# Patient Record
Sex: Female | Born: 1950
Health system: Southern US, Community
[De-identification: ages and names within clinical notes are randomized; demographics above are authoritative.]

## PROBLEM LIST (undated history)

## (undated) DIAGNOSIS — K08409 Partial loss of teeth, unspecified cause, unspecified class: Secondary | ICD-10-CM

## (undated) DIAGNOSIS — E119 Type 2 diabetes mellitus without complications: Secondary | ICD-10-CM

## (undated) DIAGNOSIS — K759 Inflammatory liver disease, unspecified: Secondary | ICD-10-CM

## (undated) DIAGNOSIS — C439 Malignant melanoma of skin, unspecified: Secondary | ICD-10-CM

## (undated) DIAGNOSIS — I4891 Unspecified atrial fibrillation: Secondary | ICD-10-CM

## (undated) DIAGNOSIS — G4730 Sleep apnea, unspecified: Secondary | ICD-10-CM

## (undated) DIAGNOSIS — E782 Mixed hyperlipidemia: Secondary | ICD-10-CM

## (undated) DIAGNOSIS — I6529 Occlusion and stenosis of unspecified carotid artery: Secondary | ICD-10-CM

## (undated) DIAGNOSIS — B019 Varicella without complication: Secondary | ICD-10-CM

## (undated) DIAGNOSIS — Q309 Congenital malformation of nose, unspecified: Secondary | ICD-10-CM

## (undated) DIAGNOSIS — I10 Essential (primary) hypertension: Secondary | ICD-10-CM

## (undated) DIAGNOSIS — Q308 Other congenital malformations of nose: Secondary | ICD-10-CM

## (undated) DIAGNOSIS — M1990 Unspecified osteoarthritis, unspecified site: Secondary | ICD-10-CM

## (undated) DIAGNOSIS — E039 Hypothyroidism, unspecified: Secondary | ICD-10-CM

## (undated) DIAGNOSIS — I499 Cardiac arrhythmia, unspecified: Secondary | ICD-10-CM

## (undated) DIAGNOSIS — T4145XA Adverse effect of unspecified anesthetic, initial encounter: Secondary | ICD-10-CM

## (undated) DIAGNOSIS — T8859XA Other complications of anesthesia, initial encounter: Secondary | ICD-10-CM

## (undated) DIAGNOSIS — C801 Malignant (primary) neoplasm, unspecified: Secondary | ICD-10-CM

## (undated) HISTORY — DX: Mixed hyperlipidemia: E78.2

## (undated) HISTORY — DX: Varicella without complication: B01.9

## (undated) HISTORY — PX: FOOT SURGERY: SHX648

## (undated) HISTORY — DX: Occlusion and stenosis of unspecified carotid artery: I65.29

## (undated) HISTORY — DX: Unspecified atrial fibrillation: I48.91

## (undated) HISTORY — DX: Partial loss of teeth, unspecified cause, unspecified class: K08.409

## (undated) HISTORY — PX: ABDOMINAL WOUND DEHISCENCE: SHX540

## (undated) HISTORY — DX: Malignant melanoma of skin, unspecified: C43.9

## (undated) HISTORY — PX: COLON SURGERY: SHX602

## (undated) HISTORY — PX: MELANOMA EXCISION: SHX5266

---

## 1998-04-10 HISTORY — PX: CHOLECYSTECTOMY: SHX55

## 1998-05-26 ENCOUNTER — Other Ambulatory Visit: Admission: RE | Admit: 1998-05-26 | Discharge: 1998-05-26 | Payer: Self-pay | Admitting: Obstetrics & Gynecology

## 1999-05-02 ENCOUNTER — Other Ambulatory Visit: Admission: RE | Admit: 1999-05-02 | Discharge: 1999-05-02 | Payer: Self-pay | Admitting: Obstetrics & Gynecology

## 1999-12-12 DIAGNOSIS — C189 Malignant neoplasm of colon, unspecified: Secondary | ICD-10-CM

## 1999-12-12 HISTORY — PX: COLON RESECTION: SHX5231

## 1999-12-12 HISTORY — DX: Malignant neoplasm of colon, unspecified: C18.9

## 2000-08-22 ENCOUNTER — Other Ambulatory Visit: Admission: RE | Admit: 2000-08-22 | Discharge: 2000-08-22 | Payer: Self-pay | Admitting: Obstetrics & Gynecology

## 2000-09-10 DIAGNOSIS — D270 Benign neoplasm of right ovary: Secondary | ICD-10-CM

## 2000-09-10 HISTORY — PX: ABDOMINAL HYSTERECTOMY: SHX81

## 2000-09-10 HISTORY — DX: Benign neoplasm of right ovary: D27.0

## 2000-09-24 ENCOUNTER — Other Ambulatory Visit: Admission: RE | Admit: 2000-09-24 | Discharge: 2000-09-24 | Payer: Self-pay | Admitting: Obstetrics and Gynecology

## 2000-09-26 ENCOUNTER — Encounter: Payer: Self-pay | Admitting: Gynecology

## 2000-09-26 ENCOUNTER — Ambulatory Visit: Admission: RE | Admit: 2000-09-26 | Discharge: 2000-09-26 | Payer: Self-pay | Admitting: Gynecology

## 2000-10-02 ENCOUNTER — Inpatient Hospital Stay (HOSPITAL_COMMUNITY): Admission: RE | Admit: 2000-10-02 | Discharge: 2000-10-05 | Payer: Self-pay | Admitting: Gynecology

## 2003-07-20 ENCOUNTER — Encounter: Payer: Self-pay | Admitting: Orthopedic Surgery

## 2003-07-20 ENCOUNTER — Ambulatory Visit (HOSPITAL_COMMUNITY): Admission: RE | Admit: 2003-07-20 | Discharge: 2003-07-20 | Payer: Self-pay | Admitting: Orthopedic Surgery

## 2005-07-27 ENCOUNTER — Ambulatory Visit: Payer: Self-pay | Admitting: Internal Medicine

## 2006-08-06 ENCOUNTER — Ambulatory Visit: Payer: Self-pay | Admitting: Specialist

## 2006-08-09 ENCOUNTER — Other Ambulatory Visit: Payer: Self-pay

## 2006-08-11 HISTORY — PX: KNEE ARTHROSCOPY: SHX127

## 2006-08-23 ENCOUNTER — Ambulatory Visit: Payer: Self-pay | Admitting: Specialist

## 2008-12-08 ENCOUNTER — Ambulatory Visit: Payer: Self-pay | Admitting: General Practice

## 2009-11-12 ENCOUNTER — Ambulatory Visit: Payer: Self-pay | Admitting: Internal Medicine

## 2009-12-31 ENCOUNTER — Ambulatory Visit: Payer: Self-pay | Admitting: Internal Medicine

## 2010-01-07 ENCOUNTER — Ambulatory Visit: Payer: Self-pay | Admitting: Internal Medicine

## 2012-08-16 DIAGNOSIS — I059 Rheumatic mitral valve disease, unspecified: Secondary | ICD-10-CM | POA: Insufficient documentation

## 2012-12-11 HISTORY — PX: CARDIOVERSION: SHX1299

## 2014-07-15 DIAGNOSIS — C4359 Malignant melanoma of other part of trunk: Secondary | ICD-10-CM | POA: Diagnosis not present

## 2014-07-15 DIAGNOSIS — D485 Neoplasm of uncertain behavior of skin: Secondary | ICD-10-CM | POA: Diagnosis not present

## 2014-08-27 DIAGNOSIS — Z1231 Encounter for screening mammogram for malignant neoplasm of breast: Secondary | ICD-10-CM | POA: Diagnosis not present

## 2014-09-24 DIAGNOSIS — E782 Mixed hyperlipidemia: Secondary | ICD-10-CM | POA: Diagnosis not present

## 2014-09-24 DIAGNOSIS — E039 Hypothyroidism, unspecified: Secondary | ICD-10-CM | POA: Diagnosis not present

## 2014-09-24 DIAGNOSIS — E781 Pure hyperglyceridemia: Secondary | ICD-10-CM | POA: Diagnosis not present

## 2014-09-24 DIAGNOSIS — E1165 Type 2 diabetes mellitus with hyperglycemia: Secondary | ICD-10-CM | POA: Diagnosis not present

## 2014-09-24 DIAGNOSIS — I10 Essential (primary) hypertension: Secondary | ICD-10-CM | POA: Diagnosis not present

## 2014-10-08 DIAGNOSIS — Z23 Encounter for immunization: Secondary | ICD-10-CM | POA: Diagnosis not present

## 2014-10-12 DIAGNOSIS — Z23 Encounter for immunization: Secondary | ICD-10-CM | POA: Diagnosis not present

## 2014-11-25 DIAGNOSIS — H6092 Unspecified otitis externa, left ear: Secondary | ICD-10-CM | POA: Diagnosis not present

## 2014-11-25 DIAGNOSIS — Z1389 Encounter for screening for other disorder: Secondary | ICD-10-CM | POA: Diagnosis not present

## 2014-11-30 DIAGNOSIS — L570 Actinic keratosis: Secondary | ICD-10-CM | POA: Diagnosis not present

## 2014-11-30 DIAGNOSIS — Z08 Encounter for follow-up examination after completed treatment for malignant neoplasm: Secondary | ICD-10-CM | POA: Diagnosis not present

## 2014-11-30 DIAGNOSIS — Z85820 Personal history of malignant melanoma of skin: Secondary | ICD-10-CM | POA: Diagnosis not present

## 2014-11-30 DIAGNOSIS — D225 Melanocytic nevi of trunk: Secondary | ICD-10-CM | POA: Diagnosis not present

## 2014-12-14 DIAGNOSIS — Z794 Long term (current) use of insulin: Secondary | ICD-10-CM | POA: Diagnosis not present

## 2014-12-14 DIAGNOSIS — E782 Mixed hyperlipidemia: Secondary | ICD-10-CM | POA: Diagnosis not present

## 2014-12-14 DIAGNOSIS — E1165 Type 2 diabetes mellitus with hyperglycemia: Secondary | ICD-10-CM | POA: Diagnosis not present

## 2014-12-21 DIAGNOSIS — B349 Viral infection, unspecified: Secondary | ICD-10-CM | POA: Diagnosis not present

## 2014-12-21 DIAGNOSIS — H9202 Otalgia, left ear: Secondary | ICD-10-CM | POA: Diagnosis not present

## 2014-12-29 DIAGNOSIS — H9202 Otalgia, left ear: Secondary | ICD-10-CM | POA: Diagnosis not present

## 2014-12-29 DIAGNOSIS — M2660 Temporomandibular joint disorder, unspecified: Secondary | ICD-10-CM | POA: Diagnosis not present

## 2015-03-08 DIAGNOSIS — M21961 Unspecified acquired deformity of right lower leg: Secondary | ICD-10-CM | POA: Diagnosis not present

## 2015-03-08 DIAGNOSIS — M2042 Other hammer toe(s) (acquired), left foot: Secondary | ICD-10-CM | POA: Diagnosis not present

## 2015-03-08 DIAGNOSIS — M21962 Unspecified acquired deformity of left lower leg: Secondary | ICD-10-CM | POA: Diagnosis not present

## 2015-03-08 DIAGNOSIS — Z9889 Other specified postprocedural states: Secondary | ICD-10-CM | POA: Diagnosis not present

## 2015-03-31 DIAGNOSIS — M2042 Other hammer toe(s) (acquired), left foot: Secondary | ICD-10-CM | POA: Diagnosis not present

## 2015-03-31 DIAGNOSIS — M24575 Contracture, left foot: Secondary | ICD-10-CM | POA: Diagnosis not present

## 2015-03-31 DIAGNOSIS — E119 Type 2 diabetes mellitus without complications: Secondary | ICD-10-CM | POA: Diagnosis not present

## 2015-03-31 DIAGNOSIS — I10 Essential (primary) hypertension: Secondary | ICD-10-CM | POA: Diagnosis not present

## 2015-03-31 DIAGNOSIS — Z8679 Personal history of other diseases of the circulatory system: Secondary | ICD-10-CM | POA: Diagnosis not present

## 2015-04-14 DIAGNOSIS — E1165 Type 2 diabetes mellitus with hyperglycemia: Secondary | ICD-10-CM | POA: Diagnosis not present

## 2015-04-21 DIAGNOSIS — E782 Mixed hyperlipidemia: Secondary | ICD-10-CM | POA: Diagnosis not present

## 2015-04-21 DIAGNOSIS — I482 Chronic atrial fibrillation: Secondary | ICD-10-CM | POA: Diagnosis not present

## 2015-04-21 DIAGNOSIS — I10 Essential (primary) hypertension: Secondary | ICD-10-CM | POA: Diagnosis not present

## 2015-04-21 DIAGNOSIS — Z794 Long term (current) use of insulin: Secondary | ICD-10-CM | POA: Diagnosis not present

## 2015-04-21 DIAGNOSIS — I480 Paroxysmal atrial fibrillation: Secondary | ICD-10-CM | POA: Diagnosis not present

## 2015-04-21 DIAGNOSIS — E119 Type 2 diabetes mellitus without complications: Secondary | ICD-10-CM | POA: Diagnosis not present

## 2015-05-05 DIAGNOSIS — J45909 Unspecified asthma, uncomplicated: Secondary | ICD-10-CM | POA: Diagnosis not present

## 2015-05-07 DIAGNOSIS — R05 Cough: Secondary | ICD-10-CM | POA: Diagnosis not present

## 2015-05-07 DIAGNOSIS — J209 Acute bronchitis, unspecified: Secondary | ICD-10-CM | POA: Diagnosis not present

## 2015-05-07 DIAGNOSIS — R5383 Other fatigue: Secondary | ICD-10-CM | POA: Diagnosis not present

## 2015-05-07 DIAGNOSIS — R062 Wheezing: Secondary | ICD-10-CM | POA: Diagnosis not present

## 2015-06-02 DIAGNOSIS — H2513 Age-related nuclear cataract, bilateral: Secondary | ICD-10-CM | POA: Diagnosis not present

## 2015-06-02 DIAGNOSIS — H43811 Vitreous degeneration, right eye: Secondary | ICD-10-CM | POA: Diagnosis not present

## 2015-06-02 DIAGNOSIS — E119 Type 2 diabetes mellitus without complications: Secondary | ICD-10-CM | POA: Diagnosis not present

## 2015-06-08 DIAGNOSIS — L814 Other melanin hyperpigmentation: Secondary | ICD-10-CM | POA: Diagnosis not present

## 2015-06-08 DIAGNOSIS — Z08 Encounter for follow-up examination after completed treatment for malignant neoplasm: Secondary | ICD-10-CM | POA: Diagnosis not present

## 2015-06-08 DIAGNOSIS — Z85820 Personal history of malignant melanoma of skin: Secondary | ICD-10-CM | POA: Diagnosis not present

## 2015-06-08 DIAGNOSIS — L821 Other seborrheic keratosis: Secondary | ICD-10-CM | POA: Diagnosis not present

## 2015-07-05 DIAGNOSIS — Z09 Encounter for follow-up examination after completed treatment for conditions other than malignant neoplasm: Secondary | ICD-10-CM | POA: Diagnosis not present

## 2015-07-05 DIAGNOSIS — Z9889 Other specified postprocedural states: Secondary | ICD-10-CM | POA: Diagnosis not present

## 2015-07-05 DIAGNOSIS — M7732 Calcaneal spur, left foot: Secondary | ICD-10-CM | POA: Diagnosis not present

## 2015-07-14 DIAGNOSIS — Z79899 Other long term (current) drug therapy: Secondary | ICD-10-CM | POA: Diagnosis not present

## 2015-07-14 DIAGNOSIS — Z8679 Personal history of other diseases of the circulatory system: Secondary | ICD-10-CM | POA: Diagnosis not present

## 2015-07-14 DIAGNOSIS — Z85030 Personal history of malignant carcinoid tumor of large intestine: Secondary | ICD-10-CM | POA: Diagnosis not present

## 2015-07-14 DIAGNOSIS — I10 Essential (primary) hypertension: Secondary | ICD-10-CM | POA: Diagnosis not present

## 2015-07-14 DIAGNOSIS — Z8739 Personal history of other diseases of the musculoskeletal system and connective tissue: Secondary | ICD-10-CM | POA: Diagnosis not present

## 2015-07-14 DIAGNOSIS — M2042 Other hammer toe(s) (acquired), left foot: Secondary | ICD-10-CM | POA: Diagnosis not present

## 2015-08-06 DIAGNOSIS — M25561 Pain in right knee: Secondary | ICD-10-CM | POA: Diagnosis not present

## 2015-08-23 DIAGNOSIS — Z9889 Other specified postprocedural states: Secondary | ICD-10-CM | POA: Diagnosis not present

## 2015-08-23 DIAGNOSIS — M79672 Pain in left foot: Secondary | ICD-10-CM | POA: Diagnosis not present

## 2015-08-23 DIAGNOSIS — Z09 Encounter for follow-up examination after completed treatment for conditions other than malignant neoplasm: Secondary | ICD-10-CM | POA: Diagnosis not present

## 2015-09-02 DIAGNOSIS — M1711 Unilateral primary osteoarthritis, right knee: Secondary | ICD-10-CM | POA: Diagnosis not present

## 2015-09-06 DIAGNOSIS — I10 Essential (primary) hypertension: Secondary | ICD-10-CM | POA: Diagnosis not present

## 2015-09-06 DIAGNOSIS — Z794 Long term (current) use of insulin: Secondary | ICD-10-CM | POA: Diagnosis not present

## 2015-09-06 DIAGNOSIS — E1165 Type 2 diabetes mellitus with hyperglycemia: Secondary | ICD-10-CM | POA: Diagnosis not present

## 2015-09-06 DIAGNOSIS — Z23 Encounter for immunization: Secondary | ICD-10-CM | POA: Diagnosis not present

## 2015-09-06 DIAGNOSIS — E039 Hypothyroidism, unspecified: Secondary | ICD-10-CM | POA: Diagnosis not present

## 2015-09-06 DIAGNOSIS — Z85038 Personal history of other malignant neoplasm of large intestine: Secondary | ICD-10-CM | POA: Diagnosis not present

## 2015-09-06 DIAGNOSIS — E782 Mixed hyperlipidemia: Secondary | ICD-10-CM | POA: Diagnosis not present

## 2015-12-27 DIAGNOSIS — I482 Chronic atrial fibrillation: Secondary | ICD-10-CM | POA: Diagnosis not present

## 2015-12-27 DIAGNOSIS — I059 Rheumatic mitral valve disease, unspecified: Secondary | ICD-10-CM | POA: Diagnosis not present

## 2015-12-27 DIAGNOSIS — I10 Essential (primary) hypertension: Secondary | ICD-10-CM | POA: Diagnosis not present

## 2015-12-27 DIAGNOSIS — R0602 Shortness of breath: Secondary | ICD-10-CM | POA: Diagnosis not present

## 2015-12-28 DIAGNOSIS — M1711 Unilateral primary osteoarthritis, right knee: Secondary | ICD-10-CM | POA: Diagnosis not present

## 2016-01-04 DIAGNOSIS — I059 Rheumatic mitral valve disease, unspecified: Secondary | ICD-10-CM | POA: Diagnosis not present

## 2016-01-04 DIAGNOSIS — I482 Chronic atrial fibrillation: Secondary | ICD-10-CM | POA: Diagnosis not present

## 2016-01-06 ENCOUNTER — Ambulatory Visit: Payer: Self-pay | Admitting: Orthopedic Surgery

## 2016-01-06 DIAGNOSIS — Z01818 Encounter for other preprocedural examination: Secondary | ICD-10-CM | POA: Diagnosis not present

## 2016-01-06 DIAGNOSIS — E782 Mixed hyperlipidemia: Secondary | ICD-10-CM | POA: Diagnosis not present

## 2016-01-06 DIAGNOSIS — Z7984 Long term (current) use of oral hypoglycemic drugs: Secondary | ICD-10-CM | POA: Diagnosis not present

## 2016-01-06 DIAGNOSIS — I10 Essential (primary) hypertension: Secondary | ICD-10-CM | POA: Diagnosis not present

## 2016-01-06 DIAGNOSIS — G4733 Obstructive sleep apnea (adult) (pediatric): Secondary | ICD-10-CM | POA: Diagnosis not present

## 2016-01-06 DIAGNOSIS — E039 Hypothyroidism, unspecified: Secondary | ICD-10-CM | POA: Diagnosis not present

## 2016-01-06 DIAGNOSIS — I482 Chronic atrial fibrillation: Secondary | ICD-10-CM | POA: Diagnosis not present

## 2016-01-06 DIAGNOSIS — E1165 Type 2 diabetes mellitus with hyperglycemia: Secondary | ICD-10-CM | POA: Diagnosis not present

## 2016-01-06 DIAGNOSIS — R0989 Other specified symptoms and signs involving the circulatory and respiratory systems: Secondary | ICD-10-CM | POA: Diagnosis not present

## 2016-01-06 DIAGNOSIS — Z794 Long term (current) use of insulin: Secondary | ICD-10-CM | POA: Diagnosis not present

## 2016-01-06 NOTE — Progress Notes (Signed)
Preoperative surgical orders have been place into the Epic hospital system for Ann [name] on 01/06/2016, 10:26 AM  by Mickel Crow for surgery on 01-24-2016.  Preop Total Knee orders including Experal, IV Tylenol, and IV Decadron as long as there are no contraindications to the above medications. Arlee Muslim, PA-C

## 2016-01-10 ENCOUNTER — Other Ambulatory Visit (HOSPITAL_COMMUNITY): Payer: Self-pay | Admitting: Family Medicine

## 2016-01-10 DIAGNOSIS — R0989 Other specified symptoms and signs involving the circulatory and respiratory systems: Secondary | ICD-10-CM

## 2016-01-12 DIAGNOSIS — E782 Mixed hyperlipidemia: Secondary | ICD-10-CM | POA: Diagnosis not present

## 2016-01-12 DIAGNOSIS — M79604 Pain in right leg: Secondary | ICD-10-CM | POA: Diagnosis not present

## 2016-01-12 DIAGNOSIS — I482 Chronic atrial fibrillation: Secondary | ICD-10-CM | POA: Diagnosis not present

## 2016-01-12 DIAGNOSIS — I10 Essential (primary) hypertension: Secondary | ICD-10-CM | POA: Diagnosis not present

## 2016-01-12 DIAGNOSIS — Z6841 Body Mass Index (BMI) 40.0 and over, adult: Secondary | ICD-10-CM | POA: Diagnosis not present

## 2016-01-17 ENCOUNTER — Ambulatory Visit (HOSPITAL_COMMUNITY)
Admission: RE | Admit: 2016-01-17 | Discharge: 2016-01-17 | Disposition: A | Discharge status: Home / Self Care | Payer: Medicare Other | Source: Ambulatory Visit | Attending: Cardiovascular Disease | Admitting: Cardiovascular Disease

## 2016-01-17 DIAGNOSIS — R0989 Other specified symptoms and signs involving the circulatory and respiratory systems: Secondary | ICD-10-CM | POA: Diagnosis not present

## 2016-01-17 NOTE — Patient Instructions (Addendum)
Ann [name]  01/17/2016   Your procedure is scheduled on: 01/24/2016    Report to [facility] Main  Entrance take [place]  elevators to 3rd floor to  [place] at   [place] AM.  Call this number if you have problems the morning of surgery [phone]   Remember: ONLY 1 PERSON MAY GO WITH YOU TO SHORT STAY TO GET  READY MORNING OF [place].  Do not eat food or drink liquids :After Midnight.             Eat a good healthy snack prior to bedtime.      Take these medicines the morning of surgery with A SIP OF WATER: Levothyroxine. Metoprolol .No Lantus or diabetic meds. DO NOT TAKE ANY DIABETIC MEDICATIONS DAY OF YOUR SURGERY                               You may not have any metal on your body including hair pins and              piercings  Do not wear jewelry, make-up, lotions, powders or perfumes, deodorant             Do not wear nail polish.  Do not shave  48 hours prior to surgery.               Do not bring valuables to the hospital. [place].  Contacts, dentures or bridgework may not be worn into surgery.  Leave suitcase in the car. After surgery it may be brought to your room.        Special Instructions: coughing and deep breathing exercises, leg exercises               Please read over the following fact sheets you were given: _____________________________________________________________________             [facility] - Preparing for Surgery Before surgery, you can play an important role.  Because skin is not sterile, your skin needs to be as free of germs as possible.  You can reduce the number of germs on your skin by washing with CHG (chlorahexidine gluconate) soap before surgery.  CHG is an antiseptic cleaner which kills germs and bonds with the skin to continue killing germs even after washing. Please DO NOT use if you have an allergy to CHG or antibacterial soaps.  If your skin  becomes reddened/irritated stop using the CHG and inform your nurse when you arrive at Short Stay. Do not shave (including legs and underarms) for at least 48 hours prior to the first CHG shower.  You may shave your face/neck. Please follow these instructions carefully:  1.  Shower with CHG Soap the night before surgery and the  morning of Surgery.  2.  If you choose to wash your hair, wash your hair first as usual with your  normal  shampoo.  3.  After you shampoo, rinse your hair and body thoroughly to remove the  shampoo.                           4.  Use CHG as you would any other liquid soap.  You  can apply chg directly  to the skin and wash                       Gently with a scrungie or clean washcloth.  5.  Apply the CHG Soap to your body ONLY FROM THE NECK DOWN.   Do not use on face/ open                           Wound or open sores. Avoid contact with eyes, ears mouth and genitals (private parts).                       Wash face,  Genitals (private parts) with your normal soap.             6.  Wash thoroughly, paying special attention to the area where your surgery  will be performed.  7.  Thoroughly rinse your body with warm water from the neck down.  8.  DO NOT shower/wash with your normal soap after using and rinsing off  the CHG Soap.                9.  Pat yourself dry with a clean towel.            10.  Wear clean pajamas.            11.  Place clean sheets on your bed the night of your first shower and do not  sleep with pets. Day of Surgery : Do not apply any lotions/deodorants the morning of surgery.  Please wear clean clothes to the hospital/surgery center.  FAILURE TO FOLLOW THESE INSTRUCTIONS MAY RESULT IN THE CANCELLATION OF YOUR SURGERY PATIENT SIGNATURE_________________________________  NURSE SIGNATURE__________________________________  ________________________________________________________________________  WHAT IS A BLOOD TRANSFUSION? Blood Transfusion  Information  A transfusion is the replacement of blood or some of its parts. Blood is made up of multiple cells which provide different functions.  Red blood cells carry oxygen and are used for blood loss replacement.  White blood cells fight against infection.  Platelets control bleeding.  Plasma helps clot blood.  Other blood products are available for specialized needs, such as hemophilia or other clotting disorders. BEFORE THE TRANSFUSION  Who gives blood for transfusions?   Healthy volunteers who are fully evaluated to make sure their blood is safe. This is blood bank blood. Transfusion therapy is the safest it has ever been in the practice of medicine. Before blood is taken from a donor, a complete history is taken to make sure that person has no history of diseases nor engages in risky social behavior (examples are intravenous drug use or sexual activity with multiple partners). The donor's travel history is screened to minimize risk of transmitting infections, such as malaria. The donated blood is tested for signs of infectious diseases, such as HIV and hepatitis. The blood is then tested to be sure it is compatible with you in order to minimize the chance of a transfusion reaction. If you or a relative donates blood, this is often done in anticipation of surgery and is not appropriate for emergency situations. It takes many days to process the donated blood. RISKS AND COMPLICATIONS Although transfusion therapy is very safe and saves many lives, the main dangers of transfusion include:  1. Getting an infectious disease. 2. Developing a transfusion reaction. This is an allergic reaction to something in the blood you were given. Every  precaution is taken to prevent this. The decision to have a blood transfusion has been considered carefully by your caregiver before blood is given. Blood is not given unless the benefits outweigh the risks. AFTER THE TRANSFUSION  Right after receiving a  blood transfusion, you will usually feel much better and more energetic. This is especially true if your red blood cells have gotten low (anemic). The transfusion raises the level of the red blood cells which carry oxygen, and this usually causes an energy increase.  The nurse administering the transfusion will monitor you carefully for complications. HOME CARE INSTRUCTIONS  No special instructions are needed after a transfusion. You may find your energy is better. Speak with your caregiver about any limitations on activity for underlying diseases you may have. SEEK MEDICAL CARE IF:   Your condition is not improving after your transfusion.  You develop redness or irritation at the intravenous (IV) site. SEEK IMMEDIATE MEDICAL CARE IF:  Any of the following symptoms occur over the next 12 hours:  Shaking chills.  You have a temperature by mouth above 102 F (38.9 C), not controlled by medicine.  Chest, back, or muscle pain.  People around you feel you are not acting correctly or are confused.  Shortness of breath or difficulty breathing.  Dizziness and fainting.  You get a rash or develop hives.  You have a decrease in urine output.  Your urine turns a dark color or changes to pink, red, or brown. Any of the following symptoms occur over the next 10 days:  You have a temperature by mouth above 102 F (38.9 C), not controlled by medicine.  Shortness of breath.  Weakness after normal activity.  The white part of the eye turns yellow (jaundice).  You have a decrease in the amount of urine or are urinating less often.  Your urine turns a dark color or changes to pink, red, or brown. Document Released: 11/24/2000 Document Revised: 02/19/2012 Document Reviewed: 07/13/2008 ExitCare Patient Information 2014 [place].  _______________________________________________________________________  Incentive Spirometer  An incentive spirometer is a tool that can help keep  your lungs clear and active. This tool measures how well you are filling your lungs with each breath. Taking long deep breaths may help reverse or decrease the chance of developing breathing (pulmonary) problems (especially infection) following:  A long period of time when you are unable to move or be active. BEFORE THE PROCEDURE   If the spirometer includes an indicator to show your best effort, your nurse or respiratory therapist will set it to a desired goal.  If possible, sit up straight or lean slightly forward. Try not to slouch.  Hold the incentive spirometer in an upright position. INSTRUCTIONS FOR USE  3. Sit on the edge of your bed if possible, or sit up as far as you can in bed or on a chair. 4. Hold the incentive spirometer in an upright position. 5. Breathe out normally. 6. Place the mouthpiece in your mouth and seal your lips tightly around it. 7. Breathe in slowly and as deeply as possible, raising the piston or the ball toward the top of the column. 8. Hold your breath for 3-5 seconds or for as long as possible. Allow the piston or ball to fall to the bottom of the column. 9. Remove the mouthpiece from your mouth and breathe out normally. 10. Rest for a few seconds and repeat Steps 1 through 7 at least 10 times every 1-2 hours when you are awake.  Take your time and take a few normal breaths between deep breaths. 11. The spirometer may include an indicator to show your best effort. Use the indicator as a goal to work toward during each repetition. 12. After each set of 10 deep breaths, practice coughing to be sure your lungs are clear. If you have an incision (the cut made at the time of surgery), support your incision when coughing by placing a pillow or rolled up towels firmly against it. Once you are able to get out of bed, walk around indoors and cough well. You may stop using the incentive spirometer when instructed by your caregiver.  RISKS AND COMPLICATIONS  Take your  time so you do not get dizzy or light-headed.  If you are in pain, you may need to take or ask for pain medication before doing incentive spirometry. It is harder to take a deep breath if you are having pain. AFTER USE  Rest and breathe slowly and easily.  It can be helpful to keep track of a log of your progress. Your caregiver can provide you with a simple table to help with this. If you are using the spirometer at home, follow these instructions: [place] IF:   You are having difficultly using the spirometer.  You have trouble using the spirometer as often as instructed.  Your pain medication is not giving enough relief while using the spirometer.  You develop fever of 100.5 F (38.1 C) or higher. SEEK IMMEDIATE MEDICAL CARE IF:   You cough up bloody sputum that had not been present before.  You develop fever of 102 F (38.9 C) or greater.  You develop worsening pain at or near the incision site. MAKE SURE YOU:   Understand these instructions.  Will watch your condition.  Will get help right away if you are not doing well or get worse. Document Released: 04/09/2007 Document Revised: 02/19/2012 Document Reviewed: 06/10/2007 [facility] Patient Information 2014 [place], [place].   ________________________________________________________________________

## 2016-01-18 ENCOUNTER — Encounter (HOSPITAL_COMMUNITY): Payer: Self-pay

## 2016-01-18 ENCOUNTER — Encounter (HOSPITAL_COMMUNITY)
Admission: RE | Admit: 2016-01-18 | Discharge: 2016-01-18 | Disposition: A | Discharge status: Home / Self Care | Payer: Medicare Other | Source: Ambulatory Visit | Attending: Orthopedic Surgery | Admitting: Orthopedic Surgery

## 2016-01-18 DIAGNOSIS — Z0183 Encounter for blood typing: Secondary | ICD-10-CM | POA: Insufficient documentation

## 2016-01-18 DIAGNOSIS — E039 Hypothyroidism, unspecified: Secondary | ICD-10-CM | POA: Diagnosis not present

## 2016-01-18 DIAGNOSIS — Z01818 Encounter for other preprocedural examination: Secondary | ICD-10-CM | POA: Diagnosis not present

## 2016-01-18 DIAGNOSIS — G4730 Sleep apnea, unspecified: Secondary | ICD-10-CM | POA: Insufficient documentation

## 2016-01-18 DIAGNOSIS — Z01812 Encounter for preprocedural laboratory examination: Secondary | ICD-10-CM | POA: Diagnosis not present

## 2016-01-18 DIAGNOSIS — Z87891 Personal history of nicotine dependence: Secondary | ICD-10-CM | POA: Diagnosis not present

## 2016-01-18 DIAGNOSIS — E119 Type 2 diabetes mellitus without complications: Secondary | ICD-10-CM | POA: Insufficient documentation

## 2016-01-18 DIAGNOSIS — Z794 Long term (current) use of insulin: Secondary | ICD-10-CM | POA: Insufficient documentation

## 2016-01-18 DIAGNOSIS — I10 Essential (primary) hypertension: Secondary | ICD-10-CM | POA: Insufficient documentation

## 2016-01-18 DIAGNOSIS — I4891 Unspecified atrial fibrillation: Secondary | ICD-10-CM | POA: Insufficient documentation

## 2016-01-18 DIAGNOSIS — Z79899 Other long term (current) drug therapy: Secondary | ICD-10-CM | POA: Diagnosis not present

## 2016-01-18 DIAGNOSIS — M1711 Unilateral primary osteoarthritis, right knee: Secondary | ICD-10-CM | POA: Insufficient documentation

## 2016-01-18 DIAGNOSIS — Z7902 Long term (current) use of antithrombotics/antiplatelets: Secondary | ICD-10-CM | POA: Insufficient documentation

## 2016-01-18 HISTORY — DX: Type 2 diabetes mellitus without complications: E11.9

## 2016-01-18 HISTORY — DX: Other complications of anesthesia, initial encounter: T88.59XA

## 2016-01-18 HISTORY — DX: Sleep apnea, unspecified: G47.30

## 2016-01-18 HISTORY — DX: Adverse effect of unspecified anesthetic, initial encounter: T41.45XA

## 2016-01-18 HISTORY — DX: Inflammatory liver disease, unspecified: K75.9

## 2016-01-18 HISTORY — DX: Unspecified osteoarthritis, unspecified site: M19.90

## 2016-01-18 HISTORY — DX: Hypothyroidism, unspecified: E03.9

## 2016-01-18 HISTORY — DX: Malignant (primary) neoplasm, unspecified: C80.1

## 2016-01-18 HISTORY — DX: Other congenital malformations of nose: Q30.8

## 2016-01-18 HISTORY — DX: Cardiac arrhythmia, unspecified: I49.9

## 2016-01-18 HISTORY — DX: Essential (primary) hypertension: I10

## 2016-01-18 HISTORY — DX: Congenital malformation of nose, unspecified: Q30.9

## 2016-01-18 LAB — PROTIME-INR
INR: 1.11 (ref 0.00–1.49)
PROTHROMBIN TIME: 14.5 s (ref 11.6–15.2)

## 2016-01-18 LAB — URINALYSIS, ROUTINE W REFLEX MICROSCOPIC
BILIRUBIN URINE: NEGATIVE
GLUCOSE, UA: NEGATIVE mg/dL
HGB URINE DIPSTICK: NEGATIVE
KETONES UR: NEGATIVE mg/dL
Leukocytes, UA: NEGATIVE
Nitrite: NEGATIVE
PH: 5.5 (ref 5.0–8.0)
Protein, ur: NEGATIVE mg/dL
Specific Gravity, Urine: 1.017 (ref 1.005–1.030)

## 2016-01-18 LAB — COMPREHENSIVE METABOLIC PANEL
ALT: 28 U/L (ref 14–54)
AST: 29 U/L (ref 15–41)
Albumin: 4.1 g/dL (ref 3.5–5.0)
Alkaline Phosphatase: 74 U/L (ref 38–126)
Anion gap: 11 (ref 5–15)
BUN: 16 mg/dL (ref 6–20)
CHLORIDE: 102 mmol/L (ref 101–111)
CO2: 26 mmol/L (ref 22–32)
Calcium: 9.3 mg/dL (ref 8.9–10.3)
Creatinine, Ser: 0.87 mg/dL (ref 0.44–1.00)
Glucose, Bld: 200 mg/dL — ABNORMAL HIGH (ref 65–99)
POTASSIUM: 3.9 mmol/L (ref 3.5–5.1)
SODIUM: 139 mmol/L (ref 135–145)
Total Bilirubin: 0.7 mg/dL (ref 0.3–1.2)
Total Protein: 7.5 g/dL (ref 6.5–8.1)

## 2016-01-18 LAB — SURGICAL PCR SCREEN
MRSA, PCR: NEGATIVE
STAPHYLOCOCCUS AUREUS: POSITIVE — AB

## 2016-01-18 LAB — APTT: APTT: 39 s — AB (ref 24–37)

## 2016-01-18 LAB — ABO/RH: ABO/RH(D): O POS

## 2016-01-18 NOTE — Consult Note (Signed)
Saw pt pre op.  Records reviewed. Recent Cardiology visit with permission for stopping her Pradaxa before surgery so SAB can be donel.

## 2016-01-18 NOTE — Pre-Procedure Instructions (Addendum)
01-18-16 EKG 1'17,Stress 1'17, Echo 1'17, Hgb A1C 01-06-16 ,CBC/d,BMP, with chart. LOV-Dr. [place] on chart.. Clearance note with chart-Dr. [place]ow[place],cardiology. Dr. Waldo Laine in to visit for preop consult, pt questions answered.

## 2016-01-19 NOTE — Pre-Procedure Instructions (Signed)
01-19-16 0815 Pt. Notified of Positive Staph aureus by PCR- will use Mupirocin as directed. Note to Dr. Anne Fu office to inform of this [phone].

## 2016-01-19 NOTE — H&P (Signed)
TOTAL KNEE ADMISSION H&P  Patient is being admitted for right total knee arthroplasty.  Subjective:  Chief Complaint:right knee pain.  HPI: Ann [name], 65 y.o. female, has a history of pain and functional disability in the right knee due to arthritis and has failed non-surgical conservative treatments for greater than 12 weeks to includeNSAID's and/or analgesics, corticosteriod injections, use of assistive devices and activity modification.  Onset of symptoms was gradual, starting 5 years ago with gradually worsening course since that time. The patient noted no past surgery on the right knee(s).  Patient currently rates pain in the right knee(s) at 8 out of 10 with activity. Patient has night pain, worsening of pain with activity and weight bearing, pain that interferes with activities of daily living, pain with passive range of motion, crepitus and joint swelling.  Patient has evidence of periarticular osteophytes and joint space narrowing by imaging studies.  There is no active infection.   Past Medical History  Diagnosis Date  . Complication of anesthesia     sensitive to narcotics"extra sleepy". Versed- "no memory recall for 4 days postop"  . Cancer [facility])     Colon cancer '01, tx. radiation, chemo-  [facility]  . Dysrhythmia     hx. Atrial Fibrillation"chronic"- Dr. [facility])  . Hypertension   . Sleep apnea     unable to tolreate Bipap.  [place] Hypothyroidism   . Arthritis     osteoarthritis- knees.  . Hepatitis     "sub clinical case of Hepatitis in late 70's""has immunities"  . Diabetes mellitus without complication (HCC)     oral and Insulin  . 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    Past Surgical History  Procedure Laterality Date  . Abdominal hysterectomy       [facility]  . Cholecystectomy      '98 -Lapraroscopic  . Colon surgery      "colon cancer"  . Foot surgery Bilateral     multiple "hammer toes  and bunionectomies"  . Knee arthroscopy Left     meniscectomy  . Abdominal wound dehiscence Bilateral     multiple times / mesh use with multiple abdominal hernia repair      Current outpatient prescriptions:  .  cetirizine (ZYRTEC) 10 MG tablet, Take 10 mg by mouth daily as needed for allergies (cough)., Disp: , Rfl:  .  dabigatran (PRADAXA) 150 MG CAPS capsule, Take 150 mg by mouth 2 (two) times daily., Disp: , Rfl:  .  diphenhydrAMINE (BENADRYL) 25 mg capsule, Take 25 mg by mouth every 6 (six) hours as needed for itching., Disp: , Rfl:  .  EPIPEN 2-PAK 0.3 MG/0.3ML SOAJ injection, Inject 0.3 mg into the skin as needed. Bee stings, Disp: , Rfl: 1 .  furosemide (LASIX) 20 MG tablet, Take 20 mg by mouth daily as needed for edema., Disp: , Rfl:  .  insulin aspart (NOVOLOG) 100 UNIT/ML injection, Inject 20-24 Units into the skin 3 (three) times daily before meals. Sliding scale based on blood sugar, Disp: , Rfl:  .  insulin glargine (LANTUS) 100 UNIT/ML injection, Inject 75 Units into the skin every morning. , Disp: , Rfl:  .  levothyroxine (SYNTHROID, LEVOTHROID) 50 MCG tablet, Take 50 mcg by mouth daily before breakfast., Disp: , Rfl:  .  metFORMIN (GLUCOPHAGE-XR) 500 MG 24 hr tablet, Take 500 mg by mouth 2 (two) times daily., Disp: , Rfl:  .  olmesartan-hydrochlorothiazide (BENICAR HCT) 40-25  MG tablet, Take 1 tablet by mouth daily., Disp: , Rfl:  .  ranitidine (ZANTAC) 150 MG tablet, Take 150 mg by mouth at bedtime as needed for heartburn., Disp: , Rfl:  .  zolpidem (AMBIEN) 10 MG tablet, Take 10 mg by mouth at bedtime., Disp: , Rfl:  .  metoprolol (LOPRESSOR) 50 MG tablet, Take 50 mg by mouth 2 (two) times daily., Disp: , Rfl:   Allergies  Allergen Reactions  . Amoxicillin Shortness Of Breath and Swelling    Has patient had a PCN reaction causing immediate rash, facial/tongue/throat swelling, SOB or lightheadedness with hypotension: Yes Has patient had a PCN reaction causing severe rash  involving mucus membranes or skin necrosis: No Has patient had a PCN reaction that required hospitalization No Has patient had a PCN reaction occurring within the last 10 years: Yes If all of the above answers are "NO", then may proceed with Cephalosporin use.   . Codeine Itching    Tolerates hydrocodone  . Statins Other (See Comments)    Muscle aches    Social History  Substance Use Topics  . Smoking status: Former Smoker -- 25 years    Types: Cigarettes    Quit date: 01/17/1997  . Smokeless tobacco: No  . Alcohol Use: No      Review of Systems  Constitutional: Positive for malaise/fatigue. Negative for fever, chills, weight loss and diaphoresis.  HENT: Negative.   Eyes: Negative.   Respiratory: Positive for shortness of breath. Negative for cough, hemoptysis, sputum production and wheezing.   Cardiovascular: Negative.   Gastrointestinal: Negative.   Genitourinary: Positive for frequency. Negative for dysuria, urgency, hematuria and flank pain.  Musculoskeletal: Positive for myalgias, back pain and joint pain. Negative for falls and neck pain.       Right knee pain  Skin: Negative.   Neurological: Positive for weakness. Negative for dizziness, tingling, tremors, sensory change, speech change, focal weakness, seizures and loss of consciousness.  Endo/Heme/Allergies: Negative.   Psychiatric/Behavioral: Negative for depression, suicidal ideas, hallucinations, memory loss and substance abuse. The patient is nervous/anxious. The patient does not have insomnia.     Objective:  Physical Exam  Constitutional: She is oriented to person, place, and time. She appears well-developed. No distress.  Morbidly obese  HENT:  Head: Normocephalic and atraumatic.  Right Ear: External ear normal.  Left Ear: External ear normal.  Nose: Nose normal.  Mouth/Throat: Oropharynx is clear and moist.  Eyes: Conjunctivae and EOM are normal.  Neck: Normal range of motion. Neck supple.   Cardiovascular: Normal rate, normal heart sounds and intact distal pulses.  An irregularly irregular rhythm present.  Respiratory: Effort normal and breath sounds normal. No respiratory distress. She has no wheezes.  GI: Soft. Bowel sounds are normal. She exhibits no distension. There is no tenderness.  Musculoskeletal:       Right hip: Normal.       Left hip: Normal.       Left knee: Normal.  Her right knee shows slight varus, range of motion 10 to 125, marked crepitus on range of motion, tenderness medial greater than lateral with no instability.  Neurological: She is alert and oriented to person, place, and time. She has normal strength and normal reflexes. No sensory deficit.  Skin: No rash noted. She is not diaphoretic. No erythema.  Psychiatric: She has a normal mood and affect. Her behavior is normal.    Vital signs in last 24 hours: Temp:  [97.2 F (36.2 C)] 97.2 F (  36.2 C) (02/07 1155) Pulse Rate:  [84] 84 (02/07 1155) Resp:  [18] 18 (02/07 1155) BP: (147)/(83) 147/83 mmHg (02/07 1155) SpO2:  [96 %] 96 % (02/07 1155) Weight:  [122.018 kg (269 lb)] 122.018 kg (269 lb) (02/07 1155)    Imaging Review Plain radiographs demonstrate severe degenerative joint disease of the right knee(s). The overall alignment ismild varus. The bone quality appears to be good for age and reported activity level.  Assessment/Plan:  End stage primary osteoarthritis, right knee   The patient history, physical examination, clinical judgment of the provider and imaging studies are consistent with end stage degenerative joint disease of the right knee(s) and total knee arthroplasty is deemed medically necessary. The treatment options including medical management, injection therapy arthroscopy and arthroplasty were discussed at length. The risks and benefits of total knee arthroplasty were presented and reviewed. The risks due to aseptic loosening, infection, stiffness, patella tracking problems,  thromboembolic complications and other imponderables were discussed. The patient acknowledged the explanation, agreed to proceed with the plan and consent was signed. Patient is being admitted for inpatient treatment for surgery, pain control, PT, OT, prophylactic antibiotics, VTE prophylaxis, progressive ambulation and ADL's and discharge planning. The patient is planning to be discharged to skilled nursing facility (reports that she has no help at home)    PCP: Dr. Araceli Bouche Cardio: Dr. Andres Labrum. Louisa Second   Ardeen Jourdain, PA-C

## 2016-01-23 MED ORDER — VANCOMYCIN HCL 10 G IV SOLR
1500.0000 mg | INTRAVENOUS | Status: AC
  Administered 2016-01-24: 1500 mg via INTRAVENOUS
  Filled 2016-01-23: qty 1500

## 2016-01-24 ENCOUNTER — Inpatient Hospital Stay (HOSPITAL_COMMUNITY): Payer: Medicare Other | Admitting: Anesthesiology

## 2016-01-24 ENCOUNTER — Encounter (HOSPITAL_COMMUNITY): Payer: Self-pay | Admitting: *Deleted

## 2016-01-24 ENCOUNTER — Encounter (HOSPITAL_COMMUNITY)
Admission: RE | Disposition: A | Discharge status: Home Health | Payer: Self-pay | Source: Ambulatory Visit | Attending: Orthopedic Surgery

## 2016-01-24 ENCOUNTER — Inpatient Hospital Stay (HOSPITAL_COMMUNITY)
Admission: RE | Admit: 2016-01-24 | Discharge: 2016-01-26 | DRG: 470 | Disposition: A | Discharge status: Home Health | Payer: Medicare Other | Source: Ambulatory Visit | Attending: Orthopedic Surgery | Admitting: Orthopedic Surgery

## 2016-01-24 DIAGNOSIS — M1711 Unilateral primary osteoarthritis, right knee: Secondary | ICD-10-CM | POA: Diagnosis present

## 2016-01-24 DIAGNOSIS — Z85038 Personal history of other malignant neoplasm of large intestine: Secondary | ICD-10-CM

## 2016-01-24 DIAGNOSIS — G4730 Sleep apnea, unspecified: Secondary | ICD-10-CM | POA: Diagnosis present

## 2016-01-24 DIAGNOSIS — Z6841 Body Mass Index (BMI) 40.0 and over, adult: Secondary | ICD-10-CM

## 2016-01-24 DIAGNOSIS — Z7984 Long term (current) use of oral hypoglycemic drugs: Secondary | ICD-10-CM | POA: Diagnosis not present

## 2016-01-24 DIAGNOSIS — Z79899 Other long term (current) drug therapy: Secondary | ICD-10-CM | POA: Diagnosis not present

## 2016-01-24 DIAGNOSIS — Z794 Long term (current) use of insulin: Secondary | ICD-10-CM

## 2016-01-24 DIAGNOSIS — I10 Essential (primary) hypertension: Secondary | ICD-10-CM | POA: Diagnosis present

## 2016-01-24 DIAGNOSIS — Z87891 Personal history of nicotine dependence: Secondary | ICD-10-CM | POA: Diagnosis not present

## 2016-01-24 DIAGNOSIS — M179 Osteoarthritis of knee, unspecified: Secondary | ICD-10-CM | POA: Diagnosis present

## 2016-01-24 DIAGNOSIS — Z01812 Encounter for preprocedural laboratory examination: Secondary | ICD-10-CM

## 2016-01-24 DIAGNOSIS — E119 Type 2 diabetes mellitus without complications: Secondary | ICD-10-CM | POA: Diagnosis present

## 2016-01-24 DIAGNOSIS — M25561 Pain in right knee: Secondary | ICD-10-CM | POA: Diagnosis not present

## 2016-01-24 DIAGNOSIS — E039 Hypothyroidism, unspecified: Secondary | ICD-10-CM | POA: Diagnosis present

## 2016-01-24 DIAGNOSIS — M1710 Unilateral primary osteoarthritis, unspecified knee: Secondary | ICD-10-CM | POA: Diagnosis present

## 2016-01-24 HISTORY — PX: TOTAL KNEE ARTHROPLASTY: SHX125

## 2016-01-24 LAB — TYPE AND SCREEN
ABO/RH(D): O POS
ANTIBODY SCREEN: NEGATIVE

## 2016-01-24 LAB — GLUCOSE, CAPILLARY
GLUCOSE-CAPILLARY: 121 mg/dL — AB (ref 65–99)
GLUCOSE-CAPILLARY: 312 mg/dL — AB (ref 65–99)
Glucose-Capillary: 106 mg/dL — ABNORMAL HIGH (ref 65–99)
Glucose-Capillary: 238 mg/dL — ABNORMAL HIGH (ref 65–99)

## 2016-01-24 LAB — APTT: aPTT: 27 seconds (ref 24–37)

## 2016-01-24 SURGERY — ARTHROPLASTY, KNEE, TOTAL
Anesthesia: Spinal | Site: Knee | Laterality: Right

## 2016-01-24 MED ORDER — OLMESARTAN MEDOXOMIL-HCTZ 40-25 MG PO TABS: 1.0000 | ORAL_TABLET | Freq: Every day | ORAL | Status: DC

## 2016-01-24 MED ORDER — PHENYLEPHRINE HCL 10 MG/ML IJ SOLN: 30.0000 ug/min | INTRAVENOUS | Status: DC

## 2016-01-24 MED ORDER — DOCUSATE SODIUM 100 MG PO CAPS
100.0000 mg | ORAL_CAPSULE | Freq: Two times a day (BID) | ORAL | Status: DC
Start: 2016-01-24 — End: 2016-01-26
  Administered 2016-01-24 – 2016-01-26 (×4): 100 mg via ORAL

## 2016-01-24 MED ORDER — SODIUM CHLORIDE 0.9 % IR SOLN
Status: DC | PRN
  Administered 2016-01-24: 1000 mL

## 2016-01-24 MED ORDER — METHOCARBAMOL 1000 MG/10ML IJ SOLN
500.0000 mg | Freq: Four times a day (QID) | INTRAVENOUS | Status: DC | PRN
  Filled 2016-01-24: qty 5

## 2016-01-24 MED ORDER — SODIUM CHLORIDE 0.9 % IJ SOLN
INTRAMUSCULAR | Status: AC
  Filled 2016-01-24: qty 50

## 2016-01-24 MED ORDER — BUPIVACAINE HCL (PF) 0.25 % IJ SOLN
INTRAMUSCULAR | Status: AC
  Filled 2016-01-24: qty 30

## 2016-01-24 MED ORDER — PROPOFOL 10 MG/ML IV BOLUS
INTRAVENOUS | Status: DC | PRN
  Administered 2016-01-24: 30 mg via INTRAVENOUS

## 2016-01-24 MED ORDER — CHLORHEXIDINE GLUCONATE 4 % EX LIQD: 60.0000 mL | Freq: Once | CUTANEOUS | Status: DC

## 2016-01-24 MED ORDER — METOCLOPRAMIDE HCL 10 MG PO TABS: 5.0000 mg | ORAL_TABLET | Freq: Three times a day (TID) | ORAL | Status: DC | PRN

## 2016-01-24 MED ORDER — HYDROCHLOROTHIAZIDE 25 MG PO TABS
25.0000 mg | ORAL_TABLET | Freq: Every day | ORAL | Status: DC
  Administered 2016-01-25 – 2016-01-26 (×2): 25 mg via ORAL
  Filled 2016-01-24 (×2): qty 1

## 2016-01-24 MED ORDER — DEXAMETHASONE SODIUM PHOSPHATE 10 MG/ML IJ SOLN
10.0000 mg | Freq: Once | INTRAMUSCULAR | Status: AC
  Administered 2016-01-25: 10 mg via INTRAVENOUS
  Filled 2016-01-24: qty 1

## 2016-01-24 MED ORDER — BUPIVACAINE LIPOSOME 1.3 % IJ SUSP
INTRAMUSCULAR | Status: DC | PRN
  Administered 2016-01-24: 20 mL

## 2016-01-24 MED ORDER — POLYETHYLENE GLYCOL 3350 17 G PO PACK: 17.0000 g | PACK | Freq: Every day | ORAL | Status: DC | PRN

## 2016-01-24 MED ORDER — DIPHENHYDRAMINE HCL 12.5 MG/5ML PO ELIX: 12.5000 mg | ORAL_SOLUTION | ORAL | Status: DC | PRN

## 2016-01-24 MED ORDER — MIDAZOLAM HCL 5 MG/5ML IJ SOLN
INTRAMUSCULAR | Status: DC | PRN
  Administered 2016-01-24: 2 mg via INTRAVENOUS

## 2016-01-24 MED ORDER — PROPOFOL 500 MG/50ML IV EMUL
INTRAVENOUS | Status: DC | PRN
  Administered 2016-01-24: 100 ug/kg/min via INTRAVENOUS

## 2016-01-24 MED ORDER — FENTANYL CITRATE (PF) 100 MCG/2ML IJ SOLN
INTRAMUSCULAR | Status: AC
  Filled 2016-01-24: qty 2

## 2016-01-24 MED ORDER — INSULIN GLARGINE 100 UNIT/ML ~~LOC~~ SOLN
75.0000 [IU] | Freq: Every morning | SUBCUTANEOUS | Status: DC
  Administered 2016-01-24 – 2016-01-25 (×2): 75 [IU] via SUBCUTANEOUS
  Filled 2016-01-24 (×3): qty 0.75

## 2016-01-24 MED ORDER — HYDROMORPHONE HCL 1 MG/ML IJ SOLN: 0.5000 mg | INTRAMUSCULAR | Status: DC | PRN

## 2016-01-24 MED ORDER — RIVAROXABAN 10 MG PO TABS: 10.0000 mg | ORAL_TABLET | Freq: Every day | ORAL | Status: DC

## 2016-01-24 MED ORDER — DABIGATRAN ETEXILATE MESYLATE 150 MG PO CAPS
150.0000 mg | ORAL_CAPSULE | Freq: Two times a day (BID) | ORAL | Status: DC
  Administered 2016-01-25 – 2016-01-26 (×3): 150 mg via ORAL
  Filled 2016-01-24 (×4): qty 1

## 2016-01-24 MED ORDER — METOCLOPRAMIDE HCL 5 MG/ML IJ SOLN: 5.0000 mg | Freq: Three times a day (TID) | INTRAMUSCULAR | Status: DC | PRN

## 2016-01-24 MED ORDER — BISACODYL 10 MG RE SUPP: 10.0000 mg | Freq: Every day | RECTAL | Status: DC | PRN

## 2016-01-24 MED ORDER — HYDROMORPHONE HCL 2 MG PO TABS
2.0000 mg | ORAL_TABLET | ORAL | Status: DC | PRN
  Administered 2016-01-24: 2 mg via ORAL
  Administered 2016-01-24: 4 mg via ORAL
  Administered 2016-01-25: 2 mg via ORAL
  Filled 2016-01-24: qty 2
  Filled 2016-01-24 (×2): qty 1

## 2016-01-24 MED ORDER — FENTANYL CITRATE (PF) 100 MCG/2ML IJ SOLN
INTRAMUSCULAR | Status: DC | PRN
  Administered 2016-01-24: 50 ug via INTRAVENOUS

## 2016-01-24 MED ORDER — PHENYLEPHRINE HCL 10 MG/ML IJ SOLN
10.0000 mg | INTRAVENOUS | Status: DC | PRN
  Administered 2016-01-24: 40 ug/min via INTRAVENOUS

## 2016-01-24 MED ORDER — LACTATED RINGERS IV SOLN
INTRAVENOUS | Status: DC
  Administered 2016-01-24 (×2): via INTRAVENOUS

## 2016-01-24 MED ORDER — BUPIVACAINE LIPOSOME 1.3 % IJ SUSP
20.0000 mL | Freq: Once | INTRAMUSCULAR | Status: DC
  Filled 2016-01-24: qty 20

## 2016-01-24 MED ORDER — TRANEXAMIC ACID 1000 MG/10ML IV SOLN
1000.0000 mg | INTRAVENOUS | Status: AC
  Administered 2016-01-24: 1000 mg via INTRAVENOUS
  Filled 2016-01-24: qty 10

## 2016-01-24 MED ORDER — SODIUM CHLORIDE 0.9 % IJ SOLN
INTRAMUSCULAR | Status: DC | PRN
  Administered 2016-01-24: 30 mL

## 2016-01-24 MED ORDER — SODIUM CHLORIDE 0.9 % IV SOLN
INTRAVENOUS | Status: DC
  Administered 2016-01-24: 08:00:00 via INTRAVENOUS

## 2016-01-24 MED ORDER — LORATADINE 10 MG PO TABS
10.0000 mg | ORAL_TABLET | Freq: Every day | ORAL | Status: DC
  Administered 2016-01-25: 10 mg via ORAL
  Filled 2016-01-24 (×2): qty 1

## 2016-01-24 MED ORDER — MENTHOL 3 MG MT LOZG: 1.0000 | LOZENGE | OROMUCOSAL | Status: DC | PRN

## 2016-01-24 MED ORDER — FAMOTIDINE 20 MG PO TABS
20.0000 mg | ORAL_TABLET | Freq: Every day | ORAL | Status: DC
  Administered 2016-01-24: 20 mg via ORAL
  Filled 2016-01-24 (×3): qty 1

## 2016-01-24 MED ORDER — TRANEXAMIC ACID 1000 MG/10ML IV SOLN
1000.0000 mg | Freq: Once | INTRAVENOUS | Status: AC
  Administered 2016-01-24: 1000 mg via INTRAVENOUS
  Filled 2016-01-24: qty 10

## 2016-01-24 MED ORDER — BUPIVACAINE IN DEXTROSE 0.75-8.25 % IT SOLN
INTRATHECAL | Status: DC | PRN
  Administered 2016-01-24: 1.8 mL via INTRATHECAL

## 2016-01-24 MED ORDER — ONDANSETRON HCL 4 MG PO TABS: 4.0000 mg | ORAL_TABLET | Freq: Four times a day (QID) | ORAL | Status: DC | PRN

## 2016-01-24 MED ORDER — INSULIN ASPART 100 UNIT/ML ~~LOC~~ SOLN: 20.0000 [IU] | Freq: Three times a day (TID) | SUBCUTANEOUS | Status: DC

## 2016-01-24 MED ORDER — ACETAMINOPHEN 10 MG/ML IV SOLN
1000.0000 mg | Freq: Once | INTRAVENOUS | Status: AC
  Administered 2016-01-24: 1000 mg via INTRAVENOUS
  Filled 2016-01-24: qty 100

## 2016-01-24 MED ORDER — LEVOTHYROXINE SODIUM 50 MCG PO TABS
50.0000 ug | ORAL_TABLET | Freq: Every day | ORAL | Status: DC
  Administered 2016-01-25 – 2016-01-26 (×2): 50 ug via ORAL
  Filled 2016-01-24 (×3): qty 1

## 2016-01-24 MED ORDER — METFORMIN HCL ER 500 MG PO TB24
500.0000 mg | ORAL_TABLET | Freq: Two times a day (BID) | ORAL | Status: DC
  Administered 2016-01-24: 500 mg via ORAL
  Filled 2016-01-24 (×4): qty 1

## 2016-01-24 MED ORDER — ACETAMINOPHEN 500 MG PO TABS
1000.0000 mg | ORAL_TABLET | Freq: Four times a day (QID) | ORAL | Status: DC
  Administered 2016-01-24 – 2016-01-25 (×3): 1000 mg via ORAL
  Filled 2016-01-24 (×4): qty 2

## 2016-01-24 MED ORDER — ONDANSETRON HCL 4 MG/2ML IJ SOLN: 4.0000 mg | Freq: Four times a day (QID) | INTRAMUSCULAR | Status: DC | PRN

## 2016-01-24 MED ORDER — HYDROMORPHONE HCL 1 MG/ML IJ SOLN: 0.2500 mg | INTRAMUSCULAR | Status: DC | PRN

## 2016-01-24 MED ORDER — INSULIN ASPART 100 UNIT/ML ~~LOC~~ SOLN
0.0000 [IU] | Freq: Three times a day (TID) | SUBCUTANEOUS | Status: DC
  Administered 2016-01-24: 5 [IU] via SUBCUTANEOUS
  Administered 2016-01-25: 11 [IU] via SUBCUTANEOUS
  Administered 2016-01-25: 8 [IU] via SUBCUTANEOUS
  Administered 2016-01-25: 5 [IU] via SUBCUTANEOUS
  Administered 2016-01-26: 3 [IU] via SUBCUTANEOUS

## 2016-01-24 MED ORDER — PROPOFOL 10 MG/ML IV BOLUS
INTRAVENOUS | Status: AC
  Filled 2016-01-24: qty 60

## 2016-01-24 MED ORDER — METHOCARBAMOL 500 MG PO TABS
500.0000 mg | ORAL_TABLET | Freq: Four times a day (QID) | ORAL | Status: DC | PRN
  Administered 2016-01-24 – 2016-01-26 (×6): 500 mg via ORAL
  Filled 2016-01-24 (×6): qty 1

## 2016-01-24 MED ORDER — ACETAMINOPHEN 650 MG RE SUPP: 650.0000 mg | Freq: Four times a day (QID) | RECTAL | Status: DC | PRN

## 2016-01-24 MED ORDER — IRBESARTAN 300 MG PO TABS
300.0000 mg | ORAL_TABLET | Freq: Every day | ORAL | Status: DC
  Administered 2016-01-25 – 2016-01-26 (×2): 300 mg via ORAL
  Filled 2016-01-24 (×3): qty 1

## 2016-01-24 MED ORDER — DEXAMETHASONE SODIUM PHOSPHATE 10 MG/ML IJ SOLN
10.0000 mg | Freq: Once | INTRAMUSCULAR | Status: AC
  Administered 2016-01-24: 10 mg via INTRAVENOUS

## 2016-01-24 MED ORDER — POTASSIUM CHLORIDE IN NACL 20-0.9 MEQ/L-% IV SOLN
INTRAVENOUS | Status: DC
  Administered 2016-01-24: 15:00:00 via INTRAVENOUS
  Filled 2016-01-24 (×3): qty 1000

## 2016-01-24 MED ORDER — BUPIVACAINE HCL 0.25 % IJ SOLN
INTRAMUSCULAR | Status: DC | PRN
  Administered 2016-01-24: 20 mL

## 2016-01-24 MED ORDER — TRAMADOL HCL 50 MG PO TABS: 50.0000 mg | ORAL_TABLET | Freq: Four times a day (QID) | ORAL | Status: DC | PRN

## 2016-01-24 MED ORDER — FUROSEMIDE 20 MG PO TABS
20.0000 mg | ORAL_TABLET | Freq: Every day | ORAL | Status: DC | PRN
  Filled 2016-01-24: qty 1

## 2016-01-24 MED ORDER — PROPOFOL 10 MG/ML IV BOLUS
INTRAVENOUS | Status: AC
  Filled 2016-01-24: qty 20

## 2016-01-24 MED ORDER — FLEET ENEMA 7-19 GM/118ML RE ENEM: 1.0000 | ENEMA | Freq: Once | RECTAL | Status: DC | PRN

## 2016-01-24 MED ORDER — VANCOMYCIN HCL IN DEXTROSE 1-5 GM/200ML-% IV SOLN
1000.0000 mg | Freq: Two times a day (BID) | INTRAVENOUS | Status: AC
  Administered 2016-01-24: 1000 mg via INTRAVENOUS
  Filled 2016-01-24: qty 200

## 2016-01-24 MED ORDER — PHENOL 1.4 % MT LIQD: 1.0000 | OROMUCOSAL | Status: DC | PRN

## 2016-01-24 MED ORDER — ACETAMINOPHEN 325 MG PO TABS: 650.0000 mg | ORAL_TABLET | Freq: Four times a day (QID) | ORAL | Status: DC | PRN

## 2016-01-24 MED ORDER — METOPROLOL TARTRATE 50 MG PO TABS
50.0000 mg | ORAL_TABLET | Freq: Two times a day (BID) | ORAL | Status: DC
Start: 2016-01-24 — End: 2016-01-26
  Administered 2016-01-24 – 2016-01-26 (×4): 50 mg via ORAL
  Filled 2016-01-24 (×5): qty 1

## 2016-01-24 MED ORDER — ZOLPIDEM TARTRATE 5 MG PO TABS
5.0000 mg | ORAL_TABLET | Freq: Every day | ORAL | Status: DC
  Filled 2016-01-24: qty 1

## 2016-01-24 MED ORDER — PHENYLEPHRINE HCL 10 MG/ML IJ SOLN
INTRAMUSCULAR | Status: AC
  Filled 2016-01-24: qty 1

## 2016-01-24 MED ORDER — ACETAMINOPHEN 10 MG/ML IV SOLN
INTRAVENOUS | Status: AC
  Filled 2016-01-24: qty 100

## 2016-01-24 MED ORDER — MIDAZOLAM HCL 2 MG/2ML IJ SOLN
INTRAMUSCULAR | Status: AC
  Filled 2016-01-24: qty 2

## 2016-01-24 SURGICAL SUPPLY — 52 items
BAG DECANTER FOR FLEXI CONT (MISCELLANEOUS) ×2 IMPLANT
BAG SPEC THK2 15X12 ZIP CLS (MISCELLANEOUS) ×1
BAG ZIPLOCK 12X15 (MISCELLANEOUS) ×2 IMPLANT
BANDAGE ACE 6X5 VEL STRL LF (GAUZE/BANDAGES/DRESSINGS) ×1 IMPLANT
BANDAGE ELASTIC 6 VELCRO ST LF (GAUZE/BANDAGES/DRESSINGS) ×1 IMPLANT
BLADE SAG 18X100X1.27 (BLADE) ×2 IMPLANT
BLADE SAW SGTL 11.0X1.19X90.0M (BLADE) ×2 IMPLANT
BOWL SMART MIX CTS (DISPOSABLE) ×2 IMPLANT
CAPT KNEE TOTAL 3 ATTUNE ×1 IMPLANT
CEMENT HV SMART SET (Cement) ×4 IMPLANT
CLOTH BEACON ORANGE TIMEOUT ST (SAFETY) ×2 IMPLANT
CUFF TOURN SGL QUICK 34 (TOURNIQUET CUFF) ×2
CUFF TRNQT CYL 34X4X40X1 (TOURNIQUET CUFF) ×1 IMPLANT
DECANTER SPIKE VIAL GLASS SM (MISCELLANEOUS) ×2 IMPLANT
DRAPE U-SHAPE 47X51 STRL (DRAPES) ×2 IMPLANT
DRSG ADAPTIC 3X8 NADH LF (GAUZE/BANDAGES/DRESSINGS) ×2 IMPLANT
DRSG PAD ABDOMINAL 8X10 ST (GAUZE/BANDAGES/DRESSINGS) ×1 IMPLANT
DURAPREP 26ML APPLICATOR (WOUND CARE) ×2 IMPLANT
ELECT REM PT RETURN 9FT ADLT (ELECTROSURGICAL) ×2
ELECTRODE REM PT RTRN 9FT ADLT (ELECTROSURGICAL) ×1 IMPLANT
EVACUATOR 1/8 PVC DRAIN (DRAIN) ×2 IMPLANT
GAUZE SPONGE 4X4 12PLY STRL (GAUZE/BANDAGES/DRESSINGS) ×2 IMPLANT
GLOVE BIO SURGEON STRL SZ7.5 (GLOVE) IMPLANT
GLOVE BIO SURGEON STRL SZ8 (GLOVE) ×2 IMPLANT
GLOVE BIOGEL PI IND STRL 6.5 (GLOVE) IMPLANT
GLOVE BIOGEL PI IND STRL 8 (GLOVE) ×1 IMPLANT
GLOVE BIOGEL PI INDICATOR 6.5 (GLOVE)
GLOVE BIOGEL PI INDICATOR 8 (GLOVE) ×1
GLOVE SURG SS PI 6.5 STRL IVOR (GLOVE) IMPLANT
GOWN STRL REUS W/TWL LRG LVL3 (GOWN DISPOSABLE) ×2 IMPLANT
GOWN STRL REUS W/TWL XL LVL3 (GOWN DISPOSABLE) IMPLANT
HANDPIECE INTERPULSE COAX TIP (DISPOSABLE) ×2
IMMOBILIZER KNEE 20 (SOFTGOODS) ×1 IMPLANT
IMMOBILIZER KNEE 20 THIGH 36 (SOFTGOODS) ×1 IMPLANT
MANIFOLD NEPTUNE II (INSTRUMENTS) ×2 IMPLANT
NS IRRIG 1000ML POUR BTL (IV SOLUTION) ×2 IMPLANT
PACK TOTAL KNEE CUSTOM (KITS) ×2 IMPLANT
PAD ABD 8X10 STRL (GAUZE/BANDAGES/DRESSINGS) ×1 IMPLANT
PADDING CAST COTTON 6X4 STRL (CAST SUPPLIES) ×4 IMPLANT
POSITIONER SURGICAL ARM (MISCELLANEOUS) ×2 IMPLANT
SET HNDPC FAN SPRY TIP SCT (DISPOSABLE) ×1 IMPLANT
STRIP CLOSURE SKIN 1/2X4 (GAUZE/BANDAGES/DRESSINGS) ×3 IMPLANT
SUT MNCRL AB 4-0 PS2 18 (SUTURE) ×2 IMPLANT
SUT VIC AB 2-0 CT1 27 (SUTURE) ×6
SUT VIC AB 2-0 CT1 TAPERPNT 27 (SUTURE) ×3 IMPLANT
SUT VLOC 180 0 24IN GS25 (SUTURE) ×2 IMPLANT
SYR 50ML LL SCALE MARK (SYRINGE) ×2 IMPLANT
TRAY FOLEY W/METER SILVER 14FR (SET/KITS/TRAYS/PACK) ×2 IMPLANT
TRAY FOLEY W/METER SILVER 16FR (SET/KITS/TRAYS/PACK) ×1 IMPLANT
WATER STERILE IRR 1500ML POUR (IV SOLUTION) ×2 IMPLANT
WRAP KNEE MAXI GEL POST OP (GAUZE/BANDAGES/DRESSINGS) ×2 IMPLANT
YANKAUER SUCT BULB TIP 10FT TU (MISCELLANEOUS) ×2 IMPLANT

## 2016-01-24 NOTE — Anesthesia Postprocedure Evaluation (Signed)
Anesthesia Post Note  Patient: Ann [name]  Procedure(s) Performed: Procedure(s) (LRB): RIGHT TOTAL KNEE ARTHROPLASTY (Right)  Patient location during evaluation: PACU Anesthesia Type: Spinal Level of consciousness: awake and alert Pain management: pain level controlled Vital Signs Assessment: post-procedure vital signs reviewed and stable Respiratory status: spontaneous breathing, nonlabored ventilation, respiratory function stable and patient connected to nasal cannula oxygen Cardiovascular status: blood pressure returned to baseline and stable Postop Assessment: no signs of nausea or vomiting Anesthetic complications: no    Last Vitals:  Filed Vitals:   01/24/16 1229 01/24/16 1230  BP: 119/71 119/71  Pulse: 72 72  Temp: 36.5 C 36.5 C  Resp: 14 16    Last Pain:  Filed Vitals:   01/24/16 1253  PainSc: 0-No pain                 Jameca Chumley L

## 2016-01-24 NOTE — Anesthesia Procedure Notes (Addendum)
Spinal Patient location during procedure: OR Start time: 01/24/2016 9:02 AM End time: 01/24/2016 9:10 AM Reason for block: at surgeon's request Staffing Resident/CRNA: Anne Fu Performed by: resident/CRNA  Preanesthetic Checklist Completed: patient identified, site marked, surgical consent, pre-op evaluation, timeout performed, IV checked, risks and benefits discussed, monitors and equipment checked and at surgeon's request Spinal Block Patient position: sitting Prep: Betadine Approach: right paramedian Location: L3-4 Injection technique: single-shot Needle Needle type: Sprotte  Needle gauge: 24 G Needle length: 12.7 cm Assessment Sensory level: T6 Additional Notes Expiration date of kit checked and confirmed. Patient tolerated procedure well, without complications. X 2 attempt with noted clear CSF return. Loss of motor and sensory on exam post injection. First attempt at L2-L3 without success changed to L3-L4 X 1 with noted clear CSF return.

## 2016-01-24 NOTE — Progress Notes (Signed)
Utilization review completed.  

## 2016-01-24 NOTE — Evaluation (Signed)
Physical Therapy Evaluation Patient Details Name: Ann [name] MRN: [id number] DOB: June 23, 1951 Today's Date: 01/24/2016   History of Present Illness  RTKA  Clinical Impression  Patient tolerated  Ambulating x 60'. Patient will benefit from PT to address problems listed in the note below to DC to home.   Follow Up Recommendations Home health PT;Supervision - Intermittent    Equipment Recommendations  None recommended by PT    Recommendations for Other Services       Precautions / Restrictions Precautions Precautions: Knee Required Braces or Orthoses: Knee Immobilizer - Right Knee Immobilizer - Right: Discontinue once straight leg raise with < 10 degree lag      Mobility  Bed Mobility Overal bed mobility: Needs Assistance Bed Mobility: Supine to Sit     Supine to sit: Supervision     General bed mobility comments: manages R leg  Transfers Overall transfer level: Needs assistance Equipment used: Rolling walker (2 wheeled) Transfers: Sit to/from Stand Sit to Stand: Min assist         General transfer comment: cues for hand and r leg position  Ambulation/Gait Ambulation/Gait assistance: Min assist Ambulation Distance (Feet): 60 Feet Assistive device: Rolling walker (2 wheeled) Gait Pattern/deviations: Step-to pattern;Step-through pattern     General Gait Details: cues for sequence, and posture  Stairs            Wheelchair Mobility    Modified Rankin (Stroke Patients Only)       Balance                                             Pertinent Vitals/Pain Pain Assessment: 0-10 Pain Score: 6  Pain Location: after standing Pain Descriptors / Indicators: Aching Pain Intervention(s): Monitored during session;Premedicated before session;Repositioned;Ice applied    Home Living Family/patient expects to be discharged to:: Private residence Living Arrangements: Alone Available Help at Discharge: Friend(s) Type of Home:  House Home Access: Stairs to enter Entrance Stairs-Rails: Right Entrance Stairs-Number of Steps: 3 Home Layout: One level Home Equipment: Walker - 2 wheels Additional Comments: reports and 80 year lod will be staying with her.    Prior Function Level of Independence: Independent               Hand Dominance        Extremity/Trunk Assessment               Lower Extremity Assessment: RLE deficits/detail RLE Deficits / Details: + SLR    Cervical / Trunk Assessment: Normal  Communication   Communication: No difficulties  Cognition Arousal/Alertness: Awake/alert Behavior During Therapy: WFL for tasks assessed/performed Overall Cognitive Status: Within Functional Limits for tasks assessed                      General Comments      Exercises        Assessment/Plan    PT Assessment Patient needs continued PT services  PT Diagnosis Difficulty walking;Acute pain   PT Problem List Decreased strength;Decreased range of motion;Decreased activity tolerance;Decreased mobility;Cardiopulmonary status limiting activity;Decreased knowledge of precautions;Decreased safety awareness;Decreased knowledge of use of DME  PT Treatment Interventions DME instruction;Gait training;Stair training;Functional mobility training;Therapeutic activities;Therapeutic exercise;Patient/family education   PT Goals (Current goals can be found in the Care Plan section) Acute Rehab PT Goals Patient Stated Goal: to go home PT Goal Formulation: With patient  Time For Goal Achievement: 01/27/16 Potential to Achieve Goals: Good    Frequency 7X/week   Barriers to discharge        Co-evaluation               End of Session Equipment Utilized During Treatment: Right knee immobilizer Activity Tolerance: Patient tolerated treatment well Patient left: in chair;with call bell/phone within reach Nurse Communication: Mobility status         Time: [id number] PT Time Calculation  (min) (ACUTE ONLY): 24 min   Charges:   PT Evaluation $PT Eval Low Complexity: 1 Procedure PT Treatments $Gait Training: 8-22 mins   PT G Codes:        Claretha Cooper 01/24/2016, 5:54 PM

## 2016-01-24 NOTE — Interval H&P Note (Signed)
History and Physical Interval Note:  01/24/2016 6:56 AM  Ann [name]  has presented today for surgery, with the diagnosis of right knee osteoarthritis  The various methods of treatment have been discussed with the patient and family. After consideration of risks, benefits and other options for treatment, the patient has consented to  Procedure(s): RIGHT TOTAL KNEE ARTHROPLASTY (Right) as a surgical intervention .  The patient's history has been reviewed, patient examined, no change in status, stable for surgery.  I have reviewed the patient's chart and labs.  Questions were answered to the patient's satisfaction.     Gearlean Alf

## 2016-01-24 NOTE — Op Note (Signed)
Pre-operative diagnosis- Osteoarthritis  Right knee(s)  Post-operative diagnosis- Osteoarthritis Right knee(s)  Procedure-  Right  Total Knee Arthroplasty  Surgeon- Dione Plover. Gurtha Picker, MD  Assistant- Ardeen Jourdain, PA-C   Anesthesia-  Spinal  EBL-* No blood loss amount entered *   Drains Hemovac  Tourniquet time-  Total Tourniquet Time Documented: Thigh (Right) - 32 minutes Total: Thigh (Right) - 32 minutes     Complications- None  Condition-PACU - hemodynamically stable.   Brief Clinical Note  Ann [name] is a 65 y.o. year old female with end stage OA of her right knee with progressively worsening pain and dysfunction. She has constant pain, with activity and at rest and significant functional deficits with difficulties even with ADLs. She has had extensive non-op management including analgesics, injections of cortisone and viscosupplements, and home exercise program, but remains in significant pain with significant dysfunction.Radiographs show bone on bone arthritis medial and patellofemoral. She presents now for right Total Knee Arthroplasty.    Procedure in detail---   The patient is brought into the operating room and positioned supine on the operating table. After successful administration of  Spinal,   a tourniquet is placed high on the  Right thigh(s) and the lower extremity is prepped and draped in the usual sterile fashion. Time out is performed by the operating team and then the  Right lower extremity is wrapped in Esmarch, knee flexed and the tourniquet inflated to 300 mmHg.       A midline incision is made with a ten blade through the subcutaneous tissue to the level of the extensor mechanism. A fresh blade is used to make a medial parapatellar arthrotomy. Soft tissue over the proximal medial tibia is subperiosteally elevated to the joint line with a knife and into the semimembranosus bursa with a Cobb elevator. Soft tissue over the proximal lateral tibia is elevated  with attention being paid to avoiding the patellar tendon on the tibial tubercle. The patella is everted, knee flexed 90 degrees and the ACL and PCL are removed. Findings are bone on bone medial and patellofemoral with massive global osteophytes.        The drill is used to create a starting hole in the distal femur and the canal is thoroughly irrigated with sterile saline to remove the fatty contents. The 5 degree Right  valgus alignment guide is placed into the femoral canal and the distal femoral cutting block is pinned to remove 9 mm off the distal femur. Resection is made with an oscillating saw.      The tibia is subluxed forward and the menisci are removed. The extramedullary alignment guide is placed referencing proximally at the medial aspect of the tibial tubercle and distally along the second metatarsal axis and tibial crest. The block is pinned to remove 68mm off the more deficient medial  side. Resection is made with an oscillating saw. Size 7is the most appropriate size for the tibia and the proximal tibia is prepared with the modular drill and keel punch for that size.      The femoral sizing guide is placed and size 8 is most appropriate. Rotation is marked off the epicondylar axis and confirmed by creating a rectangular flexion gap at 90 degrees. The size 8 cutting block is pinned in this rotation and the anterior, posterior and chamfer cuts are made with the oscillating saw. The intercondylar block is then placed and that cut is made.      Trial size 7 tibial component, trial  size 8 posterior stabilized femur and a 6  mm posterior stabilized rotating platform insert trial is placed. Full extension is achieved with excellent varus/valgus and anterior/posterior balance throughout full range of motion. The patella is everted and thickness measured to be 27  mm. Free hand resection is taken to 15 mm, a 41 template is placed, lug holes are drilled, trial patella is placed, and it tracks normally.  Osteophytes are removed off the posterior femur with the trial in place. All trials are removed and the cut bone surfaces prepared with pulsatile lavage. Cement is mixed and once ready for implantation, the size 7 tibial implant, size  8 posterior stabilized femoral component, and the size 41 patella are cemented in place and the patella is held with the clamp. The trial insert is placed and the knee held in full extension. The Exparel (20 ml mixed with 30 ml saline) and .25% Bupivicaine, are injected into the extensor mechanism, posterior capsule, medial and lateral gutters and subcutaneous tissues.  All extruded cement is removed and once the cement is hard the permanent 6 mm posterior stabilized rotating platform insert is placed into the tibial tray.      The wound is copiously irrigated with saline solution and the extensor mechanism closed over a hemovac drain with #1 V-loc suture. The tourniquet is released for a total tourniquet time of 32  minutes. Flexion against gravity is 135 degrees and the patella tracks normally. Subcutaneous tissue is closed with 2.0 vicryl and subcuticular with running 4.0 Monocryl. The incision is cleaned and dried and steri-strips and a bulky sterile dressing are applied. The limb is placed into a knee immobilizer and the patient is awakened and transported to recovery in stable condition.      Please note that a surgical assistant was a medical necessity for this procedure in order to perform it in a safe and expeditious manner. Surgical assistant was necessary to retract the ligaments and vital neurovascular structures to prevent injury to them and also necessary for proper positioning of the limb to allow for anatomic placement of the prosthesis.   Dione Plover Blessin Kanno, MD    01/24/2016, 10:05 AM

## 2016-01-24 NOTE — Transfer of Care (Signed)
Immediate Anesthesia Transfer of Care Note  Patient: Ann [name]  Procedure(s) Performed: Procedure(s): RIGHT TOTAL KNEE ARTHROPLASTY (Right)  Patient Location: PACU  Anesthesia Type:Spinal  Level of Consciousness:  sedated, patient cooperative and responds to stimulation  Airway & Oxygen Therapy:Patient Spontanous Breathing and Patient connected to face mask oxgen  Post-op Assessment:  Report given to PACU RN and Post -op Vital signs reviewed and stable  Post vital signs:  Reviewed and stable  Last Vitals:  Filed Vitals:   01/24/16 0636  BP: 136/73  Pulse: 88  Temp: 36.6 C  Resp: 20    Complications: No apparent anesthesia complications, [id number] level on exam. Denied pain on assessment.

## 2016-01-24 NOTE — Anesthesia Preprocedure Evaluation (Addendum)
Anesthesia Evaluation  Patient identified by MRN, date of birth, ID band Patient awake    Reviewed: Allergy & Precautions, H&P , NPO status , Patient's Chart, lab work & pertinent test results, reviewed documented beta blocker date and time   Airway Mallampati: II  TM Distance: >3 FB Neck ROM: full    Dental no notable dental hx. (+) Dental Advisory Given, Teeth Intact   Pulmonary sleep apnea , former smoker,    Pulmonary exam normal breath sounds clear to auscultation       Cardiovascular hypertension, Pt. on home beta blockers and Pt. on medications Normal cardiovascular exam+ dysrhythmias Atrial Fibrillation  Rhythm:regular Rate:Normal     Neuro/Psych negative neurological ROS  negative psych ROS   GI/Hepatic negative GI ROS, Neg liver ROS, Colon cancer   Endo/Other  diabetes, Well Controlled, Type 2, Insulin Dependent, Oral Hypoglycemic AgentsHypothyroidism Morbid obesity  Renal/GU negative Renal ROS  negative genitourinary   Musculoskeletal   Abdominal   Peds  Hematology negative hematology ROS (+)   Anesthesia Other Findings   Reproductive/Obstetrics negative OB ROS                            Anesthesia Physical Anesthesia Plan  ASA: III  Anesthesia Plan: Spinal   Post-op Pain Management:    Induction:   Airway Management Planned:   Additional Equipment:   Intra-op Plan:   Post-operative Plan:   Informed Consent: I have reviewed the patients History and Physical, chart, labs and discussed the procedure including the risks, benefits and alternatives for the proposed anesthesia with the patient or authorized representative who has indicated his/her understanding and acceptance.   Dental Advisory Given  Plan Discussed with: CRNA and Surgeon  Anesthesia Plan Comments:         Anesthesia Quick Evaluation

## 2016-01-25 LAB — CBC
HEMATOCRIT: 36.4 % (ref 36.0–46.0)
Hemoglobin: 12.2 g/dL (ref 12.0–15.0)
MCH: 30.7 pg (ref 26.0–34.0)
MCHC: 33.5 g/dL (ref 30.0–36.0)
MCV: 91.7 fL (ref 78.0–100.0)
PLATELETS: 169 10*3/uL (ref 150–400)
RBC: 3.97 MIL/uL (ref 3.87–5.11)
RDW: 13.1 % (ref 11.5–15.5)
WBC: 9.2 10*3/uL (ref 4.0–10.5)

## 2016-01-25 LAB — BASIC METABOLIC PANEL
ANION GAP: 11 (ref 5–15)
BUN: 20 mg/dL (ref 6–20)
CALCIUM: 8.1 mg/dL — AB (ref 8.9–10.3)
CO2: 21 mmol/L — AB (ref 22–32)
CREATININE: 0.99 mg/dL (ref 0.44–1.00)
Chloride: 105 mmol/L (ref 101–111)
GFR, EST NON AFRICAN AMERICAN: 58 mL/min — AB (ref >60)
Glucose, Bld: 272 mg/dL — ABNORMAL HIGH (ref 65–99)
Potassium: 4.2 mmol/L (ref 3.5–5.1)
SODIUM: 137 mmol/L (ref 135–145)

## 2016-01-25 LAB — GLUCOSE, CAPILLARY
GLUCOSE-CAPILLARY: 216 mg/dL — AB (ref 65–99)
GLUCOSE-CAPILLARY: 282 mg/dL — AB (ref 65–99)
GLUCOSE-CAPILLARY: 315 mg/dL — AB (ref 65–99)
Glucose-Capillary: 350 mg/dL — ABNORMAL HIGH (ref 65–99)

## 2016-01-25 MED ORDER — HYDROCODONE-ACETAMINOPHEN 7.5-325 MG PO TABS
1.0000 | ORAL_TABLET | ORAL | Status: DC | PRN
Start: 2016-01-25 — End: 2016-01-26
  Administered 2016-01-25 – 2016-01-26 (×5): 1 via ORAL
  Filled 2016-01-25: qty 2
  Filled 2016-01-25 (×5): qty 1

## 2016-01-25 MED ORDER — METHOCARBAMOL 500 MG PO TABS: 500.0000 mg | ORAL_TABLET | Freq: Four times a day (QID) | ORAL | Status: DC | PRN

## 2016-01-25 MED ORDER — HYDROCODONE-ACETAMINOPHEN 7.5-325 MG PO TABS: 1.0000 | ORAL_TABLET | ORAL | Status: DC | PRN

## 2016-01-25 MED ORDER — INSULIN ASPART 100 UNIT/ML ~~LOC~~ SOLN
5.0000 [IU] | Freq: Once | SUBCUTANEOUS | Status: AC
  Administered 2016-01-25: 5 [IU] via SUBCUTANEOUS

## 2016-01-25 MED ORDER — TRAMADOL HCL 50 MG PO TABS: 50.0000 mg | ORAL_TABLET | Freq: Four times a day (QID) | ORAL | Status: DC | PRN

## 2016-01-25 NOTE — Discharge Summary (Signed)
Physician Discharge Summary   Patient ID: Ann [name] MRN: [id number] DOB/AGE: 67-Aug-1952 67 y.o.  Admit date: 01/24/2016 Discharge date: 01/26/2016  Primary Diagnosis:  Osteoarthritis Right knee(s) Admission Diagnoses:  Past Medical History  Diagnosis Date  . Complication of anesthesia     sensitive to narcotics"extra sleepy". Versed- "no memory recall for 4 days postop"  . Cancer [facility])     Colon cancer '01, tx. radiation, chemo-  [facility]  . Dysrhythmia     hx. Atrial Fibrillation"chronic"- [facility]. [facility])  . Hypertension   . Sleep apnea     unable to tolreate Bipap.  Marland Kitchen Hypothyroidism   . Arthritis     osteoarthritis- knees.  . Hepatitis     "sub clinical case of Hepatitis in late 70's""has immunities"  . Diabetes mellitus without complication (HCC)     oral and Insulin  . 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   Discharge Diagnoses:   Principal Problem:   OA (osteoarthritis) of knee  Estimated body mass index is 40.91 kg/(m^2) as calculated from the following:   Height as of this encounter: 5' 8"  (1.727 m).   Weight as of this encounter: 122.018 kg (269 lb).  Procedure:  Procedure(s) (LRB): RIGHT TOTAL KNEE ARTHROPLASTY (Right)   Consults: None  HPI: Ann [name] is a 65 y.o. year old female with end stage OA of her right knee with progressively worsening pain and dysfunction. She has constant pain, with activity and at rest and significant functional deficits with difficulties even with ADLs. She has had extensive non-op management including analgesics, injections of cortisone and viscosupplements, and home exercise program, but remains in significant pain with significant dysfunction.Radiographs show bone on bone arthritis medial and patellofemoral. She presents now for right Total Knee Arthroplasty.  Laboratory Data: Admission on 01/24/2016, Discharged on 01/26/2016  Component Date  Value Ref Range Status  . Glucose-Capillary 01/24/2016 106* 65 - 99 mg/dL Final  . Comment 1 01/24/2016 Notify RN   Final  . Comment 2 01/24/2016 Document in Chart   Final  . aPTT 01/24/2016 27  24 - 37 seconds Final  . Glucose-Capillary 01/24/2016 121* 65 - 99 mg/dL Final  . Comment 1 01/24/2016 Notify RN   Final  . Comment 2 01/24/2016 Document in Chart   Final  . Glucose-Capillary 01/24/2016 238* 65 - 99 mg/dL Final  . WBC 01/25/2016 9.2  4.0 - 10.5 K/uL Final  . RBC 01/25/2016 3.97  3.87 - 5.11 MIL/uL Final  . Hemoglobin 01/25/2016 12.2  12.0 - 15.0 g/dL Final  . HCT 01/25/2016 36.4  36.0 - 46.0 % Final  . MCV 01/25/2016 91.7  78.0 - 100.0 fL Final  . MCH 01/25/2016 30.7  26.0 - 34.0 pg Final  . MCHC 01/25/2016 33.5  30.0 - 36.0 g/dL Final  . RDW 01/25/2016 13.1  11.5 - 15.5 % Final  . Platelets 01/25/2016 169  150 - 400 K/uL Final  . Sodium 01/25/2016 137  135 - 145 mmol/L Final  . Potassium 01/25/2016 4.2  3.5 - 5.1 mmol/L Final  . Chloride 01/25/2016 105  101 - 111 mmol/L Final  . CO2 01/25/2016 21* 22 - 32 mmol/L Final  . Glucose, Bld 01/25/2016 272* 65 - 99 mg/dL Final  . BUN 01/25/2016 20  6 - 20 mg/dL Final  . Creatinine, Ser 01/25/2016 0.99  0.44 - 1.00 mg/dL Final  . Calcium 01/25/2016 8.1* 8.9 - 10.3 mg/dL Final  .  GFR calc non Af Amer 01/25/2016 58* >60 mL/min Final  . GFR calc Af Amer 01/25/2016 >60  >60 mL/min Final   Comment: (NOTE) The eGFR has been calculated using the CKD EPI equation. This calculation has not been validated in all clinical situations. eGFR's persistently <60 mL/min signify possible Chronic Kidney Disease.   . Anion gap 01/25/2016 11  5 - 15 Final  . Glucose-Capillary 01/24/2016 312* 65 - 99 mg/dL Final  . Glucose-Capillary 01/25/2016 216* 65 - 99 mg/dL Final  . Glucose-Capillary 01/25/2016 282* 65 - 99 mg/dL Final  . WBC 01/26/2016 9.9  4.0 - 10.5 K/uL Final  . RBC 01/26/2016 3.90  3.87 - 5.11 MIL/uL Final  . Hemoglobin 01/26/2016 12.1   12.0 - 15.0 g/dL Final  . HCT 01/26/2016 36.1  36.0 - 46.0 % Final  . MCV 01/26/2016 92.6  78.0 - 100.0 fL Final  . MCH 01/26/2016 31.0  26.0 - 34.0 pg Final  . MCHC 01/26/2016 33.5  30.0 - 36.0 g/dL Final  . RDW 01/26/2016 13.4  11.5 - 15.5 % Final  . Platelets 01/26/2016 188  150 - 400 K/uL Final  . Sodium 01/26/2016 137  135 - 145 mmol/L Final  . Potassium 01/26/2016 3.6  3.5 - 5.1 mmol/L Final  . Chloride 01/26/2016 102  101 - 111 mmol/L Final  . CO2 01/26/2016 25  22 - 32 mmol/L Final  . Glucose, Bld 01/26/2016 219* 65 - 99 mg/dL Final  . BUN 01/26/2016 24* 6 - 20 mg/dL Final  . Creatinine, Ser 01/26/2016 0.85  0.44 - 1.00 mg/dL Final  . Calcium 01/26/2016 8.6* 8.9 - 10.3 mg/dL Final  . GFR calc non Af Amer 01/26/2016 >60  >60 mL/min Final  . GFR calc Af Amer 01/26/2016 >60  >60 mL/min Final   Comment: (NOTE) The eGFR has been calculated using the CKD EPI equation. This calculation has not been validated in all clinical situations. eGFR's persistently <60 mL/min signify possible Chronic Kidney Disease.   . Anion gap 01/26/2016 10  5 - 15 Final  . Glucose-Capillary 01/25/2016 350* 65 - 99 mg/dL Final  . Glucose-Capillary 01/25/2016 315* 65 - 99 mg/dL Final  . Comment 1 01/25/2016 Notify RN   Final  . Comment 2 01/25/2016 Document in Chart   Final  . Glucose-Capillary 01/26/2016 174* 65 - 99 mg/dL Final  . Glucose-Capillary 01/26/2016 237* 65 - 99 mg/dL Final  Hospital Outpatient Visit on 01/18/2016  Component Date Value Ref Range Status  . MRSA, PCR 01/18/2016 NEGATIVE  NEGATIVE Final  . Staphylococcus aureus 01/18/2016 POSITIVE* NEGATIVE Final   Comment:        The Xpert SA Assay (FDA approved for NASAL specimens in patients over 19 years of age), is one component of a comprehensive surveillance program.  Test performance has been validated by [facility] for patients greater than or equal to 3 year old. It is not intended to diagnose infection nor to guide or  monitor treatment.   Marland Kitchen aPTT 01/18/2016 39* 24 - 37 seconds Final   Comment:        IF BASELINE aPTT IS ELEVATED, SUGGEST PATIENT RISK ASSESSMENT BE USED TO DETERMINE APPROPRIATE ANTICOAGULANT THERAPY.   . Sodium 01/18/2016 139  135 - 145 mmol/L Final  . Potassium 01/18/2016 3.9  3.5 - 5.1 mmol/L Final  . Chloride 01/18/2016 102  101 - 111 mmol/L Final  . CO2 01/18/2016 26  22 - 32 mmol/L Final  . Glucose, Bld 01/18/2016 200*  65 - 99 mg/dL Final  . BUN 01/18/2016 16  6 - 20 mg/dL Final  . Creatinine, Ser 01/18/2016 0.87  0.44 - 1.00 mg/dL Final  . Calcium 01/18/2016 9.3  8.9 - 10.3 mg/dL Final  . Total Protein 01/18/2016 7.5  6.5 - 8.1 g/dL Final  . Albumin 01/18/2016 4.1  3.5 - 5.0 g/dL Final  . AST 01/18/2016 29  15 - 41 U/L Final  . ALT 01/18/2016 28  14 - 54 U/L Final  . Alkaline Phosphatase 01/18/2016 74  38 - 126 U/L Final  . Total Bilirubin 01/18/2016 0.7  0.3 - 1.2 mg/dL Final  . GFR calc non Af Amer 01/18/2016 >60  >60 mL/min Final  . GFR calc Af Amer 01/18/2016 >60  >60 mL/min Final   Comment: (NOTE) The eGFR has been calculated using the CKD EPI equation. This calculation has not been validated in all clinical situations. eGFR's persistently <60 mL/min signify possible Chronic Kidney Disease.   . Anion gap 01/18/2016 11  5 - 15 Final  . Prothrombin Time 01/18/2016 14.5  11.6 - 15.2 seconds Final  . INR 01/18/2016 1.11  0.00 - 1.49 Final  . ABO/RH(D) 01/18/2016 O POS   Final  . Antibody Screen 01/18/2016 NEG   Final  . Sample Expiration 01/18/2016 01/27/2016   Final  . Extend sample reason 01/18/2016 NO TRANSFUSIONS OR PREGNANCY IN THE PAST 3 MONTHS   Final  . Color, Urine 01/18/2016 YELLOW  YELLOW Final  . APPearance 01/18/2016 CLOUDY* CLEAR Final  . Specific Gravity, Urine 01/18/2016 1.017  1.005 - 1.030 Final  . pH 01/18/2016 5.5  5.0 - 8.0 Final  . Glucose, UA 01/18/2016 NEGATIVE  NEGATIVE mg/dL Final  . Hgb urine dipstick 01/18/2016 NEGATIVE  NEGATIVE Final    . Bilirubin Urine 01/18/2016 NEGATIVE  NEGATIVE Final  . Ketones, ur 01/18/2016 NEGATIVE  NEGATIVE mg/dL Final  . Protein, ur 01/18/2016 NEGATIVE  NEGATIVE mg/dL Final  . Nitrite 01/18/2016 NEGATIVE  NEGATIVE Final  . Leukocytes, UA 01/18/2016 NEGATIVE  NEGATIVE Final   MICROSCOPIC NOT DONE ON URINES WITH NEGATIVE PROTEIN, BLOOD, LEUKOCYTES, NITRITE, OR GLUCOSE <1000 mg/dL.  . ABO/RH(D) 01/18/2016 O POS   Final     X-Rays:No results found.  EKG: Orders placed or performed in visit on 12/31/09  . EKG 12-Lead  . EKG 12-Lead     Hospital Course: Ann [name] is a 65 y.o. who was admitted to [facility]. They were brought to the operating room on 01/24/2016 and underwent Procedure(s): RIGHT TOTAL KNEE ARTHROPLASTY.  Patient tolerated the procedure well and was later transferred to the recovery room and then to the orthopaedic floor for postoperative care.  They were given PO and IV analgesics for pain control following their surgery.  They were given 24 hours of postoperative antibiotics of  Anti-infectives    Start     Dose/Rate Route Frequency Ordered Stop   01/24/16 2130  vancomycin (VANCOCIN) IVPB 1000 mg/200 mL premix     1,000 mg 200 mL/hr over 60 Minutes Intravenous Every 12 hours 01/24/16 1243 01/24/16 2308   01/24/16 0600  vancomycin (VANCOCIN) 1,500 mg in sodium chloride 0.9 % 500 mL IVPB     1,500 mg 250 mL/hr over 120 Minutes Intravenous On call to O.R. 01/23/16 1356 01/24/16 1032     and started on DVT prophylaxis in the form of Pradaxa.   PT and OT were ordered for total joint protocol.  Discharge planning consulted to help with  postop disposition and equipment needs.  Patient had an "awful night" due to lack of sleep night on the evening of surgery but she did walk 60 feet thse day of surgery.  They started to get up OOB with therapy on day one. Hemovac drain was pulled without difficulty.  Continued to work with therapy into day two.  Dressing was changed on day  two and the incision was healing well.  Patient was seen in rounds on POD 2 and was ready to go home.  Discharge home with home health Diet - Cardiac diet and Diabetic diet Follow up - in 2 weeks Activity - WBAT Disposition - Home Condition Upon Discharge - Good D/C Meds - See DC Summary DVT Prophylaxis - Resumed Pradaxa      Discharge Instructions    Call MD / Call 911    Complete by:  As directed   If you experience chest pain or shortness of breath, CALL 911 and be transported to the hospital emergency room.  If you develope a fever above 101 F, pus (white drainage) or increased drainage or redness at the wound, or calf pain, call your surgeon's office.     Change dressing    Complete by:  As directed   Change dressing daily with sterile 4 x 4 inch gauze dressing and apply TED hose. Do not submerge the incision under water.     Constipation Prevention    Complete by:  As directed   Drink plenty of fluids.  Prune juice may be helpful.  You may use a stool softener, such as Colace (over the counter) 100 mg twice a day.  Use MiraLax (over the counter) for constipation as needed.     Diet - low sodium heart healthy    Complete by:  As directed      Diet Carb Modified    Complete by:  As directed      Discharge instructions    Complete by:  As directed   Pick up stool softner and laxative for home use following surgery while on pain medications. Do not submerge incision under water. Please use good hand washing techniques while changing dressing each day. May shower starting three days after surgery. Please use a clean towel to pat the incision dry following showers. Continue to use ice for pain and swelling after surgery. Do not use any lotions or creams on the incision until instructed by your surgeon.  Resume the Pradaxa 150 mg dosing at home at time of discharge.  Postoperative Constipation Protocol  Constipation - defined medically as fewer than three stools per week and  severe constipation as less than one stool per week.  One of the most common issues patients have following surgery is constipation.  Even if you have a regular bowel pattern at home, your normal regimen is likely to be disrupted due to multiple reasons following surgery.  Combination of anesthesia, postoperative narcotics, change in appetite and fluid intake all can affect your bowels.  In order to avoid complications following surgery, here are some recommendations in order to help you during your recovery period.  Colace (docusate) - Pick up an over-the-counter form of Colace or another stool softener and take twice a day as long as you are requiring postoperative pain medications.  Take with a full glass of water daily.  If you experience loose stools or diarrhea, hold the colace until you stool forms back up.  If your symptoms do not get better within 1  week or if they get worse, check with your doctor.  Dulcolax (bisacodyl) - Pick up over-the-counter and take as directed by the product packaging as needed to assist with the movement of your bowels.  Take with a full glass of water.  Use this product as needed if not relieved by Colace only.   MiraLax (polyethylene glycol) - Pick up over-the-counter to have on hand.  MiraLax is a solution that will increase the amount of water in your bowels to assist with bowel movements.  Take as directed and can mix with a glass of water, juice, soda, coffee, or tea.  Take if you go more than two days without a movement. Do not use MiraLax more than once per day. Call your doctor if you are still constipated or irregular after using this medication for 7 days in a row.  If you continue to have problems with postoperative constipation, please contact the office for further assistance and recommendations.  If you experience "the worst abdominal pain ever" or develop nausea or vomiting, please contact the office immediatly for further recommendations for treatment.       Do not put a pillow under the knee. Place it under the heel.    Complete by:  As directed      Do not sit on low chairs, stoools or toilet seats, as it may be difficult to get up from low surfaces    Complete by:  As directed      Driving restrictions    Complete by:  As directed   No driving until released by the physician.     Increase activity slowly as tolerated    Complete by:  As directed      Lifting restrictions    Complete by:  As directed   No lifting until released by the physician.     Patient may shower    Complete by:  As directed   You may shower without a dressing once there is no drainage.  Do not wash over the wound.  If drainage remains, do not shower until drainage stops.     TED hose    Complete by:  As directed   Use stockings (TED hose) for 3 weeks on both leg(s).  You may remove them at night for sleeping.     Weight bearing as tolerated    Complete by:  As directed   Laterality:  right  Extremity:  Lower            Medication List    TAKE these medications        cetirizine 10 MG tablet  Commonly known as:  ZYRTEC  Take 10 mg by mouth daily as needed for allergies (cough).     dabigatran 150 MG Caps capsule  Commonly known as:  PRADAXA  Take 150 mg by mouth 2 (two) times daily.     diphenhydrAMINE 25 mg capsule  Commonly known as:  BENADRYL  Take 25 mg by mouth every 6 (six) hours as needed for itching.     EPIPEN 2-PAK 0.3 mg/0.3 mL Soaj injection  Generic drug:  EPINEPHrine  Inject 0.3 mg into the skin as needed. Bee stings     furosemide 20 MG tablet  Commonly known as:  LASIX  Take 20 mg by mouth daily as needed for edema.     HYDROcodone-acetaminophen 7.5-325 MG tablet  Commonly known as:  NORCO  Take 1-2 tablets by mouth every 4 (four) hours as needed for  moderate pain or severe pain.     insulin aspart 100 UNIT/ML injection  Commonly known as:  novoLOG  Inject 20-24 Units into the skin 3 (three) times daily before meals.  Sliding scale based on blood sugar     insulin glargine 100 UNIT/ML injection  Commonly known as:  LANTUS  Inject 75 Units into the skin every morning.     levothyroxine 50 MCG tablet  Commonly known as:  SYNTHROID, LEVOTHROID  Take 50 mcg by mouth daily before breakfast.     metFORMIN 500 MG 24 hr tablet  Commonly known as:  GLUCOPHAGE-XR  Take 500 mg by mouth 2 (two) times daily.     methocarbamol 500 MG tablet  Commonly known as:  ROBAXIN  Take 1 tablet (500 mg total) by mouth every 6 (six) hours as needed for muscle spasms.     metoprolol 50 MG tablet  Commonly known as:  LOPRESSOR  Take 50 mg by mouth 2 (two) times daily.     olmesartan-hydrochlorothiazide 40-25 MG tablet  Commonly known as:  BENICAR HCT  Take 1 tablet by mouth daily.     ranitidine 150 MG tablet  Commonly known as:  ZANTAC  Take 150 mg by mouth at bedtime as needed for heartburn.     traMADol 50 MG tablet  Commonly known as:  ULTRAM  Take 1-2 tablets (50-100 mg total) by mouth every 6 (six) hours as needed for moderate pain.     zolpidem 10 MG tablet  Commonly known as:  AMBIEN  Take 10 mg by mouth at bedtime.       Follow-up Information    Follow up with [facility].   Why:  home health physical therapy   Contact information:   [place] [address] [phone]       Follow up with Gearlean Alf, MD. Schedule an appointment as soon as possible for a visit on 02/08/2016.   Specialty:  Orthopedic Surgery   Why:  Call office at [phone] to setup appointment on Tuesday 02/08/2016 with Dr. Wynelle Link.   Contact information:   [address] [place] [address] [phone]       Signed: Arlee Muslim, PA-C Orthopaedic Surgery 01/27/2016, 9:04 AM  \

## 2016-01-25 NOTE — Progress Notes (Signed)
Physical Therapy Treatment Patient Details Name: Ann [name] MRN: [id number] DOB: 03/10/1951 Today's Date: 01/25/2016    History of Present Illness RTKA    PT Comments    POD # 1 am session.  Pt OOB in recliner.  Applied KI and instructed on use.  Assisted with amb in hallway.  Then performed TKR TE's followed by ICE.    Follow Up Recommendations  Home health PT;Supervision - Intermittent     Equipment Recommendations  None recommended by PT    Recommendations for Other Services       Precautions / Restrictions Precautions Precautions: Knee Precaution Comments: instructed on KI use for amb Required Braces or Orthoses: Knee Immobilizer - Right Knee Immobilizer - Right: Discontinue once straight leg raise with < 10 degree lag Restrictions Weight Bearing Restrictions: No Other Position/Activity Restrictions: WBAT    Mobility  Bed Mobility         Supine to sit: Supervision     General bed mobility comments: Pt OOB in recliner  Transfers Overall transfer level: Needs assistance Equipment used: Rolling walker (2 wheeled) Transfers: Sit to/from Stand Sit to Stand: Min guard         General transfer comment: steadying assistance. Cues for UE/lE placement  Ambulation/Gait Ambulation/Gait assistance: Min guard;Min assist Ambulation Distance (Feet): 55 Feet Assistive device: Rolling walker (2 wheeled) Gait Pattern/deviations: Step-to pattern;Decreased stance time - right     General Gait Details: cues for sequence, and posture   Stairs            Wheelchair Mobility    Modified Rankin (Stroke Patients Only)       Balance                                    Cognition Arousal/Alertness: Awake/alert Behavior During Therapy: WFL for tasks assessed/performed Overall Cognitive Status: Within Functional Limits for tasks assessed                      Exercises   Total Knee Replacement TE's 10 reps B LE ankle pumps 10  reps towel squeezes 10 reps knee presses 10 reps heel slides  10 reps SAQ's 10 reps SLR's 10 reps ABD Followed by ICE    General Comments        Pertinent Vitals/Pain Pain Assessment: 0-10 Pain Score: 4  Pain Location: R knee Pain Descriptors / Indicators: Aching;Sore Pain Intervention(s): Monitored during session;Premedicated before session;Repositioned;Ice applied    Home Living Family/patient expects to be discharged to:: Private residence Living Arrangements: Alone Available Help at Discharge: Friend(s)         Home Equipment: Grab bars - toilet;Shower seat;Grab bars - tub/shower Additional Comments: reports and 65 year old will be staying with her.    Prior Function Level of Independence: Independent          PT Goals (current goals can now be found in the care plan section) Acute Rehab PT Goals Patient Stated Goal: to go home Progress towards PT goals: Progressing toward goals    Frequency  7X/week    PT Plan Current plan remains appropriate    Co-evaluation             End of Session Equipment Utilized During Treatment: Right knee immobilizer Activity Tolerance: Patient tolerated treatment well Patient left: in chair;with call bell/phone within reach     Time: 1005-1030 PT Time Calculation (min) (ACUTE  ONLY): 25 min  Charges:  $Gait Training: 8-22 mins $Therapeutic Exercise: 8-22 mins                    G Codes:      Ann [name]  PTA [facility]  Acute  Rehab Pager      [phone]

## 2016-01-25 NOTE — Progress Notes (Signed)
   Subjective: 1 Day Post-Op Procedure(s) (LRB): RIGHT TOTAL KNEE ARTHROPLASTY (Right) Patient reports pain as moderate.  Patient had an "awful night" due to lack of sleep. Patient seen in rounds with Dr. Wynelle Link. Patient is well, but has had some minor complaints of pain in the knee, requiring pain medications We will resume therapy today. She walked 60 feet yesterday Plan is to go Home after hospital stay.  Objective: Vital signs in last 24 hours: Temp:  [97.4 F (36.3 C)-98.5 F (36.9 C)] 97.7 F (36.5 C) (02/14 0656) Pulse Rate:  [47-125] 84 (02/14 0656) Resp:  [10-19] 16 (02/14 0656) BP: (75-152)/(51-90) 124/71 mmHg (02/14 0656) SpO2:  [93 %-100 %] 96 % (02/14 0656) Weight:  [122.018 kg (269 lb)] 122.018 kg (269 lb) (02/13 1230)  Intake/Output from previous day:  Intake/Output Summary (Last 24 hours) at 01/25/16 0923 Last data filed at 01/25/16 0656  Gross per 24 hour  Intake   4945 ml  Output   3095 ml  Net   1850 ml    Intake/Output this shift: UOP 1550 since around MN  Labs:  Recent Labs  01/25/16 0418  HGB 12.2    Recent Labs  01/25/16 0418  WBC 9.2  RBC 3.97  HCT 36.4  PLT 169    Recent Labs  01/25/16 0418  NA 137  K 4.2  CL 105  CO2 21*  BUN 20  CREATININE 0.99  GLUCOSE 272*  CALCIUM 8.1*   No results for input(s): LABPT, INR in the last 72 hours.  EXAM General - Patient is Alert, Appropriate and Oriented Extremity - Neurovascular intact Sensation intact distally Dorsiflexion/Plantar flexion intact Dressing - dressing C/D/I Motor Function - intact, moving foot and toes well on exam.  Hemovac pulled without difficulty.  Past Medical History  Diagnosis Date  . Complication of anesthesia     sensitive to narcotics"extra sleepy". Versed- "no memory recall for 4 days postop"  . Cancer [facility])     Colon cancer '01, tx. radiation, chemo-  [facility]  . Dysrhythmia     hx. Atrial Fibrillation"chronic"- [facility]. [facility])  . Hypertension   . Sleep apnea     unable to tolreate Bipap.  Marland Kitchen Hypothyroidism   . Arthritis     osteoarthritis- knees.  . Hepatitis     "sub clinical case of Hepatitis in late 70's""has immunities"  . Diabetes mellitus without complication (HCC)     oral and Insulin  . 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    Assessment/Plan: 1 Day Post-Op Procedure(s) (LRB): RIGHT TOTAL KNEE ARTHROPLASTY (Right) Principal Problem:   OA (osteoarthritis) of knee  Estimated body mass index is 40.91 kg/(m^2) as calculated from the following:   Height as of this encounter: 5\' 8"  (1.727 m).   Weight as of this encounter: 122.018 kg (269 lb). Advance diet Up with therapy Plan for discharge tomorrow Discharge home with home health  DVT Prophylaxis - Resumed Pradaxa twice a day Weight-Bearing as tolerated to right leg D/C O2 and Pulse OX and try on Room Air Stop the scheduled Tylenol Changed to Norco 7.5 Postop HGB 12.2  Arlee Muslim, PA-C Orthopaedic Surgery 01/25/2016, 9:23 AM

## 2016-01-25 NOTE — Discharge Instructions (Signed)
° °Dr. Frank Aluisio °Total Joint Specialist °[facility] °[address] °[place], [place] [address] °[phone] ° °TOTAL KNEE REPLACEMENT POSTOPERATIVE DIRECTIONS ° °Knee Rehabilitation, Guidelines Following Surgery  °Results after knee surgery are often greatly improved when you follow the exercise, range of motion and muscle strengthening exercises prescribed by your doctor. Safety measures are also important to protect the knee from further injury. Any time any of these exercises cause you to have increased pain or swelling in your knee joint, decrease the amount until you are comfortable again and slowly increase them. If you have problems or questions, call your caregiver or physical therapist for advice.  ° °HOME CARE INSTRUCTIONS  °Remove items at home which could result in a fall. This includes throw rugs or furniture in walking pathways.  °· ICE to the affected knee every three hours for 30 minutes at a time and then as needed for pain and swelling.  Continue to use ice on the knee for pain and swelling from surgery. You may notice swelling that will progress down to the foot and ankle.  This is normal after surgery.  Elevate the leg when you are not up walking on it.   °· Continue to use the breathing machine which will help keep your temperature down.  It is common for your temperature to cycle up and down following surgery, especially at night when you are not up moving around and exerting yourself.  The breathing machine keeps your lungs expanded and your temperature down. °· Do not place pillow under knee, focus on keeping the knee straight while resting ° °DIET °You may resume your previous home diet once your are discharged from the hospital. ° °DRESSING / WOUND CARE / SHOWERING °You may shower 3 days after surgery, but keep the wounds dry during showering.  You may use an occlusive plastic wrap (Press'n Seal for example), NO SOAKING/SUBMERGING IN THE BATHTUB.  If the  bandage gets wet, change with a clean dry gauze.  If the incision gets wet, pat the wound dry with a clean towel. °You may start showering once you are discharged home but do not submerge the incision under water. Just pat the incision dry and apply a dry gauze dressing on daily. °Change the surgical dressing daily and reapply a dry dressing each time. ° °ACTIVITY °Walk with your walker as instructed. °Use walker as long as suggested by your caregivers. °Avoid periods of inactivity such as sitting longer than an hour when not asleep. This helps prevent blood clots.  °You may resume a sexual relationship in one month or when given the OK by your doctor.  °You may return to work once you are cleared by your doctor.  °Do not drive a car for 6 weeks or until released by you surgeon.  °Do not drive while taking narcotics. ° °WEIGHT BEARING °Weight bearing as tolerated with assist device (walker, cane, etc) as directed, use it as long as suggested by your surgeon or therapist, typically at least 4-6 weeks. ° °POSTOPERATIVE CONSTIPATION PROTOCOL °Constipation - defined medically as fewer than three stools per week and severe constipation as less than one stool per week. ° °One of the most common issues patients have following surgery is constipation.  Even if you have a regular bowel pattern at home, your normal regimen is likely to be disrupted due to multiple reasons following surgery.  Combination of anesthesia, postoperative narcotics, change in appetite and fluid intake all can affect your bowels.    In order to avoid complications following surgery, here are some recommendations in order to help you during your recovery period. ° °Colace (docusate) - Pick up an over-the-counter form of Colace or another stool softener and take twice a day as long as you are requiring postoperative pain medications.  Take with a full glass of water daily.  If you experience loose stools or diarrhea, hold the colace until you stool forms  back up.  If your symptoms do not get better within 1 week or if they get worse, check with your doctor. ° °Dulcolax (bisacodyl) - Pick up over-the-counter and take as directed by the product packaging as needed to assist with the movement of your bowels.  Take with a full glass of water.  Use this product as needed if not relieved by Colace only.  ° °MiraLax (polyethylene glycol) - Pick up over-the-counter to have on hand.  MiraLax is a solution that will increase the amount of water in your bowels to assist with bowel movements.  Take as directed and can mix with a glass of water, juice, soda, coffee, or tea.  Take if you go more than two days without a movement. °Do not use MiraLax more than once per day. Call your doctor if you are still constipated or irregular after using this medication for 7 days in a row. ° °If you continue to have problems with postoperative constipation, please contact the office for further assistance and recommendations.  If you experience "the worst abdominal pain ever" or develop nausea or vomiting, please contact the office immediatly for further recommendations for treatment. ° °ITCHING ° If you experience itching with your medications, try taking only a single pain pill, or even half a pain pill at a time.  You can also use Benadryl over the counter for itching or also to help with sleep.  ° °TED HOSE STOCKINGS °Wear the elastic stockings on both legs for three weeks following surgery during the day but you may remove then at night for sleeping. ° °MEDICATIONS °See your medication summary on the “After Visit Summary” that the nursing staff will review with you prior to discharge.  You may have some home medications which will be placed on hold until you complete the course of blood thinner medication.  It is important for you to complete the blood thinner medication as prescribed by your surgeon.  Continue your approved medications as instructed at time of  discharge. ° °PRECAUTIONS °If you experience chest pain or shortness of breath - call 911 immediately for transfer to the hospital emergency department.  °If you develop a fever greater that 101 F, purulent drainage from wound, increased redness or drainage from wound, foul odor from the wound/dressing, or calf pain - CONTACT YOUR SURGEON.   °                                                °FOLLOW-UP APPOINTMENTS °Make sure you keep all of your appointments after your operation with your surgeon and caregivers. You should call the office at the above phone number and make an appointment for approximately two weeks after the date of your surgery or on the date instructed by your surgeon outlined in the "After Visit Summary". ° ° °RANGE OF MOTION AND STRENGTHENING EXERCISES  °Rehabilitation of the knee is important following a knee injury or   an operation. After just a few days of immobilization, the muscles of the thigh which control the knee become weakened and shrink (atrophy). Knee exercises are designed to build up the tone and strength of the thigh muscles and to improve knee motion. Often times heat used for twenty to thirty minutes before working out will loosen up your tissues and help with improving the range of motion but do not use heat for the first two weeks following surgery. These exercises can be done on a training (exercise) mat, on the floor, on a table or on a bed. Use what ever works the best and is most comfortable for you Knee exercises include:  Leg Lifts - While your knee is still immobilized in a splint or cast, you can do straight leg raises. Lift the leg to 60 degrees, hold for 3 sec, and slowly lower the leg. Repeat 10-20 times 2-3 times daily. Perform this exercise against resistance later as your knee gets better.  Quad and Hamstring Sets - Tighten up the muscle on the front of the thigh (Quad) and hold for 5-10 sec. Repeat this 10-20 times hourly. Hamstring sets are done by pushing the  foot backward against an object and holding for 5-10 sec. Repeat as with quad sets.   Leg Slides: Lying on your back, slowly slide your foot toward your buttocks, bending your knee up off the floor (only go as far as is comfortable). Then slowly slide your foot back down until your leg is flat on the floor again.  Angel Wings: Lying on your back spread your legs to the side as far apart as you can without causing discomfort.  A rehabilitation program following serious knee injuries can speed recovery and prevent re-injury in the future due to weakened muscles. Contact your doctor or a physical therapist for more information on knee rehabilitation.   IF YOU ARE TRANSFERRED TO A SKILLED REHAB FACILITY If the patient is transferred to a skilled rehab facility following release from the hospital, a list of the current medications will be sent to the facility for the patient to continue.  When discharged from the skilled rehab facility, please have the facility set up the patient's [place] prior to being released. Also, the skilled facility will be responsible for providing the patient with their medications at time of release from the facility to include their pain medication, the muscle relaxants, and their blood thinner medication. If the patient is still at the rehab facility at time of the two week follow up appointment, the skilled rehab facility will also need to assist the patient in arranging follow up appointment in our office and any transportation needs.  MAKE SURE YOU:  Understand these instructions.  Get help right away if you are not doing well or get worse.    Pick up stool softner and laxative for home use following surgery while on pain medications. Do not submerge incision under water. Please use good hand washing techniques while changing dressing each day. May shower starting three days after surgery. Please use a clean towel to pat the incision dry following  showers. Continue to use ice for pain and swelling after surgery. Do not use any lotions or creams on the incision until instructed by your surgeon.\  Resume the Pradaxa 150 mg dosing at home at time of discharge.

## 2016-01-25 NOTE — Care Management Note (Signed)
Case Management Note  Patient Details  Name: Ann [name] MRN: [id number] Date of Birth: 07-19-1951  Subjective/Objective:                  RIGHT TOTAL KNEE ARTHROPLASTY (Right) Action/Plan: Discharge planning Expected Discharge Date:  01/26/16              Expected Discharge Plan:  [place]  In-House Referral:     Discharge planning Services  CM Consult  Post Acute Care Choice:    Choice offered to:     DME Arranged:  N/A DME Agency:  NA  HH Arranged:  PT HH Agency:  [facility]  Status of Service:  Completed, signed off  Medicare Important Message Given:    Date Medicare IM Given:    Medicare IM give by:    Date Additional Medicare IM Given:    Additional Medicare Important Message give by:     If discussed at [place] of Stay Meetings, dates discussed:    Additional Comments: CM met with pt in room to offer choice of home health agency.  Pt chooses [facility] to render HHPT.  Referral given to [facility] rep tim, (on unit).  Pt has rolling walker and safety commode at home.  No other CM needs were communicated. Dellie Catholic, RN 01/25/2016, 11:57 AM

## 2016-01-25 NOTE — Progress Notes (Signed)
Physical Therapy Treatment Patient Details Name: KANASIA OLEKSA MRN: [id number] DOB: July 06, 1951 Today's Date: 01/25/2016    History of Present Illness RTKA    PT Comments    POD # 1 pm session.  Assisted with amb in hallway a second time with mild c/o fatigue(poor sleep last night).  Assisted to recliner.  Follow Up Recommendations  Home health PT;Supervision - Intermittent     Equipment Recommendations  None recommended by PT    Recommendations for Other Services       Precautions / Restrictions Precautions Precautions: Knee Precaution Comments: instructed on KI use for amb Required Braces or Orthoses: Knee Immobilizer - Right Knee Immobilizer - Right: Discontinue once straight leg raise with < 10 degree lag Restrictions Weight Bearing Restrictions: No Other Position/Activity Restrictions: WBAT    Mobility  Bed Mobility               General bed mobility comments: Pt OOB in recliner  Transfers Overall transfer level: Needs assistance Equipment used: Rolling walker (2 wheeled) Transfers: Sit to/from Stand Sit to Stand: Min guard         General transfer comment: steadying assistance. Cues for UE/lE placement  Ambulation/Gait Ambulation/Gait assistance: Min guard;Min assist Ambulation Distance (Feet): 55 Feet Assistive device: Rolling walker (2 wheeled) Gait Pattern/deviations: Step-to pattern;Decreased stance time - right     General Gait Details: cues for sequence, and posture   Stairs            Wheelchair Mobility    Modified Rankin (Stroke Patients Only)       Balance                                    Cognition Arousal/Alertness: Awake/alert Behavior During Therapy: WFL for tasks assessed/performed Overall Cognitive Status: Within Functional Limits for tasks assessed                      Exercises      General Comments        Pertinent Vitals/Pain Pain Assessment: 0-10 Pain Score: 4  Pain  Location: R knee Pain Descriptors / Indicators: Aching;Sore Pain Intervention(s): Monitored during session;Premedicated before session;Repositioned;Ice applied    Home Living                      Prior Function            PT Goals (current goals can now be found in the care plan section) Progress towards PT goals: Progressing toward goals    Frequency  7X/week    PT Plan Current plan remains appropriate    Co-evaluation             End of Session Equipment Utilized During Treatment: Right knee immobilizer Activity Tolerance: Patient tolerated treatment well Patient left: in chair;with call bell/phone within reach     Time: 1330-1345 PT Time Calculation (min) (ACUTE ONLY): 15 min  Charges:  $Gait Training: 8-22 mins                     G Codes:      Rica Koyanagi  PTA [facility]  Acute  Rehab Pager      [phone]

## 2016-01-25 NOTE — Evaluation (Signed)
Occupational Therapy Evaluation Patient Details Name: Ann [name] MRN: [id number] DOB: 08-12-1951 Today's Date: 01/25/2016    History of Present Illness RTKA   Clinical Impression   This 65 year old female was admitted for the above. She will benefit from continued OT in acute to increase safety and independence with adls.  Will further assess toilet DME also    Follow Up Recommendations  Supervision/Assistance - 24 hour    Equipment Recommendations  None recommended by OT (likely)    Recommendations for Other Services       Precautions / Restrictions Precautions Precautions: Knee Required Braces or Orthoses: Knee Immobilizer - Right Knee Immobilizer - Right: Discontinue once straight leg raise with < 10 degree lag Restrictions Weight Bearing Restrictions: No      Mobility Bed Mobility         Supine to sit: Supervision     General bed mobility comments: manages R leg  she has a leg lifter at home also  Transfers   Equipment used: Rolling walker (2 wheeled) Transfers: Sit to/from Stand Sit to Stand: Min assist         General transfer comment: steadying assistance. Cues for UE/lE placement    Balance                                            ADL Overall ADL's : Needs assistance/impaired     Grooming: Wash/dry hands;Standing;Supervision/safety       Lower Body Bathing: Minimal assistance;Sit to/from stand       Lower Body Dressing: Minimal assistance;Sit to/from stand   Toilet Transfer: Minimal assistance;Ambulation;BSC;RW             General ADL Comments: completed ADL and ambulated to bathroom and used commode.  Pt has AE kit--she borrowed sock aide. Did not use on this visit. Reviewed precautions     Vision     Perception     Praxis      Pertinent Vitals/Pain Pain Score: 7  Pain Location: R knee Pain Descriptors / Indicators: Aching Pain Intervention(s): Limited activity within patient's  tolerance;Monitored during session;Repositioned;Ice applied;Patient requesting pain meds-RN notified     Hand Dominance     Extremity/Trunk Assessment Upper Extremity Assessment Upper Extremity Assessment: Overall WFL for tasks assessed           Communication Communication Communication: No difficulties   Cognition Arousal/Alertness: Awake/alert Behavior During Therapy: WFL for tasks assessed/performed Overall Cognitive Status: Within Functional Limits for tasks assessed                     General Comments       Exercises       Shoulder Instructions      Home Living Family/patient expects to be discharged to:: Private residence Living Arrangements: Alone Available Help at Discharge: Friend(s)               Bathroom Shower/Tub: Walk-in Corporate treasurer Toilet: Handicapped height     Home Equipment: Grab bars - toilet;Shower seat;Grab bars - tub/shower   Additional Comments: reports and 65 year old will be staying with her.      Prior Functioning/Environment Level of Independence: Independent             OT Diagnosis: Acute pain   OT Problem List: Decreased strength;Decreased activity tolerance;Decreased knowledge of use of DME  or AE;Pain   OT Treatment/Interventions: Self-care/ADL training;DME and/or AE instruction;Patient/family education    OT Goals(Current goals can be found in the care plan section) Acute Rehab OT Goals Patient Stated Goal: to go home OT Goal Formulation: With patient Time For Goal Achievement: 02/01/16 Potential to Achieve Goals: Good ADL Goals Pt Will Perform Lower Body Dressing: with supervision;with adaptive equipment;sit to/from stand Pt Will Transfer to Toilet: with supervision;ambulating;grab bars (high commode) Pt Will Perform Tub/Shower Transfer: Shower transfer;with supervision;shower seat;ambulating  OT Frequency: Min 2X/week   Barriers to D/C:            Co-evaluation              End of  Session    Activity Tolerance: Patient tolerated treatment well Patient left: in chair;with call bell/phone within reach   Time: 0812-0854 OT Time Calculation (min): 42 min Charges:  OT General Charges $OT Visit: 1 Procedure OT Evaluation $OT Eval Low Complexity: 1 Procedure OT Treatments $Self Care/Home Management : 23-37 mins G-Codes:    Bladyn Tipps 2016/02/01, 9:12 AM  Lesle Chris, OTR/L [phone] 02-01-2016

## 2016-01-26 LAB — CBC
HEMATOCRIT: 36.1 % (ref 36.0–46.0)
Hemoglobin: 12.1 g/dL (ref 12.0–15.0)
MCH: 31 pg (ref 26.0–34.0)
MCHC: 33.5 g/dL (ref 30.0–36.0)
MCV: 92.6 fL (ref 78.0–100.0)
PLATELETS: 188 10*3/uL (ref 150–400)
RBC: 3.9 MIL/uL (ref 3.87–5.11)
RDW: 13.4 % (ref 11.5–15.5)
WBC: 9.9 10*3/uL (ref 4.0–10.5)

## 2016-01-26 LAB — BASIC METABOLIC PANEL
ANION GAP: 10 (ref 5–15)
BUN: 24 mg/dL — ABNORMAL HIGH (ref 6–20)
CALCIUM: 8.6 mg/dL — AB (ref 8.9–10.3)
CO2: 25 mmol/L (ref 22–32)
CREATININE: 0.85 mg/dL (ref 0.44–1.00)
Chloride: 102 mmol/L (ref 101–111)
Glucose, Bld: 219 mg/dL — ABNORMAL HIGH (ref 65–99)
Potassium: 3.6 mmol/L (ref 3.5–5.1)
Sodium: 137 mmol/L (ref 135–145)

## 2016-01-26 LAB — GLUCOSE, CAPILLARY
GLUCOSE-CAPILLARY: 174 mg/dL — AB (ref 65–99)
Glucose-Capillary: 237 mg/dL — ABNORMAL HIGH (ref 65–99)

## 2016-01-26 NOTE — Progress Notes (Signed)
   Subjective: 2 Days Post-Op Procedure(s) (LRB): RIGHT TOTAL KNEE ARTHROPLASTY (Right) Patient reports pain as mild.   Patient seen in rounds for Dr. Wynelle Link. Patient is well, and has had no acute complaints or problems Patient is ready to go home  Objective: Vital signs in last 24 hours: Temp:  [97.3 F (36.3 C)-98.4 F (36.9 C)] 97.3 F (36.3 C) (02/15 0409) Pulse Rate:  [62-92] 73 (02/15 1019) Resp:  [16-18] 16 (02/15 0409) BP: (130-142)/(73-91) 136/73 mmHg (02/15 1019) SpO2:  [94 %-98 %] 98 % (02/15 0409)  Intake/Output from previous day:  Intake/Output Summary (Last 24 hours) at 01/26/16 1038 Last data filed at 01/26/16 0850  Gross per 24 hour  Intake   1100 ml  Output   2500 ml  Net  -1400 ml    Intake/Output this shift: Total I/O In: 240 [P.O.:240] Out: -   Labs:  Recent Labs  01/25/16 0418 01/26/16 0359  HGB 12.2 12.1    Recent Labs  01/25/16 0418 01/26/16 0359  WBC 9.2 9.9  RBC 3.97 3.90  HCT 36.4 36.1  PLT 169 188    Recent Labs  01/25/16 0418 01/26/16 0359  NA 137 137  K 4.2 3.6  CL 105 102  CO2 21* 25  BUN 20 24*  CREATININE 0.99 0.85  GLUCOSE 272* 219*  CALCIUM 8.1* 8.6*   No results for input(s): LABPT, INR in the last 72 hours.  EXAM: General - Patient is Alert, Appropriate and Oriented Extremity - Neurovascular intact Sensation intact distally Dorsiflexion/Plantar flexion intact Incision - clean, dry, no drainage Motor Function - intact, moving foot and toes well on exam.   Assessment/Plan: 2 Days Post-Op Procedure(s) (LRB): RIGHT TOTAL KNEE ARTHROPLASTY (Right) Procedure(s) (LRB): RIGHT TOTAL KNEE ARTHROPLASTY (Right) Past Medical History  Diagnosis Date  . Complication of anesthesia     sensitive to narcotics"extra sleepy". Versed- "no memory recall for 4 days postop"  . Cancer [facility])     Colon cancer '01, tx. radiation, chemo-  [facility]  . Dysrhythmia     hx. Atrial Fibrillation"chronic"- Dr.  [facility])  . Hypertension   . Sleep apnea     unable to tolreate Bipap.  Marland Kitchen Hypothyroidism   . Arthritis     osteoarthritis- knees.  . Hepatitis     "sub clinical case of Hepatitis in late 70's""has immunities"  . Diabetes mellitus without complication (HCC)     oral and Insulin  . 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   Principal Problem:   OA (osteoarthritis) of knee  Estimated body mass index is 40.91 kg/(m^2) as calculated from the following:   Height as of this encounter: 5\' 8"  (1.727 m).   Weight as of this encounter: 122.018 kg (269 lb). Up with therapy Discharge home with home health Diet - Cardiac diet and Diabetic diet Follow up - in 2 weeks Activity - WBAT Disposition - Home Condition Upon Discharge - Good D/C Meds - See DC Summary DVT Prophylaxis - Resumed Pradaxa  Arlee Muslim, PA-C Orthopaedic Surgery 01/26/2016, 10:38 AM

## 2016-01-26 NOTE — Progress Notes (Signed)
Physical Therapy Treatment Patient Details Name: Ann [name] MRN: [id number] DOB: 01/03/1951 Today's Date: 01/26/2016    History of Present Illness RTKA    PT Comments     POD # 2  Pt eager to D/C to home.  Assisted with amb a greater distance and practiced stairs using one rail and one crutch.  Performed all supine TKR TE's followed by ICE.   Follow Up Recommendations  Home health PT;Supervision - Intermittent     Equipment Recommendations  None recommended by PT    Recommendations for Other Services       Precautions / Restrictions Precautions Precautions: Knee Precaution Comments: instructed on KI use for amb Required Braces or Orthoses: Knee Immobilizer - Right Restrictions Weight Bearing Restrictions: No Other Position/Activity Restrictions: WBAT    Mobility  Bed Mobility               General bed mobility comments: Pt OOB in recliner  Transfers Overall transfer level: Needs assistance Equipment used: Rolling walker (2 wheeled) Transfers: Sit to/from Stand Sit to Stand: Supervision         General transfer comment: good safety cognition  Ambulation/Gait Ambulation/Gait assistance: Supervision Ambulation Distance (Feet): 220 Feet Assistive device: Rolling walker (2 wheeled) Gait Pattern/deviations: Step-to pattern;Decreased stance time - right     General Gait Details: one VC on safety with turns   Stairs Stairs: Yes Stairs assistance: Min assist Stair Management: One rail Left;Forwards;With cane Number of Stairs: 4 General stair comments: practiced stairs using one rail and one cane with one VC on proper sequencing.    Wheelchair Mobility    Modified Rankin (Stroke Patients Only)       Balance                                    Cognition Arousal/Alertness: Awake/alert                          Exercises   Total Knee Replacement TE's 10 reps B LE ankle pumps 10 reps towel squeezes 10 reps knee  presses 10 reps heel slides  10 reps SAQ's 10 reps SLR's 10 reps ABD Followed by ICE     General Comments        Pertinent Vitals/Pain Pain Assessment: 0-10 Pain Score: 3  Pain Location: R knee Pain Intervention(s): Monitored during session;Premedicated before session;Repositioned;Ice applied    Home Living                      Prior Function            PT Goals (current goals can now be found in the care plan section) Progress towards PT goals: Progressing toward goals    Frequency  7X/week    PT Plan Current plan remains appropriate    Co-evaluation             End of Session Equipment Utilized During Treatment: Right knee immobilizer Activity Tolerance: Patient tolerated treatment well Patient left: in chair;with call bell/phone within reach     Time: 0900-0940 PT Time Calculation (min) (ACUTE ONLY): 40 min  Charges:  $Gait Training: 8-22 mins $Therapeutic Exercise: 8-22 mins $Therapeutic Activity: 8-22 mins                    G Codes:      Rica Koyanagi  PTA  [facility]  Acute  Rehab Pager      [phone]

## 2016-01-26 NOTE — Progress Notes (Signed)
Occupational Therapy Treatment Patient Details Name: Ann [name] MRN: [id number] DOB: 10-10-1951 Today's Date: 01/26/2016    History of present illness RTKA   OT comments  Pt is making good progress.  She plans d/c home today. Will not need follow up OT  Follow Up Recommendations  Supervision/Assistance - 24 hour    Equipment Recommendations  None recommended by OT    Recommendations for Other Services      Precautions / Restrictions Precautions Precautions: Knee       Mobility Bed Mobility                  Transfers   Equipment used: Rolling walker (2 wheeled)   Sit to Stand: Min guard         General transfer comment: for safety    Balance                                   ADL                           Toilet Transfer: Min guard;Ambulation (recliner)       Tub/ Shower Transfer: Gaffer;Ambulation;Min guard     General ADL Comments: pt used sock aide with 2 pairs of socks and reacher to straighten them.  Used long shoehorn for L shoe--it was a tight fit, so did not do R.  Pt has a built in shower seat and grab bar. Educated to take walker into stall if needed and have someone take it out while she showers.      Vision                     Perception     Praxis      Cognition   Behavior During Therapy: WFL for tasks assessed/performed Overall Cognitive Status: Within Functional Limits for tasks assessed                       Extremity/Trunk Assessment               Exercises     Shoulder Instructions       General Comments      Pertinent Vitals/ Pain       Pain Assessment: Faces Faces Pain Scale: Hurts little more Pain Location: R knee Pain Descriptors / Indicators: Sore Pain Intervention(s): Limited activity within patient's tolerance;Monitored during session;Premedicated before session;Repositioned  Home Living                                          Prior Functioning/Environment              Frequency       Progress Toward Goals  OT Goals(current goals can now be found in the care plan section)  Progress towards OT goals: Progressing toward goals (goals not met, but pt will not need continued OT upon dc)     Plan      Co-evaluation                 End of Session     Activity Tolerance Patient tolerated treatment well   Patient Left in chair;with call bell/phone within reach   Nurse Communication  Time: 1791-9957 OT Time Calculation (min): 23 min  Charges: OT General Charges $OT Visit: 1 Procedure OT Treatments $Self Care/Home Management : 8-22 mins  Eliezer Khawaja 01/26/2016, 12:03 PM   Lesle Chris, OTR/L [phone] 01/26/2016

## 2016-01-27 DIAGNOSIS — Z96651 Presence of right artificial knee joint: Secondary | ICD-10-CM | POA: Diagnosis not present

## 2016-01-27 DIAGNOSIS — E119 Type 2 diabetes mellitus without complications: Secondary | ICD-10-CM | POA: Diagnosis not present

## 2016-01-27 DIAGNOSIS — Z471 Aftercare following joint replacement surgery: Secondary | ICD-10-CM | POA: Diagnosis not present

## 2016-01-27 DIAGNOSIS — Z85038 Personal history of other malignant neoplasm of large intestine: Secondary | ICD-10-CM | POA: Diagnosis not present

## 2016-01-27 DIAGNOSIS — I482 Chronic atrial fibrillation: Secondary | ICD-10-CM | POA: Diagnosis not present

## 2016-01-27 DIAGNOSIS — I10 Essential (primary) hypertension: Secondary | ICD-10-CM | POA: Diagnosis not present

## 2016-01-28 DIAGNOSIS — E119 Type 2 diabetes mellitus without complications: Secondary | ICD-10-CM | POA: Diagnosis not present

## 2016-01-28 DIAGNOSIS — I10 Essential (primary) hypertension: Secondary | ICD-10-CM | POA: Diagnosis not present

## 2016-01-28 DIAGNOSIS — Z96651 Presence of right artificial knee joint: Secondary | ICD-10-CM | POA: Diagnosis not present

## 2016-01-28 DIAGNOSIS — I482 Chronic atrial fibrillation: Secondary | ICD-10-CM | POA: Diagnosis not present

## 2016-01-28 DIAGNOSIS — Z471 Aftercare following joint replacement surgery: Secondary | ICD-10-CM | POA: Diagnosis not present

## 2016-01-28 DIAGNOSIS — Z85038 Personal history of other malignant neoplasm of large intestine: Secondary | ICD-10-CM | POA: Diagnosis not present

## 2016-01-31 DIAGNOSIS — Z471 Aftercare following joint replacement surgery: Secondary | ICD-10-CM | POA: Diagnosis not present

## 2016-01-31 DIAGNOSIS — Z85038 Personal history of other malignant neoplasm of large intestine: Secondary | ICD-10-CM | POA: Diagnosis not present

## 2016-01-31 DIAGNOSIS — E119 Type 2 diabetes mellitus without complications: Secondary | ICD-10-CM | POA: Diagnosis not present

## 2016-01-31 DIAGNOSIS — Z96651 Presence of right artificial knee joint: Secondary | ICD-10-CM | POA: Diagnosis not present

## 2016-01-31 DIAGNOSIS — I482 Chronic atrial fibrillation: Secondary | ICD-10-CM | POA: Diagnosis not present

## 2016-01-31 DIAGNOSIS — I10 Essential (primary) hypertension: Secondary | ICD-10-CM | POA: Diagnosis not present

## 2016-02-02 DIAGNOSIS — I10 Essential (primary) hypertension: Secondary | ICD-10-CM | POA: Diagnosis not present

## 2016-02-02 DIAGNOSIS — Z96651 Presence of right artificial knee joint: Secondary | ICD-10-CM | POA: Diagnosis not present

## 2016-02-02 DIAGNOSIS — E119 Type 2 diabetes mellitus without complications: Secondary | ICD-10-CM | POA: Diagnosis not present

## 2016-02-02 DIAGNOSIS — I482 Chronic atrial fibrillation: Secondary | ICD-10-CM | POA: Diagnosis not present

## 2016-02-02 DIAGNOSIS — Z85038 Personal history of other malignant neoplasm of large intestine: Secondary | ICD-10-CM | POA: Diagnosis not present

## 2016-02-02 DIAGNOSIS — Z471 Aftercare following joint replacement surgery: Secondary | ICD-10-CM | POA: Diagnosis not present

## 2016-02-04 DIAGNOSIS — I10 Essential (primary) hypertension: Secondary | ICD-10-CM | POA: Diagnosis not present

## 2016-02-04 DIAGNOSIS — E119 Type 2 diabetes mellitus without complications: Secondary | ICD-10-CM | POA: Diagnosis not present

## 2016-02-04 DIAGNOSIS — Z96651 Presence of right artificial knee joint: Secondary | ICD-10-CM | POA: Diagnosis not present

## 2016-02-04 DIAGNOSIS — Z85038 Personal history of other malignant neoplasm of large intestine: Secondary | ICD-10-CM | POA: Diagnosis not present

## 2016-02-04 DIAGNOSIS — Z471 Aftercare following joint replacement surgery: Secondary | ICD-10-CM | POA: Diagnosis not present

## 2016-02-04 DIAGNOSIS — I482 Chronic atrial fibrillation: Secondary | ICD-10-CM | POA: Diagnosis not present

## 2016-02-07 DIAGNOSIS — M1711 Unilateral primary osteoarthritis, right knee: Secondary | ICD-10-CM | POA: Diagnosis not present

## 2016-02-07 DIAGNOSIS — M25561 Pain in right knee: Secondary | ICD-10-CM | POA: Diagnosis not present

## 2016-02-08 DIAGNOSIS — Z471 Aftercare following joint replacement surgery: Secondary | ICD-10-CM | POA: Diagnosis not present

## 2016-02-08 DIAGNOSIS — Z96651 Presence of right artificial knee joint: Secondary | ICD-10-CM | POA: Diagnosis not present

## 2016-02-09 DIAGNOSIS — M1711 Unilateral primary osteoarthritis, right knee: Secondary | ICD-10-CM | POA: Diagnosis not present

## 2016-02-09 DIAGNOSIS — M25561 Pain in right knee: Secondary | ICD-10-CM | POA: Diagnosis not present

## 2016-02-10 DIAGNOSIS — M1711 Unilateral primary osteoarthritis, right knee: Secondary | ICD-10-CM | POA: Diagnosis not present

## 2016-02-10 DIAGNOSIS — M25561 Pain in right knee: Secondary | ICD-10-CM | POA: Diagnosis not present

## 2016-02-14 DIAGNOSIS — M1711 Unilateral primary osteoarthritis, right knee: Secondary | ICD-10-CM | POA: Diagnosis not present

## 2016-02-14 DIAGNOSIS — M25561 Pain in right knee: Secondary | ICD-10-CM | POA: Diagnosis not present

## 2016-02-15 DIAGNOSIS — M25561 Pain in right knee: Secondary | ICD-10-CM | POA: Diagnosis not present

## 2016-02-15 DIAGNOSIS — M1711 Unilateral primary osteoarthritis, right knee: Secondary | ICD-10-CM | POA: Diagnosis not present

## 2016-02-17 DIAGNOSIS — M1711 Unilateral primary osteoarthritis, right knee: Secondary | ICD-10-CM | POA: Diagnosis not present

## 2016-02-17 DIAGNOSIS — M25561 Pain in right knee: Secondary | ICD-10-CM | POA: Diagnosis not present

## 2016-02-21 DIAGNOSIS — M1711 Unilateral primary osteoarthritis, right knee: Secondary | ICD-10-CM | POA: Diagnosis not present

## 2016-02-21 DIAGNOSIS — M25561 Pain in right knee: Secondary | ICD-10-CM | POA: Diagnosis not present

## 2016-02-22 DIAGNOSIS — M1711 Unilateral primary osteoarthritis, right knee: Secondary | ICD-10-CM | POA: Diagnosis not present

## 2016-02-22 DIAGNOSIS — M25561 Pain in right knee: Secondary | ICD-10-CM | POA: Diagnosis not present

## 2016-02-24 DIAGNOSIS — M25561 Pain in right knee: Secondary | ICD-10-CM | POA: Diagnosis not present

## 2016-02-24 DIAGNOSIS — M1711 Unilateral primary osteoarthritis, right knee: Secondary | ICD-10-CM | POA: Diagnosis not present

## 2016-02-28 DIAGNOSIS — M1711 Unilateral primary osteoarthritis, right knee: Secondary | ICD-10-CM | POA: Diagnosis not present

## 2016-02-28 DIAGNOSIS — M25561 Pain in right knee: Secondary | ICD-10-CM | POA: Diagnosis not present

## 2016-02-29 DIAGNOSIS — M25561 Pain in right knee: Secondary | ICD-10-CM | POA: Diagnosis not present

## 2016-02-29 DIAGNOSIS — M1711 Unilateral primary osteoarthritis, right knee: Secondary | ICD-10-CM | POA: Diagnosis not present

## 2016-03-02 DIAGNOSIS — M25561 Pain in right knee: Secondary | ICD-10-CM | POA: Diagnosis not present

## 2016-03-02 DIAGNOSIS — M1711 Unilateral primary osteoarthritis, right knee: Secondary | ICD-10-CM | POA: Diagnosis not present

## 2016-03-06 DIAGNOSIS — M25561 Pain in right knee: Secondary | ICD-10-CM | POA: Diagnosis not present

## 2016-03-06 DIAGNOSIS — M1711 Unilateral primary osteoarthritis, right knee: Secondary | ICD-10-CM | POA: Diagnosis not present

## 2016-03-07 DIAGNOSIS — M25561 Pain in right knee: Secondary | ICD-10-CM | POA: Diagnosis not present

## 2016-03-07 DIAGNOSIS — M1711 Unilateral primary osteoarthritis, right knee: Secondary | ICD-10-CM | POA: Diagnosis not present

## 2016-03-09 DIAGNOSIS — M1711 Unilateral primary osteoarthritis, right knee: Secondary | ICD-10-CM | POA: Diagnosis not present

## 2016-03-09 DIAGNOSIS — M25561 Pain in right knee: Secondary | ICD-10-CM | POA: Diagnosis not present

## 2016-03-10 DIAGNOSIS — Z96651 Presence of right artificial knee joint: Secondary | ICD-10-CM | POA: Diagnosis not present

## 2016-03-10 DIAGNOSIS — Z471 Aftercare following joint replacement surgery: Secondary | ICD-10-CM | POA: Diagnosis not present

## 2016-03-13 DIAGNOSIS — M25561 Pain in right knee: Secondary | ICD-10-CM | POA: Diagnosis not present

## 2016-03-13 DIAGNOSIS — M1711 Unilateral primary osteoarthritis, right knee: Secondary | ICD-10-CM | POA: Diagnosis not present

## 2016-03-16 DIAGNOSIS — M25561 Pain in right knee: Secondary | ICD-10-CM | POA: Diagnosis not present

## 2016-03-16 DIAGNOSIS — M1711 Unilateral primary osteoarthritis, right knee: Secondary | ICD-10-CM | POA: Diagnosis not present

## 2016-03-20 DIAGNOSIS — E1165 Type 2 diabetes mellitus with hyperglycemia: Secondary | ICD-10-CM | POA: Diagnosis not present

## 2016-03-20 DIAGNOSIS — Z7984 Long term (current) use of oral hypoglycemic drugs: Secondary | ICD-10-CM | POA: Diagnosis not present

## 2016-03-20 DIAGNOSIS — G4700 Insomnia, unspecified: Secondary | ICD-10-CM | POA: Diagnosis not present

## 2016-03-20 DIAGNOSIS — E782 Mixed hyperlipidemia: Secondary | ICD-10-CM | POA: Diagnosis not present

## 2016-03-20 DIAGNOSIS — Z794 Long term (current) use of insulin: Secondary | ICD-10-CM | POA: Diagnosis not present

## 2016-03-20 DIAGNOSIS — I10 Essential (primary) hypertension: Secondary | ICD-10-CM | POA: Diagnosis not present

## 2016-03-20 DIAGNOSIS — I4891 Unspecified atrial fibrillation: Secondary | ICD-10-CM | POA: Diagnosis not present

## 2016-03-20 DIAGNOSIS — E039 Hypothyroidism, unspecified: Secondary | ICD-10-CM | POA: Diagnosis not present

## 2016-03-20 DIAGNOSIS — F411 Generalized anxiety disorder: Secondary | ICD-10-CM | POA: Diagnosis not present

## 2016-03-30 DIAGNOSIS — M25561 Pain in right knee: Secondary | ICD-10-CM | POA: Diagnosis not present

## 2016-03-30 DIAGNOSIS — M1711 Unilateral primary osteoarthritis, right knee: Secondary | ICD-10-CM | POA: Diagnosis not present

## 2016-04-10 DIAGNOSIS — M1711 Unilateral primary osteoarthritis, right knee: Secondary | ICD-10-CM | POA: Diagnosis not present

## 2016-04-10 DIAGNOSIS — M25561 Pain in right knee: Secondary | ICD-10-CM | POA: Diagnosis not present

## 2016-04-18 DIAGNOSIS — Z471 Aftercare following joint replacement surgery: Secondary | ICD-10-CM | POA: Diagnosis not present

## 2016-04-18 DIAGNOSIS — Z96651 Presence of right artificial knee joint: Secondary | ICD-10-CM | POA: Diagnosis not present

## 2016-05-15 DIAGNOSIS — L821 Other seborrheic keratosis: Secondary | ICD-10-CM | POA: Diagnosis not present

## 2016-05-15 DIAGNOSIS — L814 Other melanin hyperpigmentation: Secondary | ICD-10-CM | POA: Diagnosis not present

## 2016-05-15 DIAGNOSIS — D1801 Hemangioma of skin and subcutaneous tissue: Secondary | ICD-10-CM | POA: Diagnosis not present

## 2016-05-15 DIAGNOSIS — D225 Melanocytic nevi of trunk: Secondary | ICD-10-CM | POA: Diagnosis not present

## 2016-05-15 DIAGNOSIS — Z85820 Personal history of malignant melanoma of skin: Secondary | ICD-10-CM | POA: Diagnosis not present

## 2016-07-21 ENCOUNTER — Ambulatory Visit
Admission: RE | Admit: 2016-07-21 | Discharge: 2016-07-21 | Disposition: A | Discharge status: Home / Self Care | Payer: Medicare Other | Source: Ambulatory Visit | Attending: Family Medicine | Admitting: Family Medicine

## 2016-07-21 ENCOUNTER — Other Ambulatory Visit: Payer: Self-pay | Admitting: Family Medicine

## 2016-07-21 DIAGNOSIS — K5730 Diverticulosis of large intestine without perforation or abscess without bleeding: Secondary | ICD-10-CM | POA: Diagnosis not present

## 2016-07-21 DIAGNOSIS — Z85038 Personal history of other malignant neoplasm of large intestine: Secondary | ICD-10-CM | POA: Diagnosis not present

## 2016-07-21 DIAGNOSIS — R1031 Right lower quadrant pain: Secondary | ICD-10-CM | POA: Diagnosis not present

## 2016-07-21 DIAGNOSIS — R140 Abdominal distension (gaseous): Secondary | ICD-10-CM | POA: Diagnosis not present

## 2016-07-21 DIAGNOSIS — R194 Change in bowel habit: Secondary | ICD-10-CM | POA: Diagnosis not present

## 2016-07-21 MED ORDER — IOPAMIDOL (ISOVUE-300) INJECTION 61%
100.0000 mL | Freq: Once | INTRAVENOUS | Status: AC | PRN
  Administered 2016-07-21: 100 mL via INTRAVENOUS

## 2016-08-01 DIAGNOSIS — E119 Type 2 diabetes mellitus without complications: Secondary | ICD-10-CM | POA: Diagnosis not present

## 2016-08-01 DIAGNOSIS — Z794 Long term (current) use of insulin: Secondary | ICD-10-CM | POA: Diagnosis not present

## 2016-08-01 DIAGNOSIS — H2513 Age-related nuclear cataract, bilateral: Secondary | ICD-10-CM | POA: Diagnosis not present

## 2016-08-29 DIAGNOSIS — Z1231 Encounter for screening mammogram for malignant neoplasm of breast: Secondary | ICD-10-CM | POA: Diagnosis not present

## 2016-09-19 DIAGNOSIS — E1165 Type 2 diabetes mellitus with hyperglycemia: Secondary | ICD-10-CM | POA: Diagnosis not present

## 2016-09-19 DIAGNOSIS — J309 Allergic rhinitis, unspecified: Secondary | ICD-10-CM | POA: Diagnosis not present

## 2016-09-19 DIAGNOSIS — Z23 Encounter for immunization: Secondary | ICD-10-CM | POA: Diagnosis not present

## 2016-09-19 DIAGNOSIS — E782 Mixed hyperlipidemia: Secondary | ICD-10-CM | POA: Diagnosis not present

## 2016-09-19 DIAGNOSIS — E669 Obesity, unspecified: Secondary | ICD-10-CM | POA: Diagnosis not present

## 2016-09-19 DIAGNOSIS — G4700 Insomnia, unspecified: Secondary | ICD-10-CM | POA: Diagnosis not present

## 2016-09-19 DIAGNOSIS — Z1159 Encounter for screening for other viral diseases: Secondary | ICD-10-CM | POA: Diagnosis not present

## 2016-09-19 DIAGNOSIS — E039 Hypothyroidism, unspecified: Secondary | ICD-10-CM | POA: Diagnosis not present

## 2016-09-19 DIAGNOSIS — I10 Essential (primary) hypertension: Secondary | ICD-10-CM | POA: Diagnosis not present

## 2017-01-18 DIAGNOSIS — Z96651 Presence of right artificial knee joint: Secondary | ICD-10-CM | POA: Diagnosis not present

## 2017-01-18 DIAGNOSIS — Z471 Aftercare following joint replacement surgery: Secondary | ICD-10-CM | POA: Diagnosis not present

## 2017-02-12 DIAGNOSIS — L218 Other seborrheic dermatitis: Secondary | ICD-10-CM | POA: Diagnosis not present

## 2017-02-12 DIAGNOSIS — L570 Actinic keratosis: Secondary | ICD-10-CM | POA: Diagnosis not present

## 2017-02-12 DIAGNOSIS — L821 Other seborrheic keratosis: Secondary | ICD-10-CM | POA: Diagnosis not present

## 2017-02-12 DIAGNOSIS — D225 Melanocytic nevi of trunk: Secondary | ICD-10-CM | POA: Diagnosis not present

## 2017-02-12 DIAGNOSIS — L814 Other melanin hyperpigmentation: Secondary | ICD-10-CM | POA: Diagnosis not present

## 2017-02-12 DIAGNOSIS — Z85820 Personal history of malignant melanoma of skin: Secondary | ICD-10-CM | POA: Diagnosis not present

## 2017-03-20 DIAGNOSIS — Z794 Long term (current) use of insulin: Secondary | ICD-10-CM | POA: Diagnosis not present

## 2017-03-20 DIAGNOSIS — Z0000 Encounter for general adult medical examination without abnormal findings: Secondary | ICD-10-CM | POA: Diagnosis not present

## 2017-03-20 DIAGNOSIS — Z1159 Encounter for screening for other viral diseases: Secondary | ICD-10-CM | POA: Diagnosis not present

## 2017-03-20 DIAGNOSIS — I10 Essential (primary) hypertension: Secondary | ICD-10-CM | POA: Diagnosis not present

## 2017-03-20 DIAGNOSIS — E039 Hypothyroidism, unspecified: Secondary | ICD-10-CM | POA: Diagnosis not present

## 2017-03-20 DIAGNOSIS — E782 Mixed hyperlipidemia: Secondary | ICD-10-CM | POA: Diagnosis not present

## 2017-03-20 DIAGNOSIS — E1165 Type 2 diabetes mellitus with hyperglycemia: Secondary | ICD-10-CM | POA: Diagnosis not present

## 2017-03-20 DIAGNOSIS — I482 Chronic atrial fibrillation: Secondary | ICD-10-CM | POA: Diagnosis not present

## 2017-03-20 DIAGNOSIS — G4733 Obstructive sleep apnea (adult) (pediatric): Secondary | ICD-10-CM | POA: Diagnosis not present

## 2017-03-30 DIAGNOSIS — Z13820 Encounter for screening for osteoporosis: Secondary | ICD-10-CM | POA: Diagnosis not present

## 2017-03-30 DIAGNOSIS — E2839 Other primary ovarian failure: Secondary | ICD-10-CM | POA: Diagnosis not present

## 2017-05-25 DIAGNOSIS — L089 Local infection of the skin and subcutaneous tissue, unspecified: Secondary | ICD-10-CM | POA: Diagnosis not present

## 2017-05-25 DIAGNOSIS — J069 Acute upper respiratory infection, unspecified: Secondary | ICD-10-CM | POA: Diagnosis not present

## 2017-05-25 DIAGNOSIS — S50862A Insect bite (nonvenomous) of left forearm, initial encounter: Secondary | ICD-10-CM | POA: Diagnosis not present

## 2017-05-25 DIAGNOSIS — S50861A Insect bite (nonvenomous) of right forearm, initial encounter: Secondary | ICD-10-CM | POA: Diagnosis not present

## 2017-05-25 DIAGNOSIS — R51 Headache: Secondary | ICD-10-CM | POA: Diagnosis not present

## 2017-05-25 DIAGNOSIS — W57XXXA Bitten or stung by nonvenomous insect and other nonvenomous arthropods, initial encounter: Secondary | ICD-10-CM | POA: Diagnosis not present

## 2017-07-19 DIAGNOSIS — E039 Hypothyroidism, unspecified: Secondary | ICD-10-CM | POA: Diagnosis not present

## 2017-07-19 DIAGNOSIS — E1165 Type 2 diabetes mellitus with hyperglycemia: Secondary | ICD-10-CM | POA: Diagnosis not present

## 2017-07-19 DIAGNOSIS — R635 Abnormal weight gain: Secondary | ICD-10-CM | POA: Diagnosis not present

## 2017-07-19 DIAGNOSIS — Z6841 Body Mass Index (BMI) 40.0 and over, adult: Secondary | ICD-10-CM | POA: Diagnosis not present

## 2017-07-19 DIAGNOSIS — I10 Essential (primary) hypertension: Secondary | ICD-10-CM | POA: Diagnosis not present

## 2017-07-19 DIAGNOSIS — Z794 Long term (current) use of insulin: Secondary | ICD-10-CM | POA: Diagnosis not present

## 2017-10-11 DIAGNOSIS — Z794 Long term (current) use of insulin: Secondary | ICD-10-CM | POA: Diagnosis not present

## 2017-10-11 DIAGNOSIS — G4733 Obstructive sleep apnea (adult) (pediatric): Secondary | ICD-10-CM | POA: Diagnosis not present

## 2017-10-11 DIAGNOSIS — E782 Mixed hyperlipidemia: Secondary | ICD-10-CM | POA: Diagnosis not present

## 2017-10-11 DIAGNOSIS — E1165 Type 2 diabetes mellitus with hyperglycemia: Secondary | ICD-10-CM | POA: Diagnosis not present

## 2017-10-11 DIAGNOSIS — I10 Essential (primary) hypertension: Secondary | ICD-10-CM | POA: Diagnosis not present

## 2017-10-11 DIAGNOSIS — I482 Chronic atrial fibrillation: Secondary | ICD-10-CM | POA: Diagnosis not present

## 2017-10-11 DIAGNOSIS — R0602 Shortness of breath: Secondary | ICD-10-CM | POA: Diagnosis not present

## 2017-10-11 DIAGNOSIS — R635 Abnormal weight gain: Secondary | ICD-10-CM | POA: Diagnosis not present

## 2017-10-11 DIAGNOSIS — Z23 Encounter for immunization: Secondary | ICD-10-CM | POA: Diagnosis not present

## 2017-10-11 DIAGNOSIS — E039 Hypothyroidism, unspecified: Secondary | ICD-10-CM | POA: Diagnosis not present

## 2017-10-25 DIAGNOSIS — Z1231 Encounter for screening mammogram for malignant neoplasm of breast: Secondary | ICD-10-CM | POA: Diagnosis not present

## 2017-10-29 DIAGNOSIS — Z794 Long term (current) use of insulin: Secondary | ICD-10-CM | POA: Diagnosis not present

## 2017-10-29 DIAGNOSIS — I482 Chronic atrial fibrillation: Secondary | ICD-10-CM | POA: Diagnosis not present

## 2017-10-29 DIAGNOSIS — E1165 Type 2 diabetes mellitus with hyperglycemia: Secondary | ICD-10-CM | POA: Diagnosis not present

## 2017-10-29 DIAGNOSIS — E782 Mixed hyperlipidemia: Secondary | ICD-10-CM | POA: Diagnosis not present

## 2017-12-12 DIAGNOSIS — Z6841 Body Mass Index (BMI) 40.0 and over, adult: Secondary | ICD-10-CM | POA: Diagnosis not present

## 2017-12-13 DIAGNOSIS — I10 Essential (primary) hypertension: Secondary | ICD-10-CM | POA: Diagnosis not present

## 2017-12-13 DIAGNOSIS — E782 Mixed hyperlipidemia: Secondary | ICD-10-CM | POA: Diagnosis not present

## 2017-12-13 DIAGNOSIS — Z789 Other specified health status: Secondary | ICD-10-CM | POA: Diagnosis not present

## 2017-12-13 DIAGNOSIS — I482 Chronic atrial fibrillation: Secondary | ICD-10-CM | POA: Diagnosis not present

## 2017-12-18 DIAGNOSIS — E119 Type 2 diabetes mellitus without complications: Secondary | ICD-10-CM | POA: Diagnosis not present

## 2017-12-18 DIAGNOSIS — Z794 Long term (current) use of insulin: Secondary | ICD-10-CM | POA: Diagnosis not present

## 2017-12-18 DIAGNOSIS — E063 Autoimmune thyroiditis: Secondary | ICD-10-CM | POA: Diagnosis not present

## 2017-12-18 DIAGNOSIS — Z9989 Dependence on other enabling machines and devices: Secondary | ICD-10-CM | POA: Diagnosis not present

## 2017-12-18 DIAGNOSIS — I10 Essential (primary) hypertension: Secondary | ICD-10-CM | POA: Diagnosis not present

## 2017-12-18 DIAGNOSIS — Z6841 Body Mass Index (BMI) 40.0 and over, adult: Secondary | ICD-10-CM | POA: Diagnosis not present

## 2017-12-18 DIAGNOSIS — E782 Mixed hyperlipidemia: Secondary | ICD-10-CM | POA: Diagnosis not present

## 2017-12-18 DIAGNOSIS — E038 Other specified hypothyroidism: Secondary | ICD-10-CM | POA: Diagnosis not present

## 2017-12-18 DIAGNOSIS — M1712 Unilateral primary osteoarthritis, left knee: Secondary | ICD-10-CM | POA: Diagnosis not present

## 2017-12-18 DIAGNOSIS — G4733 Obstructive sleep apnea (adult) (pediatric): Secondary | ICD-10-CM | POA: Diagnosis not present

## 2017-12-18 DIAGNOSIS — I482 Chronic atrial fibrillation: Secondary | ICD-10-CM | POA: Diagnosis not present

## 2018-01-08 DIAGNOSIS — Z794 Long term (current) use of insulin: Secondary | ICD-10-CM | POA: Diagnosis not present

## 2018-01-08 DIAGNOSIS — Z6841 Body Mass Index (BMI) 40.0 and over, adult: Secondary | ICD-10-CM | POA: Diagnosis not present

## 2018-01-08 DIAGNOSIS — E119 Type 2 diabetes mellitus without complications: Secondary | ICD-10-CM | POA: Diagnosis not present

## 2018-01-08 DIAGNOSIS — I10 Essential (primary) hypertension: Secondary | ICD-10-CM | POA: Diagnosis not present

## 2018-01-10 DIAGNOSIS — L853 Xerosis cutis: Secondary | ICD-10-CM | POA: Diagnosis not present

## 2018-01-10 DIAGNOSIS — D225 Melanocytic nevi of trunk: Secondary | ICD-10-CM | POA: Diagnosis not present

## 2018-01-10 DIAGNOSIS — L814 Other melanin hyperpigmentation: Secondary | ICD-10-CM | POA: Diagnosis not present

## 2018-01-10 DIAGNOSIS — L821 Other seborrheic keratosis: Secondary | ICD-10-CM | POA: Diagnosis not present

## 2018-01-10 DIAGNOSIS — Z85820 Personal history of malignant melanoma of skin: Secondary | ICD-10-CM | POA: Diagnosis not present

## 2018-01-14 DIAGNOSIS — E119 Type 2 diabetes mellitus without complications: Secondary | ICD-10-CM | POA: Diagnosis not present

## 2018-01-14 DIAGNOSIS — Z6841 Body Mass Index (BMI) 40.0 and over, adult: Secondary | ICD-10-CM | POA: Diagnosis not present

## 2018-01-14 DIAGNOSIS — Z713 Dietary counseling and surveillance: Secondary | ICD-10-CM | POA: Diagnosis not present

## 2018-01-28 DIAGNOSIS — Z794 Long term (current) use of insulin: Secondary | ICD-10-CM | POA: Diagnosis not present

## 2018-01-28 DIAGNOSIS — E119 Type 2 diabetes mellitus without complications: Secondary | ICD-10-CM | POA: Diagnosis not present

## 2018-01-28 DIAGNOSIS — I10 Essential (primary) hypertension: Secondary | ICD-10-CM | POA: Diagnosis not present

## 2018-01-28 DIAGNOSIS — Z6841 Body Mass Index (BMI) 40.0 and over, adult: Secondary | ICD-10-CM | POA: Diagnosis not present

## 2018-02-11 DIAGNOSIS — Z713 Dietary counseling and surveillance: Secondary | ICD-10-CM | POA: Diagnosis not present

## 2018-02-11 DIAGNOSIS — Z6841 Body Mass Index (BMI) 40.0 and over, adult: Secondary | ICD-10-CM | POA: Diagnosis not present

## 2018-02-11 DIAGNOSIS — E119 Type 2 diabetes mellitus without complications: Secondary | ICD-10-CM | POA: Diagnosis not present

## 2018-02-21 DIAGNOSIS — E039 Hypothyroidism, unspecified: Secondary | ICD-10-CM | POA: Diagnosis not present

## 2018-02-21 DIAGNOSIS — E1165 Type 2 diabetes mellitus with hyperglycemia: Secondary | ICD-10-CM | POA: Diagnosis not present

## 2018-02-21 DIAGNOSIS — I482 Chronic atrial fibrillation: Secondary | ICD-10-CM | POA: Diagnosis not present

## 2018-02-21 DIAGNOSIS — Z91030 Bee allergy status: Secondary | ICD-10-CM | POA: Diagnosis not present

## 2018-02-21 DIAGNOSIS — E782 Mixed hyperlipidemia: Secondary | ICD-10-CM | POA: Diagnosis not present

## 2018-02-21 DIAGNOSIS — Z1211 Encounter for screening for malignant neoplasm of colon: Secondary | ICD-10-CM | POA: Diagnosis not present

## 2018-02-21 DIAGNOSIS — I10 Essential (primary) hypertension: Secondary | ICD-10-CM | POA: Diagnosis not present

## 2018-02-21 DIAGNOSIS — Z794 Long term (current) use of insulin: Secondary | ICD-10-CM | POA: Diagnosis not present

## 2018-03-11 DIAGNOSIS — I10 Essential (primary) hypertension: Secondary | ICD-10-CM | POA: Diagnosis not present

## 2018-03-11 DIAGNOSIS — Z794 Long term (current) use of insulin: Secondary | ICD-10-CM | POA: Diagnosis not present

## 2018-03-11 DIAGNOSIS — Z6841 Body Mass Index (BMI) 40.0 and over, adult: Secondary | ICD-10-CM | POA: Diagnosis not present

## 2018-03-11 DIAGNOSIS — E119 Type 2 diabetes mellitus without complications: Secondary | ICD-10-CM | POA: Diagnosis not present

## 2018-03-31 IMAGING — CT CT ABD-PELV W/ CM
2 of 5 series · 16 of 46 positions shown, 18 images · IV contrast (iopamidol)
Comparison: Report of CT Abdomen and Pelvis 09/26/2000 (no images
available).

[HOSPITAL] lumbar MRI 12/08/2008

CLINICAL DATA: 65-year-old female with right side abdominal pain
and bloating for 7 months. Initial encounter. Personal history of
colon cancer treated with surgery and chemotherapy.

EXAM:
CT ABDOMEN AND PELVIS WITH CONTRAST
TECHNIQUE: Multidetector CT imaging of the abdomen and pelvis was performed
using the standard protocol following bolus administration of
intravenous contrast.
CONTRAST:  100mL ZTP88D-VOO IOPAMIDOL (ZTP88D-VOO) INJECTION 61%

[Series 2: abd/pelvis w/cm · axial · 0.83mm/px · z∈[-371,+59]mm · 13 of 98 slices shown, 15 images]
[im 6/98  soft-tissue]
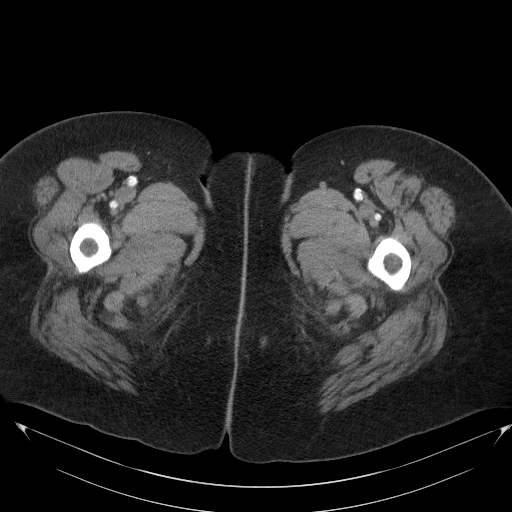
[im 6/98  bone]
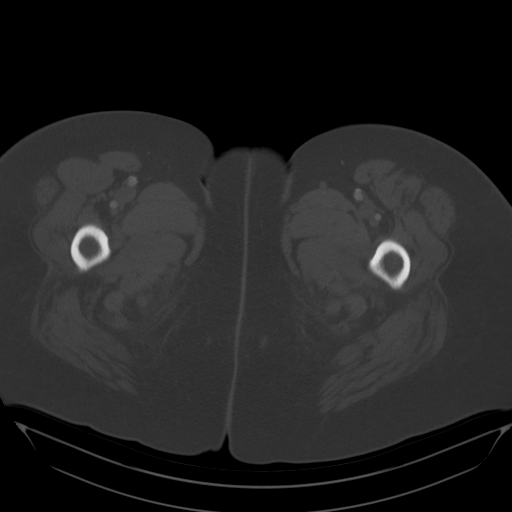
[im 11/98  soft-tissue]
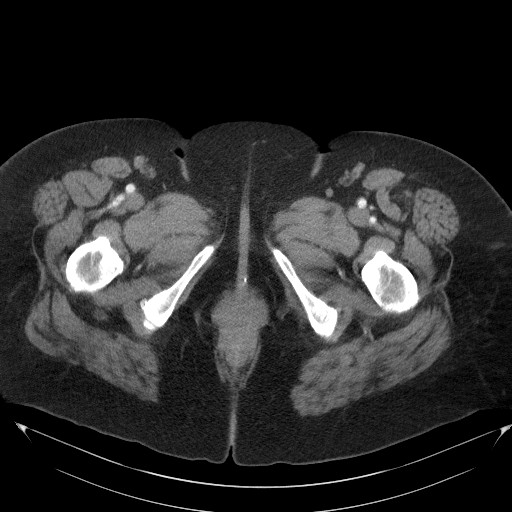
[im 22/98  soft-tissue]
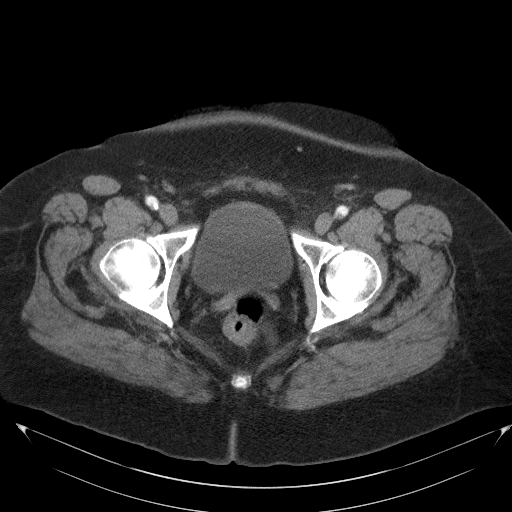
[im 27/98  soft-tissue]
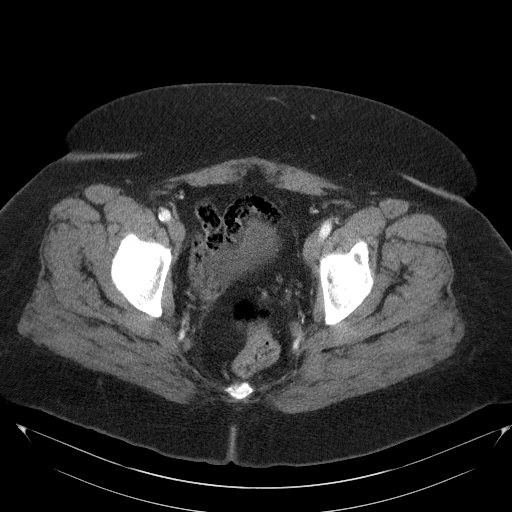
[im 33/98  soft-tissue]
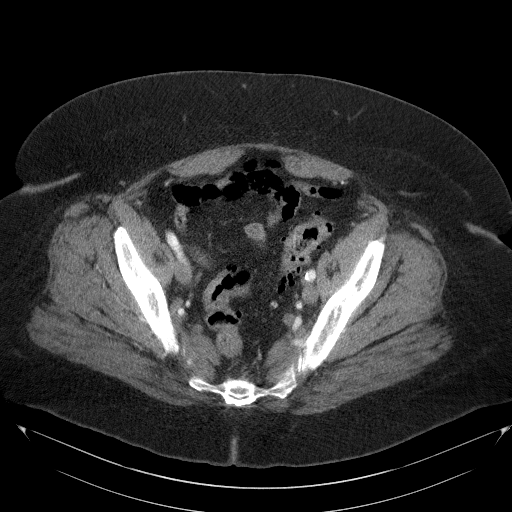
[im 44/98  soft-tissue]
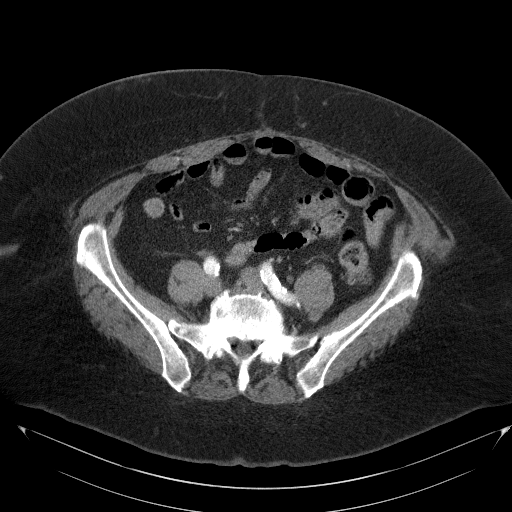
[im 49/98  soft-tissue]
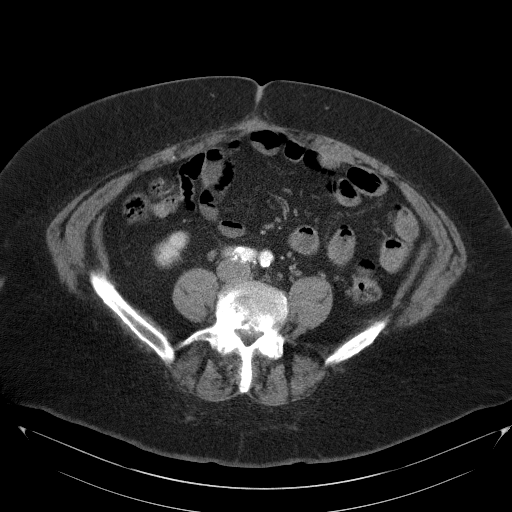
[im 54/98  soft-tissue]
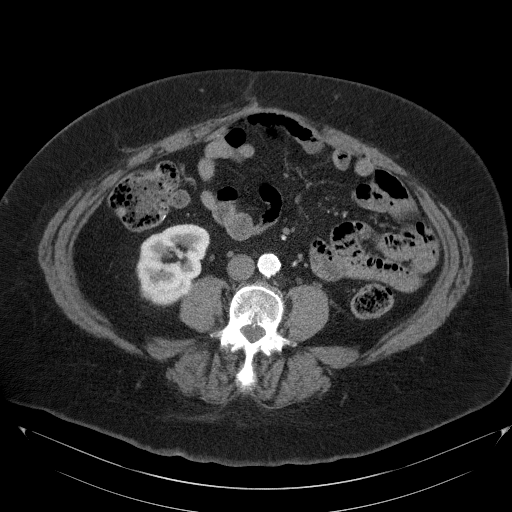
[im 65/98  soft-tissue]
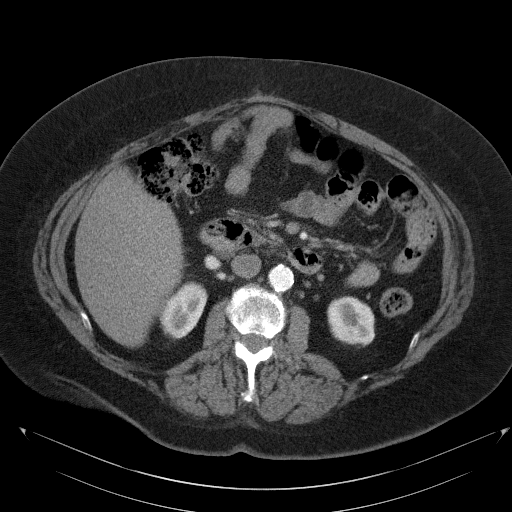
[im 65/98  bone]
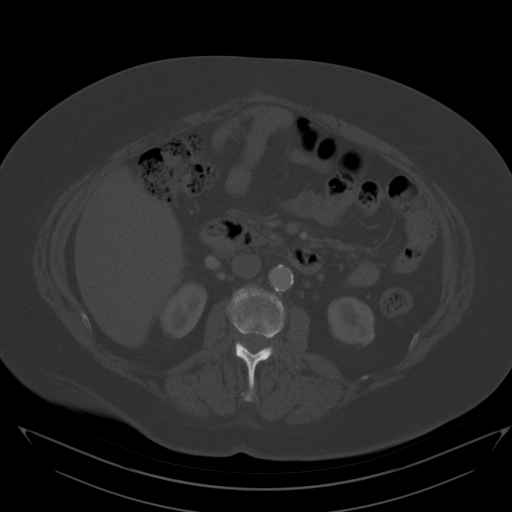
[im 71/98  soft-tissue]
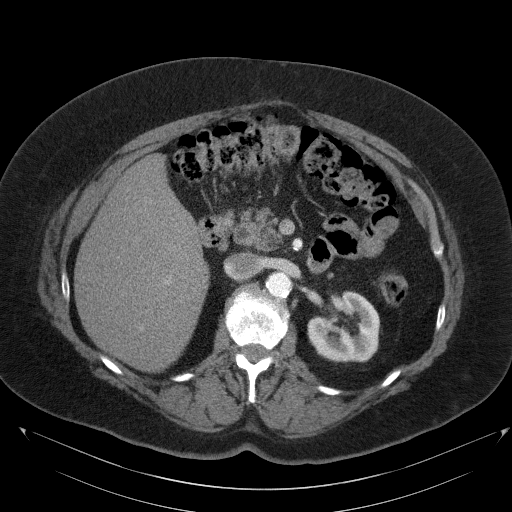
[im 76/98  soft-tissue]
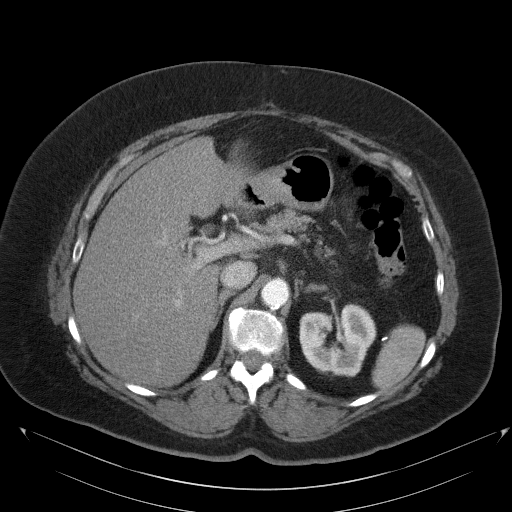
[im 87/98  soft-tissue]
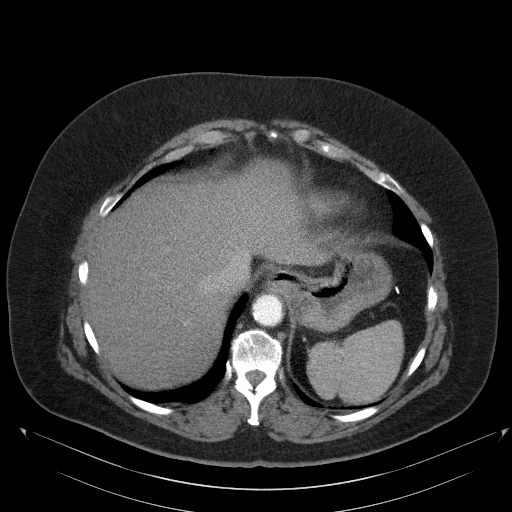
[im 92/98  soft-tissue]
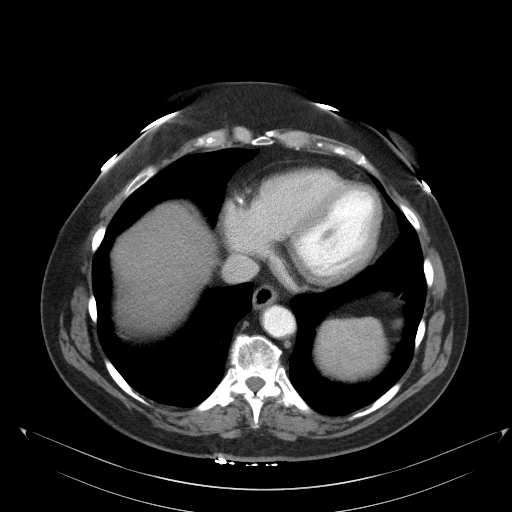

[Series 3: cor · coronal · 0.83mm/px · 3 of 107 slices shown]
[im 36/107  soft-tissue]
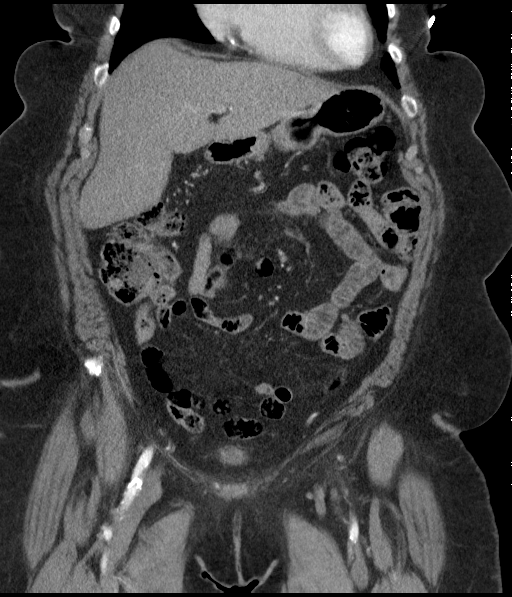
[im 48/107  soft-tissue]
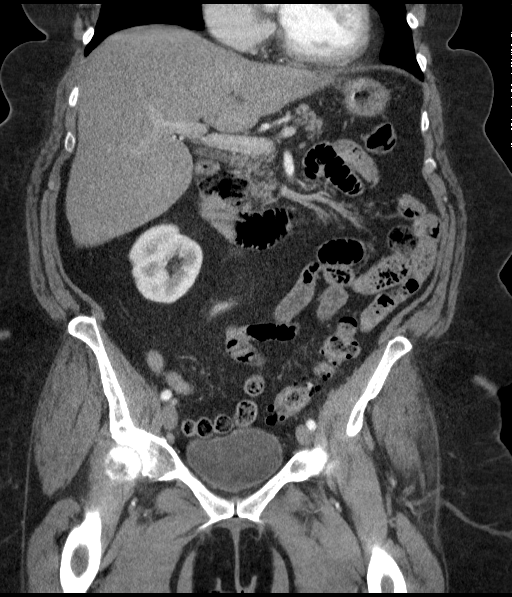
[im 59/107  soft-tissue]
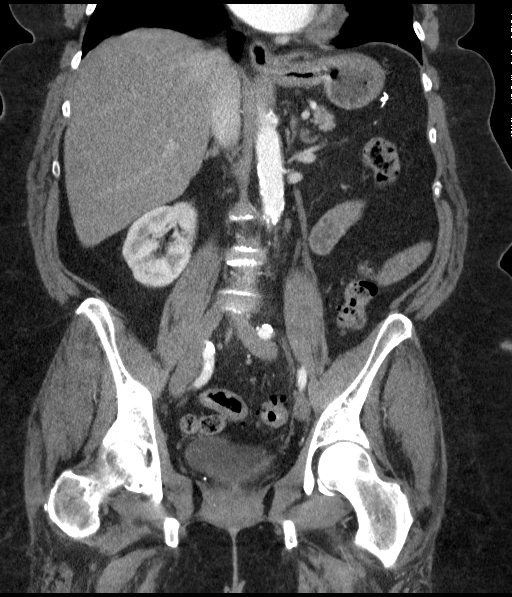

[16 of 46 positions shown; findings below may reference images not displayed]

FINDINGS: Mild cardiomegaly. No pericardial or pleural effusion. Negative lung
bases.

Stable vertebral height and alignment. Degenerative changes
throughout the spine. No acute or suspicious osseous lesion
identified.

Surgically absent uterus. Ovaries diminutive or absent. No pelvic
free fluid. Unremarkable urinary bladder. Negative rectum.

Moderate to severe sigmoid diverticulosis. No active inflammation
identified. Diverticulosis continues into the distal descending
colon, no active inflammation. Negative more proximal left colon and
splenic flexure. Negative transverse colon aside from retained
stool. Negative right colon. Evidence of prior appendectomy.
Negative terminal ileum. No dilated small bowel. Negative stomach.
Negative duodenum aside from a a 4.3 cm duodenum diverticulum.

No abdominal free air or free fluid. Surgically absent gallbladder.
Decreased density throughout the liver compatible with steatosis. No
discrete liver lesion is evident. Negative spleen. There are
multiple surgical clips in the gastrosplenic ligament and anterior
to the spleen which might be related to previous colon cancer
therapy in this setting. Negative pancreas and adrenal glands.

Aortoiliac calcified atherosclerosis noted. Major arterial
structures are patent. Portal venous system appears patent on
delayed images. Bilateral renal enhancement and contrast excretion
within normal limits. No lymphadenopathy. No abdominal hernia
identified.
IMPRESSION: 1. No acute com inflammatory, or metastatic process identified in
the abdomen or pelvis.
2. Four cm duodenum diverticulum. Often duodenum diverticula are
incidental, although they can be associated with abdominal symptoms
secondary to bacterial overgrowth.
3. Diverticulosis of the distal colon with no active inflammation
identified.
4. Hepatic steatosis.
5.  Calcified aortic atherosclerosis.

## 2018-04-01 DIAGNOSIS — Z713 Dietary counseling and surveillance: Secondary | ICD-10-CM | POA: Diagnosis not present

## 2018-04-01 DIAGNOSIS — Z6841 Body Mass Index (BMI) 40.0 and over, adult: Secondary | ICD-10-CM | POA: Diagnosis not present

## 2018-04-01 DIAGNOSIS — E119 Type 2 diabetes mellitus without complications: Secondary | ICD-10-CM | POA: Diagnosis not present

## 2018-04-26 DIAGNOSIS — J069 Acute upper respiratory infection, unspecified: Secondary | ICD-10-CM | POA: Diagnosis not present

## 2018-06-24 DIAGNOSIS — Z789 Other specified health status: Secondary | ICD-10-CM | POA: Diagnosis not present

## 2018-06-24 DIAGNOSIS — R0609 Other forms of dyspnea: Secondary | ICD-10-CM | POA: Diagnosis not present

## 2018-06-24 DIAGNOSIS — I10 Essential (primary) hypertension: Secondary | ICD-10-CM | POA: Diagnosis not present

## 2018-06-24 DIAGNOSIS — I482 Chronic atrial fibrillation: Secondary | ICD-10-CM | POA: Diagnosis not present

## 2018-06-26 DIAGNOSIS — Z789 Other specified health status: Secondary | ICD-10-CM | POA: Diagnosis not present

## 2018-06-26 DIAGNOSIS — R0609 Other forms of dyspnea: Secondary | ICD-10-CM | POA: Diagnosis not present

## 2018-06-26 DIAGNOSIS — Z0000 Encounter for general adult medical examination without abnormal findings: Secondary | ICD-10-CM | POA: Diagnosis not present

## 2018-06-26 DIAGNOSIS — Z6841 Body Mass Index (BMI) 40.0 and over, adult: Secondary | ICD-10-CM | POA: Diagnosis not present

## 2018-06-26 DIAGNOSIS — R5383 Other fatigue: Secondary | ICD-10-CM | POA: Diagnosis not present

## 2018-06-26 DIAGNOSIS — I10 Essential (primary) hypertension: Secondary | ICD-10-CM | POA: Diagnosis not present

## 2018-06-26 DIAGNOSIS — Z780 Asymptomatic menopausal state: Secondary | ICD-10-CM | POA: Diagnosis not present

## 2018-06-26 DIAGNOSIS — E1165 Type 2 diabetes mellitus with hyperglycemia: Secondary | ICD-10-CM | POA: Diagnosis not present

## 2018-06-26 DIAGNOSIS — E782 Mixed hyperlipidemia: Secondary | ICD-10-CM | POA: Diagnosis not present

## 2018-06-26 DIAGNOSIS — F411 Generalized anxiety disorder: Secondary | ICD-10-CM | POA: Diagnosis not present

## 2018-06-26 DIAGNOSIS — I482 Chronic atrial fibrillation: Secondary | ICD-10-CM | POA: Diagnosis not present

## 2018-06-26 DIAGNOSIS — E039 Hypothyroidism, unspecified: Secondary | ICD-10-CM | POA: Diagnosis not present

## 2018-07-30 DIAGNOSIS — I272 Pulmonary hypertension, unspecified: Secondary | ICD-10-CM | POA: Diagnosis not present

## 2018-07-30 DIAGNOSIS — R0602 Shortness of breath: Secondary | ICD-10-CM | POA: Diagnosis not present

## 2018-08-14 DIAGNOSIS — R0609 Other forms of dyspnea: Secondary | ICD-10-CM | POA: Diagnosis not present

## 2018-08-14 DIAGNOSIS — I10 Essential (primary) hypertension: Secondary | ICD-10-CM | POA: Diagnosis not present

## 2018-08-14 DIAGNOSIS — Z0189 Encounter for other specified special examinations: Secondary | ICD-10-CM | POA: Diagnosis not present

## 2018-08-14 DIAGNOSIS — I482 Chronic atrial fibrillation: Secondary | ICD-10-CM | POA: Diagnosis not present

## 2018-08-20 DIAGNOSIS — M25561 Pain in right knee: Secondary | ICD-10-CM | POA: Diagnosis not present

## 2018-08-20 DIAGNOSIS — M25562 Pain in left knee: Secondary | ICD-10-CM | POA: Diagnosis not present

## 2018-09-06 DIAGNOSIS — K5730 Diverticulosis of large intestine without perforation or abscess without bleeding: Secondary | ICD-10-CM | POA: Diagnosis not present

## 2018-09-06 DIAGNOSIS — Z85038 Personal history of other malignant neoplasm of large intestine: Secondary | ICD-10-CM | POA: Diagnosis not present

## 2018-09-06 DIAGNOSIS — D123 Benign neoplasm of transverse colon: Secondary | ICD-10-CM | POA: Diagnosis not present

## 2018-09-06 DIAGNOSIS — C189 Malignant neoplasm of colon, unspecified: Secondary | ICD-10-CM | POA: Diagnosis not present

## 2018-09-06 LAB — HM COLONOSCOPY

## 2018-09-12 DIAGNOSIS — Z23 Encounter for immunization: Secondary | ICD-10-CM | POA: Diagnosis not present

## 2018-09-26 DIAGNOSIS — H2513 Age-related nuclear cataract, bilateral: Secondary | ICD-10-CM | POA: Diagnosis not present

## 2018-09-26 DIAGNOSIS — Z794 Long term (current) use of insulin: Secondary | ICD-10-CM | POA: Diagnosis not present

## 2018-09-26 DIAGNOSIS — H43811 Vitreous degeneration, right eye: Secondary | ICD-10-CM | POA: Diagnosis not present

## 2018-09-26 DIAGNOSIS — E119 Type 2 diabetes mellitus without complications: Secondary | ICD-10-CM | POA: Diagnosis not present

## 2018-10-31 DIAGNOSIS — M25512 Pain in left shoulder: Secondary | ICD-10-CM | POA: Diagnosis not present

## 2018-10-31 DIAGNOSIS — M25511 Pain in right shoulder: Secondary | ICD-10-CM | POA: Diagnosis not present

## 2018-11-13 DIAGNOSIS — N763 Subacute and chronic vulvitis: Secondary | ICD-10-CM | POA: Insufficient documentation

## 2018-11-13 DIAGNOSIS — E119 Type 2 diabetes mellitus without complications: Secondary | ICD-10-CM | POA: Diagnosis not present

## 2018-11-13 DIAGNOSIS — Z01411 Encounter for gynecological examination (general) (routine) with abnormal findings: Secondary | ICD-10-CM | POA: Diagnosis not present

## 2018-11-13 DIAGNOSIS — N898 Other specified noninflammatory disorders of vagina: Secondary | ICD-10-CM | POA: Diagnosis not present

## 2018-11-13 DIAGNOSIS — I482 Chronic atrial fibrillation, unspecified: Secondary | ICD-10-CM | POA: Diagnosis not present

## 2018-11-13 DIAGNOSIS — Z794 Long term (current) use of insulin: Secondary | ICD-10-CM | POA: Diagnosis not present

## 2018-11-13 DIAGNOSIS — B372 Candidiasis of skin and nail: Secondary | ICD-10-CM | POA: Diagnosis not present

## 2018-11-18 DIAGNOSIS — Z1231 Encounter for screening mammogram for malignant neoplasm of breast: Secondary | ICD-10-CM | POA: Diagnosis not present

## 2018-12-13 DIAGNOSIS — M25511 Pain in right shoulder: Secondary | ICD-10-CM | POA: Diagnosis not present

## 2018-12-13 DIAGNOSIS — M542 Cervicalgia: Secondary | ICD-10-CM | POA: Diagnosis not present

## 2018-12-20 DIAGNOSIS — M75121 Complete rotator cuff tear or rupture of right shoulder, not specified as traumatic: Secondary | ICD-10-CM | POA: Diagnosis not present

## 2018-12-20 DIAGNOSIS — M19011 Primary osteoarthritis, right shoulder: Secondary | ICD-10-CM | POA: Diagnosis not present

## 2018-12-20 DIAGNOSIS — M25511 Pain in right shoulder: Secondary | ICD-10-CM | POA: Diagnosis not present

## 2018-12-25 DIAGNOSIS — Z794 Long term (current) use of insulin: Secondary | ICD-10-CM | POA: Diagnosis not present

## 2018-12-25 DIAGNOSIS — E119 Type 2 diabetes mellitus without complications: Secondary | ICD-10-CM | POA: Diagnosis not present

## 2018-12-25 DIAGNOSIS — N763 Subacute and chronic vulvitis: Secondary | ICD-10-CM | POA: Diagnosis not present

## 2018-12-31 DIAGNOSIS — E1165 Type 2 diabetes mellitus with hyperglycemia: Secondary | ICD-10-CM | POA: Diagnosis not present

## 2018-12-31 DIAGNOSIS — E782 Mixed hyperlipidemia: Secondary | ICD-10-CM | POA: Diagnosis not present

## 2018-12-31 DIAGNOSIS — I4811 Longstanding persistent atrial fibrillation: Secondary | ICD-10-CM | POA: Diagnosis not present

## 2018-12-31 DIAGNOSIS — Z6841 Body Mass Index (BMI) 40.0 and over, adult: Secondary | ICD-10-CM | POA: Diagnosis not present

## 2018-12-31 DIAGNOSIS — E039 Hypothyroidism, unspecified: Secondary | ICD-10-CM | POA: Diagnosis not present

## 2018-12-31 DIAGNOSIS — I10 Essential (primary) hypertension: Secondary | ICD-10-CM | POA: Diagnosis not present

## 2019-01-02 DIAGNOSIS — M25511 Pain in right shoulder: Secondary | ICD-10-CM | POA: Diagnosis not present

## 2019-02-12 ENCOUNTER — Ambulatory Visit: Payer: Self-pay | Admitting: Cardiology

## 2019-04-02 DIAGNOSIS — R300 Dysuria: Secondary | ICD-10-CM | POA: Diagnosis not present

## 2019-04-02 DIAGNOSIS — E1165 Type 2 diabetes mellitus with hyperglycemia: Secondary | ICD-10-CM | POA: Diagnosis not present

## 2019-04-02 DIAGNOSIS — E039 Hypothyroidism, unspecified: Secondary | ICD-10-CM | POA: Diagnosis not present

## 2019-04-02 LAB — BASIC METABOLIC PANEL
BUN: 34 — AB (ref 4–21)
Creatinine: 1.1 (ref 0.5–1.1)

## 2019-04-02 LAB — TSH: TSH: 4.07 (ref 0.41–5.90)

## 2019-04-02 LAB — LIPID PANEL: LDL Cholesterol: 160

## 2019-04-10 DIAGNOSIS — N182 Chronic kidney disease, stage 2 (mild): Secondary | ICD-10-CM | POA: Diagnosis not present

## 2019-04-10 DIAGNOSIS — G4700 Insomnia, unspecified: Secondary | ICD-10-CM | POA: Diagnosis not present

## 2019-04-10 DIAGNOSIS — E782 Mixed hyperlipidemia: Secondary | ICD-10-CM | POA: Diagnosis not present

## 2019-04-10 DIAGNOSIS — E039 Hypothyroidism, unspecified: Secondary | ICD-10-CM | POA: Diagnosis not present

## 2019-04-10 DIAGNOSIS — E1165 Type 2 diabetes mellitus with hyperglycemia: Secondary | ICD-10-CM | POA: Diagnosis not present

## 2019-04-10 DIAGNOSIS — I10 Essential (primary) hypertension: Secondary | ICD-10-CM | POA: Diagnosis not present

## 2019-04-10 DIAGNOSIS — Z6841 Body Mass Index (BMI) 40.0 and over, adult: Secondary | ICD-10-CM | POA: Diagnosis not present

## 2019-05-06 DIAGNOSIS — H18832 Recurrent erosion of cornea, left eye: Secondary | ICD-10-CM | POA: Diagnosis not present

## 2019-06-27 DIAGNOSIS — I10 Essential (primary) hypertension: Secondary | ICD-10-CM | POA: Diagnosis not present

## 2019-06-27 DIAGNOSIS — E1165 Type 2 diabetes mellitus with hyperglycemia: Secondary | ICD-10-CM | POA: Diagnosis not present

## 2019-06-27 LAB — BASIC METABOLIC PANEL
BUN: 37 — AB (ref 4–21)
Creatinine: 1.1 (ref 0.5–1.1)

## 2019-06-27 LAB — HEMOGLOBIN A1C: Hemoglobin A1C: 8.2

## 2019-07-01 DIAGNOSIS — E782 Mixed hyperlipidemia: Secondary | ICD-10-CM | POA: Diagnosis not present

## 2019-07-01 DIAGNOSIS — G4709 Other insomnia: Secondary | ICD-10-CM | POA: Diagnosis not present

## 2019-07-01 DIAGNOSIS — I10 Essential (primary) hypertension: Secondary | ICD-10-CM | POA: Diagnosis not present

## 2019-07-01 DIAGNOSIS — E1165 Type 2 diabetes mellitus with hyperglycemia: Secondary | ICD-10-CM | POA: Diagnosis not present

## 2019-08-04 DIAGNOSIS — Z23 Encounter for immunization: Secondary | ICD-10-CM | POA: Diagnosis not present

## 2019-08-04 DIAGNOSIS — S0101XA Laceration without foreign body of scalp, initial encounter: Secondary | ICD-10-CM | POA: Diagnosis not present

## 2019-08-04 DIAGNOSIS — S0990XA Unspecified injury of head, initial encounter: Secondary | ICD-10-CM | POA: Diagnosis not present

## 2019-08-04 DIAGNOSIS — Z888 Allergy status to other drugs, medicaments and biological substances status: Secondary | ICD-10-CM | POA: Diagnosis not present

## 2019-08-04 DIAGNOSIS — Z794 Long term (current) use of insulin: Secondary | ICD-10-CM | POA: Diagnosis not present

## 2019-08-04 DIAGNOSIS — S0093XA Contusion of unspecified part of head, initial encounter: Secondary | ICD-10-CM | POA: Diagnosis not present

## 2019-08-04 DIAGNOSIS — S199XXA Unspecified injury of neck, initial encounter: Secondary | ICD-10-CM | POA: Diagnosis not present

## 2019-08-04 DIAGNOSIS — Z880 Allergy status to penicillin: Secondary | ICD-10-CM | POA: Diagnosis not present

## 2019-08-04 DIAGNOSIS — Z79899 Other long term (current) drug therapy: Secondary | ICD-10-CM | POA: Diagnosis not present

## 2019-08-04 DIAGNOSIS — Z87891 Personal history of nicotine dependence: Secondary | ICD-10-CM | POA: Diagnosis not present

## 2019-08-04 DIAGNOSIS — S0191XA Laceration without foreign body of unspecified part of head, initial encounter: Secondary | ICD-10-CM

## 2019-08-04 DIAGNOSIS — G8911 Acute pain due to trauma: Secondary | ICD-10-CM | POA: Diagnosis not present

## 2019-08-04 DIAGNOSIS — S098XXA Other specified injuries of head, initial encounter: Secondary | ICD-10-CM | POA: Diagnosis not present

## 2019-08-04 DIAGNOSIS — S0181XA Laceration without foreign body of other part of head, initial encounter: Secondary | ICD-10-CM | POA: Diagnosis not present

## 2019-08-04 HISTORY — DX: Laceration without foreign body of unspecified part of head, initial encounter: S01.91XA

## 2019-08-12 DIAGNOSIS — Z4802 Encounter for removal of sutures: Secondary | ICD-10-CM | POA: Diagnosis not present

## 2019-08-12 DIAGNOSIS — S0101XD Laceration without foreign body of scalp, subsequent encounter: Secondary | ICD-10-CM | POA: Diagnosis not present

## 2019-09-26 DIAGNOSIS — E1165 Type 2 diabetes mellitus with hyperglycemia: Secondary | ICD-10-CM | POA: Diagnosis not present

## 2019-09-26 DIAGNOSIS — Z23 Encounter for immunization: Secondary | ICD-10-CM | POA: Diagnosis not present

## 2019-09-26 LAB — HEMOGLOBIN A1C: Hemoglobin A1C: 8

## 2019-10-21 DIAGNOSIS — Z23 Encounter for immunization: Secondary | ICD-10-CM | POA: Diagnosis not present

## 2020-04-07 ENCOUNTER — Encounter: Payer: Self-pay | Admitting: Family Medicine

## 2020-04-19 ENCOUNTER — Encounter: Payer: Self-pay | Admitting: Family Medicine

## 2020-04-20 ENCOUNTER — Other Ambulatory Visit: Payer: Self-pay

## 2020-04-20 ENCOUNTER — Encounter: Payer: Self-pay | Admitting: Family Medicine

## 2020-04-20 ENCOUNTER — Ambulatory Visit (INDEPENDENT_AMBULATORY_CARE_PROVIDER_SITE_OTHER): Payer: Medicare Other | Admitting: Family Medicine

## 2020-04-20 VITALS — BP 116/78 | HR 74 | Temp 97.5°F | Resp 18 | Ht 67.0 in | Wt 279.4 lb

## 2020-04-20 DIAGNOSIS — G4730 Sleep apnea, unspecified: Secondary | ICD-10-CM | POA: Insufficient documentation

## 2020-04-20 DIAGNOSIS — I4891 Unspecified atrial fibrillation: Secondary | ICD-10-CM | POA: Insufficient documentation

## 2020-04-20 DIAGNOSIS — F419 Anxiety disorder, unspecified: Secondary | ICD-10-CM | POA: Insufficient documentation

## 2020-04-20 DIAGNOSIS — E782 Mixed hyperlipidemia: Secondary | ICD-10-CM | POA: Insufficient documentation

## 2020-04-20 DIAGNOSIS — E1169 Type 2 diabetes mellitus with other specified complication: Secondary | ICD-10-CM | POA: Insufficient documentation

## 2020-04-20 DIAGNOSIS — E119 Type 2 diabetes mellitus without complications: Secondary | ICD-10-CM | POA: Diagnosis not present

## 2020-04-20 DIAGNOSIS — I4821 Permanent atrial fibrillation: Secondary | ICD-10-CM | POA: Diagnosis not present

## 2020-04-20 DIAGNOSIS — E039 Hypothyroidism, unspecified: Secondary | ICD-10-CM | POA: Insufficient documentation

## 2020-04-20 DIAGNOSIS — E785 Hyperlipidemia, unspecified: Secondary | ICD-10-CM | POA: Insufficient documentation

## 2020-04-20 DIAGNOSIS — I10 Essential (primary) hypertension: Secondary | ICD-10-CM | POA: Insufficient documentation

## 2020-04-20 DIAGNOSIS — Z7689 Persons encountering health services in other specified circumstances: Secondary | ICD-10-CM

## 2020-04-20 DIAGNOSIS — G479 Sleep disorder, unspecified: Secondary | ICD-10-CM | POA: Insufficient documentation

## 2020-04-20 LAB — CBC WITH DIFFERENTIAL/PLATELET
Basophils Absolute: 0.1 10*3/uL (ref 0.0–0.1)
Basophils Relative: 0.9 % (ref 0.0–3.0)
Eosinophils Absolute: 0.2 10*3/uL (ref 0.0–0.7)
Eosinophils Relative: 1.8 % (ref 0.0–5.0)
HCT: 43.8 % (ref 36.0–46.0)
Hemoglobin: 14.8 g/dL (ref 12.0–15.0)
Lymphocytes Relative: 18.7 % (ref 12.0–46.0)
Lymphs Abs: 1.6 10*3/uL (ref 0.7–4.0)
MCHC: 33.9 g/dL (ref 30.0–36.0)
MCV: 90.7 fl (ref 78.0–100.0)
Monocytes Absolute: 0.7 10*3/uL (ref 0.1–1.0)
Monocytes Relative: 8.1 % (ref 3.0–12.0)
Neutro Abs: 6.1 10*3/uL (ref 1.4–7.7)
Neutrophils Relative %: 70.5 % (ref 43.0–77.0)
Platelets: 141 10*3/uL — ABNORMAL LOW (ref 150.0–400.0)
RBC: 4.83 Mil/uL (ref 3.87–5.11)
RDW: 14.1 % (ref 11.5–15.5)
WBC: 8.7 10*3/uL (ref 4.0–10.5)

## 2020-04-20 LAB — POCT GLYCOSYLATED HEMOGLOBIN (HGB A1C)
HbA1c POC (<> result, manual entry): 8.1 % (ref 4.0–5.6)
HbA1c, POC (controlled diabetic range): 8.1 % — AB (ref 0.0–7.0)
HbA1c, POC (prediabetic range): 8.1 % — AB (ref 5.7–6.4)
Hemoglobin A1C: 8.1 % — AB (ref 4.0–5.6)

## 2020-04-20 LAB — COMPREHENSIVE METABOLIC PANEL
ALT: 16 U/L (ref 0–35)
AST: 16 U/L (ref 0–37)
Albumin: 4.1 g/dL (ref 3.5–5.2)
Alkaline Phosphatase: 70 U/L (ref 39–117)
BUN: 30 mg/dL — ABNORMAL HIGH (ref 6–23)
CO2: 25 mEq/L (ref 19–32)
Calcium: 9.2 mg/dL (ref 8.4–10.5)
Chloride: 100 mEq/L (ref 96–112)
Creatinine, Ser: 0.99 mg/dL (ref 0.40–1.20)
GFR: 55.33 mL/min — ABNORMAL LOW (ref >60.00)
Glucose, Bld: 158 mg/dL — ABNORMAL HIGH (ref 70–99)
Potassium: 3.9 mEq/L (ref 3.5–5.1)
Sodium: 134 mEq/L — ABNORMAL LOW (ref 135–145)
Total Bilirubin: 0.8 mg/dL (ref 0.2–1.2)
Total Protein: 6.8 g/dL (ref 6.0–8.3)

## 2020-04-20 LAB — LIPID PANEL
Cholesterol: 253 mg/dL — ABNORMAL HIGH (ref 0–200)
HDL: 39.1 mg/dL (ref >39.00)
NonHDL: 213.84
Total CHOL/HDL Ratio: 6
Triglycerides: 236 mg/dL — ABNORMAL HIGH (ref 0.0–149.0)
VLDL: 47.2 mg/dL — ABNORMAL HIGH (ref 0.0–40.0)

## 2020-04-20 LAB — TSH: TSH: 2.54 u[IU]/mL (ref 0.35–4.50)

## 2020-04-20 LAB — LDL CHOLESTEROL, DIRECT: Direct LDL: 163 mg/dL

## 2020-04-20 MED ORDER — BASAGLAR KWIKPEN 100 UNIT/ML ~~LOC~~ SOPN: 52.0000 [IU] | PEN_INJECTOR | Freq: Two times a day (BID) | SUBCUTANEOUS | 3 refills | Status: DC

## 2020-04-20 MED ORDER — FLUTICASONE PROPIONATE 50 MCG/ACT NA SUSP: 2.0000 | Freq: Every day | NASAL | 6 refills | Status: DC

## 2020-04-20 MED ORDER — LEVOTHYROXINE SODIUM 50 MCG PO TABS: 50.0000 ug | ORAL_TABLET | Freq: Every day | ORAL | 3 refills | Status: DC

## 2020-04-20 MED ORDER — OLMESARTAN MEDOXOMIL-HCTZ 40-25 MG PO TABS: 1.0000 | ORAL_TABLET | Freq: Every day | ORAL | 1 refills | Status: DC

## 2020-04-20 MED ORDER — ALPRAZOLAM 0.25 MG PO TABS: 0.2500 mg | ORAL_TABLET | Freq: Every day | ORAL | 1 refills | Status: DC | PRN

## 2020-04-20 MED ORDER — METOPROLOL TARTRATE 50 MG PO TABS: 50.0000 mg | ORAL_TABLET | Freq: Two times a day (BID) | ORAL | 1 refills | Status: DC

## 2020-04-20 MED ORDER — INSULIN ASPART 100 UNIT/ML ~~LOC~~ SOLN: 20.0000 [IU] | Freq: Three times a day (TID) | SUBCUTANEOUS | 3 refills | Status: DC

## 2020-04-20 MED ORDER — FUROSEMIDE 20 MG PO TABS: 20.0000 mg | ORAL_TABLET | Freq: Every day | ORAL | 1 refills | Status: DC | PRN

## 2020-04-20 MED ORDER — DABIGATRAN ETEXILATE MESYLATE 150 MG PO CAPS: 150.0000 mg | ORAL_CAPSULE | Freq: Two times a day (BID) | ORAL | 3 refills | Status: DC

## 2020-04-20 NOTE — Progress Notes (Signed)
Patient ID: Ann [name], female  DOB: 09/02/1951, 69 y.o.   MRN: [id number] Patient Care Team    Relationship Specialty Notifications Start End  Ma Hillock, DO PCP - General Family Medicine  04/20/20   Adrian Prows, MD Consulting Physician Cardiology  04/20/20   Druscilla Brownie, MD Referring Physician Dermatology  04/20/20   Otelia Sergeant, OD Referring Physician   04/20/20   Luci Bank, MD Referring Physician Orthotics  04/20/20   Sonda Primes, MD Referring Physician Obstetrics and Gynecology  04/20/20   Rosana Berger, MD Referring Physician Gastroenterology  04/20/20     Chief Complaint  Patient presents with  . Establish Care    [facility] [place]. Pts insulin is over $2,000 a month. She has filled out manufactur forms to recieve assistance in paying for it. She has enough insulin for 2 months.     Subjective: Ann [name] is a 69 y.o.  female present for new patient establishment/CMC. All past medical history, surgical history, allergies, family history, immunizations, medications and social history were updated in the electronic medical record today. All recent labs, ED visits and hospitalizations within the last year were reviewed.  Diabetes mellitus without complication ([place]) Pt reports compliance with Basaglar/glargine 52 units twice daily and NovoLog 20-24 units 3 times daily before meals.  Patient does not report worsening numbness, tingling of extremities, /hyperglycemic events or non-healing wounds.  She states she has mild neuropathy in bilateral feet.  Pt reports BG ranges 69 to greater than 200. Prior records report patient's diabetes has not been well controlled since 2017. Patient reports intolerance/allergies:  -Metformin-stomach upset  -Farxiga-dizziness and low BP  -Invokana-low BP  -Victoza-low blood pressure PNA series: Pneumovax 23- 10/21/2019 Flu shot: UTD 2020 (recommneded yearly) BMP reviewed from prior records 06/2019 glucose 94, creatinine  1.06, GFR 51, sodium 139, potassium 3.5, BUN 37, chloride 103, CO2 26, calcium 8.6 Foot exam: Completed today 04/20/2020 Eye exam:  Microalbumin: 04/02/2019 A1c: 8.2> 8.0> 8.2> 8.0> 8.1 today  Essential hypertension/HLD/A. Fib Pt reports compliance with Benicar HCT 40-25 mg daily, metoprolol tartrate 50 mg twice daily, Lasix 20 mg as needed.  She states she rarely needs to use Lasix.  Mainly only after salty meals, if she has leg swelling. Patient does not report chest pain, shortness of breath or lower extremity edema. Pt does not take a daily baby ASA. Pt is reports allergies to all statins, WelChol and elects not to take any medications for her cholesterol.  RF: Hypertension, hyperlipidemia, diabetes, obesity, strong family history of CVA  hypothyroidism Patient reports compliance with levothyroxine 50 mcg daily.  Primary insomnia/anxiety Patient reports she had been on Ambien up until last year.  She states her prior PCP would not refill her Ambien for her.  She states she still does not sleep well, but she will manage since she cannot have the Ambien.  By records it appears she was tried on Klonopin within the last few months.  She did not feel she needed type of medication for sleep, but needed a hypnotic.  She reports she has had a prescription for Xanax that dates back to around 2018.  She uses this when she has increased anxiety or heart racing.  She reports she is used this about 9 times in 3 years.  She would like a refill on Xanax.  She states her prior PCP would not refill Xanax or Ambien, therefore she left the practice.  Depression screen [facility] 2/9 04/20/2020  Decreased Interest 0  Down, Depressed, Hopeless 0  PHQ - 2 Score 0   No flowsheet data found.     No flowsheet data found.  Immunization History  Administered Date(s) Administered  . Influenza-Unspecified 09/26/2019  . Moderna SARS-COVID-2 Vaccination 01/22/2020, 02/20/2020  . Pneumococcal Conjugate-13 10/12/2014  .  Pneumococcal Polysaccharide-23 10/21/2019  . Tdap 08/04/2019    No exam data present  Past Medical History:  Diagnosis Date  . Arthritis    osteoarthritis- knees.  . Atrial fibrillation ([place])    Dr. Marcello Moores status post cardioversion chronic anticoagulation  . Chicken pox   . Colon cancer ([place]) 2001   Radiation, chemo.  Adenocarcinoma of the colon.  Followed by Dr. Derrel Nip  . Complication of anesthesia    sensitive to narcotics"extra sleepy". Versed- "no memory recall for 4 days postop"  . Diabetes mellitus without complication ([facility])    oral and Insulin  . Hepatitis    "sub clinical case of Hepatitis in late 70's""has immunities"  . Hypertension   . Hypothyroidism   . Laceration of head 08/04/2019   8 cm laceration forehead/scalp after tripping when mowing her lawn.  . Mixed hyperlipidemia   . 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  . Ovarian teratoma, right 09/2000   Oophorectomy  . Sleep apnea    unable to tolreate cpap  . Wisdom teeth extracted    Allergies  Allergen Reactions  . Amoxicillin Shortness Of Breath, Swelling and Anaphylaxis    Has patient had a PCN reaction causing immediate rash, facial/tongue/throat swelling, SOB or lightheadedness with hypotension: Yes Has patient had a PCN reaction causing severe rash involving mucus membranes or skin necrosis: No Has patient had a PCN reaction that required hospitalization No Has patient had a PCN reaction occurring within the last 10 years: Yes If all of the above answers are "NO", then may proceed with Cephalosporin use.  Has patient had a PCN reaction causing immediate rash, facial/tongue/throat swelling, SOB or lightheadedness with hypotension: Yes Has patient had a PCN reaction causing severe rash involving mucus membranes or skin necrosis: No Has patient had a PCN reaction that required hospitalization No Has patient had a PCN reaction occurring within the last 10 years: Yes If  all of the above answers are "NO", then may proceed with Cephalosporin use. Has patient had a PCN reaction causing immediate rash, facial/tongue/throat swelling, SOB or lightheadedness with hypotension: Yes Has patient had a PCN reaction causing severe rash involving mucus membranes or skin necrosis: No Has patient had a PCN reaction that required hospitalization No Has patient had a PCN reaction occurring within the last 10 years: Yes If all of the above answers are "NO", then may proceed with Cephalosporin use.   . Codeine Itching    Tolerates hydrocodone  . Livalo [Pitavastatin]   . Statins Other (See Comments)    Muscle aches  . Welchol [Colesevelam]     Gi se   Past Surgical History:  Procedure Laterality Date  . ABDOMINAL HYSTERECTOMY  09/2000    Total hysterectomy and oophorectomy  . ABDOMINAL WOUND DEHISCENCE Bilateral    multiple times / mesh use with multiple abdominal hernia repair  . CHOLECYSTECTOMY  04/1998   '98 -Lapraroscopic  . COLON RESECTION  2001   "colon cancer"  . FOOT SURGERY Bilateral    multiple "hammer toes and bunionectomies"  . KNEE ARTHROSCOPY Left 08/2006   meniscectomy  . TOTAL KNEE ARTHROPLASTY Right 01/24/2016  Procedure: RIGHT TOTAL KNEE ARTHROPLASTY;  Surgeon: Gaynelle Arabian, MD;  Location: [facility];  Service: Orthopedics;  Laterality: Right;   Family History  Problem Relation Age of Onset  . Arthritis Mother   . Colon cancer Mother   . Diabetes Mother   . Atrial fibrillation Mother   . Diabetes Father   . Heart attack Father   . Heart disease Father   . Stroke Father   . Hypertension Maternal Grandmother   . Stroke Maternal Grandmother   . Stroke Maternal Grandfather   . Hypertension Paternal Grandmother   . Stroke Paternal Grandmother   . Diabetes Paternal Grandfather   . Hypertension Paternal Grandfather   . Stroke Paternal Grandfather   . Diabetes Brother   . Brain cancer Brother   . Heart disease Brother   . Hypertension Brother    . Stroke Brother    Social History   Social History Narrative  . Not on file    Allergies as of 04/20/2020      Reactions   Amoxicillin Shortness Of Breath, Swelling, Anaphylaxis   Has patient had a PCN reaction causing immediate rash, facial/tongue/throat swelling, SOB or lightheadedness with hypotension: Yes Has patient had a PCN reaction causing severe rash involving mucus membranes or skin necrosis: No Has patient had a PCN reaction that required hospitalization No Has patient had a PCN reaction occurring within the last 10 years: Yes If all of the above answers are "NO", then may proceed with Cephalosporin use. Has patient had a PCN reaction causing immediate rash, facial/tongue/throat swelling, SOB or lightheadedness with hypotension: Yes Has patient had a PCN reaction causing severe rash involving mucus membranes or skin necrosis: No Has patient had a PCN reaction that required hospitalization No Has patient had a PCN reaction occurring within the last 10 years: Yes If all of the above answers are "NO", then may proceed with Cephalosporin use. Has patient had a PCN reaction causing immediate rash, facial/tongue/throat swelling, SOB or lightheadedness with hypotension: Yes Has patient had a PCN reaction causing severe rash involving mucus membranes or skin necrosis: No Has patient had a PCN reaction that required hospitalization No Has patient had a PCN reaction occurring within the last 10 years: Yes If all of the above answers are "NO", then may proceed with Cephalosporin use.   Codeine Itching   Tolerates hydrocodone   Livalo [pitavastatin]    Statins Other (See Comments)   Muscle aches   Welchol [colesevelam]    Gi se      Medication List       Accurate as of Apr 20, 2020 11:59 PM. If you have any questions, ask your nurse or doctor.        STOP taking these medications   cetirizine 10 MG tablet Commonly known as: ZYRTEC Stopped by: Howard Pouch, DO    clindamycin 300 MG capsule Commonly known as: CLEOCIN Stopped by: Howard Pouch, DO   clonazePAM 0.5 MG tablet Commonly known as: KLONOPIN Stopped by: Howard Pouch, DO   diphenhydrAMINE 25 mg capsule Commonly known as: BENADRYL Stopped by: Howard Pouch, DO   furosemide 20 MG tablet Commonly known as: LASIX Stopped by: Howard Pouch, DO   HYDROcodone-acetaminophen 7.5-325 MG tablet Commonly known as: NORCO Stopped by: Howard Pouch, DO   metFORMIN 500 MG 24 hr tablet Commonly known as: GLUCOPHAGE-XR Stopped by: Howard Pouch, DO   methocarbamol 500 MG tablet Commonly known as: ROBAXIN Stopped by: Howard Pouch, DO   ranitidine 150 MG tablet  Commonly known as: ZANTAC Stopped by: Howard Pouch, DO   traMADol 50 MG tablet Commonly known as: ULTRAM Stopped by: Howard Pouch, DO   zolpidem 10 MG tablet Commonly known as: AMBIEN Stopped by: Howard Pouch, DO     TAKE these medications   ALPRAZolam 0.25 MG tablet Commonly known as: XANAX Take 1 tablet (0.25 mg total) by mouth daily as needed for anxiety. Started by: Howard Pouch, DO   Basaglar KwikPen 100 UNIT/ML Inject 0.52 mLs (52 Units total) into the skin 2 (two) times daily. 52 units in the AM and 52 units in the PM What changed:   when to take this  Another medication with the same name was removed. Continue taking this medication, and follow the directions you see here. Changed by: Howard Pouch, DO   dabigatran 150 MG Caps capsule Commonly known as: PRADAXA Take 1 capsule (150 mg total) by mouth 2 (two) times daily.   EpiPen 2-Pak 0.3 mg/0.3 mL Soaj injection Generic drug: EPINEPHrine Inject 0.3 mg into the skin as needed. Bee stings   fluticasone 50 MCG/ACT nasal spray Commonly known as: FLONASE Place 2 sprays into both nostrils daily. Started by: Howard Pouch, DO   insulin aspart 100 UNIT/ML injection Commonly known as: novoLOG Inject 20-24 Units into the skin 3 (three) times daily before meals.  Sliding scale based on blood sugar. <200 20 units, 200-250 22 units, 250-300 24 units   levothyroxine 50 MCG tablet Commonly known as: SYNTHROID Take 1 tablet (50 mcg total) by mouth daily before breakfast.   metoprolol tartrate 50 MG tablet Commonly known as: LOPRESSOR Take 1 tablet (50 mg total) by mouth 2 (two) times daily.   olmesartan-hydrochlorothiazide 40-25 MG tablet Commonly known as: BENICAR HCT Take 1 tablet by mouth daily.   Vitamin D (Cholecalciferol) 25 MCG (1000 UT) Caps Take 2 capsules by mouth daily.       All past medical history, surgical history, allergies, family history, immunizations andmedications were updated in the EMR today and reviewed under the history and medication portions of their EMR.     ROS: 14 pt review of systems performed and negative (unless mentioned in an HPI)  Objective: BP 116/78 (BP Location: Left Arm, Patient Position: Sitting, Cuff Size: Large)   Pulse 74   Temp (!) 97.5 F (36.4 C) (Temporal)   Resp 18   Ht _0  (1.702 m)   Wt 279 lb 6 oz (126.7 kg)   SpO2 97%   BMI 43.76 kg/m  Gen: Afebrile. No acute distress. Nontoxic in appearance, well-developed, well-nourished, obese female  HENT: AT. [place].  No cough or hoarseness on exam.   Eyes:Pupils Equal Round Reactive to light, Extraocular movements intact,  Conjunctiva without redness, discharge or icterus. CV: regular rate, no murmur, no edema, BLE pulses equal.  Chest: CTAB, no wheeze, rhonchi or crackles.  Skin: Warm and well-perfused. Skin intact. Neuro/Msk: Normal gait. PERLA. EOMi. Alert. Oriented x3.   Psych: Normal dress. Normal speech.   Assessment/plan: Ann [name] is a 69 y.o. female present for est care/CMC Diabetes mellitus without complication ([place]) Continue Basaglar/glargine 52 units twice daily for now.  NovoLog 20-24 units 3 times daily before meals- per sliding scale.   Patient reports intolerance/allergies:  -Metformin-stomach upset   -Farxiga-dizziness and low BP  -Invokana-low BP  -Victoza-low blood pressure PNA series: Pneumovax series completed - 10/21/2019 Flu shot: UTD 2020 (recommneded yearly) Foot exam: Completed today 04/20/2020 Eye exam: 06/2019, Dr. Oswaldo Conroy Microalbumin: 04/02/2019 A1c is not  at goal of less than 7.0%.  Difficult to alter/increase patient's regimen when she is reporting low blood sugars of 69, which wake her and she needs to take a glucose tab to  sugars above 250.  There are other agents she does not seem to have been tried such as sulfonylureas, DPP4 inhibitors, GLP inhibitors (other than Victoza), levemir insulin per wt> suggest she will likely need ~126 units/d of long acting insulin for adequate control. Although hesitant to increase since she also reports low sugar> may need to consider endocrine for tighter management (which per records she has declined in the past). Pt has forms to be completed for financial assistance on her insulins and pradaxa. Will review and complete if medically appropriate. I am not certain if requirement of other agents are necessary, that may be on her formulary (such as those mentioned above for diabetes or vial vs quickpen for [employer] etc.).  - pt is requesting these specific medications > discussed with her I am happy to help if I can, but I have to read the paperwork first and if appropriate we will complete for her.  If other medication options are needed first, per form criteria, then we will need to explore those options.   A1c: 8.2> 8.0> 8.2> 8.0> 8.1 today   Essential hypertension/HLD/A. Fib/chronic anticoagulation/family history of stroke Continue Benicar HCT 40-25 mg daily, Continue metoprolol tartrate 50 mg twice daily Continue Lasix 20 mg daily as needed Statin and WelChol intolerant  declines all cholesterol-lowering medications. Continue Pradaxa twice daily for now. She has financial assist papers for this medication as well- uncertain if there is a more  affordable option for her on her formulary with an affordable co-pay. I do not see evidence per her chart any other NOACs have been tried.  -Continue follow-up with cardiology, Dr. Einar Gip.  hypothyroidism Patient reports compliance with levothyroxine 50 mcg daily. -TSH collected-refills will be provided after results received to ensure appropriate dosing. -Yearly laboratory evaluation on this condition.  anxiety Lengthy discussion with today surrounding her medications surrounding her sleep disturbance and anxiety. Agreed to refill her Xanax 0.25 mg daily.  She reports only using approximately 9 tabs in 3 years.  She is aware of side effect potential with class. Patient was made aware of state regulations on controlled substances and [facility] policy on prescribing controlled substances.  UDS collected today Contract signed today [place] controlled substance database reviewed today Patient aware a face-to-face follow-up will be needed if she would need refills.   Patient will be called with all laboratory results once received and further plan/follow-up will be discussed at that time.  Patient will need to be followed every 3-4 months for her chronic conditions and A1c  * -Printed NovoLog, glargine and Pradaxa scripts.  Will be attached to financial assistance forms if appropriate. All other medications have been called into her selected mail-in pharmacy. PRN medicines of Lasix, Flonase and Xanax called to local [facility], per her request Return in about 3 months (around 07/21/2020) for [place] (30 min).  Orders Placed This Encounter  Procedures  . CBC w/Diff  . Comp Met (CMET)  . TSH  . Lipid panel  . DRUG MONITORING, PANEL 8 WITH CONFIRMATION, URINE  . LDL cholesterol, direct  . POCT glycosylated hemoglobin (Hb A1C)   Meds ordered this encounter  Medications  . olmesartan-hydrochlorothiazide (BENICAR HCT) 40-25 MG tablet    Sig: Take 1 tablet by mouth daily.    Dispense:  90  tablet  Refill:  1  . metoprolol tartrate (LOPRESSOR) 50 MG tablet    Sig: Take 1 tablet (50 mg total) by mouth 2 (two) times daily.    Dispense:  180 tablet    Refill:  1  . ALPRAZolam (XANAX) 0.25 MG tablet    Sig: Take 1 tablet (0.25 mg total) by mouth daily as needed for anxiety.    Dispense:  15 tablet    Refill:  1  . DISCONTD: furosemide (LASIX) 20 MG tablet    Sig: Take 1 tablet (20 mg total) by mouth daily as needed for edema.    Dispense:  30 tablet    Refill:  1    Hold until pt request please.  . fluticasone (FLONASE) 50 MCG/ACT nasal spray    Sig: Place 2 sprays into both nostrils daily.    Dispense:  16 g    Refill:  6    Hold until pt request.  . levothyroxine (SYNTHROID) 50 MCG tablet    Sig: Take 1 tablet (50 mcg total) by mouth daily before breakfast.    Dispense:  90 tablet    Refill:  3  . dabigatran (PRADAXA) 150 MG CAPS capsule    Sig: Take 1 capsule (150 mg total) by mouth 2 (two) times daily.    Dispense:  60 capsule    Refill:  3  . Insulin Glargine (BASAGLAR KWIKPEN) 100 UNIT/ML    Sig: Inject 0.52 mLs (52 Units total) into the skin 2 (two) times daily. 52 units in the AM and 52 units in the PM    Dispense:  11 pen    Refill:  3  . insulin aspart (NOVOLOG) 100 UNIT/ML injection    Sig: Inject 20-24 Units into the skin 3 (three) times daily before meals. Sliding scale based on blood sugar. <200 20 units, 200-250 22 units, 250-300 24 units    Dispense:  30 mL    Refill:  3   Referral Orders  No referral(s) requested today    Greater than 56 minutes was spent face-to-face with patient, greater than 75 minutes spent on documentation, forms, medications, reviewing prior labs, reviewing prior medications tried and building chart. Of note, patient did report she had more to discuss with this provider.  Explained to her time just does not allow for [place] to cover any more topics then we have and she will need to return if she has more topics to  cover.  Note is dictated utilizing voice recognition software. Although note has been proof read prior to signing, occasional typographical errors still can be missed. If any questions arise, please do not hesitate to call for verification.  Electronically signed by: Howard Pouch, DO [place]

## 2020-04-20 NOTE — Patient Instructions (Signed)
We will get your refills today.  With a control substance - we do require a contract and UDS yearly. I know you rarely use, but it is [facility] protocol.   I will get your paperwork filled out for you and we will call when it is complete.   We will call you with lab results and they will post to your my chart account.   Follow up in 4 mos on chronic conditions.    Please help [place] help you:  We are honored you have chosen [facility] for your Primary Care home. Below you will find basic instructions that you may need to access in the future. Please help [place] help you by reading the instructions, which cover many of the frequent questions we experience.   Prescription refills and request:  -In order to allow more efficient response time, please call your pharmacy for all refills. They will forward the request electronically to [place]. This allows for the quickest possible response. Request left on a nurse line can take longer to refill, since these are checked as time allows between office patients and other phone calls.  - refill request can take up to 3-5 working days to complete.  - If request is sent electronically and request is appropiate, it is usually completed in 1-2 business days.  - all patients will need to be seen routinely for all chronic medical conditions requiring prescription medications (see follow-up below). If you are overdue for follow up on your condition, you will be asked to make an appointment and we will call in enough medication to cover you until your appointment (up to 30 days).  - all controlled substances will require a face to face visit to request/refill.  - if you desire your prescriptions to go through a new pharmacy, and have an active script at original pharmacy, you will need to call your pharmacy and have scripts transferred to new pharmacy. This is completed between the pharmacy locations and not by your provider.    Results: Our office handles many outgoing  and incoming calls daily. If we have not contacted you within 1 week about your results, please check your mychart to see if there is a message first and if not, then contact our office.  In helping with this matter, you help decrease call volume, and therefore allow [place] to be able to respond to patients needs more efficiently.  We will always attempt to call you with results,  normal or abnormal. However, if we are unable to reach you we will send a message in your my chart with results.   Acute office visits (sick visit):  An acute visit is intended for a new problem and are scheduled in shorter time slots to allow schedule openings for patients with new problems. This is the appropriate visit to discuss a new problem. Problems will not be addressed by phone call or Echart message. Appointment is needed if requesting treatment. In order to provide you with excellent quality medical care with proper time for you to explain your problem, have an exam and receive treatment with instructions, these appointments should be limited to one new problem per visit. If you experience a new problem, in which you desire to be addressed, please make an acute office visit, we save openings on the schedule to accommodate you. Please do not save your new problem for any other type of visit, let us take care of it properly and quickly for you.   Follow  up visits:  Depending on your condition(s) your provider will need to see you routinely in order to provide you with quality care and prescribe medication(s). Most chronic conditions (Example: hypertension, Diabetes, depression/anxiety... etc), require visits a couple times a year. Your provider will instruct you on proper follow up for your personal medical conditions and history. Please make certain to make follow up appointments for your condition as instructed. Failing to do so could result in lapse in your medication treatment/refills. If you request a refill, and are  overdue to be seen on a condition, we will always provide you with a 30 day script (once) to allow you time to schedule.    Medicare wellness (well visit): - we have a wonderful Nurse Maudie Mercury), that will meet with you and provide you will yearly medicare wellness visits. These visits should occur yearly (can not be scheduled less than 1 calendar year apart) and cover preventive health, immunizations, advance directives and screenings you are entitled to yearly through your medicare benefits. Do not miss out on your entitled benefits, this is when medicare will pay for these benefits to be ordered for you.  These are strongly encouraged by your provider and is the appropriate type of visit to make certain you are up to date with all preventive health benefits. If you have not had your medicare wellness exam in the last 12 months, please make certain to schedule one by calling the office and schedule your medicare wellness with Maudie Mercury as soon as possible.   Yearly physical (well visit):  - Adults are recommended to be seen yearly for physicals. Check with your insurance and date of your last physical, most insurances require one calendar year between physicals. Physicals include all preventive health topics, screenings, medical exam and labs that are appropriate for gender/age and history. You may have fasting labs needed at this visit. This is a well visit (not a sick visit), new problems should not be covered during this visit (see acute visit).  - Pediatric patients are seen more frequently when they are younger. Your provider will advise you on well child visit timing that is appropriate for your their age. - This is not a medicare wellness visit. Medicare wellness exams do not have an exam portion to the visit. Some medicare companies allow for a physical, some do not allow a yearly physical. If your medicare allows a yearly physical you can schedule the medicare wellness with our nurse Maudie Mercury and have your  physical with your provider after, on the same day. Please check with insurance for your full benefits.   Late Policy/No Shows:  - all new patients should arrive 15-30 minutes earlier than appointment to allow [place] time  to  obtain all personal demographics,  insurance information and for you to complete office paperwork. - All established patients should arrive 10-15 minutes earlier than appointment time to update all information and be checked in .  - In our best efforts to run on time, if you are late for your appointment you will be asked to either reschedule or if able, we will work you back into the schedule. There will be a wait time to work you back in the schedule,  depending on availability.  - If you are unable to make it to your appointment as scheduled, please call 24 hours ahead of time to allow [place] to fill the time slot with someone else who needs to be seen. If you do not cancel your appointment ahead of  time, you may be charged a no show fee.

## 2020-04-21 ENCOUNTER — Telehealth: Payer: Self-pay | Admitting: Family Medicine

## 2020-04-21 ENCOUNTER — Encounter: Payer: Self-pay | Admitting: Family Medicine

## 2020-04-21 LAB — DRUG MONITORING, PANEL 8 WITH CONFIRMATION, URINE
6 Acetylmorphine: NEGATIVE ng/mL (ref <10)
Alcohol Metabolites: NEGATIVE ng/mL
Amphetamines: NEGATIVE ng/mL (ref <500)
Benzodiazepines: NEGATIVE ng/mL (ref <100)
Buprenorphine, Urine: NEGATIVE ng/mL (ref <5)
Cocaine Metabolite: NEGATIVE ng/mL (ref <150)
Creatinine: 154.1 mg/dL
MDMA: NEGATIVE ng/mL (ref <500)
Marijuana Metabolite: NEGATIVE ng/mL (ref <20)
Opiates: NEGATIVE ng/mL (ref <100)
Oxidant: NEGATIVE ug/mL
Oxycodone: NEGATIVE ng/mL (ref <100)
pH: 5 (ref 4.5–9.0)

## 2020-04-21 LAB — DM TEMPLATE

## 2020-04-21 NOTE — Telephone Encounter (Signed)
noted 

## 2020-04-21 NOTE — Telephone Encounter (Signed)
Please inform patient the following information: -Liver and thyroid function is within normal range -Kidney function/creatinine is stable. -Her blood cell counts are normal, with the exception of her platelets being very mildly below normal at 141 (above 150 normal).  Although below laboratory normal it is pretty consistent with her prior lab draws. -Her cholesterol panel is elevated.  Total cholesterol 253, LDL/bad cholesterol 163, HDL 39 triglycerides were 236 (not fasting).  I am aware she is intolerant to statins, WelChol and fenofibrate.I would recommend a mediterranean diet. A mediterranean diet is high in fruits, vegetables, whole grains, fish, chicken, nuts, healthy fats (olive oil or canola oil). Low fat dairy. There are many online resources and books on this diet. Limit butter, margarine, red meat and sweets.    -There are also twice monthly injections that can be prescribed to help treat cholesterol-that are not in the same class as any of the medication she is tried in the past if she is interested.  A1c was 8.1.  More diabetic control is needed to reach goal of less than 7%.  There are other oral agents that can be tried that is not on her allergy list or tried in the past per her records. Januvia and Tradjenta follow-up appear to be a level 2 coverage for her.  I would recommend we try adding one of the above if she has not tried in the past with side effect.  Please ask her if she has tried either?   Please make sure she has follow-up in 3 months for her chronic conditions.

## 2020-04-21 NOTE — Telephone Encounter (Signed)
Pt came to office to sign the HIPPA forms, labs reviewed with patient, printed off copy of recommendations. Pt will look up the injectables for Cholesterol and do research, along with the [place]. She will not do Januvia because it almost killed her brother. She forgot her calender and will have to call back to schedule 3 month [facility] appt. She will call when she decides on the two meds.

## 2020-04-22 ENCOUNTER — Encounter: Payer: Self-pay | Admitting: Family Medicine

## 2020-04-29 ENCOUNTER — Telehealth: Payer: Self-pay

## 2020-04-29 NOTE — Telephone Encounter (Signed)
Patient called in wanting to check on the status of her patient assistants paper work for her medications. Says its 3 sets of forms and papers    Please call and advise

## 2020-04-29 NOTE — Telephone Encounter (Signed)
This paperwork was completed and faxed.

## 2020-04-30 NOTE — Telephone Encounter (Signed)
Pt was called and would like copy to pick up. Pt advised we were Waiting for them to upload into the media tab. Pt advised we would watch for them and if not call medical records next week to obtain copy.

## 2020-05-03 ENCOUNTER — Telehealth: Payer: Self-pay

## 2020-05-03 NOTE — Telephone Encounter (Signed)
Copies still not available in chart. Sent Hinton Dyer message to call medical records/scan

## 2020-05-03 NOTE — Telephone Encounter (Signed)
Originals placed up front for patient to pick up, pt called. Copies placed on my desk in case they are needed.

## 2020-05-03 NOTE — Telephone Encounter (Signed)
Can we reach out to records to see if they have this paperwork. It should be 3 different forms from drug companies for discounted insulin. It would have been sent last week to be scanned

## 2020-05-03 NOTE — Telephone Encounter (Signed)
Looks like 16 pages were scanned in today at 12:07pm, pretty sure that's what she is looking for. I printed them out.  Will you please verify they are the correct documents? Thank you.

## 2020-05-03 NOTE — Telephone Encounter (Signed)
Made Copies of papers and placed on my desk. Pt was called and original forms are up front for pt to pick up

## 2020-05-03 NOTE — Telephone Encounter (Signed)
Patient called in wanting to check on her paper work for pick up for patient assistant for her medication. Wanting to know if they had returned to office and if she could come pick up today or tomorrow    Please call and advise

## 2020-05-07 NOTE — Telephone Encounter (Signed)
Received letter from [place] in regards to patients Ann [name]- This was approved on 05/04/2020 and pharmacy will contact pt within the next few business days to set up delivery of insulin. Letter sent to scan.

## 2020-05-19 ENCOUNTER — Telehealth: Payer: Self-pay

## 2020-05-19 NOTE — Telephone Encounter (Signed)
Patient called regarding [place] Patient Assistance Program for her prescription Pradaxa. Patient received communication from the company stating that a prescription was to be included with the documentation that Dr. Raoul Pitch submitted for the patient assistance program.  Please fax # [phone] Case # [id number]

## 2020-05-19 NOTE — Telephone Encounter (Signed)
Please advise if okay to Print RX?

## 2020-05-19 NOTE — Telephone Encounter (Signed)
The copy of this RX is in my records and this was faxed and it says it was faxed to [facility]. These RX's were not included in the documentation to the assistance programs because the directions/quanities were written and signed on the paperwork. Will fax over the actual Rx to the assistance program

## 2020-05-19 NOTE — Telephone Encounter (Signed)
For the record: This provider did print the prescription.  I did sign the prescription.  I did included in the paperwork.  If it has been lost, then  yes it can be reprinted

## 2020-05-20 NOTE — Telephone Encounter (Signed)
Pt was called and told we faxed RX

## 2020-05-21 NOTE — Telephone Encounter (Signed)
Received fax from [employer] that pt was denied for the assistance program due to being over income. Scanned into chart.

## 2020-05-25 LAB — HM MAMMOGRAPHY

## 2020-05-27 ENCOUNTER — Telehealth: Payer: Self-pay

## 2020-05-27 NOTE — Telephone Encounter (Signed)
Please inform patient her mammogram was within normal limits.

## 2020-05-27 NOTE — Telephone Encounter (Signed)
[facility] mammogram completed on 05/24/2020. ACR Bi-RADS Cat 2- Benign. Abstracted into chart.

## 2020-05-27 NOTE — Telephone Encounter (Signed)
Pt was called and given results. 

## 2020-06-28 ENCOUNTER — Telehealth: Payer: Self-pay

## 2020-06-28 NOTE — Telephone Encounter (Signed)
Left message to return call to our office.  If patient calls back let her know that we do not have but should be any time.  the letter we have on [place] was dated 6/30. says office should receive them in10-14 business days.

## 2020-06-28 NOTE — Telephone Encounter (Signed)
Patient is checking to see her Novolog shipment is at the office. She states that it shows that it is "in route".

## 2020-07-07 NOTE — Telephone Encounter (Signed)
Pt picked up and was given all insulin pens and needles.

## 2020-07-07 NOTE — Telephone Encounter (Signed)
Shipment of 6 boxes of Novolog FlexPen syringes and needles received on 07/06/2020 from [employer] Patient Assistance Program. Patient notified today by Caroll Rancher, LPN. Patient states she will pick up today.

## 2020-07-23 ENCOUNTER — Other Ambulatory Visit: Payer: Self-pay

## 2020-07-23 ENCOUNTER — Ambulatory Visit (INDEPENDENT_AMBULATORY_CARE_PROVIDER_SITE_OTHER): Payer: Medicare Other | Admitting: Family Medicine

## 2020-07-23 ENCOUNTER — Encounter: Payer: Self-pay | Admitting: Family Medicine

## 2020-07-23 VITALS — BP 126/79 | HR 86 | Temp 98.1°F | Ht 67.0 in | Wt 284.1 lb

## 2020-07-23 DIAGNOSIS — I059 Rheumatic mitral valve disease, unspecified: Secondary | ICD-10-CM

## 2020-07-23 DIAGNOSIS — E039 Hypothyroidism, unspecified: Secondary | ICD-10-CM | POA: Diagnosis not present

## 2020-07-23 DIAGNOSIS — E119 Type 2 diabetes mellitus without complications: Secondary | ICD-10-CM | POA: Diagnosis not present

## 2020-07-23 DIAGNOSIS — E782 Mixed hyperlipidemia: Secondary | ICD-10-CM | POA: Diagnosis not present

## 2020-07-23 DIAGNOSIS — M109 Gout, unspecified: Secondary | ICD-10-CM | POA: Diagnosis not present

## 2020-07-23 DIAGNOSIS — G479 Sleep disorder, unspecified: Secondary | ICD-10-CM

## 2020-07-23 DIAGNOSIS — I10 Essential (primary) hypertension: Secondary | ICD-10-CM | POA: Diagnosis not present

## 2020-07-23 DIAGNOSIS — I4821 Permanent atrial fibrillation: Secondary | ICD-10-CM

## 2020-07-23 DIAGNOSIS — F419 Anxiety disorder, unspecified: Secondary | ICD-10-CM

## 2020-07-23 LAB — BASIC METABOLIC PANEL
BUN: 27 mg/dL — ABNORMAL HIGH (ref 6–23)
CO2: 27 mEq/L (ref 19–32)
Calcium: 9.5 mg/dL (ref 8.4–10.5)
Chloride: 97 mEq/L (ref 96–112)
Creatinine, Ser: 0.98 mg/dL (ref 0.40–1.20)
GFR: 55.94 mL/min — ABNORMAL LOW (ref >60.00)
Glucose, Bld: 179 mg/dL — ABNORMAL HIGH (ref 70–99)
Potassium: 3.9 mEq/L (ref 3.5–5.1)
Sodium: 137 mEq/L (ref 135–145)

## 2020-07-23 LAB — POCT GLYCOSYLATED HEMOGLOBIN (HGB A1C): Hemoglobin A1C: 8.4 % — AB (ref 4.0–5.6)

## 2020-07-23 LAB — URIC ACID: Uric Acid, Serum: 8.9 mg/dL — ABNORMAL HIGH (ref 2.4–7.0)

## 2020-07-23 MED ORDER — GLIPIZIDE 5 MG PO TABS
ORAL_TABLET | ORAL | 2 refills | Status: DC
Start: 2020-07-23 — End: 2020-08-23

## 2020-07-23 MED ORDER — METOPROLOL TARTRATE 50 MG PO TABS: 50.0000 mg | ORAL_TABLET | Freq: Two times a day (BID) | ORAL | 1 refills | Status: DC

## 2020-07-23 MED ORDER — OLMESARTAN MEDOXOMIL-HCTZ 40-25 MG PO TABS: 1.0000 | ORAL_TABLET | Freq: Every day | ORAL | 1 refills | Status: DC

## 2020-07-23 NOTE — Patient Instructions (Addendum)
Follow up with endocrine (referral placed today) for your diabetes.  Once established with endocrine for diabetes, you would just need to follow here for your blood pressure and other chronic conditions.  We will call you with lab results.    Low-Purine Eating Plan A low-purine eating plan involves making food choices to limit your intake of purine. Purine is a kind of uric acid. Too much uric acid in your blood can cause certain conditions, such as gout and kidney stones. Eating a low-purine diet can help control these conditions. What are tips for following this plan? Reading food labels   Avoid foods with saturated or Trans fat.  Check the ingredient list of grains-based foods, such as bread and cereal, to make sure that they contain whole grains.  Check the ingredient list of sauces or soups to make sure they do not contain meat or fish.  When choosing soft drinks, check the ingredient list to make sure they do not contain high-fructose corn syrup. Shopping  Buy plenty of fresh fruits and vegetables.  Avoid buying canned or fresh fish.  Buy dairy products labeled as low-fat or nonfat.  Avoid buying premade or processed foods. These foods are often high in fat, salt (sodium), and added sugar. Cooking  Use olive oil instead of butter when cooking. Oils like olive oil, canola oil, and sunflower oil contain healthy fats. Meal planning  Learn which foods do or do not affect you. If you find out that a food tends to cause your gout symptoms to flare up, avoid eating that food. You can enjoy foods that do not cause problems. If you have any questions about a food item, talk with your dietitian or health care provider.  Limit foods high in fat, especially saturated fat. Fat makes it harder for your body to get rid of uric acid.  Choose foods that are lower in fat and are lean sources of protein. General guidelines  Limit alcohol intake to no more than 1 drink a day for nonpregnant  women and 2 drinks a day for men. One drink equals 12 oz of beer, 5 oz of wine, or 1 oz of hard liquor. Alcohol can affect the way your body gets rid of uric acid.  Drink plenty of water to keep your urine clear or pale yellow. Fluids can help remove uric acid from your body.  If directed by your health care provider, take a vitamin C supplement.  Work with your health care provider and dietitian to develop a plan to achieve or maintain a healthy weight. Losing weight can help reduce uric acid in your blood. What foods are recommended? The items listed may not be a complete list. Talk with your dietitian about what dietary choices are best for you. Foods low in purines Foods low in purines do not need to be limited. These include:  All fruits.  All low-purine vegetables, pickles, and olives.  Breads, pasta, rice, cornbread, and popcorn. Cake and other baked goods.  All dairy foods.  Eggs, nuts, and nut butters.  Spices and condiments, such as salt, herbs, and vinegar.  Plant oils, butter, and margarine.  Water, sugar-free soft drinks, tea, coffee, and cocoa.  Vegetable-based soups, broths, sauces, and gravies. Foods moderate in purines Foods moderate in purines should be limited to the amounts listed.   cup of asparagus, cauliflower, spinach, mushrooms, or green peas, each day.  2/3 cup uncooked oatmeal, each day.   cup dry wheat bran or wheat germ, each  day.  2-3 ounces of meat or poultry, each day.  4-6 ounces of shellfish, such as crab, lobster, oysters, or shrimp, each day.  1 cup cooked beans, peas, or lentils, each day.  Soup, broths, or bouillon made from meat or fish. Limit these foods as much as possible. What foods are not recommended? The items listed may not be a complete list. Talk with your dietitian about what dietary choices are best for you. Limit your intake of foods high in purines, including:  Beer and other alcohol.  Meat-based gravy or  sauce.  Canned or fresh fish, such as: ? Anchovies, sardines, herring, and tuna. ? Mussels and scallops. ? Codfish, trout, and haddock.  [place] Salines.  Organ meats, such as: ? Liver or kidney. ? Tripe. ? Sweetbreads (thymus gland or pancreas).  Wild [employer] biochemist.  Yeast or yeast extract supplements.  Drinks sweetened with high-fructose corn syrup. Summary  Eating a low-purine diet can help control conditions caused by too much uric acid in the body, such as gout or kidney stones.  Choose low-purine foods, limit alcohol, and limit foods high in fat.  You will learn over time which foods do or do not affect you. If you find out that a food tends to cause your gout symptoms to flare up, avoid eating that food. This information is not intended to replace advice given to you by your health care provider. Make sure you discuss any questions you have with your health care provider. Document Revised: 11/09/2017 Document Reviewed: 01/10/2017 Elsevier Patient Education  2020 [employer].

## 2020-07-23 NOTE — Progress Notes (Signed)
Patient ID: Ann [name], female  DOB: 1951/10/28, 69 y.o.   MRN: [id number] Patient Care Team    Relationship Specialty Notifications Start End  Ma Hillock, DO PCP - General Family Medicine  04/20/20   Adrian Prows, MD Consulting Physician Cardiology  04/20/20   Druscilla Brownie, MD Referring Physician Dermatology  04/20/20   Ann [name], OD Referring Physician   04/20/20   Luci Bank, MD Referring Physician Orthotics  04/20/20   Sonda Primes, MD Referring Physician Obstetrics and Gynecology  04/20/20   Rosana Berger, MD Referring Physician Gastroenterology  04/20/20     Chief Complaint  Patient presents with  . Gout    Wants uric acid level.    Subjective: Ann [name] is a 69 y.o.  female present for [facility] follow up Diabetes mellitus without complication ([place]) Pt reports compliance  with Basaglar/glargine 52 units twice daily and NovoLog 20-24 units 3 times daily before meals. Patient denies dizziness, hyperglycemic or hypoglycemic events. Patient denies numbness, tingling in the extremities or nonhealing wounds of feet.  She states she has mild neuropathy in bilateral feet.  Pt reports BG ranges have been higher over the last few weeks secondary to a gout flare.   Prior records report patient's diabetes has not been well controlled since 2017. Forms completed at establish appointment May 2021 for financial assistance on insulin and Pradaxa. Patient reports intolerance/allergies:  -Metformin-stomach upset  -Farxiga-dizziness and low BP  -Invokana-low BP  -Victoza-low blood pressure -She is fearful to try Januvia secondary to what sounds like a rather significant reaction her brother experienced with use of Januvia PNA series: Pneumovax 23- 10/21/2019 Flu shot: UTD 2020 (recommneded yearly) Foot exam: Completed  04/20/2020 Eye exam: Completed 06/2019-due yearly Microalbumin: 04/02/2019 A1c: 8.2> 8.0> 8.2> 8.0> 8.1>8.4 today  Essential hypertension/HLD/A. Fib Pt  reports compliance with Benicar HCT 40-25 mg daily, metoprolol tartrate 50 mg twice daily, Lasix 20 mg as needed and Pradaxa twice daily.  She states she rarely needs to use Lasix.  Mainly only after salty meals, if she has leg swelling. Patient denies chest pain, shortness of breath, dizziness or lower extremity edema.  Pt does not take a daily baby ASA.  Pt is reports allergies to all statins, WelChol and elects not to take any medications for her cholesterol.  Injectable such as Praluent or Repatha were discussed with her, and she declined start. RF: Hypertension, hyperlipidemia, diabetes, obesity, strong family history of CVA  hypothyroidism Patient reports compliance with levothyroxine 50 mcg daily.  Labs up-to-date 04/2020  Gout: This is a new problem for patient.  She suffered from rather significant bilateral lower extremity ankle/foot pain and was seen at the urgent care.  I uric acid level was completed and reports a levels around 9.8.  She was started on colchicine as needed.  Depression screen [facility] 2/9 07/23/2020 04/20/2020  Decreased Interest 0 0  Down, Depressed, Hopeless 0 0  PHQ - 2 Score 0 0   No flowsheet data found.     Fall Risk  07/23/2020  Falls in the past year? 1  Number falls in past yr: 0  Injury with Fall? 1    Immunization History  Administered Date(s) Administered  . Influenza-Unspecified 09/26/2019  . Moderna SARS-COVID-2 Vaccination 01/22/2020, 02/20/2020  . Pneumococcal Conjugate-13 10/12/2014  . Pneumococcal Polysaccharide-23 10/21/2019  . Tdap 08/04/2019    No exam data present  Past Medical History:  Diagnosis Date  . Arthritis    osteoarthritis- knees.  Marland Kitchen  Atrial fibrillation ([place])    Dr. Marcello Moores status post cardioversion chronic anticoagulation  . Chicken pox   . Colon cancer ([place]) 2001   Radiation, chemo.  Adenocarcinoma of the colon.  Followed by Dr. Derrel Nip  . Complication of anesthesia    sensitive to narcotics"extra sleepy". Versed- "no  memory recall for 4 days postop"  . Diabetes mellitus without complication (HCC)    oral and Insulin  . Hepatitis    "sub clinical case of Hepatitis in late 70's""has immunities"  . Hypertension   . Hypothyroidism   . Laceration of head 08/04/2019   8 cm laceration forehead/scalp after tripping when mowing her lawn.  . Melanoma ([place])    wide excision near scapula  . Mixed hyperlipidemia   . 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  . Ovarian teratoma, right 09/2000   Oophorectomy  . Sleep apnea    unable to tolreate cpap  . Wisdom teeth extracted    Allergies  Allergen Reactions  . Amoxicillin Shortness Of Breath, Swelling and Anaphylaxis    Has patient had a PCN reaction causing immediate rash, facial/tongue/throat swelling, SOB or lightheadedness with hypotension: Yes Has patient had a PCN reaction causing severe rash involving mucus membranes or skin necrosis: No Has patient had a PCN reaction that required hospitalization No Has patient had a PCN reaction occurring within the last 10 years: Yes If all of the above answers are "NO", then may proceed with Cephalosporin use.  Has patient had a PCN reaction causing immediate rash, facial/tongue/throat swelling, SOB or lightheadedness with hypotension: Yes Has patient had a PCN reaction causing severe rash involving mucus membranes or skin necrosis: No Has patient had a PCN reaction that required hospitalization No Has patient had a PCN reaction occurring within the last 10 years: Yes If all of the above answers are "NO", then may proceed with Cephalosporin use. Has patient had a PCN reaction causing immediate rash, facial/tongue/throat swelling, SOB or lightheadedness with hypotension: Yes Has patient had a PCN reaction causing severe rash involving mucus membranes or skin necrosis: No Has patient had a PCN reaction that required hospitalization No Has patient had a PCN reaction occurring within  the last 10 years: Yes If all of the above answers are "NO", then may proceed with Cephalosporin use.   . Codeine Itching    Tolerates hydrocodone  . Livalo [Pitavastatin]   . Statins Other (See Comments)    Muscle skeletal side effects  . Welchol [Colesevelam]     Gi se   Past Surgical History:  Procedure Laterality Date  . ABDOMINAL HYSTERECTOMY  09/2000    Total hysterectomy and oophorectomy  . ABDOMINAL WOUND DEHISCENCE Bilateral    multiple times / mesh use with multiple abdominal hernia repair  . CARDIOVERSION  2014   Unsuccessful.  Patient reports cardiac ablation was not felt to be appropriate next step by cardiology.  . CHOLECYSTECTOMY  04/1998   '98 -Lapraroscopic  . COLON RESECTION  2001   "colon cancer"  . FOOT SURGERY Bilateral    multiple "hammer toes and bunionectomies"  . KNEE ARTHROSCOPY Left 08/2006   meniscectomy  . MELANOMA EXCISION     near scapula  . TOTAL KNEE ARTHROPLASTY Right 01/24/2016   Procedure: RIGHT TOTAL KNEE ARTHROPLASTY;  Surgeon: Gaynelle Arabian, MD;  Location: [facility];  Service: Orthopedics;  Laterality: Right;   Family History  Problem Relation Age of Onset  . Arthritis Mother   . Colon  cancer Mother   . Diabetes Mother   . Atrial fibrillation Mother   . Diabetes Father   . Heart attack Father   . Heart disease Father   . Stroke Father   . Hypertension Maternal Grandmother   . Stroke Maternal Grandmother   . Stroke Maternal Grandfather   . Hypertension Paternal Grandmother   . Stroke Paternal Grandmother   . Diabetes Paternal Grandfather   . Hypertension Paternal Grandfather   . Stroke Paternal Grandfather   . Diabetes Brother   . Brain cancer Brother   . Heart disease Brother   . Hypertension Brother   . Stroke Brother    Social History   Social History Narrative   Marital status/children/pets: Single. G0P0.   Education/employment: Retired Oceanographer:      -smoke alarm in the home:Yes       - wears seatbelt: Yes     - Feels safe in their relationships: Yes    Allergies as of 07/23/2020      Reactions   Amoxicillin Shortness Of Breath, Swelling, Anaphylaxis   Has patient had a PCN reaction causing immediate rash, facial/tongue/throat swelling, SOB or lightheadedness with hypotension: Yes Has patient had a PCN reaction causing severe rash involving mucus membranes or skin necrosis: No Has patient had a PCN reaction that required hospitalization No Has patient had a PCN reaction occurring within the last 10 years: Yes If all of the above answers are "NO", then may proceed with Cephalosporin use. Has patient had a PCN reaction causing immediate rash, facial/tongue/throat swelling, SOB or lightheadedness with hypotension: Yes Has patient had a PCN reaction causing severe rash involving mucus membranes or skin necrosis: No Has patient had a PCN reaction that required hospitalization No Has patient had a PCN reaction occurring within the last 10 years: Yes If all of the above answers are "NO", then may proceed with Cephalosporin use. Has patient had a PCN reaction causing immediate rash, facial/tongue/throat swelling, SOB or lightheadedness with hypotension: Yes Has patient had a PCN reaction causing severe rash involving mucus membranes or skin necrosis: No Has patient had a PCN reaction that required hospitalization No Has patient had a PCN reaction occurring within the last 10 years: Yes If all of the above answers are "NO", then may proceed with Cephalosporin use.   Codeine Itching   Tolerates hydrocodone   Livalo [pitavastatin]    Statins Other (See Comments)   Muscle skeletal side effects   Welchol [colesevelam]    Gi se      Medication List       Accurate as of July 23, 2020 11:59 PM. If you have any questions, ask your nurse or doctor.        ALPRAZolam 0.25 MG tablet Commonly known as: XANAX Take 1 tablet (0.25 mg total) by mouth daily as needed for  anxiety.   Basaglar KwikPen 100 UNIT/ML Inject 0.52 mLs (52 Units total) into the skin 2 (two) times daily. 52 units in the AM and 52 units in the PM   Biotin 1000 MCG tablet Take 1,000 mcg by mouth daily.   colchicine 0.6 MG tablet Take 1 tablet (0.6 mg total) by mouth daily. What changed:   how much to take  when to take this Changed by: Howard Pouch, DO   dabigatran 150 MG Caps capsule Commonly known as: PRADAXA Take 1 capsule (150 mg total) by mouth 2 (two) times daily.   EpiPen 2-Pak 0.3 mg/0.3 mL Soaj  injection Generic drug: EPINEPHrine Inject 0.3 mg into the skin as needed. Bee stings   fluticasone 50 MCG/ACT nasal spray Commonly known as: FLONASE Place 2 sprays into both nostrils daily. What changed:   when to take this  reasons to take this   glipiZIDE 5 MG tablet Commonly known as: GLUCOTROL 1/2 tab with lunch Started by: Howard Pouch, DO   insulin aspart 100 UNIT/ML injection Commonly known as: novoLOG Inject 20-24 Units into the skin 3 (three) times daily before meals. Sliding scale based on blood sugar. <200 20 units, 200-250 22 units, 250-300 24 units   levothyroxine 50 MCG tablet Commonly known as: SYNTHROID Take 1 tablet (50 mcg total) by mouth daily before breakfast.   Magnesium 500 MG Caps Take 1 capsule by mouth daily.   metoprolol tartrate 50 MG tablet Commonly known as: LOPRESSOR Take 1 tablet (50 mg total) by mouth 2 (two) times daily.   olmesartan-hydrochlorothiazide 40-25 MG tablet Commonly known as: BENICAR HCT Take 1 tablet by mouth daily.   QC Tumeric Complex 500 MG Caps Generic drug: Turmeric Take 1 tablet by mouth.   TART CHERRY PO Take 1 tablet by mouth.   Vitamin D (Cholecalciferol) 25 MCG (1000 UT) Caps Take 2 capsules by mouth daily.       All past medical history, surgical history, allergies, family history, immunizations andmedications were updated in the EMR today and reviewed under the history and medication  portions of their EMR.     ROS: 14 pt review of systems performed and negative (unless mentioned in an HPI)  Objective: BP 126/79 (BP Location: Left Arm, Patient Position: Sitting, Cuff Size: Large)   Pulse 86   Temp 98.1 F (36.7 C) (Oral)   Ht 5\' 7"  (1.702 m)   Wt 284 lb 1.6 oz (128.9 kg)   SpO2 96%   BMI 44.50 kg/m  Gen: Afebrile. No acute distress.  Nontoxic in presentation.  Obese, pleasant female. HENT: AT. .  Eyes:Pupils Equal Round Reactive to light, Extraocular movements intact,  Conjunctiva without redness, discharge or icterus. CV: RRR no murmur, no edema, +2/4 P posterior tibialis pulses Chest: CTAB, no wheeze or crackles MSK: No current erythema or swelling of lower extremities. Skin: no rashes, purpura or petechiae.  Neuro:  Normal gait. PERLA. EOMi. Alert. Oriented x3  Psych: Normal affect, dress and demeanor. Normal speech. Normal thought content and judgment.  Assessment/plan: ELFRIEDE BONINI is a 69 y.o. female present for est care/[facility] Diabetes mellitus without complication (HCC) Continue Basaglar/glargine 52 units twice daily for now.  NovoLog 20-24 units 3 times daily before meals- per sliding scale.   Discussed sulfonylurea class and she is agreeable to try this today.  Started very low-dose to be taken with lunch.  Can be titrated at follow-up appointment. Patient reports intolerance/allergies:  -Metformin-stomach upset  -Farxiga-dizziness and low BP  -Invokana-low BP  -Victoza-low blood pressure - Fearful of Januvia, declines trial secondary to brothers reaction PNA series: Pneumovax series completed - 10/21/2019 Flu shot: UTD 2020 (recommneded yearly) Foot exam: Completed  04/20/2020 Eye exam: 06/2019, Dr. Oswaldo Conroy Microalbumin: 04/02/2019> on Arb A1c is not at goal of less than 7.0%.  Difficult to alter/increase patient's regimen when she is reporting low blood sugars of 69, which wake her and she needs to take a glucose tab to  sugars above 250.  There  are other agents she does not seem to have been tried such as sulfonylureas, DPP4 inhibitors, GLP inhibitors (other than Victoza), levemir insulin  per wt> suggest she will likely need ~126 units/d of long acting insulin for adequate control. Although hesitant to increase since she also reports low sugar> patient is agreeable to endocrine referral today. Pt has forms to be completed for financial assistance on her insulins and pradaxa. Fructosamine collected today to add to information surrounding her sugars over the last few weeks. -Endocrine referral placed A1c: 8.2> 8.0> 8.2> 8.0> 8.1> 8.4  today   Essential hypertension/HLD/A. Fib/chronic anticoagulation/family history of stroke -Stable Continue Benicar HCT 40-25 mg daily, Continue metoprolol tartrate 50 mg twice daily Continue Lasix 20 mg daily as needed Statin and WelChol intolerant  declines all cholesterol-lowering medications including injectables. Continue Pradaxa twice daily-completed financial assistance papers 04/2020   -Continue follow-up with cardiology, Dr. Einar Gip.  hypothyroidism Continue levothyroxine 50 mcg daily.  Gout: -Uric acid levels collected today. -Continue colchicine daily for 4 weeks while starting allopurinol taper. -Allopurinol/uric acid goal ~6 or less -Patient to follow-up in 4 weeks  anxiety Lengthy discussion with today surrounding her medications surrounding her sleep disturbance and anxiety appointment 04/2020 Continue Xanax 0.25 mg daily.  She reports only using approximately 9 tabs in 3 years.  She is aware of side effect potential with class. Patient was made aware of state regulations on controlled substances and [facility]/ policy on prescribing controlled substances.  UDS UTD 04/2020 Contract 04/2020 [facility] controlled substance database reviewed  Patient aware a face-to-face follow-up will be needed if she would need refills. She does not need refills today.  Chronic medications have  been called into  mail-in pharmacy.  PRN medicines of Lasix, Flonase and Xanax called to local [facility], per her request.   Allopurinol, glipizide and colchicine also called into local pharmacy until tapering completed.  No follow-ups on file.  Orders Placed This Encounter  Procedures  . Fructosamine  . Uric acid  . Basic Metabolic Panel (BMET)  . Ambulatory referral to Endocrinology  . POCT glycosylated hemoglobin (Hb A1C)   Meds ordered this encounter  Medications  . glipiZIDE (GLUCOTROL) 5 MG tablet    Sig: 1/2 tab with lunch    Dispense:  30 tablet    Refill:  2  . metoprolol tartrate (LOPRESSOR) 50 MG tablet    Sig: Take 1 tablet (50 mg total) by mouth 2 (two) times daily.    Dispense:  180 tablet    Refill:  1  . olmesartan-hydrochlorothiazide (BENICAR HCT) 40-25 MG tablet    Sig: Take 1 tablet by mouth daily.    Dispense:  90 tablet    Refill:  1  . colchicine 0.6 MG tablet    Sig: Take 1 tablet (0.6 mg total) by mouth daily.    Dispense:  30 tablet    Refill:  0    Referral Orders     Ambulatory referral to Endocrinology   Note is dictated utilizing voice recognition software. Although note has been proof read prior to signing, occasional typographical errors still can be missed. If any questions arise, please do not hesitate to call for verification.  Electronically signed by: Howard Pouch, DO [place]

## 2020-07-26 ENCOUNTER — Telehealth: Payer: Self-pay

## 2020-07-26 NOTE — Telephone Encounter (Signed)
Calling about test results  Should she keep appt with endo doctor in October  When should she make follow appt with Dr. Raoul Pitch?  Please call [phone] - patient okay to call tomorrow 8/17

## 2020-07-27 MED ORDER — COLCHICINE 0.6 MG PO TABS: 0.6000 mg | ORAL_TABLET | Freq: Every day | ORAL | 0 refills | Status: DC

## 2020-07-27 MED ORDER — ALLOPURINOL 100 MG PO TABS: ORAL_TABLET | ORAL | 0 refills | Status: DC

## 2020-07-27 NOTE — Telephone Encounter (Signed)
Patient was advised results have not been annotated on yet but would receive a call back once completed. She is also aware to keep the endocrinology appt for October. She was under the impression another a1c test would be done for more accurate glucose readings for the past 2 weeks.  Please advise, thanks.

## 2020-07-27 NOTE — Telephone Encounter (Signed)
Please inform patient I am still waiting on the fructosamine to result from the lab.  When it does result we will call her with those results and recommendations. The available results so far show stable kidney function.  Her uric acid level was still 8.9.  I did go ahead and send in allopurinol for her to start tapering.  Instructions will be on Aleve appropriately she is to take a half a tab daily for 2 weeks and then increase to 1 tab daily.  Follow-up in 4 weeks  with provider and we will also follow-up her uric acid that day.  Allopurinol dose is tapered until uric acid levels are around 6 or lower. I would recommend she continue the colchicine once daily for the first 4 weeks to ensure the allopurinol start does not cause her gout to flare since her uric acids are still 8.9.  Please ask her if she has enough colchicine to take daily for 4 weeks, if not I will call this in for her as well.   Again, once fructosamine levels come back we will call her with those results and guidance.

## 2020-07-27 NOTE — Telephone Encounter (Signed)
Spoke with patient and she was advised of available results. She will need a new prescription for colchicine. Pharmacy verified, [place].

## 2020-07-28 ENCOUNTER — Telehealth: Payer: Self-pay | Admitting: Family Medicine

## 2020-07-28 LAB — FRUCTOSAMINE: Fructosamine: 345 umol/L — ABNORMAL HIGH (ref 205–285)

## 2020-07-28 NOTE — Telephone Encounter (Signed)
Please Call patient Her fructosamine level is elevated at 345.  This translates to the same as the A1c that she had in the office of 8.4.   The fructosamine gives [place] information of the last 2-3 weeks average sugars, as opposed to the A1c looks over average of 3 months.   She had been concerned her A1c would be incorrectly high secondary to a gout flare she had been fighting over the last 2-3 weeks.  With the fructosamine at 345 and her prior A1c 8.2  three months ago, her A1c would still have been above 8 even with the recent Flare. Repeating A1c in a few weeks would not be beneficial since her gout flare was just over the last 2 to 3 weeks. It would take another 2-1/2 to 3 months for that flare not to be reflective on her A1c.  At that time she will be due for her routine diabetic follow-up and A1c.  We will discuss in more detail at her 4-week follow-up appointment for her gout and allopurinol tapering appointment.  Thanks

## 2020-07-28 NOTE — Telephone Encounter (Signed)
Pt voiced understanding and will discuss more at upcoming appt.

## 2020-07-28 NOTE — Telephone Encounter (Signed)
completed

## 2020-07-29 ENCOUNTER — Encounter: Payer: Self-pay | Admitting: Family Medicine

## 2020-08-19 ENCOUNTER — Other Ambulatory Visit: Payer: Self-pay | Admitting: Family Medicine

## 2020-08-23 ENCOUNTER — Other Ambulatory Visit: Payer: Self-pay

## 2020-08-23 ENCOUNTER — Ambulatory Visit (INDEPENDENT_AMBULATORY_CARE_PROVIDER_SITE_OTHER): Payer: Medicare Other | Admitting: Family Medicine

## 2020-08-23 ENCOUNTER — Encounter: Payer: Self-pay | Admitting: Family Medicine

## 2020-08-23 VITALS — BP 136/81 | HR 89 | Temp 98.1°F | Resp 16 | Wt 286.2 lb

## 2020-08-23 DIAGNOSIS — M109 Gout, unspecified: Secondary | ICD-10-CM

## 2020-08-23 MED ORDER — GLIPIZIDE 5 MG PO TABS: ORAL_TABLET | ORAL | 0 refills | Status: DC

## 2020-08-23 NOTE — Progress Notes (Signed)
Patient ID: Ann [name], female  DOB: [birth year], 69 y.o.   MRN: [id number] Patient Care Team    Relationship Specialty Notifications Start End  Ma Hillock, DO PCP - General Family Medicine  04/20/20   Adrian Prows, MD Consulting Physician Cardiology  04/20/20   Druscilla Brownie, MD Referring Physician Dermatology  04/20/20   Otelia Sergeant, OD Referring Physician   04/20/20   Luci Bank, MD Referring Physician Orthotics  04/20/20   Sonda Primes, MD Referring Physician Obstetrics and Gynecology  04/20/20   Rosana Berger, MD Referring Physician Gastroenterology  04/20/20     Chief Complaint  Patient presents with  . Follow-up    [facility]    Subjective: Ann [name] is a 69 y.o.  female present for follow-up after initiation of gout treatment. Gout: Patient reports she has been taking the colchicine as directed and has just completed the 4-week course while starting the allopurinol taper up to 100 mg daily.  She has not had any additional gout flares.  She has noticed some mild flatulence and mild bowel changes that are not bothersome to her but noteworthy.  She had started allopurinol, colchicine and glipizide at the same time, when the side effects arose. Uric acid: 9.8> 8.9> started allopurinol> collected today Prior note: She suffered from rather significant bilateral lower extremity ankle/foot pain and was seen at the urgent care.  I uric acid level was completed and reports a levels around 9.8.  She was started on colchicine as needed.  Depression screen [facility] 2/9 07/23/2020 04/20/2020  Decreased Interest 0 0  Down, Depressed, Hopeless 0 0  PHQ - 2 Score 0 0   No flowsheet data found.     Fall Risk  07/23/2020  Falls in the past year? 1  Number falls in past yr: 0  Injury with Fall? 1    Immunization History  Administered Date(s) Administered  . Influenza-Unspecified 09/26/2019  . Moderna SARS-COVID-2 Vaccination 01/22/2020, 02/20/2020  . Pneumococcal Conjugate-13  10/12/2014  . Pneumococcal Polysaccharide-23 10/21/2019  . Tdap 08/04/2019    No exam data present  Past Medical History:  Diagnosis Date  . Arthritis    osteoarthritis- knees.  . Atrial fibrillation ([place])    Dr. Marcello Moores status post cardioversion chronic anticoagulation  . Chicken pox   . Colon cancer ([place]) 2001   Radiation, chemo.  Adenocarcinoma of the colon.  Followed by Dr. Derrel Nip  . Complication of anesthesia    sensitive to narcotics"extra sleepy". Versed- "no memory recall for 4 days postop"  . Diabetes mellitus without complication ([facility])    oral and Insulin  . Hepatitis    "sub clinical case of Hepatitis in late 70's""has immunities"  . Hypertension   . Hypothyroidism   . Laceration of head 08/04/2019   8 cm laceration forehead/scalp after tripping when mowing her lawn.  . Melanoma ([place])    wide excision near scapula  . Mixed hyperlipidemia   . 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  . Ovarian teratoma, right 09/2000   Oophorectomy  . Sleep apnea    unable to tolreate cpap  . Wisdom teeth extracted    Allergies  Allergen Reactions  . Amoxicillin Shortness Of Breath, Swelling and Anaphylaxis    Has patient had a PCN reaction causing immediate rash, facial/tongue/throat swelling, SOB or lightheadedness with hypotension: Yes Has patient had a PCN reaction causing severe rash involving mucus membranes or skin necrosis: No Has  patient had a PCN reaction that required hospitalization No Has patient had a PCN reaction occurring within the last 10 years: Yes If all of the above answers are "NO", then may proceed with Cephalosporin use.  Has patient had a PCN reaction causing immediate rash, facial/tongue/throat swelling, SOB or lightheadedness with hypotension: Yes Has patient had a PCN reaction causing severe rash involving mucus membranes or skin necrosis: No Has patient had a PCN reaction that required hospitalization No Has  patient had a PCN reaction occurring within the last 10 years: Yes If all of the above answers are "NO", then may proceed with Cephalosporin use. Has patient had a PCN reaction causing immediate rash, facial/tongue/throat swelling, SOB or lightheadedness with hypotension: Yes Has patient had a PCN reaction causing severe rash involving mucus membranes or skin necrosis: No Has patient had a PCN reaction that required hospitalization No Has patient had a PCN reaction occurring within the last 10 years: Yes If all of the above answers are "NO", then may proceed with Cephalosporin use.   . Codeine Itching    Tolerates hydrocodone  . Livalo [Pitavastatin]   . Statins Other (See Comments)    Muscle skeletal side effects  . Welchol [Colesevelam]     Gi se   Past Surgical History:  Procedure Laterality Date  . ABDOMINAL HYSTERECTOMY  09/2000    Total hysterectomy and oophorectomy  . ABDOMINAL WOUND DEHISCENCE Bilateral    multiple times / mesh use with multiple abdominal hernia repair  . CARDIOVERSION  2014   Unsuccessful.  Patient reports cardiac ablation was not felt to be appropriate next step by cardiology.  . CHOLECYSTECTOMY  04/1998   '98 -Lapraroscopic  . COLON RESECTION  2001   "colon cancer"  . FOOT SURGERY Bilateral    multiple "hammer toes and bunionectomies"  . KNEE ARTHROSCOPY Left 08/2006   meniscectomy  . MELANOMA EXCISION     near scapula  . TOTAL KNEE ARTHROPLASTY Right 01/24/2016   Procedure: RIGHT TOTAL KNEE ARTHROPLASTY;  Surgeon: Gaynelle Arabian, MD;  Location: [facility];  Service: Orthopedics;  Laterality: Right;   Family History  Problem Relation Age of Onset  . Arthritis Mother   . Colon cancer Mother   . Diabetes Mother   . Atrial fibrillation Mother   . Diabetes Father   . Heart attack Father   . Heart disease Father   . Stroke Father   . Hypertension Maternal Grandmother   . Stroke Maternal Grandmother   . Stroke Maternal Grandfather   . Hypertension  Paternal Grandmother   . Stroke Paternal Grandmother   . Diabetes Paternal Grandfather   . Hypertension Paternal Grandfather   . Stroke Paternal Grandfather   . Diabetes Brother   . Brain cancer Brother   . Heart disease Brother   . Hypertension Brother   . Stroke Brother    Social History   Social History Narrative   Marital status/children/pets: Single. G0P0.   Education/employment: Retired Oceanographer:      -smoke alarm in the home:Yes     - wears seatbelt: Yes     - Feels safe in their relationships: Yes    Allergies as of 08/23/2020      Reactions   Amoxicillin Shortness Of Breath, Swelling, Anaphylaxis   Has patient had a PCN reaction causing immediate rash, facial/tongue/throat swelling, SOB or lightheadedness with hypotension: Yes Has patient had a PCN reaction causing severe rash involving mucus membranes or skin necrosis: No  Has patient had a PCN reaction that required hospitalization No Has patient had a PCN reaction occurring within the last 10 years: Yes If all of the above answers are "NO", then may proceed with Cephalosporin use. Has patient had a PCN reaction causing immediate rash, facial/tongue/throat swelling, SOB or lightheadedness with hypotension: Yes Has patient had a PCN reaction causing severe rash involving mucus membranes or skin necrosis: No Has patient had a PCN reaction that required hospitalization No Has patient had a PCN reaction occurring within the last 10 years: Yes If all of the above answers are "NO", then may proceed with Cephalosporin use. Has patient had a PCN reaction causing immediate rash, facial/tongue/throat swelling, SOB or lightheadedness with hypotension: Yes Has patient had a PCN reaction causing severe rash involving mucus membranes or skin necrosis: No Has patient had a PCN reaction that required hospitalization No Has patient had a PCN reaction occurring within the last 10 years: Yes If all  of the above answers are "NO", then may proceed with Cephalosporin use.   Codeine Itching   Tolerates hydrocodone   Livalo [pitavastatin]    Statins Other (See Comments)   Muscle skeletal side effects   Welchol [colesevelam]    Gi se      Medication List       Accurate as of August 23, 2020  1:32 PM. If you have any questions, ask your nurse or doctor.        STOP taking these medications   colchicine 0.6 MG tablet Stopped by: Howard Pouch, DO   Magnesium 500 MG Caps Stopped by: Howard Pouch, DO   QC Tumeric Complex 500 MG Caps Generic drug: Turmeric Stopped by: Howard Pouch, DO     TAKE these medications   allopurinol 100 MG tablet Commonly known as: ZYLOPRIM 50 mg (half tab) for 2 weeks, then increase to 1 tab (100 mg) daily   ALPRAZolam 0.25 MG tablet Commonly known as: XANAX Take 1 tablet (0.25 mg total) by mouth daily as needed for anxiety.   Basaglar KwikPen 100 UNIT/ML Inject 0.52 mLs (52 Units total) into the skin 2 (two) times daily. 52 units in the AM and 52 units in the PM   Biotin 1000 MCG tablet Take 1,000 mcg by mouth daily.   dabigatran 150 MG Caps capsule Commonly known as: PRADAXA Take 1 capsule (150 mg total) by mouth 2 (two) times daily.   EpiPen 2-Pak 0.3 mg/0.3 mL Soaj injection Generic drug: EPINEPHrine Inject 0.3 mg into the skin as needed. Bee stings   fluticasone 50 MCG/ACT nasal spray Commonly known as: FLONASE Place 2 sprays into both nostrils daily. What changed:   when to take this  reasons to take this   glipiZIDE 5 MG tablet Commonly known as: GLUCOTROL 1 tab daily with meal. What changed: additional instructions Changed by: Howard Pouch, DO   insulin aspart 100 UNIT/ML injection Commonly known as: novoLOG Inject 20-24 Units into the skin 3 (three) times daily before meals. Sliding scale based on blood sugar. <200 20 units, 200-250 22 units, 250-300 24 units   levothyroxine 50 MCG tablet Commonly known as:  SYNTHROID Take 1 tablet (50 mcg total) by mouth daily before breakfast.   metoprolol tartrate 50 MG tablet Commonly known as: LOPRESSOR Take 1 tablet (50 mg total) by mouth 2 (two) times daily.   olmesartan-hydrochlorothiazide 40-25 MG tablet Commonly known as: BENICAR HCT Take 1 tablet by mouth daily.   TART CHERRY PO Take 1 tablet by mouth.  Vitamin D (Cholecalciferol) 25 MCG (1000 UT) Caps Take 2 capsules by mouth daily.       All past medical history, surgical history, allergies, family history, immunizations andmedications were updated in the EMR today and reviewed under the history and medication portions of their EMR.     ROS: 14 pt review of systems performed and negative (unless mentioned in an HPI)  Objective: BP 136/81 (BP Location: Left Arm, Patient Position: Sitting, Cuff Size: Large)   Pulse 89   Temp 98.1 F (36.7 C) (Oral)   Resp 16   Wt 286 lb 3.2 oz (129.8 kg)   SpO2 98%   BMI 44.83 kg/m  Gen: Afebrile. No acute distress.  HENT: AT. [place].  Eyes:Pupils Equal Round Reactive to light, Extraocular movements intact,  Conjunctiva without redness, discharge or icterus. MSK: Joints without swelling or erythema. Skin: no rashes, purpura or petechiae.  Neuro:  Normal gait. PERLA. EOMi. Alert. Oriented x3  Assessment/plan: Ann [name] is a 69 y.o. female present for  Gout: -Uric acid levels re-collected today since starting allopurinol.  -Continue as needed for acute flares: Colchicine 0.6 mg (1 tab) 3 times daily day 1, then 1 tab daily until flare resolved - allopurinol tapering after labs resulted if needed.  -Allopurinol/uric acid goal ~6 or less Allopurinol, glipizide and colchicine also called into local pharmacy until tapering completed.  Diabetes: She has an appointment to establish with endocrine mid-October. She is tolerating the glipizide 2.5 mg addition.  We briefly discussed this today and she was encouraged to increase her glipizide 5 mg  daily (1 tab).    Return if symptoms worsen or fail to improve.  Orders Placed This Encounter  Procedures  . Uric acid  . Basic Metabolic Panel (BMET)   Meds ordered this encounter  Medications  . glipiZIDE (GLUCOTROL) 5 MG tablet    Sig: 1 tab daily with meal.    Dispense:  90 tablet    Refill:  0   Referral Orders  No referral(s) requested today     Note is dictated utilizing voice recognition software. Although note has been proof read prior to signing, occasional typographical errors still can be missed. If any questions arise, please do not hesitate to call for verification.  Electronically signed by: Howard Pouch, DO [place]

## 2020-08-23 NOTE — Patient Instructions (Signed)
We will call you with lab results and taper your allopurinol per those results.    I am glad your gout is better controlled.

## 2020-08-24 ENCOUNTER — Telehealth: Payer: Self-pay | Admitting: Family Medicine

## 2020-08-24 LAB — BASIC METABOLIC PANEL
BUN: 29 mg/dL — ABNORMAL HIGH (ref 6–23)
CO2: 27 mEq/L (ref 19–32)
Calcium: 9.2 mg/dL (ref 8.4–10.5)
Chloride: 98 mEq/L (ref 96–112)
Creatinine, Ser: 1.09 mg/dL (ref 0.40–1.20)
GFR: 49.47 mL/min — ABNORMAL LOW (ref >60.00)
Glucose, Bld: 199 mg/dL — ABNORMAL HIGH (ref 70–99)
Potassium: 3.9 mEq/L (ref 3.5–5.1)
Sodium: 135 mEq/L (ref 135–145)

## 2020-08-24 LAB — URIC ACID: Uric Acid, Serum: 6.3 mg/dL (ref 2.4–7.0)

## 2020-08-24 MED ORDER — COLCHICINE 0.6 MG PO TABS
ORAL_TABLET | ORAL | 0 refills | Status: DC
Start: 2020-08-24 — End: 2021-03-18

## 2020-08-24 MED ORDER — ALLOPURINOL 100 MG PO TABS
100.0000 mg | ORAL_TABLET | Freq: Every day | ORAL | 3 refills | Status: DC
Start: 2020-08-24 — End: 2021-03-18

## 2020-08-24 NOTE — Telephone Encounter (Signed)
Please inform patient: Her uric acid level is greatly improved at 6.3.  Goal technically is 6 or less.  I believe we can stay on the one tab a day of allopurinol and it will control her gout.  If she ends up having additional gout flares or her uric acid rises then we can always consider increasing at that time.   If she would have acute flare she can take the colchicine. Colchicine 0.6 mg (1 tab) 3 times daily day 1, then 1 tab daily until flare resolved.   -I have sent a refill of the colchicine to the [facility] since hopefully this will not be used routinely.  I did tell them to hold prescription until patient request.  -I have refilled the allopurinol to her mail-in pharmacy since this will be a routine daily dose for her.   Next appt with this provider end of January for [facility]  Thanks.

## 2020-08-25 NOTE — Telephone Encounter (Signed)
Patient notified of changes and Mychart message sent for pt reference

## 2020-08-27 ENCOUNTER — Ambulatory Visit: Payer: Medicare Other | Admitting: Family Medicine

## 2020-08-31 ENCOUNTER — Telehealth: Payer: Self-pay

## 2020-08-31 NOTE — Telephone Encounter (Signed)
Receive fax for pt assistance program refill request. Placed papers on PCP desk for signature/approval.

## 2020-09-03 NOTE — Telephone Encounter (Signed)
Completed form and returned to [place] work basket. Please call pt to ask her if she is also requesting the needles and if so which one on the form is she is requesting. Please add if so and then fax/scan.   Thanks.

## 2020-09-21 ENCOUNTER — Telehealth: Payer: Self-pay

## 2020-09-21 NOTE — Telephone Encounter (Signed)
Patient is applying for financial assistance with [employer]. She states that they have faxed over paperwork that needs to be completed. Patient is almost out of insulin. She needs the form to be completed. Please call the patient back.

## 2020-09-22 ENCOUNTER — Ambulatory Visit (INDEPENDENT_AMBULATORY_CARE_PROVIDER_SITE_OTHER): Payer: Medicare Other | Admitting: Endocrinology

## 2020-09-22 ENCOUNTER — Other Ambulatory Visit: Payer: Self-pay

## 2020-09-22 ENCOUNTER — Encounter: Payer: Self-pay | Admitting: Endocrinology

## 2020-09-22 VITALS — BP 138/94 | HR 88 | Ht 67.0 in | Wt 282.0 lb

## 2020-09-22 DIAGNOSIS — E119 Type 2 diabetes mellitus without complications: Secondary | ICD-10-CM

## 2020-09-22 LAB — POCT GLYCOSYLATED HEMOGLOBIN (HGB A1C): Hemoglobin A1C: 8.3 % — AB (ref 4.0–5.6)

## 2020-09-22 MED ORDER — TRULICITY 0.75 MG/0.5ML ~~LOC~~ SOAJ: 0.7500 mg | SUBCUTANEOUS | 3 refills | Status: DC

## 2020-09-22 MED ORDER — BASAGLAR KWIKPEN 100 UNIT/ML ~~LOC~~ SOPN
45.0000 [IU] | PEN_INJECTOR | Freq: Two times a day (BID) | SUBCUTANEOUS | 3 refills | Status: DC
Start: 2020-09-22 — End: 2020-11-10

## 2020-09-22 NOTE — Telephone Encounter (Signed)
LVM for pt to CB regarding forms.

## 2020-09-22 NOTE — Progress Notes (Signed)
Subjective:    Patient ID: Ann [name], female    DOB: 22-May-1951, 69 y.o.   MRN: [id number]  HPI pt is referred by Dr Raoul Pitch, for diabetes.  Pt states DM was dx'ed in 2013; she is unaware of any chronic complications; she has been on insulin since soon after dx; pt says her diet is good, but exercise are limited by arthralgias; she has never had GDM (G0), pancreatitis, pancreatic surgery, severe hypoglycemia or DKA.  main symptom is difficulty losing weight.  She needs to lose weight, for TKR.  sdhe dies not tolerate glipizide (flatulence).  She did not tolerate Farxiga (vaginitis).  She says cbg varies from 58-400.  It is low in the middle of the night, but she does not check cbg often enough to tell about any other trend throughout the day Past Medical History:  Diagnosis Date  . Arthritis    osteoarthritis- knees.  . Atrial fibrillation ([place])    Dr. Marcello Moores status post cardioversion chronic anticoagulation  . Chicken pox   . Colon cancer ([place]) 2001   Radiation, chemo.  Adenocarcinoma of the colon.  Followed by Dr. Derrel Nip  . Complication of anesthesia    sensitive to narcotics"extra sleepy". Versed- "no memory recall for 4 days postop"  . Diabetes mellitus without complication ([facility])    oral and Insulin  . Hepatitis    "sub clinical case of Hepatitis in late 70's""has immunities"  . Hypertension   . Hypothyroidism   . Laceration of head 08/04/2019   8 cm laceration forehead/scalp after tripping when mowing her lawn.  . Melanoma ([place])    wide excision near scapula  . Mixed hyperlipidemia   . 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  . Ovarian teratoma, right 09/2000   Oophorectomy  . Sleep apnea    unable to tolreate cpap  . Wisdom teeth extracted     Past Surgical History:  Procedure Laterality Date  . ABDOMINAL HYSTERECTOMY  09/2000    Total hysterectomy and oophorectomy  . ABDOMINAL WOUND DEHISCENCE Bilateral    multiple times /  mesh use with multiple abdominal hernia repair  . CARDIOVERSION  2014   Unsuccessful.  Patient reports cardiac ablation was not felt to be appropriate next step by cardiology.  . CHOLECYSTECTOMY  04/1998   '98 -Lapraroscopic  . COLON RESECTION  2001   "colon cancer"  . FOOT SURGERY Bilateral    multiple "hammer toes and bunionectomies"  . KNEE ARTHROSCOPY Left 08/2006   meniscectomy  . MELANOMA EXCISION     near scapula  . TOTAL KNEE ARTHROPLASTY Right 01/24/2016   Procedure: RIGHT TOTAL KNEE ARTHROPLASTY;  Surgeon: Gaynelle Arabian, MD;  Location: [facility];  Service: Orthopedics;  Laterality: Right;    Social History   Socioeconomic History  . Marital status: Single    Spouse name: Not on file  . Number of children: Not on file  . Years of education: Not on file  . Highest education level: Not on file  Occupational History  . Not on file  Tobacco Use  . Smoking status: Former Smoker    Years: 25.00    Types: Cigarettes    Quit date: 01/17/1997    Years since quitting: 23.7  . Smokeless tobacco: Never Used  Vaping Use  . Vaping Use: Never used  Substance and Sexual Activity  . Alcohol use: No  . Drug use: No  . Sexual activity: Not Currently  Other Topics  Concern  . Not on file  Social History Narrative   Marital status/children/pets: Single. G0P0.   Education/employment: Retired Oceanographer:      -smoke alarm in the home:Yes     - wears seatbelt: Yes     - Feels safe in their relationships: Yes   Social Determinants of Health   Financial Resource Strain:   . Difficulty of Paying Living Expenses: Not on file  Food Insecurity:   . Worried About Charity fundraiser in the Last Year: Not on file  . Ran Out of Food in the Last Year: Not on file  Transportation Needs:   . Lack of Transportation (Medical): Not on file  . Lack of Transportation (Non-Medical): Not on file  Physical Activity:   . Days of Exercise per Week: Not on file  .  Minutes of Exercise per Session: Not on file  Stress:   . Feeling of Stress : Not on file  Social Connections:   . Frequency of Communication with Friends and Family: Not on file  . Frequency of Social Gatherings with Friends and Family: Not on file  . Attends Religious Services: Not on file  . Active Member of Clubs or Organizations: Not on file  . Attends Archivist Meetings: Not on file  . Marital Status: Not on file  Intimate Partner Violence:   . Fear of Current or Ex-Partner: Not on file  . Emotionally Abused: Not on file  . Physically Abused: Not on file  . Sexually Abused: Not on file    Current Outpatient Medications on File Prior to Visit  Medication Sig Dispense Refill  . allopurinol (ZYLOPRIM) 100 MG tablet Take 1 tablet (100 mg total) by mouth daily. 90 tablet 3  . ALPRAZolam (XANAX) 0.25 MG tablet Take 1 tablet (0.25 mg total) by mouth daily as needed for anxiety. 15 tablet 1  . Biotin 1000 MCG tablet Take 1,000 mcg by mouth daily.    . colchicine 0.6 MG tablet 0.6 mg (one tab) p.o. TID at gout onset, then one tab daily until flare is resolved (PRN) 30 tablet 0  . dabigatran (PRADAXA) 150 MG CAPS capsule Take 1 capsule (150 mg total) by mouth 2 (two) times daily. 60 capsule 3  . EPIPEN 2-PAK 0.3 MG/0.3ML SOAJ injection Inject 0.3 mg into the skin as needed. Bee stings  1  . fluticasone (FLONASE) 50 MCG/ACT nasal spray Place 2 sprays into both nostrils daily. (Patient taking differently: Place 2 sprays into both nostrils daily as needed. ) 16 g 6  . insulin aspart (NOVOLOG) 100 UNIT/ML injection Inject 20-24 Units into the skin 3 (three) times daily before meals. Sliding scale based on blood sugar. <200 20 units, 200-250 22 units, 250-300 24 units 30 mL 3  . levothyroxine (SYNTHROID) 50 MCG tablet Take 1 tablet (50 mcg total) by mouth daily before breakfast. 90 tablet 3  . metoprolol tartrate (LOPRESSOR) 50 MG tablet Take 1 tablet (50 mg total) by mouth 2 (two)  times daily. 180 tablet 1  . olmesartan-hydrochlorothiazide (BENICAR HCT) 40-25 MG tablet Take 1 tablet by mouth daily. 90 tablet 1  . TART CHERRY PO Take 1 tablet by mouth.    . Vitamin D, Cholecalciferol, 25 MCG (1000 UT) CAPS Take 2 capsules by mouth daily.     No current facility-administered medications on file prior to visit.    Allergies  Allergen Reactions  . Amoxicillin Shortness Of Breath, Swelling and Anaphylaxis  Has patient had a PCN reaction causing immediate rash, facial/tongue/throat swelling, SOB or lightheadedness with hypotension: Yes Has patient had a PCN reaction causing severe rash involving mucus membranes or skin necrosis: No Has patient had a PCN reaction that required hospitalization No Has patient had a PCN reaction occurring within the last 10 years: Yes If all of the above answers are "NO", then may proceed with Cephalosporin use.  Has patient had a PCN reaction causing immediate rash, facial/tongue/throat swelling, SOB or lightheadedness with hypotension: Yes Has patient had a PCN reaction causing severe rash involving mucus membranes or skin necrosis: No Has patient had a PCN reaction that required hospitalization No Has patient had a PCN reaction occurring within the last 10 years: Yes If all of the above answers are "NO", then may proceed with Cephalosporin use. Has patient had a PCN reaction causing immediate rash, facial/tongue/throat swelling, SOB or lightheadedness with hypotension: Yes Has patient had a PCN reaction causing severe rash involving mucus membranes or skin necrosis: No Has patient had a PCN reaction that required hospitalization No Has patient had a PCN reaction occurring within the last 10 years: Yes If all of the above answers are "NO", then may proceed with Cephalosporin use.   . Codeine Itching    Tolerates hydrocodone  . Ezetimibe   . Livalo [Pitavastatin]   . Statins Other (See Comments)    Muscle skeletal side effects  .  Welchol [Colesevelam]     Gi se    Family History  Problem Relation Age of Onset  . Arthritis Mother   . Colon cancer Mother   . Diabetes Mother   . Atrial fibrillation Mother   . Diabetes Father   . Heart attack Father   . Heart disease Father   . Stroke Father   . Hypertension Maternal Grandmother   . Stroke Maternal Grandmother   . Stroke Maternal Grandfather   . Hypertension Paternal Grandmother   . Stroke Paternal Grandmother   . Diabetes Paternal Grandfather   . Hypertension Paternal Grandfather   . Stroke Paternal Grandfather   . Diabetes Brother   . Brain cancer Brother   . Heart disease Brother   . Hypertension Brother   . Stroke Brother     BP (!) 138/94   Pulse 88   Ht 5\' 7"  (1.702 m)   Wt 282 lb (127.9 kg)   SpO2 97%   BMI 44.17 kg/m     Review of Systems denies weight change, blurry vision, chest pain, sob, n/v, urinary frequency, memory loss, and depression.     Objective:   Physical Exam VITAL SIGNS:  See vs page GENERAL: no distress Pulses: dorsalis pedis intact bilat.   MSK: no deformity of the feet.  CV: 1+ bilat leg edema, and bilat vv's.   Skin:  no ulcer on the feet, but there are bilat calluses.  normal color and temp on the feet.   Neuro: sensation is intact to touch on the feet.    Lab Results  Component Value Date   HGBA1C 8.3 (A) 09/22/2020   Lab Results  Component Value Date   CREATININE 1.09 08/23/2020   BUN 29 (H) 08/23/2020   NA 135 08/23/2020   K 3.9 08/23/2020   CL 98 08/23/2020   CO2 27 08/23/2020   I have reviewed outside records, and summarized: Pt was noted to have elevated A1c, and referred here.  Gout was well-controlled      Assessment & Plan:  Insulin-requiring type  2 DM: uncontrolled HTN: is noted today  Patient Instructions  Your blood pressure is high today.  Please see your primary care provider soon, to have it rechecked good diet and exercise significantly improve the control of your diabetes.   please let me know if you wish to be referred to a dietician.  high blood sugar is very risky to your health.  you should see an eye doctor and dentist every year.  It is very important to get all recommended vaccinations.  Controlling your blood pressure and cholesterol drastically reduces the damage diabetes does to your body.  Those who smoke should quit.  Please discuss these with your doctor.  check your blood sugar twice a day.  vary the time of day when you check, between before the 3 meals, and at bedtime.  also check if you have symptoms of your blood sugar being too high or too low.  please keep a record of the readings and bring it to your next appointment here (or you can bring the meter itself).  You can write it on any piece of paper.  please call us sooner if your blood sugar goes below 70, or if you have a lot of readings over 200.   We will need to take this complex situation in stages.  I have sent a prescription to your pharmacy, to add "Trulicity," and:  Stop taking the glipizide, and: Reduce the basaglar to 40 units twice a day, and:  Please continue the same Novolog.  Please come back for a follow-up appointment in 6 weeks.

## 2020-09-22 NOTE — Patient Instructions (Addendum)
Your blood pressure is high today.  Please see your primary care provider soon, to have it rechecked good diet and exercise significantly improve the control of your diabetes.  please let me know if you wish to be referred to a dietician.  high blood sugar is very risky to your health.  you should see an eye doctor and dentist every year.  It is very important to get all recommended vaccinations.  Controlling your blood pressure and cholesterol drastically reduces the damage diabetes does to your body.  Those who smoke should quit.  Please discuss these with your doctor.  check your blood sugar twice a day.  vary the time of day when you check, between before the 3 meals, and at bedtime.  also check if you have symptoms of your blood sugar being too high or too low.  please keep a record of the readings and bring it to your next appointment here (or you can bring the meter itself).  You can write it on any piece of paper.  please call us sooner if your blood sugar goes below 70, or if you have a lot of readings over 200.   We will need to take this complex situation in stages.  I have sent a prescription to your pharmacy, to add "Trulicity," and:  Stop taking the glipizide, and: Reduce the basaglar to 40 units twice a day, and:  Please continue the same Novolog.  Please come back for a follow-up appointment in 6 weeks.

## 2020-09-22 NOTE — Telephone Encounter (Signed)
Attempted to contact pt to find out about paperwork. We do not have paperwork as of 09/22/20

## 2020-09-23 NOTE — Telephone Encounter (Signed)
Patient returned call regarding Novolog financial assistance program paperwork.  [phone]

## 2020-09-23 NOTE — Telephone Encounter (Signed)
Forms scanned in chart from 10/05. Forms printed to be corrected with [facility]

## 2020-10-06 ENCOUNTER — Other Ambulatory Visit: Payer: Self-pay | Admitting: Family Medicine

## 2020-10-08 ENCOUNTER — Other Ambulatory Visit: Payer: Self-pay

## 2020-10-08 ENCOUNTER — Telehealth: Payer: Self-pay | Admitting: Family Medicine

## 2020-10-08 MED ORDER — DABIGATRAN ETEXILATE MESYLATE 150 MG PO CAPS
150.0000 mg | ORAL_CAPSULE | Freq: Two times a day (BID) | ORAL | 11 refills | Status: DC
Start: 2020-10-08 — End: 2022-01-09

## 2020-10-08 NOTE — Telephone Encounter (Signed)
Patient states she needs refill of Pradaxa sent to [facility], phone [phone] fax [phone]. [facility] told her they sent [place] a fax request on 09/23/20.

## 2020-10-08 NOTE — Telephone Encounter (Signed)
[facility] and they said all we need to do is send a Rx for medication because she is already established with them. Please advise.

## 2020-10-08 NOTE — Telephone Encounter (Signed)
Printed script

## 2020-10-08 NOTE — Telephone Encounter (Signed)
Faxed signed Rx and sent to scan

## 2020-10-18 ENCOUNTER — Other Ambulatory Visit: Payer: Self-pay | Admitting: Family Medicine

## 2020-10-22 ENCOUNTER — Telehealth: Payer: Self-pay

## 2020-10-22 NOTE — Telephone Encounter (Signed)
Spoke with pt and pt states she will not be able to take her insulin without needles. Pt states she cannot change the rules because the application is under the providers name and license. Pt was advise to try to ask the Endocrinologist and start the application with them in the meantime and if it doesn't work let us know. Pt states she will not ask Endo because its under another provider name.

## 2020-10-22 NOTE — Telephone Encounter (Signed)
Form on provider's desk.

## 2020-10-22 NOTE — Telephone Encounter (Signed)
Speak with pt and encourage her to have forms filled by Endo. Pt states she would like for me to reask provider to fill out form since application is under her name.

## 2020-10-22 NOTE — Telephone Encounter (Addendum)
Please call patient and clarify which needles of the 3 choices, she is in need of and the quantity.   We will fill this form out again so that she has her needles.   Any future forms for her diabetic meds will need to come from her endocrinologist since they are managing that condition.  Thanks

## 2020-10-22 NOTE — Telephone Encounter (Signed)
Verbally ask provider and she advise pt to follow with Endo

## 2020-10-22 NOTE — Telephone Encounter (Signed)
Patient is a now established with endocrinology for her diabetes.  She will need to have them complete her forms for diabetic meds and supplies.

## 2020-10-22 NOTE — Telephone Encounter (Signed)
Patient calling regarding box of needles she picked up and the quantity is incorrect.  Patient is saying it should have been a 90 d/s. Patient would like this to be resolved as soon as possible. Patient said that there should be new forms being faxed to our office to be completed so that she can receive the correct quantity. If there are any questions about how to fill out form, please call patient [phone].

## 2020-10-22 NOTE — Telephone Encounter (Signed)
Form on provider desk

## 2020-10-23 ENCOUNTER — Ambulatory Visit: Payer: Medicare Other

## 2020-10-25 NOTE — Telephone Encounter (Signed)
Spoke with pt regarding needles pt need Novofine 32 G

## 2020-10-25 NOTE — Telephone Encounter (Signed)
Form faxed

## 2020-11-01 NOTE — Telephone Encounter (Signed)
Form rec'd, signed and faxed,.

## 2020-11-08 ENCOUNTER — Encounter: Payer: Self-pay | Admitting: Endocrinology

## 2020-11-10 ENCOUNTER — Other Ambulatory Visit: Payer: Self-pay

## 2020-11-10 ENCOUNTER — Ambulatory Visit (INDEPENDENT_AMBULATORY_CARE_PROVIDER_SITE_OTHER): Payer: Medicare Other | Admitting: Endocrinology

## 2020-11-10 ENCOUNTER — Encounter: Payer: Self-pay | Admitting: Endocrinology

## 2020-11-10 VITALS — BP 122/68 | HR 68 | Ht 67.0 in | Wt 287.0 lb

## 2020-11-10 DIAGNOSIS — E119 Type 2 diabetes mellitus without complications: Secondary | ICD-10-CM | POA: Diagnosis not present

## 2020-11-10 LAB — POCT GLYCOSYLATED HEMOGLOBIN (HGB A1C): Hemoglobin A1C: 7.8 % — AB (ref 4.0–5.6)

## 2020-11-10 MED ORDER — BASAGLAR KWIKPEN 100 UNIT/ML ~~LOC~~ SOPN
38.0000 [IU] | PEN_INJECTOR | Freq: Two times a day (BID) | SUBCUTANEOUS | 3 refills | Status: DC
Start: 2020-11-10 — End: 2021-01-12

## 2020-11-10 MED ORDER — INSULIN ASPART 100 UNIT/ML ~~LOC~~ SOLN
25.0000 [IU] | Freq: Three times a day (TID) | SUBCUTANEOUS | 3 refills | Status: DC
Start: 2020-11-10 — End: 2021-01-12

## 2020-11-10 NOTE — Patient Instructions (Addendum)
check your blood sugar twice a day.  vary the time of day when you check, between before the 3 meals, and at bedtime.  also check if you have symptoms of your blood sugar being too high or too low.  please keep a record of the readings and bring it to your next appointment here (or you can bring the meter itself).  You can write it on any piece of paper.  please call us sooner if your blood sugar goes below 70, or if you have a lot of readings over 200.   Please continue the same Trulicity, and:  Reduce the Basaglar to 38 units twice a day, and:  Increase the Novolog to 25-29 units 3 times a day (just before each meal) Please come back for a follow-up appointment in 2 months.

## 2020-11-10 NOTE — Progress Notes (Signed)
Subjective:    Patient ID: Ann [name], female    DOB: December 12, 1950, 69 y.o.   MRN: [id number]  HPI Pt returns for f/u of diabetes mellitus: DM type: Insulin-requiring type 2 Dx'ed: 2542 Complications: PAD Therapy: insulin since soon after dx GDM: never (G0) DKA: never Severe hypoglycemia: never Pancreatitis: never Pancreatic imaging: neg on 2017 CT SDOH:  Other: she did not tolerate glipizide (flatulence), or Farxiga (vaginitis); She needs to lose weight, for TKR. Interval history: she brings her meter with her cbg's which I have reviewed today.  cbg varies from 89-314.  It is in general higher as the day goes on.  She seldom has hypoglycemia, and these episodes are mild.  She reports intermitt heartburn and diarrhea.  She says novolog is 20-24 units 3 times a day (just before each meal).   Past Medical History:  Diagnosis Date  . Arthritis    osteoarthritis- knees.  . Atrial fibrillation ([place])    Dr. Marcello Moores status post cardioversion chronic anticoagulation  . Chicken pox   . Colon cancer ([place]) 2001   Radiation, chemo.  Adenocarcinoma of the colon.  Followed by Dr. Derrel Nip  . Complication of anesthesia    sensitive to narcotics"extra sleepy". Versed- "no memory recall for 4 days postop"  . Diabetes mellitus without complication ([facility])    oral and Insulin  . Hepatitis    "sub clinical case of Hepatitis in late 70's""has immunities"  . Hypertension   . Hypothyroidism   . Laceration of head 08/04/2019   8 cm laceration forehead/scalp after tripping when mowing her lawn.  . Melanoma ([place])    wide excision near scapula  . Mixed hyperlipidemia   . 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  . Ovarian teratoma, right 09/2000   Oophorectomy  . Sleep apnea    unable to tolreate cpap  . Wisdom teeth extracted     Past Surgical History:  Procedure Laterality Date  . ABDOMINAL HYSTERECTOMY  09/2000    Total hysterectomy and oophorectomy  .  ABDOMINAL WOUND DEHISCENCE Bilateral    multiple times / mesh use with multiple abdominal hernia repair  . CARDIOVERSION  2014   Unsuccessful.  Patient reports cardiac ablation was not felt to be appropriate next step by cardiology.  . CHOLECYSTECTOMY  04/1998   '98 -Lapraroscopic  . COLON RESECTION  2001   "colon cancer"  . FOOT SURGERY Bilateral    multiple "hammer toes and bunionectomies"  . KNEE ARTHROSCOPY Left 08/2006   meniscectomy  . MELANOMA EXCISION     near scapula  . TOTAL KNEE ARTHROPLASTY Right 01/24/2016   Procedure: RIGHT TOTAL KNEE ARTHROPLASTY;  Surgeon: Gaynelle Arabian, MD;  Location: [facility];  Service: Orthopedics;  Laterality: Right;    Social History   Socioeconomic History  . Marital status: Single    Spouse name: Not on file  . Number of children: Not on file  . Years of education: Not on file  . Highest education level: Not on file  Occupational History  . Not on file  Tobacco Use  . Smoking status: Former Smoker    Years: 25.00    Types: Cigarettes    Quit date: 01/17/1997    Years since quitting: 23.8  . Smokeless tobacco: Never Used  Vaping Use  . Vaping Use: Never used  Substance and Sexual Activity  . Alcohol use: No  . Drug use: No  . Sexual activity: Not Currently  Other  Topics Concern  . Not on file  Social History Narrative   Marital status/children/pets: Single. G0P0.   Education/employment: Retired Oceanographer:      -smoke alarm in the home:Yes     - wears seatbelt: Yes     - Feels safe in their relationships: Yes   Social Determinants of Health   Financial Resource Strain:   . Difficulty of Paying Living Expenses: Not on file  Food Insecurity:   . Worried About Charity fundraiser in the Last Year: Not on file  . Ran Out of Food in the Last Year: Not on file  Transportation Needs:   . Lack of Transportation (Medical): Not on file  . Lack of Transportation (Non-Medical): Not on file  Physical  Activity:   . Days of Exercise per Week: Not on file  . Minutes of Exercise per Session: Not on file  Stress:   . Feeling of Stress : Not on file  Social Connections:   . Frequency of Communication with Friends and Family: Not on file  . Frequency of Social Gatherings with Friends and Family: Not on file  . Attends Religious Services: Not on file  . Active Member of Clubs or Organizations: Not on file  . Attends Archivist Meetings: Not on file  . Marital Status: Not on file  Intimate Partner Violence:   . Fear of Current or Ex-Partner: Not on file  . Emotionally Abused: Not on file  . Physically Abused: Not on file  . Sexually Abused: Not on file    Current Outpatient Medications on File Prior to Visit  Medication Sig Dispense Refill  . allopurinol (ZYLOPRIM) 100 MG tablet Take 1 tablet (100 mg total) by mouth daily. 90 tablet 3  . ALPRAZolam (XANAX) 0.25 MG tablet Take 1 tablet (0.25 mg total) by mouth daily as needed for anxiety. 15 tablet 1  . Biotin 1000 MCG tablet Take 1,000 mcg by mouth daily.    . colchicine 0.6 MG tablet 0.6 mg (one tab) p.o. TID at gout onset, then one tab daily until flare is resolved (PRN) 30 tablet 0  . dabigatran (PRADAXA) 150 MG CAPS capsule Take 1 capsule (150 mg total) by mouth 2 (two) times daily. 60 capsule 11  . Dulaglutide (TRULICITY) 2.68 TM/1.9QQ SOPN Inject 0.75 mg into the skin once a week. 6 mL 3  . EPIPEN 2-PAK 0.3 MG/0.3ML SOAJ injection Inject 0.3 mg into the skin as needed. Bee stings  1  . fluticasone (FLONASE) 50 MCG/ACT nasal spray Place 2 sprays into both nostrils daily. (Patient taking differently: Place 2 sprays into both nostrils daily as needed. ) 16 g 6  . levothyroxine (SYNTHROID) 50 MCG tablet Take 1 tablet (50 mcg total) by mouth daily before breakfast. 90 tablet 3  . metoprolol tartrate (LOPRESSOR) 50 MG tablet Take 1 tablet (50 mg total) by mouth 2 (two) times daily. 180 tablet 1  . olmesartan-hydrochlorothiazide  (BENICAR HCT) 40-25 MG tablet Take 1 tablet by mouth daily. 90 tablet 1  . TART CHERRY PO Take 1 tablet by mouth.    . Vitamin D, Cholecalciferol, 25 MCG (1000 UT) CAPS Take 2 capsules by mouth daily.     No current facility-administered medications on file prior to visit.    Allergies  Allergen Reactions  . Amoxicillin Shortness Of Breath, Swelling and Anaphylaxis    Has patient had a PCN reaction causing immediate rash, facial/tongue/throat swelling, SOB or lightheadedness with hypotension:  Yes Has patient had a PCN reaction causing severe rash involving mucus membranes or skin necrosis: No Has patient had a PCN reaction that required hospitalization No Has patient had a PCN reaction occurring within the last 10 years: Yes If all of the above answers are "NO", then may proceed with Cephalosporin use.  Has patient had a PCN reaction causing immediate rash, facial/tongue/throat swelling, SOB or lightheadedness with hypotension: Yes Has patient had a PCN reaction causing severe rash involving mucus membranes or skin necrosis: No Has patient had a PCN reaction that required hospitalization No Has patient had a PCN reaction occurring within the last 10 years: Yes If all of the above answers are "NO", then may proceed with Cephalosporin use. Has patient had a PCN reaction causing immediate rash, facial/tongue/throat swelling, SOB or lightheadedness with hypotension: Yes Has patient had a PCN reaction causing severe rash involving mucus membranes or skin necrosis: No Has patient had a PCN reaction that required hospitalization No Has patient had a PCN reaction occurring within the last 10 years: Yes If all of the above answers are "NO", then may proceed with Cephalosporin use.   . Codeine Itching    Tolerates hydrocodone  . Ezetimibe   . Livalo [Pitavastatin]   . Statins Other (See Comments)    Muscle skeletal side effects  . Welchol [Colesevelam]     Gi se    Family History  Problem  Relation Age of Onset  . Arthritis Mother   . Colon cancer Mother   . Diabetes Mother   . Atrial fibrillation Mother   . Diabetes Father   . Heart attack Father   . Heart disease Father   . Stroke Father   . Hypertension Maternal Grandmother   . Stroke Maternal Grandmother   . Stroke Maternal Grandfather   . Hypertension Paternal Grandmother   . Stroke Paternal Grandmother   . Diabetes Paternal Grandfather   . Hypertension Paternal Grandfather   . Stroke Paternal Grandfather   . Diabetes Brother   . Brain cancer Brother   . Heart disease Brother   . Hypertension Brother   . Stroke Brother     BP 122/68   Pulse 68   Ht 5\' 7"  (1.702 m)   Wt 287 lb (130.2 kg)   SpO2 98%   BMI 44.95 kg/m    Review of Systems Denies LOC    Objective:   Physical Exam VITAL SIGNS:  See vs page GENERAL: no distress Pulses: dorsalis pedis intact bilat.   MSK: no deformity of the feet.  CV: 1+ bilat leg edema, and bilat vv's.   Skin:  no ulcer on the feet, but there are bilat calluses.  normal color and temp on the feet.   Neuro: sensation is intact to touch on the feet.   Lab Results  Component Value Date   HGBA1C 7.8 (A) 11/10/2020        Assessment & Plan:  Insulin-requiring type 2 DM, with PAD: uncontrolled Heartburn, due to Trulicity: we can't increase it, at least for now.   Patient Instructions  check your blood sugar twice a day.  vary the time of day when you check, between before the 3 meals, and at bedtime.  also check if you have symptoms of your blood sugar being too high or too low.  please keep a record of the readings and bring it to your next appointment here (or you can bring the meter itself).  You can write it on any  piece of paper.  please call us sooner if your blood sugar goes below 70, or if you have a lot of readings over 200.   Please continue the same Trulicity, and:  Reduce the Basaglar to 38 units twice a day, and:  Increase the Novolog to 25-29 units 3  times a day (just before each meal) Please come back for a follow-up appointment in 2 months.

## 2020-11-11 ENCOUNTER — Telehealth: Payer: Self-pay | Admitting: *Deleted

## 2020-11-11 ENCOUNTER — Telehealth: Payer: Self-pay | Admitting: Family Medicine

## 2020-11-11 NOTE — Telephone Encounter (Signed)
Patient dropped off Patient Assistance Program Application form for Dr. Raoul Pitch to fill out. Placed form in Dr. Lucita Lora inbox in front office.

## 2020-11-11 NOTE — Telephone Encounter (Signed)
SENT/FAXED Novo nordisk [employer] [phone]

## 2020-11-12 NOTE — Telephone Encounter (Signed)
Form placed on desk please advise.

## 2020-11-12 NOTE — Telephone Encounter (Signed)
Completed form for her. Please charge pt for form completion $10.00. Placed in [employer] work Copy

## 2020-11-12 NOTE — Telephone Encounter (Signed)
Called patient regarding form completion and $10 fee. Patient did not agree with the fee and refused to pay, so I let her know we would keep this form on file until she is ready. Patient stated this is unfair and we should not have charged her. I explained to the patient that Dr. Raoul Pitch has previously filled out paperwork several times for her and did not charge any of those times. Patient then claimed that every previous form completion was done incorrectly by Dr. Raoul Pitch and/or our office staff and so they should not count. Even though that is unlikely, I did not argue the point with her, instead I explained that every time a form is completed by a physician, there should be a charge and so this fee is still appropriate. The patient said she would just pick up a copy and fax it herself and so I explained that we cannot release the original or a copy until the fee is paid. Patient then said she would call the medication company and tell them Dr. Raoul Pitch refused to fill out the paperwork. I told her that is incorrect, that Dr. Raoul Pitch had filled out the form and it is ready to be picked up, with a $10 fee. She next stated that if she can't get her medication and "strokes out" that there would be a problem. I told her I do not want to see that happen and the form is ready to be picked up or faxed, after the $10 fee. She stated it was Dr. Lucita Lora choice to charge her and she refuses to pay. Patient asked to speak with office manager, however Estill Bamberg is out of the office today. I told the patient that I would relay this message to management. Form placed in form pick up folder in front office.

## 2020-11-12 NOTE — Telephone Encounter (Signed)
Form completed. And given to front desk please fax once pays. Thanks.

## 2020-11-15 NOTE — Telephone Encounter (Signed)
At the request of Rober Minion, Director Physician Services, completed form faxed to [employer] at [phone]. Original copy left at front desk for patient pick up.

## 2020-11-17 ENCOUNTER — Other Ambulatory Visit: Payer: Self-pay | Admitting: Family Medicine

## 2020-11-17 ENCOUNTER — Telehealth: Payer: Self-pay | Admitting: *Deleted

## 2020-11-17 NOTE — Telephone Encounter (Signed)
If I received it, I did it and put it in the inbox

## 2020-11-17 NOTE — Telephone Encounter (Signed)
Patient called stating Ann [name] Forms have not received Dr Cordelia Pen part on her patient assistance program. Please advise [phone]

## 2020-11-17 NOTE — Telephone Encounter (Signed)
Patient requested to leave a detailed message if she don't answer

## 2020-11-17 NOTE — Telephone Encounter (Signed)
Called patient, LVM just wanting to know if she dropped off paper work, or if they were faxing to [place]- advised patient I would be on the look out for the paperwork, and be glad to fill it out and send it back.  Left call back number to return call.

## 2020-11-17 NOTE — Telephone Encounter (Signed)
Called patient, advised that I had completed the papers for both medications, and faxed to [facility] today.

## 2020-11-17 NOTE — Telephone Encounter (Signed)
Called patient, I did not have the paperwork- so I will print off page 5 and 6 of the provider portion of the application for basaglar and trulicity and fax it to [employer] today.  Patient verbalized understanding.

## 2020-11-17 NOTE — Telephone Encounter (Signed)
Are you aware of this paperwork?  Thanks!

## 2020-11-17 NOTE — Telephone Encounter (Signed)
Patient called back stating Dr Loanne Drilling has the paperwork, she handed it to him while she was here.

## 2020-11-22 ENCOUNTER — Telehealth: Payer: Self-pay | Admitting: Endocrinology

## 2020-11-22 ENCOUNTER — Telehealth: Payer: Self-pay

## 2020-11-22 NOTE — Telephone Encounter (Signed)
Called patient- advised I had faxed 12/08 I received confirmation that it had went through- but I would refax today, and patient agreed with this. She will call them in a day or two and check to see if they received it- if not she will come pick up the paperwork here and use her fax machine at home to try their- I advised that was fine.   Patient verbalized understanding, and thankful for the attempts.

## 2020-11-22 NOTE — Telephone Encounter (Signed)
Patient called re: Ann [name] Cares Forms have not been received by [employer] to date for Patient's Trulicity and Engineer, agricultural. Patient states Almyra Free left a message on Patient's phone last week that all of the applications have been completed and sent to each Company ([place]).Patient requests to be contacted at ph# [phone] to be given status of the above.

## 2020-11-22 NOTE — Telephone Encounter (Signed)
Patient refill request  allopurinol (ZYLOPRIM) 100 MG tablet [[id number]]    [facility]

## 2020-11-22 NOTE — Telephone Encounter (Signed)
Spoke with and informed her that it was sent to [place]

## 2020-11-25 NOTE — Telephone Encounter (Signed)
Faxed received from [employer] stating that the reported income exceed current program eligibility limits and this decision is final.  If there are any questions, please call [phone].

## 2020-11-26 NOTE — Telephone Encounter (Signed)
My chart message sent to pt.

## 2020-11-26 NOTE — Telephone Encounter (Signed)
Received refusal letter.  I am not certain if they also informed patient of the refusal.  Could we please make sure she is aware.  May send my chart message.  Thanks

## 2020-12-14 ENCOUNTER — Telehealth: Payer: Self-pay | Admitting: Endocrinology

## 2020-12-14 NOTE — Telephone Encounter (Signed)
Patient dropped additional patient assistance paperwork yesterday. It was placed in Dr George Hugh box up front.  Patient is returning call to see if the paperwork has been received and if any clarification can be given regarding.  Call back number is [phone].  Patient is wanting to pick up paperwork and also has given [place] an URGENT fax number for [place] to send the paperwork to now.  This has bee on going since early December 2021

## 2020-12-15 NOTE — Telephone Encounter (Signed)
Pt came to pick-up copy complete form with fax confirmation.

## 2020-12-15 NOTE — Telephone Encounter (Signed)
Patient requests to be called asap at ph# [phone] asap re: Paperwork dropped off at last visit in November for Novo Nordisk-Patient states if this is not done immediately she may lose the ability to receive assistance (Paperwork was supposed be turned into [employer] by the end of December 2021).

## 2020-12-15 NOTE — Telephone Encounter (Signed)
Notified pt faxed Novo nordisk on the 11/11/20. Pt requesting a copy of the form. Refaxed the forms to new number [phone]

## 2020-12-20 ENCOUNTER — Telehealth: Payer: Self-pay | Admitting: *Deleted

## 2020-12-20 ENCOUNTER — Telehealth: Payer: Self-pay | Admitting: Family Medicine

## 2020-12-20 MED ORDER — OLMESARTAN MEDOXOMIL-HCTZ 40-25 MG PO TABS
1.0000 | ORAL_TABLET | Freq: Every day | ORAL | 0 refills | Status: DC
Start: 2020-12-20 — End: 2021-03-21

## 2020-12-20 MED ORDER — METOPROLOL TARTRATE 50 MG PO TABS
50.0000 mg | ORAL_TABLET | Freq: Two times a day (BID) | ORAL | 0 refills | Status: DC
Start: 2020-12-20 — End: 2021-03-21

## 2020-12-20 MED ORDER — LEVOTHYROXINE SODIUM 50 MCG PO TABS
50.0000 ug | ORAL_TABLET | Freq: Every day | ORAL | 0 refills | Status: DC
Start: 2020-12-20 — End: 2021-03-21

## 2020-12-20 NOTE — Telephone Encounter (Signed)
Patient states she called [facility]) for refills and they told her they need new prescriptions sent. Their phone number is [phone] and her policy# [id number]. Please send new RXs for these medications: Pradaxa Metoprolol Levothyroxine Olmesartan/HCTZ

## 2020-12-20 NOTE — Telephone Encounter (Signed)
appropriate meds sent for 30d supply.    Please sched pt for [facility]

## 2020-12-20 NOTE — Telephone Encounter (Signed)
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

## 2020-12-20 NOTE — Telephone Encounter (Signed)
Patient called back to inquire about her refill status. She states she called in 4 refills and has received notification from [employer] that her [place] was not called in.  Please call patient [phone].  Thank you.

## 2020-12-21 NOTE — Telephone Encounter (Signed)
LVM for pt to CB regarding rx question.  Note:  Rx was not approved due to refill request being too soon

## 2020-12-21 NOTE — Telephone Encounter (Signed)
Patient returning call; Rx was not approved due to refill request being too soon  Patient will call back after she speaks with [employer].   Note:  Patient due for 3 month follow up. Please schedule.

## 2020-12-30 ENCOUNTER — Ambulatory Visit: Payer: Medicare Other | Admitting: Family

## 2020-12-30 ENCOUNTER — Other Ambulatory Visit: Payer: Self-pay

## 2020-12-30 ENCOUNTER — Ambulatory Visit (INDEPENDENT_AMBULATORY_CARE_PROVIDER_SITE_OTHER): Payer: Medicare Other | Admitting: Family Medicine

## 2020-12-30 ENCOUNTER — Encounter: Payer: Self-pay | Admitting: Family Medicine

## 2020-12-30 VITALS — BP 112/80 | HR 81 | Temp 98.5°F | Resp 18 | Ht 67.0 in | Wt 282.4 lb

## 2020-12-30 DIAGNOSIS — L03115 Cellulitis of right lower limb: Secondary | ICD-10-CM | POA: Diagnosis not present

## 2020-12-30 HISTORY — DX: Cellulitis of right lower limb: L03.115

## 2020-12-30 MED ORDER — DOXYCYCLINE HYCLATE 100 MG PO TABS: 100.0000 mg | ORAL_TABLET | Freq: Two times a day (BID) | ORAL | 0 refills | Status: DC

## 2020-12-30 NOTE — Assessment & Plan Note (Signed)
tx with abx and f/u pcp I wanted to get [place] due to calf pain and redness but pt refused --- she is on pradaxa and did not believe it was a clot We discussed that a clot was still possible but she did not want to do [place]  Pt encouraged to go to er / f/u pcp if symptoms did not improve

## 2020-12-30 NOTE — Patient Instructions (Addendum)

## 2020-12-30 NOTE — Progress Notes (Signed)
Patient ID: Ann [name], female    DOB: July 05, 1951  Age: 70 y.o. MRN: [id number]    Subjective:  Subjective  HPI [name] presents for ? Cellulitis R low leg.   She thinks she may have been bit by something.  + itching , pain , redness x 2 days   Review of Systems  Constitutional: Negative for appetite change, diaphoresis, fatigue and unexpected weight change.  Eyes: Negative for pain, redness and visual disturbance.  Respiratory: Negative for cough, chest tightness, shortness of breath and wheezing.   Cardiovascular: Negative for chest pain, palpitations and leg swelling.  Endocrine: Negative for cold intolerance, heat intolerance, polydipsia, polyphagia and polyuria.  Genitourinary: Negative for difficulty urinating, dysuria and frequency.  Musculoskeletal: Positive for joint swelling.  Skin: Positive for color change and rash.  Neurological: Negative for dizziness, light-headedness, numbness and headaches.    History Past Medical History:  Diagnosis Date  . Arthritis    osteoarthritis- knees.  . Atrial fibrillation ([place])    Dr. Marcello Moores status post cardioversion chronic anticoagulation  . Chicken pox   . Colon cancer ([place]) 2001   Radiation, chemo.  Adenocarcinoma of the colon.  Followed by Dr. Derrel Nip  . Complication of anesthesia    sensitive to narcotics"extra sleepy". Versed- "no memory recall for 4 days postop"  . Diabetes mellitus without complication (HCC)    oral and Insulin  . Hepatitis    "sub clinical case of Hepatitis in late 70's""has immunities"  . Hypertension   . Hypothyroidism   . Laceration of head 08/04/2019   8 cm laceration forehead/scalp after tripping when mowing her lawn.  . Melanoma ([place])    wide excision near scapula  . Mixed hyperlipidemia   . 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  . Ovarian teratoma, right 09/2000   Oophorectomy  . Sleep apnea    unable to tolreate cpap  . Wisdom teeth  extracted     She has a past surgical history that includes Abdominal hysterectomy (09/2000); Cholecystectomy (04/1998); Foot surgery (Bilateral); Knee arthroscopy (Left, 08/2006); Abdominal wound dehiscence (Bilateral); Total knee arthroplasty (Right, 01/24/2016); Colon resection (2001); Melanoma excision; and Cardioversion (2014).   Her family history includes Arthritis in her mother; Atrial fibrillation in her mother; Brain cancer in her brother; Colon cancer in her mother; Diabetes in her brother, father, mother, and paternal grandfather; Heart attack in her father; Heart disease in her brother and father; Hypertension in her brother, maternal grandmother, paternal grandfather, and paternal grandmother; Stroke in her brother, father, maternal grandfather, maternal grandmother, paternal grandfather, and paternal grandmother.She reports that she quit smoking about 23 years ago. Her smoking use included cigarettes. She quit after 25.00 years of use. She has never used smokeless tobacco. She reports that she does not drink alcohol and does not use drugs.  Current Outpatient Medications on File Prior to Visit  Medication Sig Dispense Refill  . allopurinol (ZYLOPRIM) 100 MG tablet Take 1 tablet (100 mg total) by mouth daily. 90 tablet 3  . ALPRAZolam (XANAX) 0.25 MG tablet Take 1 tablet (0.25 mg total) by mouth daily as needed for anxiety. 15 tablet 1  . Biotin 1000 MCG tablet Take 1,000 mcg by mouth daily.    . colchicine 0.6 MG tablet 0.6 mg (one tab) p.o. TID at gout onset, then one tab daily until flare is resolved (PRN) 30 tablet 0  . dabigatran (PRADAXA) 150 MG CAPS capsule Take 1 capsule (150 mg  total) by mouth 2 (two) times daily. 60 capsule 11  . Dulaglutide (TRULICITY) A999333 0000000 SOPN Inject 0.75 mg into the skin once a week. 6 mL 3  . EPIPEN 2-PAK 0.3 MG/0.3ML SOAJ injection Inject 0.3 mg into the skin as needed. Bee stings  1  . fluticasone (FLONASE) 50 MCG/ACT nasal spray Place 2 sprays  into both nostrils daily. (Patient taking differently: Place 2 sprays into both nostrils daily as needed.) 16 g 6  . insulin aspart (NOVOLOG) 100 UNIT/ML injection Inject 25-29 Units into the skin 3 (three) times daily with meals. 30 mL 3  . Insulin Glargine (BASAGLAR KWIKPEN) 100 UNIT/ML Inject 38 Units into the skin 2 (two) times daily. 45 mL 3  . levothyroxine (SYNTHROID) 50 MCG tablet Take 1 tablet (50 mcg total) by mouth daily before breakfast. 30 tablet 0  . metoprolol tartrate (LOPRESSOR) 50 MG tablet Take 1 tablet (50 mg total) by mouth 2 (two) times daily. 30 tablet 0  . olmesartan-hydrochlorothiazide (BENICAR HCT) 40-25 MG tablet Take 1 tablet by mouth daily. 30 tablet 0  . TART CHERRY PO Take 1 tablet by mouth.    . Vitamin D, Cholecalciferol, 25 MCG (1000 UT) CAPS Take 2 capsules by mouth daily.     No current facility-administered medications on file prior to visit.     Objective:  Objective  Physical Exam Vitals and nursing note reviewed.  Constitutional:      Appearance: She is well-developed and well-nourished.  HENT:     Head: Normocephalic and atraumatic.  Eyes:     Extraocular Movements: EOM normal.     Conjunctiva/sclera: Conjunctivae normal.  Neck:     Thyroid: No thyromegaly.     Vascular: No JVD.  Cardiovascular:     Rate and Rhythm: Normal rate and regular rhythm.     Heart sounds: Normal heart sounds. No murmur heard.   Pulmonary:     Effort: Pulmonary effort is normal. No respiratory distress.     Breath sounds: Normal breath sounds. No wheezing or rales.  Chest:     Chest wall: No tenderness.  Musculoskeletal:        General: Swelling and tenderness present. No edema.     Right lower leg: Swelling and tenderness present.     Left lower leg: Normal.  Skin:    Findings: Erythema and rash present. Rash is papular.          Comments: + errythema R inner calf + calf tenderness and ropiness  Neurological:     Mental Status: She is alert and oriented  to person, place, and time.  Psychiatric:        Mood and Affect: Mood and affect normal.    BP 112/80 (BP Location: Right Arm, Patient Position: Sitting, Cuff Size: Large)   Pulse 81   Temp 98.5 F (36.9 C) (Oral)   Resp 18   Ht 5\' 7"  (1.702 m)   Wt 282 lb 6.4 oz (128.1 kg)   SpO2 97%   BMI 44.23 kg/m  Wt Readings from Last 3 Encounters:  12/30/20 282 lb 6.4 oz (128.1 kg)  11/10/20 287 lb (130.2 kg)  09/22/20 282 lb (127.9 kg)     Lab Results  Component Value Date   WBC 8.7 04/20/2020   HGB 14.8 04/20/2020   HCT 43.8 04/20/2020   PLT 141.0 (L) 04/20/2020   GLUCOSE 199 (H) 08/23/2020   CHOL 253 (H) 04/20/2020   TRIG 236.0 (H) 04/20/2020   HDL 39.10  04/20/2020   LDLDIRECT 163.0 04/20/2020   LDLCALC 160 04/02/2019   ALT 16 04/20/2020   AST 16 04/20/2020   NA 135 08/23/2020   K 3.9 08/23/2020   CL 98 08/23/2020   CREATININE 1.09 08/23/2020   BUN 29 (H) 08/23/2020   CO2 27 08/23/2020   TSH 2.54 04/20/2020   INR 1.11 01/18/2016   HGBA1C 7.8 (A) 11/10/2020    CT ABDOMEN PELVIS W CONTRAST  Result Date: 07/21/2016 CLINICAL DATA:  70 year old female with right side abdominal pain and bloating for 7 months. Initial encounter. Personal history of colon cancer treated with surgery and chemotherapy. EXAM: CT ABDOMEN AND PELVIS WITH CONTRAST TECHNIQUE: Multidetector CT imaging of the abdomen and pelvis was performed using the standard protocol following bolus administration of intravenous contrast. CONTRAST:  17mL ISOVUE-300 IOPAMIDOL (ISOVUE-300) INJECTION 61% COMPARISON:  Report of CT Abdomen and Pelvis 09/26/2000 (no images available). [facility] lumbar MRI 12/08/2008 FINDINGS: Mild cardiomegaly. No pericardial or pleural effusion. Negative lung bases. Stable vertebral height and alignment. Degenerative changes throughout the spine. No acute or suspicious osseous lesion identified. Surgically absent uterus. Ovaries diminutive or absent. No pelvic free  fluid. Unremarkable urinary bladder. Negative rectum. Moderate to severe sigmoid diverticulosis. No active inflammation identified. Diverticulosis continues into the distal descending colon, no active inflammation. Negative more proximal left colon and splenic flexure. Negative transverse colon aside from retained stool. Negative right colon. Evidence of prior appendectomy. Negative terminal ileum. No dilated small bowel. Negative stomach. Negative duodenum aside from a a 4.3 cm duodenum diverticulum. No abdominal free air or free fluid. Surgically absent gallbladder. Decreased density throughout the liver compatible with steatosis. No discrete liver lesion is evident. Negative spleen. There are multiple surgical clips in the gastrosplenic ligament and anterior to the spleen which might be related to previous colon cancer therapy in this setting. Negative pancreas and adrenal glands. Aortoiliac calcified atherosclerosis noted. Major arterial structures are patent. Portal venous system appears patent on delayed images. Bilateral renal enhancement and contrast excretion within normal limits. No lymphadenopathy. No abdominal hernia identified. IMPRESSION: 1. No acute com inflammatory, or metastatic process identified in the abdomen or pelvis. 2. Four cm duodenum diverticulum. Often duodenum diverticula are incidental, although they can be associated with abdominal symptoms secondary to bacterial overgrowth. 3. Diverticulosis of the distal colon with no active inflammation identified. 4. Hepatic steatosis. 5.  Calcified aortic atherosclerosis. Electronically Signed   By: Genevie Ann M.D.   On: 07/21/2016 15:25     Assessment & Plan:  Plan  I am having Yancey Flemings start on doxycycline. I am also having her maintain her EpiPen 2-Pak, Vitamin D (Cholecalciferol), ALPRAZolam, fluticasone, TART CHERRY PO, Biotin, allopurinol, colchicine, Trulicity, dabigatran, Basaglar KwikPen, insulin aspart, metoprolol tartrate,  levothyroxine, and olmesartan-hydrochlorothiazide.  Meds ordered this encounter  Medications  . doxycycline (VIBRA-TABS) 100 MG tablet    Sig: Take 1 tablet (100 mg total) by mouth 2 (two) times daily.    Dispense:  20 tablet    Refill:  0    Problem List Items Addressed This Visit   None   Visit Diagnoses    Cellulitis of right lower extremity    -  Primary   Relevant Medications   doxycycline (VIBRA-TABS) 100 MG tablet      Follow-up: Return if symptoms worsen or fail to improve.  Ann Held, DO

## 2021-01-06 ENCOUNTER — Telehealth: Payer: Self-pay | Admitting: Endocrinology

## 2021-01-06 NOTE — Telephone Encounter (Signed)
Pt called stating she called her insulin [place] and they told her we received the shipment of her medication on 1/14 and signed for but I checked and relayed to her we have not received the medication yet and will notify her whenever we receive it. Pt was addiment that we do in fact have it and wanted to speak to [facility]'s nurse. MS#[phone]

## 2021-01-07 ENCOUNTER — Telehealth: Payer: Self-pay | Admitting: *Deleted

## 2021-01-07 NOTE — Telephone Encounter (Signed)
Best I can offer is to advise resending the package

## 2021-01-07 NOTE — Telephone Encounter (Signed)
resent the [employer] form--[phone]. Pt mention have enough medication until the end of feb.

## 2021-01-07 NOTE — Telephone Encounter (Signed)
Pt called--stated called yesterday, regarding insulin--not arrived in the office. Pt stated called [employer]--stated should already arrived 14th and [employer]-claim someone sign y the name Oswaldo Conroy person does not work in this office. Pt stated called [employer] to resend the insulin. Please advise

## 2021-01-10 ENCOUNTER — Encounter: Payer: Self-pay | Admitting: Endocrinology

## 2021-01-11 ENCOUNTER — Telehealth: Payer: Self-pay | Admitting: *Deleted

## 2021-01-11 NOTE — Telephone Encounter (Signed)
Notified pt --[employer] program-- Novolog Flexpen  5x3 ml prefilled  ready to be pick-up front desk (located back [place]). Told pt manager found the order slip and match with medication. 7 Box.

## 2021-01-12 ENCOUNTER — Other Ambulatory Visit: Payer: Self-pay

## 2021-01-12 ENCOUNTER — Ambulatory Visit (INDEPENDENT_AMBULATORY_CARE_PROVIDER_SITE_OTHER): Payer: Medicare Other | Admitting: Endocrinology

## 2021-01-12 ENCOUNTER — Telehealth: Payer: Self-pay | Admitting: *Deleted

## 2021-01-12 VITALS — BP 140/90 | HR 86 | Ht 68.0 in | Wt 285.8 lb

## 2021-01-12 DIAGNOSIS — E119 Type 2 diabetes mellitus without complications: Secondary | ICD-10-CM | POA: Diagnosis not present

## 2021-01-12 LAB — POCT GLYCOSYLATED HEMOGLOBIN (HGB A1C): Hemoglobin A1C: 7.6 % — AB (ref 4.0–5.6)

## 2021-01-12 MED ORDER — INSULIN ASPART 100 UNIT/ML ~~LOC~~ SOLN: 28.0000 [IU] | Freq: Three times a day (TID) | SUBCUTANEOUS | 3 refills | Status: DC

## 2021-01-12 MED ORDER — BASAGLAR KWIKPEN 100 UNIT/ML ~~LOC~~ SOPN
35.0000 [IU] | PEN_INJECTOR | Freq: Two times a day (BID) | SUBCUTANEOUS | 3 refills | Status: DC
Start: 2021-01-12 — End: 2021-03-17

## 2021-01-12 NOTE — Patient Instructions (Addendum)
check your blood sugar twice a day.  vary the time of day when you check, between before the 3 meals, and at bedtime.  also check if you have symptoms of your blood sugar being too high or too low.  please keep a record of the readings and bring it to your next appointment here (or you can bring the meter itself).  You can write it on any piece of paper.  please call us sooner if your blood sugar goes below 70, or if you have a lot of readings over 200.   Please continue the same Trulicity, and:  Reduce the Basaglar to 35 units twice a day, and:  Increase the Novolog to 28-30 units 3 times a day (just before each meal).   Please come back for a follow-up appointment in 2 months.

## 2021-01-12 NOTE — Telephone Encounter (Signed)
Pt have an appt today at the office and given to pt  [employer] program-- Novolog Flexpen  5x3 ml prefilled (7 box).

## 2021-01-12 NOTE — Progress Notes (Signed)
Subjective:    Patient ID: Ann [name], female    DOB: 12/29/1950, 70 y.o.   MRN: [id number]  HPI Pt returns for f/u of diabetes mellitus: DM type: Insulin-requiring type 2 Dx'ed: 2013 Complications: PAD Therapy: insulin since soon after dx, and Trulicity GDM: never (G0) DKA: never Severe hypoglycemia: never Pancreatitis: never Pancreatic imaging: neg on 2017 CT SDOH: she gets meds from pt assist.   Other: she did not tolerate glipizide (flatulence), or Farxiga (vaginitis); She needs to lose weight, for TKR.  Interval history: no cbg record, but states cbg varies from 58-210.  It is in general higher as the day goes on.  She seldom has hypoglycemia, and these episodes are mild. This happens in the middle of the night.   Past Medical History:  Diagnosis Date  . Arthritis    osteoarthritis- knees.  . Atrial fibrillation ([facility])    Dr. Maisie Fushomas status post cardioversion chronic anticoagulation  . Chicken pox   . Colon cancer ([facility]) 2001   Radiation, chemo.  Adenocarcinoma of the colon.  Followed by Dr. Kennith CenterHines  . Complication of anesthesia    sensitive to narcotics"extra sleepy". Versed- "no memory recall for 4 days postop"  . Diabetes mellitus without complication (HCC)    oral and Insulin  . Hepatitis    "sub clinical case of Hepatitis in late 70's""has immunities"  . Hypertension   . Hypothyroidism   . Laceration of head 08/04/2019   8 cm laceration forehead/scalp after tripping when mowing her lawn.  . Melanoma ([facility])    wide excision near scapula  . Mixed hyperlipidemia   . 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  . Ovarian teratoma, right 09/2000   Oophorectomy  . Sleep apnea    unable to tolreate cpap  . Wisdom teeth extracted     Past Surgical History:  Procedure Laterality Date  . ABDOMINAL HYSTERECTOMY  09/2000    Total hysterectomy and oophorectomy  . ABDOMINAL WOUND DEHISCENCE Bilateral    multiple times / mesh use with  multiple abdominal hernia repair  . CARDIOVERSION  2014   Unsuccessful.  Patient reports cardiac ablation was not felt to be appropriate next step by cardiology.  . CHOLECYSTECTOMY  04/1998   '98 -Lapraroscopic  . COLON RESECTION  2001   "colon cancer"  . FOOT SURGERY Bilateral    multiple "hammer toes and bunionectomies"  . KNEE ARTHROSCOPY Left 08/2006   meniscectomy  . MELANOMA EXCISION     near scapula  . TOTAL KNEE ARTHROPLASTY Right 01/24/2016   Procedure: RIGHT TOTAL KNEE ARTHROPLASTY;  Surgeon: Ollen GrossFrank Aluisio, MD;  Location: [facility];  Service: Orthopedics;  Laterality: Right;    Social History   Socioeconomic History  . Marital status: Single    Spouse name: Not on file  . Number of children: Not on file  . Years of education: Not on file  . Highest education level: Not on file  Occupational History  . Not on file  Tobacco Use  . Smoking status: Former Smoker    Years: 25.00    Types: Cigarettes    Quit date: 01/17/1997    Years since quitting: 24.0  . Smokeless tobacco: Never Used  Vaping Use  . Vaping Use: Never used  Substance and Sexual Activity  . Alcohol use: No  . Drug use: No  . Sexual activity: Not Currently  Other Topics Concern  . Not on file  Social History Narrative  Marital status/children/pets: Single. G0P0.   Education/employment: Retired Oceanographer:      -smoke alarm in the home:Yes     - wears seatbelt: Yes     - Feels safe in their relationships: Yes   Social Determinants of Health   Financial Resource Strain: Not on file  Food Insecurity: Not on file  Transportation Needs: Not on file  Physical Activity: Not on file  Stress: Not on file  Social Connections: Not on file  Intimate Partner Violence: Not on file    Current Outpatient Medications on File Prior to Visit  Medication Sig Dispense Refill  . allopurinol (ZYLOPRIM) 100 MG tablet Take 1 tablet (100 mg total) by mouth daily. 90 tablet 3  .  ALPRAZolam (XANAX) 0.25 MG tablet Take 1 tablet (0.25 mg total) by mouth daily as needed for anxiety. 15 tablet 1  . Biotin 1000 MCG tablet Take 1,000 mcg by mouth daily.    . colchicine 0.6 MG tablet 0.6 mg (one tab) p.o. TID at gout onset, then one tab daily until flare is resolved (PRN) 30 tablet 0  . dabigatran (PRADAXA) 150 MG CAPS capsule Take 1 capsule (150 mg total) by mouth 2 (two) times daily. 60 capsule 11  . Dulaglutide (TRULICITY) 6.30 ZS/0.1UX SOPN Inject 0.75 mg into the skin once a week. 6 mL 3  . EPIPEN 2-PAK 0.3 MG/0.3ML SOAJ injection Inject 0.3 mg into the skin as needed. Bee stings  1  . fluticasone (FLONASE) 50 MCG/ACT nasal spray Place 2 sprays into both nostrils daily. (Patient taking differently: Place 2 sprays into both nostrils daily as needed.) 16 g 6  . levothyroxine (SYNTHROID) 50 MCG tablet Take 1 tablet (50 mcg total) by mouth daily before breakfast. 30 tablet 0  . metoprolol tartrate (LOPRESSOR) 50 MG tablet Take 1 tablet (50 mg total) by mouth 2 (two) times daily. 30 tablet 0  . olmesartan-hydrochlorothiazide (BENICAR HCT) 40-25 MG tablet Take 1 tablet by mouth daily. 30 tablet 0  . TART CHERRY PO Take 1 tablet by mouth.    . Vitamin D, Cholecalciferol, 25 MCG (1000 UT) CAPS Take 2 capsules by mouth daily.     No current facility-administered medications on file prior to visit.    Allergies  Allergen Reactions  . Amoxicillin Shortness Of Breath, Swelling and Anaphylaxis    Has patient had a PCN reaction causing immediate rash, facial/tongue/throat swelling, SOB or lightheadedness with hypotension: Yes Has patient had a PCN reaction causing severe rash involving mucus membranes or skin necrosis: No Has patient had a PCN reaction that required hospitalization No Has patient had a PCN reaction occurring within the last 10 years: Yes If all of the above answers are "NO", then may proceed with Cephalosporin use.  Has patient had a PCN reaction causing immediate  rash, facial/tongue/throat swelling, SOB or lightheadedness with hypotension: Yes Has patient had a PCN reaction causing severe rash involving mucus membranes or skin necrosis: No Has patient had a PCN reaction that required hospitalization No Has patient had a PCN reaction occurring within the last 10 years: Yes If all of the above answers are "NO", then may proceed with Cephalosporin use. Has patient had a PCN reaction causing immediate rash, facial/tongue/throat swelling, SOB or lightheadedness with hypotension: Yes Has patient had a PCN reaction causing severe rash involving mucus membranes or skin necrosis: No Has patient had a PCN reaction that required hospitalization No Has patient had a PCN reaction occurring within the  last 10 years: Yes If all of the above answers are "NO", then may proceed with Cephalosporin use.   . Codeine Itching    Tolerates hydrocodone  . Ezetimibe   . Livalo [Pitavastatin]   . Statins Other (See Comments)    Muscle skeletal side effects  . Welchol [Colesevelam]     Gi se    Family History  Problem Relation Age of Onset  . Arthritis Mother   . Colon cancer Mother   . Diabetes Mother   . Atrial fibrillation Mother   . Diabetes Father   . Heart attack Father   . Heart disease Father   . Stroke Father   . Hypertension Maternal Grandmother   . Stroke Maternal Grandmother   . Stroke Maternal Grandfather   . Hypertension Paternal Grandmother   . Stroke Paternal Grandmother   . Diabetes Paternal Grandfather   . Hypertension Paternal Grandfather   . Stroke Paternal Grandfather   . Diabetes Brother   . Brain cancer Brother   . Heart disease Brother   . Hypertension Brother   . Stroke Brother     BP 140/90 (BP Location: Right Arm, Patient Position: Sitting, Cuff Size: Large)   Pulse 86   Ht 5\' 8"  (1.727 m)   Wt 285 lb 12.8 oz (129.6 kg)   SpO2 97%   BMI 43.46 kg/m    Review of Systems Denies LOC.  She has intermitt nausea.       Objective:   Physical Exam VITAL SIGNS:  See vs page GENERAL: no distress Pulses: dorsalis pedis intact bilat.   MSK: no deformity of the feet.  CV: 2+ bilat leg edema, and bilat vv's.   Skin:  no ulcer on the feet, but there are bilat calluses.  normal color and temp on the feet.   Neuro: sensation is intact to touch on the feet.   Lab Results  Component Value Date   HGBA1C 7.6 (A) 01/12/2021       Assessment & Plan:  Insulin-requiring type 2 DM, with PAD Hypoglycemia, due to insulin.  Based on the pattern of her cbg's, she needs some adjustment in her therapy  Patient Instructions  check your blood sugar twice a day.  vary the time of day when you check, between before the 3 meals, and at bedtime.  also check if you have symptoms of your blood sugar being too high or too low.  please keep a record of the readings and bring it to your next appointment here (or you can bring the meter itself).  You can write it on any piece of paper.  please call us sooner if your blood sugar goes below 70, or if you have a lot of readings over 200.   Please continue the same Trulicity, and:  Reduce the Basaglar to 35 units twice a day, and:  Increase the Novolog to 28-30 units 3 times a day (just before each meal).   Please come back for a follow-up appointment in 2 months.

## 2021-02-14 ENCOUNTER — Telehealth: Payer: Self-pay

## 2021-02-14 NOTE — Telephone Encounter (Signed)
Pt is aware that he needles are her from [place] pt assistance program and ready to pick up

## 2021-02-15 ENCOUNTER — Telehealth: Payer: Self-pay | Admitting: Endocrinology

## 2021-02-15 NOTE — Telephone Encounter (Signed)
Pt came to pick up her pen needles

## 2021-03-01 DIAGNOSIS — E119 Type 2 diabetes mellitus without complications: Secondary | ICD-10-CM | POA: Diagnosis not present

## 2021-03-01 DIAGNOSIS — H2513 Age-related nuclear cataract, bilateral: Secondary | ICD-10-CM | POA: Diagnosis not present

## 2021-03-01 DIAGNOSIS — Z794 Long term (current) use of insulin: Secondary | ICD-10-CM | POA: Diagnosis not present

## 2021-03-01 LAB — HM DIABETES EYE EXAM

## 2021-03-17 ENCOUNTER — Other Ambulatory Visit: Payer: Self-pay | Admitting: Endocrinology

## 2021-03-17 ENCOUNTER — Other Ambulatory Visit: Payer: Self-pay

## 2021-03-17 ENCOUNTER — Ambulatory Visit (INDEPENDENT_AMBULATORY_CARE_PROVIDER_SITE_OTHER): Payer: Medicare Other | Admitting: Endocrinology

## 2021-03-17 VITALS — BP 140/84 | HR 90 | Ht 68.0 in | Wt 283.2 lb

## 2021-03-17 DIAGNOSIS — E119 Type 2 diabetes mellitus without complications: Secondary | ICD-10-CM

## 2021-03-17 LAB — POCT GLYCOSYLATED HEMOGLOBIN (HGB A1C): Hemoglobin A1C: 7.9 % — AB (ref 4.0–5.6)

## 2021-03-17 MED ORDER — INSULIN ASPART 100 UNIT/ML ~~LOC~~ SOLN: 35.0000 [IU] | Freq: Three times a day (TID) | SUBCUTANEOUS | 3 refills | Status: DC

## 2021-03-17 MED ORDER — BASAGLAR KWIKPEN 100 UNIT/ML ~~LOC~~ SOPN: 30.0000 [IU] | PEN_INJECTOR | Freq: Two times a day (BID) | SUBCUTANEOUS | 3 refills | Status: DC

## 2021-03-17 MED ORDER — DEXCOM G6 SENSOR MISC: 1.0000 | 3 refills | Status: DC

## 2021-03-17 MED ORDER — DEXCOM G6 TRANSMITTER MISC: 1.0000 | Freq: Once | 1 refills | Status: AC

## 2021-03-17 MED ORDER — DEXCOM G6 RECEIVER DEVI
1.0000 | Freq: Once | 1 refills | Status: DC
Start: 2021-03-17 — End: 2021-03-18

## 2021-03-17 NOTE — Progress Notes (Signed)
Subjective:    Patient ID: Ann [name], female    DOB: December 06, 1951, 70 y.o.   MRN: [id number]  HPI Pt returns for f/u of diabetes mellitus: DM type: Insulin-requiring type 2 Dx'ed: 2637 Complications: PAD Therapy: insulin since soon after dx, and Trulicity GDM: never (G0) DKA: never Severe hypoglycemia: never Pancreatitis: never Pancreatic imaging: neg on 2017 CT SDOH: she gets meds from pt assist.   Other: she did not tolerate glipizide (flatulence), or Farxiga (vaginitis); She needs to lose weight, for TKR.  Interval history: she brings her meter with her cbg's which I have reviewed today.  cbg varies from 102-407.  It is in general highest in the afternoon, and lowest fasting.   Pt says Trulicity is causing difficulty with urination, so she stopped it 10 days ago.   Past Medical History:  Diagnosis Date  . Arthritis    osteoarthritis- knees.  . Atrial fibrillation ([place])    Dr. Marcello Moores status post cardioversion chronic anticoagulation  . Chicken pox   . Colon cancer ([place]) 2001   Radiation, chemo.  Adenocarcinoma of the colon.  Followed by Dr. Derrel Nip  . Complication of anesthesia    sensitive to narcotics"extra sleepy". Versed- "no memory recall for 4 days postop"  . Diabetes mellitus without complication (HCC)    oral and Insulin  . Hepatitis    "sub clinical case of Hepatitis in late 70's""has immunities"  . Hypertension   . Hypothyroidism   . Laceration of head 08/04/2019   8 cm laceration forehead/scalp after tripping when mowing her lawn.  . Melanoma ([place])    wide excision near scapula  . Mixed hyperlipidemia   . 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  . Ovarian teratoma, right 09/2000   Oophorectomy  . Sleep apnea    unable to tolreate cpap  . Wisdom teeth extracted     Past Surgical History:  Procedure Laterality Date  . ABDOMINAL HYSTERECTOMY  09/2000    Total hysterectomy and oophorectomy  . ABDOMINAL WOUND  DEHISCENCE Bilateral    multiple times / mesh use with multiple abdominal hernia repair  . CARDIOVERSION  2014   Unsuccessful.  Patient reports cardiac ablation was not felt to be appropriate next step by cardiology.  . CHOLECYSTECTOMY  04/1998   '98 -Lapraroscopic  . COLON RESECTION  2001   "colon cancer"  . FOOT SURGERY Bilateral    multiple "hammer toes and bunionectomies"  . KNEE ARTHROSCOPY Left 08/2006   meniscectomy  . MELANOMA EXCISION     near scapula  . TOTAL KNEE ARTHROPLASTY Right 01/24/2016   Procedure: RIGHT TOTAL KNEE ARTHROPLASTY;  Surgeon: Gaynelle Arabian, MD;  Location: [facility];  Service: Orthopedics;  Laterality: Right;    Social History   Socioeconomic History  . Marital status: Single    Spouse name: Not on file  . Number of children: Not on file  . Years of education: Not on file  . Highest education level: Not on file  Occupational History  . Not on file  Tobacco Use  . Smoking status: Former Smoker    Years: 25.00    Types: Cigarettes    Quit date: 01/17/1997    Years since quitting: 24.1  . Smokeless tobacco: Never Used  Vaping Use  . Vaping Use: Never used  Substance and Sexual Activity  . Alcohol use: No  . Drug use: No  . Sexual activity: Not Currently  Other Topics Concern  .  Not on file  Social History Narrative   Marital status/children/pets: Single. G0P0.   Education/employment: Retired Oceanographer:      -smoke alarm in the home:Yes     - wears seatbelt: Yes     - Feels safe in their relationships: Yes   Social Determinants of Health   Financial Resource Strain: Not on file  Food Insecurity: Not on file  Transportation Needs: Not on file  Physical Activity: Not on file  Stress: Not on file  Social Connections: Not on file  Intimate Partner Violence: Not on file    Current Outpatient Medications on File Prior to Visit  Medication Sig Dispense Refill  . ALPRAZolam (XANAX) 0.25 MG tablet Take 1  tablet (0.25 mg total) by mouth daily as needed for anxiety. 15 tablet 1  . Biotin 1000 MCG tablet Take 1,000 mcg by mouth daily.    . dabigatran (PRADAXA) 150 MG CAPS capsule Take 1 capsule (150 mg total) by mouth 2 (two) times daily. 60 capsule 11  . EPIPEN 2-PAK 0.3 MG/0.3ML SOAJ injection Inject 0.3 mg into the skin as needed. Bee stings  1  . fluticasone (FLONASE) 50 MCG/ACT nasal spray Place 2 sprays into both nostrils daily. (Patient taking differently: Place 2 sprays into both nostrils daily as needed.) 16 g 6  . levothyroxine (SYNTHROID) 50 MCG tablet Take 1 tablet (50 mcg total) by mouth daily before breakfast. 30 tablet 0  . metoprolol tartrate (LOPRESSOR) 50 MG tablet Take 1 tablet (50 mg total) by mouth 2 (two) times daily. 30 tablet 0  . olmesartan-hydrochlorothiazide (BENICAR HCT) 40-25 MG tablet Take 1 tablet by mouth daily. 30 tablet 0  . TART CHERRY PO Take 1 tablet by mouth.     No current facility-administered medications on file prior to visit.    Allergies  Allergen Reactions  . Amoxicillin Shortness Of Breath, Swelling and Anaphylaxis    Has patient had a PCN reaction causing immediate rash, facial/tongue/throat swelling, SOB or lightheadedness with hypotension: Yes Has patient had a PCN reaction causing severe rash involving mucus membranes or skin necrosis: No Has patient had a PCN reaction that required hospitalization No Has patient had a PCN reaction occurring within the last 10 years: Yes If all of the above answers are "NO", then may proceed with Cephalosporin use.  Has patient had a PCN reaction causing immediate rash, facial/tongue/throat swelling, SOB or lightheadedness with hypotension: Yes Has patient had a PCN reaction causing severe rash involving mucus membranes or skin necrosis: No Has patient had a PCN reaction that required hospitalization No Has patient had a PCN reaction occurring within the last 10 years: Yes If all of the above answers are "NO",  then may proceed with Cephalosporin use. Has patient had a PCN reaction causing immediate rash, facial/tongue/throat swelling, SOB or lightheadedness with hypotension: Yes Has patient had a PCN reaction causing severe rash involving mucus membranes or skin necrosis: No Has patient had a PCN reaction that required hospitalization No Has patient had a PCN reaction occurring within the last 10 years: Yes If all of the above answers are "NO", then may proceed with Cephalosporin use.   . Codeine Itching    Tolerates hydrocodone  . Ezetimibe   . Livalo [Pitavastatin]   . Statins Other (See Comments)    Muscle skeletal side effects  . Welchol [Colesevelam]     Gi se    Family History  Problem Relation Age of Onset  . Arthritis  Mother   . Colon cancer Mother   . Diabetes Mother   . Atrial fibrillation Mother   . Diabetes Father   . Heart attack Father   . Heart disease Father   . Stroke Father   . Hypertension Maternal Grandmother   . Stroke Maternal Grandmother   . Stroke Maternal Grandfather   . Hypertension Paternal Grandmother   . Stroke Paternal Grandmother   . Diabetes Paternal Grandfather   . Hypertension Paternal Grandfather   . Stroke Paternal Grandfather   . Diabetes Brother   . Brain cancer Brother   . Heart disease Brother   . Hypertension Brother   . Stroke Brother     BP 140/84 (BP Location: Right Arm, Patient Position: Sitting, Cuff Size: Large)   Pulse 90   Ht 5\' 8"  (1.727 m)   Wt 283 lb 3.2 oz (128.5 kg)   SpO2 94%   BMI 43.06 kg/m   Review of Systems     Objective:   Physical Exam VITAL SIGNS:  See vs page GENERAL: no distress Pulses: dorsalis pedis intact bilat.   MSK: no deformity of the feet.  CV: 2+ bilat leg edema, and bilat vv's.   Skin: healing ulcer at the plantar aspect of the right great toe (pt says much better).  there are bilat calluses.  normal color and temp on the feet.   Neuro: sensation is intact to touch on the feet.    Lab  Results  Component Value Date   HGBA1C 7.9 (A) 03/17/2021       Assessment & Plan:  Insulin-requiring type 2 DM. The pattern of his cbg's indicates he needs some adjustment in his therapy Urinary sxs, due to Trulicity.   Patient Instructions  check your blood sugar twice a day.  vary the time of day when you check, between before the 3 meals, and at bedtime.  also check if you have symptoms of your blood sugar being too high or too low.  please keep a record of the readings and bring it to your next appointment here (or you can bring the meter itself).  You can write it on any piece of paper.  please call us sooner if your blood sugar goes below 70, or if you have a lot of readings over 200.   Please stay off the Trulicity, and:  Reduce the Basaglar to 30 units twice a day, and:  Increase the Novolog to 35-45 units 3 times a day (just before each meal).   Please see Dr Raoul Pitch about the urinary symptoms.   Please come back for a follow-up appointment in 3 months.

## 2021-03-17 NOTE — Patient Instructions (Addendum)
check your blood sugar twice a day.  vary the time of day when you check, between before the 3 meals, and at bedtime.  also check if you have symptoms of your blood sugar being too high or too low.  please keep a record of the readings and bring it to your next appointment here (or you can bring the meter itself).  You can write it on any piece of paper.  please call us sooner if your blood sugar goes below 70, or if you have a lot of readings over 200.   Please stay off the Trulicity, and:  Reduce the Basaglar to 30 units twice a day, and:  Increase the Novolog to 35-45 units 3 times a day (just before each meal).   Please see Dr Raoul Pitch about the urinary symptoms.   Please come back for a follow-up appointment in 3 months.

## 2021-03-18 ENCOUNTER — Ambulatory Visit (INDEPENDENT_AMBULATORY_CARE_PROVIDER_SITE_OTHER): Payer: Medicare Other | Admitting: Family Medicine

## 2021-03-18 ENCOUNTER — Encounter: Payer: Self-pay | Admitting: Family Medicine

## 2021-03-18 VITALS — BP 126/81 | HR 80 | Temp 98.1°F | Ht 68.0 in | Wt 282.0 lb

## 2021-03-18 DIAGNOSIS — R339 Retention of urine, unspecified: Secondary | ICD-10-CM | POA: Diagnosis not present

## 2021-03-18 DIAGNOSIS — I059 Rheumatic mitral valve disease, unspecified: Secondary | ICD-10-CM | POA: Diagnosis not present

## 2021-03-18 DIAGNOSIS — E119 Type 2 diabetes mellitus without complications: Secondary | ICD-10-CM

## 2021-03-18 DIAGNOSIS — I4821 Permanent atrial fibrillation: Secondary | ICD-10-CM | POA: Diagnosis not present

## 2021-03-18 DIAGNOSIS — E039 Hypothyroidism, unspecified: Secondary | ICD-10-CM

## 2021-03-18 DIAGNOSIS — R3911 Hesitancy of micturition: Secondary | ICD-10-CM | POA: Diagnosis not present

## 2021-03-18 DIAGNOSIS — E782 Mixed hyperlipidemia: Secondary | ICD-10-CM | POA: Diagnosis not present

## 2021-03-18 DIAGNOSIS — I10 Essential (primary) hypertension: Secondary | ICD-10-CM | POA: Diagnosis not present

## 2021-03-18 LAB — COMPREHENSIVE METABOLIC PANEL
ALT: 16 U/L (ref 0–35)
AST: 19 U/L (ref 0–37)
Albumin: 4.1 g/dL (ref 3.5–5.2)
Alkaline Phosphatase: 73 U/L (ref 39–117)
BUN: 17 mg/dL (ref 6–23)
CO2: 29 mEq/L (ref 19–32)
Calcium: 8.9 mg/dL (ref 8.4–10.5)
Chloride: 98 mEq/L (ref 96–112)
Creatinine, Ser: 0.89 mg/dL (ref 0.40–1.20)
GFR: 65.11 mL/min (ref >60.00)
Glucose, Bld: 164 mg/dL — ABNORMAL HIGH (ref 70–99)
Potassium: 4.2 mEq/L (ref 3.5–5.1)
Sodium: 136 mEq/L (ref 135–145)
Total Bilirubin: 1.4 mg/dL — ABNORMAL HIGH (ref 0.2–1.2)
Total Protein: 6.9 g/dL (ref 6.0–8.3)

## 2021-03-18 LAB — POCT URINALYSIS DIPSTICK
Bilirubin, UA: NEGATIVE
Blood, UA: NEGATIVE
Glucose, UA: NEGATIVE
Ketones, UA: NEGATIVE
Leukocytes, UA: NEGATIVE
Nitrite, UA: NEGATIVE
Protein, UA: NEGATIVE
Spec Grav, UA: >=1.03 — AB (ref 1.010–1.025)
Urobilinogen, UA: 1 E.U./dL
pH, UA: 6 (ref 5.0–8.0)

## 2021-03-18 LAB — CBC
HCT: 45 % (ref 36.0–46.0)
Hemoglobin: 15.1 g/dL — ABNORMAL HIGH (ref 12.0–15.0)
MCHC: 33.6 g/dL (ref 30.0–36.0)
MCV: 90.4 fl (ref 78.0–100.0)
Platelets: 175 10*3/uL (ref 150.0–400.0)
RBC: 4.97 Mil/uL (ref 3.87–5.11)
RDW: 14.1 % (ref 11.5–15.5)
WBC: 9 10*3/uL (ref 4.0–10.5)

## 2021-03-18 LAB — TSH: TSH: 2.85 u[IU]/mL (ref 0.35–4.50)

## 2021-03-18 NOTE — Progress Notes (Signed)
This visit occurred during the SARS-CoV-2 public health emergency.  Safety protocols were in place, including screening questions prior to the visit, additional usage of staff PPE, and extensive cleaning of exam room while observing appropriate contact time as indicated for disinfecting solutions.    Ann [name] , 1951/03/05, 70 y.o., female MRN: [id number] Patient Care Team    Relationship Specialty Notifications Start End  Ma Hillock, DO PCP - General Family Medicine  04/20/20   Adrian Prows, MD Consulting Physician Cardiology  04/20/20   Druscilla Brownie, MD Referring Physician Dermatology  04/20/20   Otelia Sergeant, OD Referring Physician   04/20/20   Luci Bank, MD Referring Physician Orthotics  04/20/20   Sonda Primes, MD Referring Physician Obstetrics and Gynecology  04/20/20   Rosana Berger, MD Referring Physician Gastroenterology  04/20/20     Chief Complaint  Patient presents with  . urinary hesitancy    Pt seen endo yesterday and expressed that has been experiencing trouble going to the bathroom since started Trulicity x 6 mos     Subjective: Pt presents for an OV with complaints of urinary hesitancy of x6 mos duration.  Associated symptoms include she reports she feels she needs to "push" to get all the urine out of her bladder. She does not feel she is completely emptying her bladder when she voids. She is uncertain if it is related to her start Trulicity 6 mos. Ago and has concerns about her Kidney health. She denies fever, chills, kidney pain, hematuria or dysuria.   Depression screen [facility] 2/9 03/18/2021 07/23/2020 04/20/2020  Decreased Interest 0 0 0  Down, Depressed, Hopeless 0 0 0  PHQ - 2 Score 0 0 0    Allergies  Allergen Reactions  . Amoxicillin Shortness Of Breath, Swelling and Anaphylaxis    Has patient had a PCN reaction causing immediate rash, facial/tongue/throat swelling, SOB or lightheadedness with hypotension: Yes Has patient had a PCN reaction  causing severe rash involving mucus membranes or skin necrosis: No Has patient had a PCN reaction that required hospitalization No Has patient had a PCN reaction occurring within the last 10 years: Yes If all of the above answers are "NO", then may proceed with Cephalosporin use.  Has patient had a PCN reaction causing immediate rash, facial/tongue/throat swelling, SOB or lightheadedness with hypotension: Yes Has patient had a PCN reaction causing severe rash involving mucus membranes or skin necrosis: No Has patient had a PCN reaction that required hospitalization No Has patient had a PCN reaction occurring within the last 10 years: Yes If all of the above answers are "NO", then may proceed with Cephalosporin use. Has patient had a PCN reaction causing immediate rash, facial/tongue/throat swelling, SOB or lightheadedness with hypotension: Yes Has patient had a PCN reaction causing severe rash involving mucus membranes or skin necrosis: No Has patient had a PCN reaction that required hospitalization No Has patient had a PCN reaction occurring within the last 10 years: Yes If all of the above answers are "NO", then may proceed with Cephalosporin use.   . Codeine Itching    Tolerates hydrocodone  . Ezetimibe   . Livalo [Pitavastatin]   . Statins Other (See Comments)    Muscle skeletal side effects  . Welchol [Colesevelam]     Gi se   Social History   Social History Narrative   Marital status/children/pets: Single. G0P0.   Education/employment: Retired Oceanographer:      -  smoke alarm in the home:Yes     - wears seatbelt: Yes     - Feels safe in their relationships: Yes   Past Medical History:  Diagnosis Date  . Arthritis    osteoarthritis- knees.  . Atrial fibrillation ([place])    Dr. Marcello Moores status post cardioversion chronic anticoagulation  . Cellulitis of right lower extremity 12/30/2020  . Chicken pox   . Colon cancer ([place]) 2001   Radiation,  chemo.  Adenocarcinoma of the colon.  Followed by Dr. Derrel Nip  . Complication of anesthesia    sensitive to narcotics"extra sleepy". Versed- "no memory recall for 4 days postop"  . Diabetes mellitus without complication (HCC)    oral and Insulin  . Hepatitis    "sub clinical case of Hepatitis in late 70's""has immunities"  . Hypertension   . Hypothyroidism   . Laceration of head 08/04/2019   8 cm laceration forehead/scalp after tripping when mowing her lawn.  . Melanoma ([place])    wide excision near scapula  . Mixed hyperlipidemia   . 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  . Ovarian teratoma, right 09/2000   Oophorectomy  . Sleep apnea    unable to tolreate cpap  . Wisdom teeth extracted    Past Surgical History:  Procedure Laterality Date  . ABDOMINAL HYSTERECTOMY  09/2000    Total hysterectomy and oophorectomy  . ABDOMINAL WOUND DEHISCENCE Bilateral    multiple times / mesh use with multiple abdominal hernia repair  . CARDIOVERSION  2014   Unsuccessful.  Patient reports cardiac ablation was not felt to be appropriate next step by cardiology.  . CHOLECYSTECTOMY  04/1998   '98 -Lapraroscopic  . COLON RESECTION  2001   "colon cancer"  . FOOT SURGERY Bilateral    multiple "hammer toes and bunionectomies"  . KNEE ARTHROSCOPY Left 08/2006   meniscectomy  . MELANOMA EXCISION     near scapula  . TOTAL KNEE ARTHROPLASTY Right 01/24/2016   Procedure: RIGHT TOTAL KNEE ARTHROPLASTY;  Surgeon: Gaynelle Arabian, MD;  Location: [facility];  Service: Orthopedics;  Laterality: Right;   Family History  Problem Relation Age of Onset  . Arthritis Mother   . Colon cancer Mother   . Diabetes Mother   . Atrial fibrillation Mother   . Diabetes Father   . Heart attack Father   . Heart disease Father   . Stroke Father   . Hypertension Maternal Grandmother   . Stroke Maternal Grandmother   . Stroke Maternal Grandfather   . Hypertension Paternal Grandmother   .  Stroke Paternal Grandmother   . Diabetes Paternal Grandfather   . Hypertension Paternal Grandfather   . Stroke Paternal Grandfather   . Diabetes Brother   . Brain cancer Brother   . Heart disease Brother   . Hypertension Brother   . Stroke Brother    Allergies as of 03/18/2021      Reactions   Amoxicillin Shortness Of Breath, Swelling, Anaphylaxis   Has patient had a PCN reaction causing immediate rash, facial/tongue/throat swelling, SOB or lightheadedness with hypotension: Yes Has patient had a PCN reaction causing severe rash involving mucus membranes or skin necrosis: No Has patient had a PCN reaction that required hospitalization No Has patient had a PCN reaction occurring within the last 10 years: Yes If all of the above answers are "NO", then may proceed with Cephalosporin use. Has patient had a PCN reaction causing immediate rash, facial/tongue/throat swelling, SOB or lightheadedness  with hypotension: Yes Has patient had a PCN reaction causing severe rash involving mucus membranes or skin necrosis: No Has patient had a PCN reaction that required hospitalization No Has patient had a PCN reaction occurring within the last 10 years: Yes If all of the above answers are "NO", then may proceed with Cephalosporin use. Has patient had a PCN reaction causing immediate rash, facial/tongue/throat swelling, SOB or lightheadedness with hypotension: Yes Has patient had a PCN reaction causing severe rash involving mucus membranes or skin necrosis: No Has patient had a PCN reaction that required hospitalization No Has patient had a PCN reaction occurring within the last 10 years: Yes If all of the above answers are "NO", then may proceed with Cephalosporin use.   Codeine Itching   Tolerates hydrocodone   Ezetimibe    Livalo [pitavastatin]    Statins Other (See Comments)   Muscle skeletal side effects   Welchol [colesevelam]    Gi se      Medication List       Accurate as of March 18, 2021 11:59 PM. If you have any questions, ask your nurse or doctor.        STOP taking these medications   allopurinol 100 MG tablet Commonly known as: ZYLOPRIM Stopped by: Howard Pouch, DO   colchicine 0.6 MG tablet Stopped by: Howard Pouch, DO   Vitamin D (Cholecalciferol) 25 MCG (1000 UT) Caps Stopped by: Howard Pouch, DO     TAKE these medications   ALPRAZolam 0.25 MG tablet Commonly known as: XANAX Take 1 tablet (0.25 mg total) by mouth daily as needed for anxiety.   Basaglar KwikPen 100 UNIT/ML Inject 30 Units into the skin 2 (two) times daily.   Biotin 1000 MCG tablet Take 1,000 mcg by mouth daily.   dabigatran 150 MG Caps capsule Commonly known as: PRADAXA Take 1 capsule (150 mg total) by mouth 2 (two) times daily.   Dexcom G6 Receiver Devi USE AS DIRECTED   Dexcom G6 Sensor Misc 1 Device by Does not apply route See admin instructions. Change every 10 days   EpiPen 2-Pak 0.3 mg/0.3 mL Soaj injection Generic drug: EPINEPHrine Inject 0.3 mg into the skin as needed. Bee stings   fluticasone 50 MCG/ACT nasal spray Commonly known as: FLONASE Place 2 sprays into both nostrils daily. What changed:   when to take this  reasons to take this   insulin aspart 100 UNIT/ML injection Commonly known as: novoLOG Inject 35-45 Units into the skin 3 (three) times daily with meals.   levothyroxine 50 MCG tablet Commonly known as: SYNTHROID Take 1 tablet (50 mcg total) by mouth daily before breakfast.   metoprolol tartrate 50 MG tablet Commonly known as: LOPRESSOR Take 1 tablet (50 mg total) by mouth 2 (two) times daily.   olmesartan-hydrochlorothiazide 40-25 MG tablet Commonly known as: BENICAR HCT Take 1 tablet by mouth daily.   OneTouch Delica Plus [id number] Misc Apply 1 each topically 3 (three) times daily.   OneTouch Ultra test strip Generic drug: glucose blood SMARTSIG:Via Meter   TART CHERRY PO Take 1 tablet by mouth.   triamcinolone ointment 0.5  % Commonly known as: KENALOG Apply topically 2 (two) times daily as needed.       All past medical history, surgical history, allergies, family history, immunizations andmedications were updated in the EMR today and reviewed under the history and medication portions of their EMR.     ROS: Negative, with the exception of above mentioned in HPI  Objective:  BP 126/81   Pulse 80   Temp 98.1 F (36.7 C) (Oral)   Ht _0  (1.727 m)   Wt 282 lb (127.9 kg)   SpO2 98%   BMI 42.88 kg/m  Body mass index is 42.88 kg/m. Gen: Afebrile. No acute distress. Nontoxic in appearance, well developed, well nourished. Obese pleasant female.  HENT: AT. [place]. Eyes:Pupils Equal Round Reactive to light, Extraocular movements intact,  Conjunctiva without redness, discharge or icterus. CV: RRR no murmur, +1/trace bilateral Lower ext edema Chest: CTAB, no wheeze or crackles. Good air movement, normal resp effort.  Abd: Soft. NTND. BS present.  Neuro: *Normal gait. PERLA. EOMi. Alert. Oriented x3 Psych: Normal affect, dress and demeanor. Normal speech. Normal thought content and judgment.  No exam data present No results found. No results found for this or any previous visit (from the past 24 hour(s)).  Assessment/Plan: ANNELIE BOAK is a 70 y.o. female present for OV for  Urinary hesitancy/Incomplete emptying of bladder Uncertain eitiology-potentially bladder related vs kidney dysfx. Discussed referral to urology for urine studies- she is agreeable to this approach today - POCT Urinalysis Dipstick> normal today. Will send microanalysis to be complete - Urinalysis w microscopic + reflex cultur - Comp Met (CMET) - CBC - Ambulatory referral to Urology  Acquired hypothyroidism - TSH - continue levo 50 mcg> refills will be provided after results.   Primary hypertension/Permanent atrial fibrillation (HCC)/Mixed hyperlipidemia/ Mitral valve disease Stable.  Continue metoprolol 50 mg BID.   Continue benicar-hctz 40-25 Low sodium diet.  Cbc, cmp, tsh collected today F/u 5.5 months cmc  Diabetes mellitus without complication ([place]) Managed by endocrine.      Reviewed expectations re: course of current medical issues.  Discussed self-management of symptoms.  Outlined signs and symptoms indicating need for more acute intervention.  Patient verbalized understanding and all questions were answered.  Patient received an After-Visit Summary.    Orders Placed This Encounter  Procedures  . Urinalysis w microscopic + reflex cultur  . Comp Met (CMET)  . TSH  . CBC  . Ambulatory referral to Urology  . POCT Urinalysis Dipstick   Meds ordered this encounter  Medications  . olmesartan-hydrochlorothiazide (BENICAR HCT) 40-25 MG tablet    Sig: Take 1 tablet by mouth daily.    Dispense:  90 tablet    Refill:  1  . levothyroxine (SYNTHROID) 50 MCG tablet    Sig: Take 1 tablet (50 mcg total) by mouth daily before breakfast.    Dispense:  90 tablet    Refill:  3  . metoprolol tartrate (LOPRESSOR) 50 MG tablet    Sig: Take 1 tablet (50 mg total) by mouth 2 (two) times daily.    Dispense:  180 tablet    Refill:  1    Referral Orders     Ambulatory referral to Urology   Note is dictated utilizing voice recognition software. Although note has been proof read prior to signing, occasional typographical errors still can be missed. If any questions arise, please do not hesitate to call for verification.   electronically signed by:  Howard Pouch, DO  [place]

## 2021-03-18 NOTE — Patient Instructions (Signed)
Fasting lab day of your medicare wellness.   We will call you with your results of your labs.

## 2021-03-19 ENCOUNTER — Encounter: Payer: Self-pay | Admitting: Family Medicine

## 2021-03-20 ENCOUNTER — Encounter: Payer: Self-pay | Admitting: Family Medicine

## 2021-03-21 ENCOUNTER — Encounter: Payer: Self-pay | Admitting: Family Medicine

## 2021-03-21 DIAGNOSIS — R3911 Hesitancy of micturition: Secondary | ICD-10-CM | POA: Diagnosis not present

## 2021-03-21 MED ORDER — OLMESARTAN MEDOXOMIL-HCTZ 40-25 MG PO TABS: 1.0000 | ORAL_TABLET | Freq: Every day | ORAL | 1 refills | Status: DC

## 2021-03-21 MED ORDER — METOPROLOL TARTRATE 50 MG PO TABS: 50.0000 mg | ORAL_TABLET | Freq: Two times a day (BID) | ORAL | 1 refills | Status: DC

## 2021-03-21 MED ORDER — LEVOTHYROXINE SODIUM 50 MCG PO TABS: 50.0000 ug | ORAL_TABLET | Freq: Every day | ORAL | 3 refills | Status: DC

## 2021-03-22 ENCOUNTER — Encounter: Payer: Self-pay | Admitting: Family Medicine

## 2021-03-22 ENCOUNTER — Telehealth: Payer: Self-pay | Admitting: Family Medicine

## 2021-03-22 DIAGNOSIS — R17 Unspecified jaundice: Secondary | ICD-10-CM | POA: Insufficient documentation

## 2021-03-22 LAB — URINALYSIS W MICROSCOPIC + REFLEX CULTURE
Bacteria, UA: NONE SEEN /HPF
Bilirubin Urine: NEGATIVE
Glucose, UA: NEGATIVE
Hgb urine dipstick: NEGATIVE
Hyaline Cast: NONE SEEN /LPF
Ketones, ur: NEGATIVE
Leukocyte Esterase: NEGATIVE
Nitrites, Initial: NEGATIVE
Protein, ur: NEGATIVE
RBC / HPF: NONE SEEN /HPF (ref 0–2)
Specific Gravity, Urine: 1.021 (ref 1.001–1.03)
WBC, UA: NONE SEEN /HPF (ref 0–5)
pH: 5.5 (ref 5.0–8.0)

## 2021-03-22 LAB — NO CULTURE INDICATED

## 2021-03-22 NOTE — Telephone Encounter (Signed)
Please Advise. Left message for patient's return call regarding lab results and sent mychart on results sent earlier today.

## 2021-03-22 NOTE — Telephone Encounter (Signed)
MyChart message read. Please read the following message from patient and advise: "I am not going to be in town next week. The 25th is the Monday after Easter. I can come in the morning on the 27th, and return in the afternoon. I can come in for lab draw as early as you would like. Perhaps she will have the results by afternoon. The apt. On the 27th is dedicated for xanax renewal"

## 2021-03-22 NOTE — Telephone Encounter (Signed)
FYI  Please see below

## 2021-03-22 NOTE — Telephone Encounter (Signed)
Please call patient Urinalysis is normal.   Please schedule patient for lab appointment only in 1 week to recheck the bilirubin lab on blood, since it was elevated.  Order has been placed.  Thanks Dr. Raliegh Ip

## 2021-03-22 NOTE — Telephone Encounter (Signed)
LM for pt to return call regarding results.  

## 2021-03-23 ENCOUNTER — Encounter: Payer: Self-pay | Admitting: Family Medicine

## 2021-03-23 ENCOUNTER — Telehealth: Payer: Self-pay | Admitting: Endocrinology

## 2021-03-23 NOTE — Telephone Encounter (Signed)
Notified patient her patient assistance from [place] is here and ready for pick up. Pt states she will be by Thursday to pick it up.   Pt also wanted to let Dr.Ellison know she went to her general practioner and got her labs done. Her urinalysis came back normal and since she has stopped taking her Trulicity it seems like she isn't having those troubles urinating anymore. Pt states she is going to continue to stay off the Trulicity until her appt in July and speak to the Dr about it.

## 2021-03-23 NOTE — Telephone Encounter (Signed)
Too soon to collect bili again. Ok to wait until after Easter.

## 2021-03-24 ENCOUNTER — Other Ambulatory Visit: Payer: Self-pay | Admitting: Endocrinology

## 2021-03-24 MED ORDER — FREESTYLE LIBRE 2 READER DEVI: 1.0000 | Freq: Once | 1 refills | Status: AC

## 2021-03-24 MED ORDER — FREESTYLE LIBRE 2 SENSOR MISC: 1.0000 | 3 refills | Status: DC

## 2021-03-24 NOTE — Telephone Encounter (Signed)
Pt came to the front desk. 03/24/2021 at 3:14pm to pick up medication.

## 2021-03-30 ENCOUNTER — Telehealth: Payer: Self-pay | Admitting: Endocrinology

## 2021-03-30 NOTE — Telephone Encounter (Signed)
Pt got a letter from her patient assistance program for insulin aspart (NOVOLOG) 100 UNIT/ML injection pt can not get it refilled due to the incorrect amount of days.   Pt called manufacture and instead of 90 days the prescription needs to say and 120 days. Also needs the needles 32 gauge time 48mm

## 2021-03-30 NOTE — Telephone Encounter (Signed)
Please put the form on my desk, and i'll do it.

## 2021-03-30 NOTE — Telephone Encounter (Signed)
Message sent thru MyChart 

## 2021-03-31 ENCOUNTER — Other Ambulatory Visit: Payer: Self-pay | Admitting: Endocrinology

## 2021-04-01 ENCOUNTER — Encounter: Payer: Self-pay | Admitting: Endocrinology

## 2021-04-04 ENCOUNTER — Other Ambulatory Visit: Payer: Self-pay

## 2021-04-04 NOTE — Telephone Encounter (Signed)
Message sent to [facility] to Advise on

## 2021-04-04 NOTE — Telephone Encounter (Signed)
Patient called following up on her message she sent last week. Ph # [phone] - asked if she could be called back regarding this.

## 2021-04-05 ENCOUNTER — Other Ambulatory Visit: Payer: Self-pay | Admitting: Endocrinology

## 2021-04-05 MED ORDER — INSULIN ASPART 100 UNIT/ML ~~LOC~~ SOLN: 35.0000 [IU] | Freq: Three times a day (TID) | SUBCUTANEOUS | 3 refills | Status: DC

## 2021-04-06 ENCOUNTER — Ambulatory Visit: Payer: Medicare Other

## 2021-04-06 ENCOUNTER — Ambulatory Visit (INDEPENDENT_AMBULATORY_CARE_PROVIDER_SITE_OTHER): Payer: Medicare Other | Admitting: Family Medicine

## 2021-04-06 ENCOUNTER — Encounter: Payer: Self-pay | Admitting: Family Medicine

## 2021-04-06 ENCOUNTER — Other Ambulatory Visit: Payer: Self-pay

## 2021-04-06 VITALS — BP 122/80 | HR 81 | Temp 98.1°F | Ht 68.0 in | Wt 285.0 lb

## 2021-04-06 DIAGNOSIS — F419 Anxiety disorder, unspecified: Secondary | ICD-10-CM | POA: Diagnosis not present

## 2021-04-06 MED ORDER — ALPRAZOLAM 0.25 MG PO TABS: 0.2500 mg | ORAL_TABLET | Freq: Every day | ORAL | 1 refills | Status: DC | PRN

## 2021-04-06 MED ORDER — ZOLPIDEM TARTRATE 5 MG PO TABS: 5.0000 mg | ORAL_TABLET | Freq: Every evening | ORAL | 5 refills | Status: DC | PRN

## 2021-04-06 NOTE — Progress Notes (Signed)
Patient ID: Ann [name], female  DOB: Sep 03, 1951, 70 y.o.   MRN: [id number] Patient Care Team    Relationship Specialty Notifications Start End  Ma Hillock, DO PCP - General Family Medicine  04/20/20   Adrian Prows, MD Consulting Physician Cardiology  04/20/20   Druscilla Brownie, MD Referring Physician Dermatology  04/20/20   Otelia Sergeant, OD Referring Physician   04/20/20   Luci Bank, MD Referring Physician Orthotics  04/20/20   Sonda Primes, MD Referring Physician Obstetrics and Gynecology  04/20/20   Rosana Berger, MD Referring Physician Gastroenterology  04/20/20     Chief Complaint  Patient presents with  . Follow-up    [facility];     Subjective: Ann [name] is a 70 y.o.  female present for anxiety and sleep disturbance Sleep disturbance/anxiety Patient presents today to discuss her Xanax use.  She uses her Xanax very infrequently.  She states she only uses Xanax when she is feeling like she is having increased anxiety or panic, which is usually preceded by her feeling that her heart rate is increasing.  She states the Xanax will resolve this feeling quite quickly.  She had a prescription that was written last May for 15 tabs of Xanax 0.25 mg and 1 refill.  She was not aware she had the refill, but is down to only 2 Xanax left and she would like refills today.  She did not seem to realize the law and [place] in which she needs to be seen every 6 months to renew prescriptions that are controlled substances, and that they will automatically expire after 6 months whether there are refills left on the prescription or not.  Patient did sign a contract last year, we will update this today.  UDS was also collected last year. She states she still is not sleeping well without the Ambien.  She had been on Ambien 10 mg nightly in the past and then was having difficulties receiving Ambien prescriptions through her prior PCP.  We discussed today that Ambien 5 mg is the maximum  recommended dose for age.  She should be able to receive this medication.  She states that she thought Medicare would not pay for this medication in people over 65. Prior note: Patient reports she had been on Ambien up until last year.  She states her prior PCP would not refill her Ambien for her.  She states she still does not sleep well, but she will manage since she cannot have the Ambien.  By records it appears she was tried on Klonopin within the last few months.  She did not feel she needed type of medication for sleep, but needed a hypnotic.  She reports she has had a prescription for Xanax that dates back to around 2018.  She uses this when she has increased anxiety or heart racing.  She reports she is used this about 9 times in 3 years.  She would like a refill on Xanax.  She states her prior PCP would not refill Xanax or Ambien, therefore she left the practice.  Depression screen [facility] 2/9 03/18/2021 07/23/2020 04/20/2020  Decreased Interest 0 0 0  Down, Depressed, Hopeless 0 0 0  PHQ - 2 Score 0 0 0   No flowsheet data found.     Fall Risk  03/18/2021 07/23/2020  Falls in the past year? 0 1  Number falls in past yr: 0 0  Injury with Fall? 0 1    Immunization  History  Administered Date(s) Administered  . Influenza-Unspecified 09/26/2019  . Moderna Sars-Covid-2 Vaccination 01/22/2020, 02/20/2020, 10/20/2020  . Pneumococcal Conjugate-13 10/12/2014  . Pneumococcal Polysaccharide-23 10/21/2019  . Tdap 08/04/2019    No exam data present  Past Medical History:  Diagnosis Date  . Arthritis    osteoarthritis- knees.  . Atrial fibrillation ([place])    Dr. Marcello Moores status post cardioversion chronic anticoagulation  . Cellulitis of right lower extremity 12/30/2020  . Chicken pox   . Colon cancer ([place]) 2001   Radiation, chemo.  Adenocarcinoma of the colon.  Followed by Dr. Derrel Nip  . Complication of anesthesia    sensitive to narcotics"extra sleepy". Versed- "no memory recall for 4 days postop"   . Diabetes mellitus without complication ([facility])    oral and Insulin  . Hepatitis    "sub clinical case of Hepatitis in late 70's""has immunities"  . Hypertension   . Hypothyroidism   . Laceration of [name] 08/04/2019   8 cm laceration forehead/scalp after tripping when mowing her lawn.  . Melanoma ([place])    wide excision near scapula  . Mixed hyperlipidemia   . 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  . Ovarian teratoma, right 09/2000   Oophorectomy  . Sleep apnea    unable to tolreate cpap  . Wisdom teeth extracted    Allergies  Allergen Reactions  . Amoxicillin Shortness Of Breath, Swelling and Anaphylaxis    Has patient had a PCN reaction causing immediate rash, facial/tongue/throat swelling, SOB or lightheadedness with hypotension: Yes Has patient had a PCN reaction causing severe rash involving mucus membranes or skin necrosis: No Has patient had a PCN reaction that required hospitalization No Has patient had a PCN reaction occurring within the last 10 years: Yes If all of the above answers are "NO", then may proceed with Cephalosporin use.  Has patient had a PCN reaction causing immediate rash, facial/tongue/throat swelling, SOB or lightheadedness with hypotension: Yes Has patient had a PCN reaction causing severe rash involving mucus membranes or skin necrosis: No Has patient had a PCN reaction that required hospitalization No Has patient had a PCN reaction occurring within the last 10 years: Yes If all of the above answers are "NO", then may proceed with Cephalosporin use. Has patient had a PCN reaction causing immediate rash, facial/tongue/throat swelling, SOB or lightheadedness with hypotension: Yes Has patient had a PCN reaction causing severe rash involving mucus membranes or skin necrosis: No Has patient had a PCN reaction that required hospitalization No Has patient had a PCN reaction occurring within the last 10 years: Yes If all of  the above answers are "NO", then may proceed with Cephalosporin use.   . Codeine Itching    Tolerates hydrocodone  . Ezetimibe   . Livalo [Pitavastatin]   . Statins Other (See Comments)    Muscle skeletal side effects  . Welchol [Colesevelam]     Gi se   Past Surgical History:  Procedure Laterality Date  . ABDOMINAL HYSTERECTOMY  09/2000    Total hysterectomy and oophorectomy  . ABDOMINAL WOUND DEHISCENCE Bilateral    multiple times / mesh use with multiple abdominal hernia repair  . CARDIOVERSION  2014   Unsuccessful.  Patient reports cardiac ablation was not felt to be appropriate next step by cardiology.  . CHOLECYSTECTOMY  04/1998   '98 -Lapraroscopic  . COLON RESECTION  2001   "colon cancer"  . FOOT SURGERY Bilateral    multiple "hammer toes and  bunionectomies"  . KNEE ARTHROSCOPY Left 08/2006   meniscectomy  . MELANOMA EXCISION     near scapula  . TOTAL KNEE ARTHROPLASTY Right 01/24/2016   Procedure: RIGHT TOTAL KNEE ARTHROPLASTY;  Surgeon: Gaynelle Arabian, MD;  Location: [facility];  Service: Orthopedics;  Laterality: Right;   Family History  Problem Relation Age of Onset  . Arthritis Mother   . Colon cancer Mother   . Diabetes Mother   . Atrial fibrillation Mother   . Diabetes Father   . Heart attack Father   . Heart disease Father   . Stroke Father   . Hypertension Maternal Grandmother   . Stroke Maternal Grandmother   . Stroke Maternal Grandfather   . Hypertension Paternal Grandmother   . Stroke Paternal Grandmother   . Diabetes Paternal Grandfather   . Hypertension Paternal Grandfather   . Stroke Paternal Grandfather   . Diabetes Brother   . Brain cancer Brother   . Heart disease Brother   . Hypertension Brother   . Stroke Brother    Social History   Social History Narrative   Marital status/children/pets: Single. G0P0.   Education/employment: Retired Oceanographer:      -smoke alarm in the home:Yes     - wears  seatbelt: Yes     - Feels safe in their relationships: Yes    Allergies as of 04/06/2021      Reactions   Amoxicillin Shortness Of Breath, Swelling, Anaphylaxis   Has patient had a PCN reaction causing immediate rash, facial/tongue/throat swelling, SOB or lightheadedness with hypotension: Yes Has patient had a PCN reaction causing severe rash involving mucus membranes or skin necrosis: No Has patient had a PCN reaction that required hospitalization No Has patient had a PCN reaction occurring within the last 10 years: Yes If all of the above answers are "NO", then may proceed with Cephalosporin use. Has patient had a PCN reaction causing immediate rash, facial/tongue/throat swelling, SOB or lightheadedness with hypotension: Yes Has patient had a PCN reaction causing severe rash involving mucus membranes or skin necrosis: No Has patient had a PCN reaction that required hospitalization No Has patient had a PCN reaction occurring within the last 10 years: Yes If all of the above answers are "NO", then may proceed with Cephalosporin use. Has patient had a PCN reaction causing immediate rash, facial/tongue/throat swelling, SOB or lightheadedness with hypotension: Yes Has patient had a PCN reaction causing severe rash involving mucus membranes or skin necrosis: No Has patient had a PCN reaction that required hospitalization No Has patient had a PCN reaction occurring within the last 10 years: Yes If all of the above answers are "NO", then may proceed with Cephalosporin use.   Codeine Itching   Tolerates hydrocodone   Ezetimibe    Livalo [pitavastatin]    Statins Other (See Comments)   Muscle skeletal side effects   Welchol [colesevelam]    Gi se      Medication List       Accurate as of April 06, 2021  6:13 PM. If you have any questions, ask your nurse or doctor.        STOP taking these medications   FreeStyle Libre 2 Sensor Misc Stopped by: Howard Pouch, DO     TAKE these  medications   ALPRAZolam 0.25 MG tablet Commonly known as: XANAX Take 1 tablet (0.25 mg total) by mouth daily as needed for anxiety.   Basaglar KwikPen 100 UNIT/ML Inject 30 Units into  the skin 2 (two) times daily.   Biotin 1000 MCG tablet Take 1,000 mcg by mouth daily.   dabigatran 150 MG Caps capsule Commonly known as: PRADAXA Take 1 capsule (150 mg total) by mouth 2 (two) times daily.   Dexcom G6 Receiver Kerrin Mo See admin instructions.   Dexcom G6 Transmitter Misc See admin instructions.   EpiPen 2-Pak 0.3 mg/0.3 mL Soaj injection Generic drug: EPINEPHrine Inject 0.3 mg into the skin as needed. Bee stings   fluticasone 50 MCG/ACT nasal spray Commonly known as: FLONASE Place 2 sprays into both nostrils daily. What changed:   when to take this  reasons to take this   insulin aspart 100 UNIT/ML injection Commonly known as: novoLOG Inject 35-45 Units into the skin 3 (three) times daily with meals.   levothyroxine 50 MCG tablet Commonly known as: SYNTHROID Take 1 tablet (50 mcg total) by mouth daily before breakfast.   metoprolol tartrate 50 MG tablet Commonly known as: LOPRESSOR Take 1 tablet (50 mg total) by mouth 2 (two) times daily.   olmesartan-hydrochlorothiazide 40-25 MG tablet Commonly known as: BENICAR HCT Take 1 tablet by mouth daily.   OneTouch Delica Plus 0000000 Misc Apply 1 each topically 3 (three) times daily.   OneTouch Ultra test strip Generic drug: glucose blood SMARTSIG:Via Meter   TART CHERRY PO Take 1 tablet by mouth.   triamcinolone ointment 0.5 % Commonly known as: KENALOG Apply topically 2 (two) times daily as needed.   zolpidem 5 MG tablet Commonly known as: AMBIEN Take 1 tablet (5 mg total) by mouth at bedtime as needed for sleep. Started by: Howard Pouch, DO       All past medical history, surgical history, allergies, family history, immunizations andmedications were updated in the EMR today and reviewed under the  history and medication portions of their EMR.     ROS: 14 pt review of systems performed and negative (unless mentioned in an HPI)  Objective: BP 122/80   Pulse 81   Temp 98.1 F (36.7 C) (Oral)   Ht 5\' 8"  (1.727 m)   Wt 285 lb (129.3 kg)   SpO2 97%   BMI 43.33 kg/m  Gen: Afebrile. No acute distress.  Female. HENT: AT. [place].  Eyes:Pupils Equal Round Reactive to light, Extraocular movements intact,  Conjunctiva without redness, discharge or icterus. CV: RRR no murmur Chest: CTAB, no wheeze or crackles Neuro:  Normal gait. PERLA. EOMi. Alert. Oriented x3 Psych: Normal affect, dress and demeanor. Normal speech. Normal thought content and judgment.   Assessment/plan: Ann [name] is a 71 y.o. female present for [facility] Anxiety/sleep disturbance Lengthy discussion with today surrounding her medications surrounding her sleep disturbance and anxiety. Continue Xanax 0.25 mg daily daily as needed.  She rarely uses this medicine using approximately 12 in a year last year.  She uses medications appropriately and understands risks of drug class. We will restart Ambien at the 5 mg dose for her.  She understands 5 mg is the maximum recommended dose for those in her age bracket.  If for some reason her Medicare plan will not cover this week can consider GoodRx prescription since generic Ambien is rather inexpensive. Patient understands State regulations on controlled substances and [facility] policy on prescribing controlled substances.  Contract updated today [place] controlled substance database reviewed 04/06/21 Patient aware a face-to-face follow-up will be needed if she would need refills after 6 months.  Patient aware she will need to request these scripts they will not be automatically  refilled since they are controlled substances.  No follow-ups on file.  No orders of the defined types were placed in this encounter.  Meds ordered this encounter  Medications  . ALPRAZolam  (XANAX) 0.25 MG tablet    Sig: Take 1 tablet (0.25 mg total) by mouth daily as needed for anxiety.    Dispense:  30 tablet    Refill:  1  . zolpidem (AMBIEN) 5 MG tablet    Sig: Take 1 tablet (5 mg total) by mouth at bedtime as needed for sleep.    Dispense:  30 tablet    Refill:  5   Referral Orders  No referral(s) requested today     Note is dictated utilizing voice recognition software. Although note has been proof read prior to signing, occasional typographical errors still can be missed. If any questions arise, please do not hesitate to call for verification.  Electronically signed by: Howard Pouch, DO [place]

## 2021-04-06 NOTE — Patient Instructions (Addendum)
  I have refilled your Ambien 5 mg and xanax.  If needing additional refills, more than provided, must follow up face to face in 6 months.  We can fill these during your usual 5-6 month follow up- let us know if needed during that visit.

## 2021-04-07 ENCOUNTER — Telehealth: Payer: Self-pay | Admitting: Endocrinology

## 2021-04-07 NOTE — Telephone Encounter (Signed)
Pt would like a call back from nurse.

## 2021-04-07 NOTE — Telephone Encounter (Signed)
LVM for pt to cb with any questions/concerns and I have also sent message thru MyChart along with Ellison's orders to D/C Trulicity.

## 2021-04-07 NOTE — Telephone Encounter (Signed)
Pt calling to F/u on last message  

## 2021-04-27 ENCOUNTER — Ambulatory Visit: Payer: Medicare Other | Admitting: Cardiology

## 2021-04-27 ENCOUNTER — Other Ambulatory Visit: Payer: Self-pay

## 2021-04-27 ENCOUNTER — Telehealth: Payer: Self-pay | Admitting: Endocrinology

## 2021-04-27 ENCOUNTER — Telehealth: Payer: Self-pay

## 2021-04-27 ENCOUNTER — Encounter: Payer: Self-pay | Admitting: Cardiology

## 2021-04-27 VITALS — BP 116/74 | HR 81 | Temp 98.0°F | Resp 16 | Ht 68.0 in | Wt 284.0 lb

## 2021-04-27 DIAGNOSIS — R0989 Other specified symptoms and signs involving the circulatory and respiratory systems: Secondary | ICD-10-CM | POA: Diagnosis not present

## 2021-04-27 DIAGNOSIS — I4821 Permanent atrial fibrillation: Secondary | ICD-10-CM | POA: Diagnosis not present

## 2021-04-27 DIAGNOSIS — I10 Essential (primary) hypertension: Secondary | ICD-10-CM

## 2021-04-27 DIAGNOSIS — E1165 Type 2 diabetes mellitus with hyperglycemia: Secondary | ICD-10-CM

## 2021-04-27 DIAGNOSIS — Z789 Other specified health status: Secondary | ICD-10-CM | POA: Diagnosis not present

## 2021-04-27 DIAGNOSIS — R0609 Other forms of dyspnea: Secondary | ICD-10-CM

## 2021-04-27 DIAGNOSIS — Z794 Long term (current) use of insulin: Secondary | ICD-10-CM | POA: Diagnosis not present

## 2021-04-27 DIAGNOSIS — M1712 Unilateral primary osteoarthritis, left knee: Secondary | ICD-10-CM | POA: Diagnosis not present

## 2021-04-27 DIAGNOSIS — E782 Mixed hyperlipidemia: Secondary | ICD-10-CM | POA: Diagnosis not present

## 2021-04-27 DIAGNOSIS — R0600 Dyspnea, unspecified: Secondary | ICD-10-CM

## 2021-04-27 DIAGNOSIS — Z6841 Body Mass Index (BMI) 40.0 and over, adult: Secondary | ICD-10-CM | POA: Diagnosis not present

## 2021-04-27 MED ORDER — DICLOFENAC SODIUM 1 % EX GEL: 4.0000 g | Freq: Four times a day (QID) | CUTANEOUS | 2 refills | Status: DC

## 2021-04-27 NOTE — Progress Notes (Signed)
Message sent thru MyChart 

## 2021-04-27 NOTE — Progress Notes (Addendum)
Primary Physician/Referring:  Ma Hillock, DO  Patient ID: Ann [name], female    DOB: October 29, 1951, 70 y.o.   MRN: [id number]  Chief Complaint  Patient presents with  . Atrial Fibrillation  . Follow-up    (New patient visit) last seen 2019   HPI:    Ann [name]  is a 70 y.o.  Caucasian female patient with permanent atrial fibrillation, uncontrolled diabetes, hypertension, morbid obesity with OSA unable to tolerate CPAP, hyperlipidemia, unable to tolerate statins.  She was also recommended sleep apnea evaluation, however patient did not want to try nocturnal oximetry even.  I had last seen her 3 years ago, she now presents to reestablish care and states that she has got marked dyspnea on exertion even walking in her driveway.  She also states that she has developed severe degenerative joint disease involving the left knee and has difficulty taking NSAIDs due to patient being on Pradaxa and bleeding risk.  She has also gained weight as she has not been able to exercise.  She has not had any chest pain.  Past Medical History:  Diagnosis Date  . Arthritis    osteoarthritis- knees.  . Atrial fibrillation ([place])    Dr. Marcello Moores status post cardioversion chronic anticoagulation  . Cellulitis of right lower extremity 12/30/2020  . Chicken pox   . Colon cancer ([place]) 2001   Radiation, chemo.  Adenocarcinoma of the colon.  Followed by Dr. Derrel Nip  . Complication of anesthesia    sensitive to narcotics"extra sleepy". Versed- "no memory recall for 4 days postop"  . Diabetes mellitus without complication (HCC)    oral and Insulin  . Hepatitis    "sub clinical case of Hepatitis in late 70's""has immunities"  . Hypertension   . Hypothyroidism   . Laceration of head 08/04/2019   8 cm laceration forehead/scalp after tripping when mowing her lawn.  . Melanoma ([place])    wide excision near scapula  . Mixed hyperlipidemia   . 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  . Ovarian teratoma, right 09/2000   Oophorectomy  . Sleep apnea    unable to tolreate cpap  . Wisdom teeth extracted    Past Surgical History:  Procedure Laterality Date  . ABDOMINAL HYSTERECTOMY  09/2000    Total hysterectomy and oophorectomy  . ABDOMINAL WOUND DEHISCENCE Bilateral    multiple times / mesh use with multiple abdominal hernia repair  . CARDIOVERSION  2014   Unsuccessful.  Patient reports cardiac ablation was not felt to be appropriate next step by cardiology.  . CHOLECYSTECTOMY  04/1998   '98 -Lapraroscopic  . COLON RESECTION  2001   "colon cancer"  . FOOT SURGERY Bilateral    multiple "hammer toes and bunionectomies"  . KNEE ARTHROSCOPY Left 08/2006   meniscectomy  . MELANOMA EXCISION     near scapula  . TOTAL KNEE ARTHROPLASTY Right 01/24/2016   Procedure: RIGHT TOTAL KNEE ARTHROPLASTY;  Surgeon: Gaynelle Arabian, MD;  Location: [facility];  Service: Orthopedics;  Laterality: Right;   Family History  Problem Relation Age of Onset  . Arthritis Mother   . Colon cancer Mother   . Diabetes Mother   . Atrial fibrillation Mother   . Diabetes Father   . Heart attack Father   . Heart disease Father   . Stroke Father   . Hypertension Maternal Grandmother   . Stroke Maternal Grandmother   . Stroke Maternal Grandfather   .  Hypertension Paternal Grandmother   . Stroke Paternal Grandmother   . Diabetes Paternal Grandfather   . Hypertension Paternal Grandfather   . Stroke Paternal Grandfather   . Diabetes Brother   . Brain cancer Brother   . Heart disease Brother   . Hypertension Brother   . Stroke Brother     Social History   Tobacco Use  . Smoking status: Former Smoker    Years: 25.00    Types: Cigarettes    Quit date: 01/17/1997    Years since quitting: 24.2  . Smokeless tobacco: Never Used  Substance Use Topics  . Alcohol use: No   Marital Status: Single  ROS  Review of Systems  Constitutional: Positive for weight gain.  Cardiovascular:  Positive for dyspnea on exertion. Negative for chest pain and leg swelling.  Musculoskeletal: Positive for arthritis.  Gastrointestinal: Negative for melena.   Objective  Blood pressure 116/74, pulse 81, temperature 98 F (36.7 C), temperature source Temporal, resp. rate 16, height 5\' 8"  (1.727 m), weight 284 lb (128.8 kg), SpO2 96 %. Body mass index is 43.18 kg/m.  Vitals with BMI 04/27/2021 04/06/2021 04/06/2021  Height 5\' 8"  - 5\' 8"   Weight 284 lbs - 285 lbs  BMI 10.62 - 69.48  Systolic 546 270 350  Diastolic 74 80 76  Pulse 81 - 81     Physical Exam Constitutional:      Appearance: She is morbidly obese.  HENT:     Head: Atraumatic.  Neck:     Vascular: Carotid bruit (right carotid bruit) present.  Cardiovascular:     Rate and Rhythm: Normal rate. Rhythm irregular.     Pulses: Normal pulses and intact distal pulses.     Heart sounds: No murmur heard. No gallop. No S3 or S4 sounds.   Pulmonary:     Effort: Pulmonary effort is normal.     Breath sounds: Normal breath sounds.  Abdominal:     General: Bowel sounds are normal.     Palpations: Abdomen is soft.  Musculoskeletal:        General: No swelling.    Laboratory examination:   Recent Labs    07/23/20 1153 08/23/20 1335 03/18/21 1153  NA 137 135 136  K 3.9 3.9 4.2  CL 97 98 98  CO2 27 27 29   GLUCOSE 179* 199* 164*  BUN 27* 29* 17  CREATININE 0.98 1.09 0.89  CALCIUM 9.5 9.2 8.9   CrCl cannot be calculated (Patient's most recent lab result is older than the maximum 21 days allowed.).  CMP Latest Ref Rng & Units 03/18/2021 08/23/2020 07/23/2020  Glucose 70 - 99 mg/dL 164(H) 199(H) 179(H)  BUN 6 - 23 mg/dL 17 29(H) 27(H)  Creatinine 0.40 - 1.20 mg/dL 0.89 1.09 0.98  Sodium 135 - 145 mEq/L 136 135 137  Potassium 3.5 - 5.1 mEq/L 4.2 3.9 3.9  Chloride 96 - 112 mEq/L 98 98 97  CO2 19 - 32 mEq/L 29 27 27   Calcium 8.4 - 10.5 mg/dL 8.9 9.2 9.5  Total Protein 6.0 - 8.3 g/dL 6.9 - -  Total Bilirubin 0.2 - 1.2 mg/dL  1.4(H) - -  Alkaline Phos 39 - 117 U/L 73 - -  AST 0 - 37 U/L 19 - -  ALT 0 - 35 U/L 16 - -   CBC Latest Ref Rng & Units 03/18/2021 04/20/2020 01/26/2016  WBC 4.0 - 10.5 K/uL 9.0 8.7 9.9  Hemoglobin 12.0 - 15.0 g/dL 15.1(H) 14.8 12.1  Hematocrit 36.0 -  46.0 % 45.0 43.8 36.1  Platelets 150.0 - 400.0 K/uL 175.0 141.0(L) 188   Lipid Panel No results for input(s): CHOL, TRIG, LDLCALC, VLDL, HDL, CHOLHDL, LDLDIRECT in the last 8760 hours. Lipid Panel     Component Value Date/Time   CHOL 253 (H) 04/20/2020 1053   TRIG 236.0 (H) 04/20/2020 1053   HDL 39.10 04/20/2020 1053   CHOLHDL 6 04/20/2020 1053   VLDL 47.2 (H) 04/20/2020 1053   LDLCALC 160 04/02/2019 0000   LDLDIRECT 163.0 04/20/2020 1053    HEMOGLOBIN A1C Lab Results  Component Value Date   HGBA1C 7.9 (A) 03/17/2021   TSH Recent Labs    03/18/21 1153  TSH 2.85    Medications and allergies   Allergies  Allergen Reactions  . Amoxicillin Shortness Of Breath, Swelling and Anaphylaxis    Has patient had a PCN reaction causing immediate rash, facial/tongue/throat swelling, SOB or lightheadedness with hypotension: Yes Has patient had a PCN reaction causing severe rash involving mucus membranes or skin necrosis: No Has patient had a PCN reaction that required hospitalization No Has patient had a PCN reaction occurring within the last 10 years: Yes If all of the above answers are "NO", then may proceed with Cephalosporin use.  Has patient had a PCN reaction causing immediate rash, facial/tongue/throat swelling, SOB or lightheadedness with hypotension: Yes Has patient had a PCN reaction causing severe rash involving mucus membranes or skin necrosis: No Has patient had a PCN reaction that required hospitalization No Has patient had a PCN reaction occurring within the last 10 years: Yes If all of the above answers are "NO", then may proceed with Cephalosporin use. Has patient had a PCN reaction causing immediate rash,  facial/tongue/throat swelling, SOB or lightheadedness with hypotension: Yes Has patient had a PCN reaction causing severe rash involving mucus membranes or skin necrosis: No Has patient had a PCN reaction that required hospitalization No Has patient had a PCN reaction occurring within the last 10 years: Yes If all of the above answers are "NO", then may proceed with Cephalosporin use.   . Codeine Itching    Tolerates hydrocodone  . Ezetimibe   . Livalo [Pitavastatin]   . Statins Other (See Comments)    Muscle skeletal side effects  . Welchol [Colesevelam]     Gi se    Current Outpatient Medications on File Prior to Visit  Medication Sig Dispense Refill  . ALPRAZolam (XANAX) 0.25 MG tablet Take 1 tablet (0.25 mg total) by mouth daily as needed for anxiety. 30 tablet 1  . Biotin 1000 MCG tablet Take 1,000 mcg by mouth daily.    . Continuous Blood Gluc Receiver (DEXCOM G6 RECEIVER) [place] See admin instructions.    . Continuous Blood Gluc Transmit (DEXCOM G6 TRANSMITTER) MISC See admin instructions.    . dabigatran (PRADAXA) 150 MG CAPS capsule Take 1 capsule (150 mg total) by mouth 2 (two) times daily. 60 capsule 11  . EPIPEN 2-PAK 0.3 MG/0.3ML SOAJ injection Inject 0.3 mg into the skin as needed. Bee stings  1  . fluticasone (FLONASE) 50 MCG/ACT nasal spray Place 2 sprays into both nostrils daily. (Patient taking differently: Place 2 sprays into both nostrils daily as needed.) 16 g 6  . insulin aspart (NOVOLOG) 100 UNIT/ML injection Inject 35-45 Units into the skin 3 (three) times daily with meals. 160 mL 3  . Insulin Glargine (BASAGLAR KWIKPEN) 100 UNIT/ML Inject 30 Units into the skin 2 (two) times daily. 45 mL 3  . Lancets [facility]  DELICA PLUS LANCET33G) MISC Apply 1 each topically 3 (three) times daily.    Marland Kitchen levothyroxine (SYNTHROID) 50 MCG tablet Take 1 tablet (50 mcg total) by mouth daily before breakfast. 90 tablet 3  . metoprolol tartrate (LOPRESSOR) 50 MG tablet Take 1 tablet (50  mg total) by mouth 2 (two) times daily. 180 tablet 1  . olmesartan-hydrochlorothiazide (BENICAR HCT) 40-25 MG tablet Take 1 tablet by mouth daily. 90 tablet 1  . ONETOUCH ULTRA test strip SMARTSIG:Via Meter    . TART CHERRY PO Take 1 tablet by mouth.    . triamcinolone ointment (KENALOG) 0.5 % Apply topically 2 (two) times daily as needed.     No current facility-administered medications on file prior to visit.     Radiology:   No results found.  Cardiac Studies:   Echocardiogram 07/30/2018: Left ventricle cavity is normal in size. Moderate concentric hypertrophy of the left ventricle. Normal global wall motion. Unable to evaluate diastolic function due to A. Fibrillation. Calculated EF 55%. Left atrial cavity is moderately dilated. Right atrial cavity is mildly dilated. Moderate (Grade II) mitral regurgitation. Moderate tricuspid regurgitation. Mild pulmonary hypertension. Estimated pulmonary artery systolic pressure 35 mmHg. IVC is dilated with blunted respiratory response. Estimated RA pressure 10-15 mmHg.   EKG:     EKG 04/27/2021: Atrial fibrillation with controlled ventricular response at rate of 71 beats minute, normal axis, poor R progression, cannot exclude anteroseptal infarct old.  No evidence of ischemia.    Assessment     ICD-10-CM   1. Permanent atrial fibrillation (HCC)  I48.21 EKG 12-Lead    PCV ECHOCARDIOGRAM COMPLETE    PCV MYOCARDIAL PERFUSION WITH LEXISCAN  2. Primary hypertension  I10   3. Mixed hyperlipidemia  E78.2   4. Type 2 diabetes mellitus with hyperglycemia, with long-term current use of insulin (HCC)  E11.65 PCV MYOCARDIAL PERFUSION WITH LEXISCAN   Z79.4   5. Class 3 severe obesity due to excess calories with serious comorbidity and body mass index (BMI) of 40.0 to 44.9 in adult (HCC)  E66.01    Z68.41   6. Dyspnea on exertion  R06.00 PCV ECHOCARDIOGRAM COMPLETE    PCV MYOCARDIAL PERFUSION WITH LEXISCAN  7. Primary osteoarthritis of left knee   M17.12 diclofenac Sodium (VOLTAREN) 1 % GEL  8. Statin intolerance  Z78.9   9. Right carotid bruit  R09.89 PCV CAROTID DUPLEX (BILATERAL)     Medications Discontinued During This Encounter  Medication Reason  . zolpidem (AMBIEN) 5 MG tablet Error    Meds ordered this encounter  Medications  . diclofenac Sodium (VOLTAREN) 1 % GEL    Sig: Apply 4 g topically 4 (four) times daily.    Dispense:  4 g    Refill:  2   Orders Placed This Encounter  Procedures  . PCV MYOCARDIAL PERFUSION WITH LEXISCAN    Standing Status:   Future    Standing Expiration Date:   06/27/2021  . EKG 12-Lead  . PCV ECHOCARDIOGRAM COMPLETE    Standing Status:   Future    Standing Expiration Date:   04/27/2022   Recommendations:   Ann [name] is a 70 y.o. Caucasian female patient with permanent atrial fibrillation, uncontrolled diabetes, hypertension, morbid obesity with OSA unable to tolerate CPAP, hyperlipidemia, unable to tolerate statins.  She was also recommended sleep apnea evaluation, however patient did not want to try nocturnal oximetry even.  I had last seen her 3 to 4 years ago, she now presents to reestablish care and  states that she has got marked dyspnea on exertion even walking in her driveway.  Dyspnea on exertion is probably related to deconditioning and obesity, she has not been able to exercise due to severe degenerative joint disease especially involving her left knee.  Physical examination does not reveal any evidence of congestive heart failure, EKG does not reveal any ischemia although she does have permanent atrial fibrillation.  As she has not had any ischemic work-up in >almost 10 years, I will schedule her for a Lexiscan nuclear stress test.  She also needs an echocardiogram in view of atrial fibrillation, dyspnea and also moderate mitral regurgitation noted previously.  Schedule for carotid duplex for bruit/follow-up surveillance of carotid stenosis.   Regard to uncontrolled  diabetes mellitus, obesity, Victoza at supratherapeutic doses up to 3 mg may help with both weight loss and diabetes control, will discuss with Dr. Renato Shin to see if this would be appropriate.  If she is started on Victoza, I would either discontinue Benicar HCT as she can easily develop hypotension and depending on that we can make changes to her medications.  Would continue metoprolol as she has underlying A. fib with occasional episodes of palpitations related to A. fib with RVR.   She discontinued Trulicity as she had urgent continence. She is presently on appropriate anticoagulation in view of high CHA2DS2-VASc.  With regard to hyperlipidemia, she has not been able to tolerate any statins and has performed muscle weakness.  We will try to implement Repatha.  I will try to address this on her next office visit.  With regard to degenerative joint disease, she occasionally stops using Pradaxa and takes Aleve** for a day.  I would like her to try Voltaren gel.  She has been evaluated by orthopedics, due to her weight and uncontrolled diabetes she has not been felt to be a good candidate.   Office visit in 6 to 8 weeks for follow-up.  This was a >60-minute office visit encounter in addressing multiple medical issues and evaluation of external records and labs.   Adrian Prows, MD, [facility] 04/27/2021, 6:27 PM Office: [phone]  CC: Renato Shin, MD

## 2021-04-27 NOTE — Telephone Encounter (Signed)
Dr Einar Gip asked patient to relay message to have Dr Loanne Drilling review a note that he sent regarding this patient

## 2021-04-27 NOTE — Telephone Encounter (Signed)
Put in front desk for patient

## 2021-04-27 NOTE — Telephone Encounter (Signed)
Called and advised pt pen needles (4 boxes) have been delivered and are ready for pick up.

## 2021-04-27 NOTE — Telephone Encounter (Signed)
Pt calling in stating that [facility] got in contact with her, on may the 5th stating that they had shipped novofine plus 32g needles for pt. verified with nurses if it was okay to let her get a box.. pt states she will pick up this one box

## 2021-04-29 NOTE — Telephone Encounter (Signed)
Pt from

## 2021-05-18 ENCOUNTER — Telehealth: Payer: Self-pay

## 2021-05-18 NOTE — Telephone Encounter (Signed)
If she has COVID, then recommendations are to wait 3 months before booster.  The only way to know for sure is for her to take a test.  If the test is negative, then she can go of her booster now.

## 2021-05-18 NOTE — Telephone Encounter (Signed)
LVM for pt to CB regarding booster questions

## 2021-05-18 NOTE — Telephone Encounter (Signed)
Patient would like to get [facility] Booster that is now available at her local pharmacy, however, patient was exposed to [place] over the weekend. She does not have any symptoms.   Can she get booster or does she need to wait?  Please call [phone]

## 2021-05-19 NOTE — Telephone Encounter (Signed)
LVM for pt to CB regarding booster questions

## 2021-05-19 NOTE — Telephone Encounter (Signed)
Spoke with pt and informed her of providers instructions.

## 2021-05-23 DIAGNOSIS — N763 Subacute and chronic vulvitis: Secondary | ICD-10-CM | POA: Diagnosis not present

## 2021-05-23 DIAGNOSIS — L900 Lichen sclerosus et atrophicus: Secondary | ICD-10-CM | POA: Diagnosis not present

## 2021-05-25 ENCOUNTER — Other Ambulatory Visit: Payer: Self-pay

## 2021-05-25 ENCOUNTER — Ambulatory Visit: Payer: Medicare Other

## 2021-05-25 DIAGNOSIS — R0609 Other forms of dyspnea: Secondary | ICD-10-CM | POA: Diagnosis not present

## 2021-05-25 DIAGNOSIS — I4821 Permanent atrial fibrillation: Secondary | ICD-10-CM

## 2021-05-25 DIAGNOSIS — E1165 Type 2 diabetes mellitus with hyperglycemia: Secondary | ICD-10-CM | POA: Diagnosis not present

## 2021-05-25 DIAGNOSIS — R0600 Dyspnea, unspecified: Secondary | ICD-10-CM

## 2021-05-25 DIAGNOSIS — Z794 Long term (current) use of insulin: Secondary | ICD-10-CM

## 2021-05-26 DIAGNOSIS — Z23 Encounter for immunization: Secondary | ICD-10-CM | POA: Diagnosis not present

## 2021-05-28 NOTE — Progress Notes (Signed)
Lexiscan Tetrofosmin stress test 05/25/2021: Lexiscan nuclear stress test performed using 1-day protocol. SPECT images show small sized, mild intensity, reversible perfusion defect in mid to basal inferolateral myocardium. Stress LVEF 73%. Low risk study.

## 2021-06-01 ENCOUNTER — Other Ambulatory Visit: Payer: Self-pay

## 2021-06-01 ENCOUNTER — Telehealth: Payer: Self-pay | Admitting: Endocrinology

## 2021-06-01 ENCOUNTER — Encounter: Payer: Self-pay | Admitting: Endocrinology

## 2021-06-01 ENCOUNTER — Ambulatory Visit: Payer: Medicare Other

## 2021-06-01 DIAGNOSIS — R0989 Other specified symptoms and signs involving the circulatory and respiratory systems: Secondary | ICD-10-CM | POA: Diagnosis not present

## 2021-06-01 NOTE — Telephone Encounter (Signed)
Patient requests to be called at ph# [phone] re: Patient states that since she stopped taking Trulicity her blood sugars have sky rocketed consistently between 250-540. Blood sugars were 540 this am, Patient then gave herself 36 units of Novolog insulin and blood sugars went down to 293 (approx. 3:00 pm today). Patient states she just gave herself another 26 units of Novolog.

## 2021-06-02 NOTE — Telephone Encounter (Signed)
Message sent thru MyChart 

## 2021-06-03 ENCOUNTER — Encounter: Payer: Self-pay | Admitting: Endocrinology

## 2021-06-07 ENCOUNTER — Encounter: Payer: Medicare Other | Attending: Endocrinology | Admitting: Nutrition

## 2021-06-07 ENCOUNTER — Other Ambulatory Visit: Payer: Self-pay

## 2021-06-07 DIAGNOSIS — E119 Type 2 diabetes mellitus without complications: Secondary | ICD-10-CM | POA: Diagnosis not present

## 2021-06-08 ENCOUNTER — Ambulatory Visit: Payer: Medicare Other | Admitting: Cardiology

## 2021-06-08 ENCOUNTER — Encounter: Payer: Self-pay | Admitting: Cardiology

## 2021-06-08 ENCOUNTER — Telehealth: Payer: Self-pay | Admitting: Family Medicine

## 2021-06-08 VITALS — BP 109/55 | HR 72 | Resp 16 | Ht 68.0 in | Wt 281.0 lb

## 2021-06-08 DIAGNOSIS — Z6841 Body Mass Index (BMI) 40.0 and over, adult: Secondary | ICD-10-CM

## 2021-06-08 DIAGNOSIS — R0609 Other forms of dyspnea: Secondary | ICD-10-CM

## 2021-06-08 DIAGNOSIS — I4821 Permanent atrial fibrillation: Secondary | ICD-10-CM | POA: Diagnosis not present

## 2021-06-08 DIAGNOSIS — R0600 Dyspnea, unspecified: Secondary | ICD-10-CM

## 2021-06-08 NOTE — Patient Instructions (Signed)
Apply a new sensor every 10 days Apply a new transmitter every 3 months. Call Dexcom help line if questions.

## 2021-06-08 NOTE — Progress Notes (Signed)
Patient is here to learn how to use the Dexcom CGM.  She prefers to use the reader.  She was shown how to set this up with date/time and we linked her transmitter to this.  She was shown how to do this as well as how to insert and attach the transmitter.  She inserted the sensor to her left upper outer arm and attached the transmitter.  She was strongly encouraged to read over the manual and to call the help number if sensor falls off, or if questions.  She agreed to do this and had no final questions.

## 2021-06-08 NOTE — Telephone Encounter (Signed)
Left message for patient to schedule Annual Wellness Visit.  Please schedule with Nurse Health Advisor Leroy Kennedy, RN at [facility].

## 2021-06-08 NOTE — Progress Notes (Signed)
Primary Physician/Referring:  Ma Hillock, DO  Patient ID: Ann [name], female    DOB: March 17, 1951, 70 y.o.   MRN: [id number]  Chief Complaint  Patient presents with   Permanent atrial fibrillation   Follow-up   Hypertension   HPI:    Ann [name]  is a 70 y.o.  Caucasian female patient with permanent atrial fibrillation, uncontrolled diabetes, hypertension, morbid obesity with OSA unable to tolerate CPAP, hyperlipidemia, unable to tolerate statins.  She was also recommended sleep apnea evaluation, however patient did not want to try nocturnal oxygen supplementation as well.  Due to dyspnea on exertion, I had seen her 6 weeks ago and ordered repeat echocardiogram, Lexiscan nuclear stress test and carotid artery duplex.  She now presents for follow-up.  No new changes in symptoms since last office visit.  Past Medical History:  Diagnosis Date   Arthritis    osteoarthritis- knees.   Atrial fibrillation ([place])    Dr. Marcello Moores status post cardioversion chronic anticoagulation   Cellulitis of right lower extremity 12/30/2020   Chicken pox    Colon cancer ([place]) 2001   Radiation, chemo.  Adenocarcinoma of the colon.  Followed by Dr. Derrel Nip   Complication of anesthesia    sensitive to narcotics"extra sleepy". Versed- "no memory recall for 4 days postop"   Diabetes mellitus without complication (HCC)    oral and Insulin   Hepatitis    "sub clinical case of Hepatitis in late 70's""has immunities"   Hypertension    Hypothyroidism    Laceration of head 08/04/2019   8 cm laceration forehead/scalp after tripping when mowing her lawn.   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   Ovarian teratoma, right 09/2000   Oophorectomy   Sleep apnea    unable to tolreate cpap   Wisdom teeth extracted    Past Surgical History:  Procedure Laterality Date   ABDOMINAL HYSTERECTOMY   09/2000    Total hysterectomy and oophorectomy   ABDOMINAL WOUND DEHISCENCE Bilateral    multiple times / mesh use with multiple abdominal hernia repair   CARDIOVERSION  2014   Unsuccessful.  Patient reports cardiac ablation was not felt to be appropriate next step by cardiology.   CHOLECYSTECTOMY  04/1998   '98 -[place]  2001   "colon cancer"   FOOT SURGERY Bilateral    multiple "hammer toes and bunionectomies"   KNEE ARTHROSCOPY Left 08/2006   meniscectomy   MELANOMA EXCISION     near scapula   TOTAL KNEE ARTHROPLASTY Right 01/24/2016   Procedure: RIGHT TOTAL KNEE ARTHROPLASTY;  Surgeon: Gaynelle Arabian, MD;  Location: [facility];  Service: Orthopedics;  Laterality: Right;   Family History  Problem Relation Age of Onset   Arthritis Mother    Colon cancer Mother    Diabetes Mother    Atrial fibrillation Mother    Diabetes Father    Heart attack Father    Heart disease Father    Stroke Father    Hypertension Maternal Grandmother    Stroke Maternal Grandmother    Stroke Maternal Grandfather    Hypertension Paternal Grandmother    Stroke Paternal Grandmother    Diabetes Paternal Grandfather    Hypertension Paternal Grandfather    Stroke Paternal Grandfather    Diabetes Brother    Brain cancer Brother    Heart disease Brother  Hypertension Brother    Stroke Brother     Social History   Tobacco Use   Smoking status: Former    Years: 25.00    Pack years: 0.00    Types: Cigarettes    Quit date: 01/17/1997    Years since quitting: 24.4   Smokeless tobacco: Never  Substance Use Topics   Alcohol use: No   Marital Status: Single  ROS  Review of Systems  Constitutional: Positive for weight gain.  Cardiovascular:  Positive for dyspnea on exertion. Negative for chest pain and leg swelling.  Musculoskeletal:  Positive for arthritis.  Gastrointestinal:  Negative for melena.  Objective  Blood pressure (!) 109/55, pulse 72, resp. rate 16, height 5\' 8"   (1.727 m), weight 281 lb (127.5 kg), SpO2 97 %. Body mass index is 42.73 kg/m.  Vitals with BMI 06/08/2021 04/27/2021 04/06/2021  Height 5\' 8"  5\' 8"  -  Weight 281 lbs 284 lbs -  BMI 19.37 90.24 -  Systolic 097 353 299  Diastolic 55 74 80  Pulse 72 81 -     Physical Exam Constitutional:      Appearance: She is morbidly obese.  HENT:     Head: Atraumatic.  Neck:     Vascular: Carotid bruit (right carotid bruit) present.  Cardiovascular:     Rate and Rhythm: Normal rate. Rhythm irregular.     Pulses: Normal pulses and intact distal pulses.     Heart sounds: No murmur heard.   No gallop. No S3 or S4 sounds.  Pulmonary:     Effort: Pulmonary effort is normal.     Breath sounds: Normal breath sounds.  Abdominal:     General: Bowel sounds are normal.     Palpations: Abdomen is soft.  Musculoskeletal:        General: No swelling.   Laboratory examination:   Recent Labs    07/23/20 1153 08/23/20 1335 03/18/21 1153  NA 137 135 136  K 3.9 3.9 4.2  CL 97 98 98  CO2 27 27 29   GLUCOSE 179* 199* 164*  BUN 27* 29* 17  CREATININE 0.98 1.09 0.89  CALCIUM 9.5 9.2 8.9   CrCl cannot be calculated (Patient's most recent lab result is older than the maximum 21 days allowed.).  CMP Latest Ref Rng & Units 03/18/2021 08/23/2020 07/23/2020  Glucose 70 - 99 mg/dL 164(H) 199(H) 179(H)  BUN 6 - 23 mg/dL 17 29(H) 27(H)  Creatinine 0.40 - 1.20 mg/dL 0.89 1.09 0.98  Sodium 135 - 145 mEq/L 136 135 137  Potassium 3.5 - 5.1 mEq/L 4.2 3.9 3.9  Chloride 96 - 112 mEq/L 98 98 97  CO2 19 - 32 mEq/L 29 27 27   Calcium 8.4 - 10.5 mg/dL 8.9 9.2 9.5  Total Protein 6.0 - 8.3 g/dL 6.9 - -  Total Bilirubin 0.2 - 1.2 mg/dL 1.4(H) - -  Alkaline Phos 39 - 117 U/L 73 - -  AST 0 - 37 U/L 19 - -  ALT 0 - 35 U/L 16 - -   CBC Latest Ref Rng & Units 03/18/2021 04/20/2020 01/26/2016  WBC 4.0 - 10.5 K/uL 9.0 8.7 9.9  Hemoglobin 12.0 - 15.0 g/dL 15.1(H) 14.8 12.1  Hematocrit 36.0 - 46.0 % 45.0 43.8 36.1  Platelets 150.0  - 400.0 K/uL 175.0 141.0(L) 188   Lipid Panel No results for input(s): CHOL, TRIG, LDLCALC, VLDL, HDL, CHOLHDL, LDLDIRECT in the last 8760 hours. Lipid Panel     Component Value Date/Time   CHOL 253 (  H) 04/20/2020 1053   TRIG 236.0 (H) 04/20/2020 1053   HDL 39.10 04/20/2020 1053   CHOLHDL 6 04/20/2020 1053   VLDL 47.2 (H) 04/20/2020 1053   LDLCALC 160 04/02/2019 0000   LDLDIRECT 163.0 04/20/2020 1053    HEMOGLOBIN A1C Lab Results  Component Value Date   HGBA1C 7.9 (A) 03/17/2021   TSH Recent Labs    03/18/21 1153  TSH 2.85    Medications and allergies   Allergies  Allergen Reactions   Amoxicillin Shortness Of Breath, Swelling and Anaphylaxis    Has patient had a PCN reaction causing immediate rash, facial/tongue/throat swelling, SOB or lightheadedness with hypotension: Yes Has patient had a PCN reaction causing severe rash involving mucus membranes or skin necrosis: No Has patient had a PCN reaction that required hospitalization No Has patient had a PCN reaction occurring within the last 10 years: Yes If all of the above answers are "NO", then may proceed with Cephalosporin use.  Has patient had a PCN reaction causing immediate rash, facial/tongue/throat swelling, SOB or lightheadedness with hypotension: Yes Has patient had a PCN reaction causing severe rash involving mucus membranes or skin necrosis: No Has patient had a PCN reaction that required hospitalization No Has patient had a PCN reaction occurring within the last 10 years: Yes If all of the above answers are "NO", then may proceed with Cephalosporin use. Has patient had a PCN reaction causing immediate rash, facial/tongue/throat swelling, SOB or lightheadedness with hypotension: Yes Has patient had a PCN reaction causing severe rash involving mucus membranes or skin necrosis: No Has patient had a PCN reaction that required hospitalization No Has patient had a PCN reaction occurring within the last 10 years:  Yes If all of the above answers are "NO", then may proceed with Cephalosporin use.    Codeine Itching    Tolerates hydrocodone   Ezetimibe    Livalo [Pitavastatin]    Statins Other (See Comments)    Muscle skeletal side effects   Welchol [Colesevelam]     Gi se    Current Outpatient Medications on File Prior to Visit  Medication Sig Dispense Refill   ALPRAZolam (XANAX) 0.25 MG tablet Take 1 tablet (0.25 mg total) by mouth daily as needed for anxiety. 30 tablet 1   Continuous Blood Gluc Receiver (DEXCOM G6 RECEIVER) DEVI See admin instructions.     Continuous Blood Gluc Transmit (DEXCOM G6 TRANSMITTER) MISC See admin instructions.     dabigatran (PRADAXA) 150 MG CAPS capsule Take 1 capsule (150 mg total) by mouth 2 (two) times daily. 60 capsule 11   EPIPEN 2-PAK 0.3 MG/0.3ML SOAJ injection Inject 0.3 mg into the skin as needed. Bee stings  1   fluticasone (FLONASE) 50 MCG/ACT nasal spray Place 2 sprays into both nostrils daily. (Patient taking differently: Place 2 sprays into both nostrils daily as needed.) 16 g 6   insulin aspart (NOVOLOG) 100 UNIT/ML injection Inject 35-45 Units into the skin 3 (three) times daily with meals. 160 mL 3   Insulin Glargine (BASAGLAR KWIKPEN) 100 UNIT/ML Inject 30 Units into the skin 2 (two) times daily. 45 mL 3   Lancets (ONETOUCH DELICA PLUS [id number]) MISC Apply 1 each topically 3 (three) times daily.     levothyroxine (SYNTHROID) 50 MCG tablet Take 1 tablet (50 mcg total) by mouth daily before breakfast. 90 tablet 3   metoprolol tartrate (LOPRESSOR) 50 MG tablet Take 1 tablet (50 mg total) by mouth 2 (two) times daily. 180 tablet 1   olmesartan-hydrochlorothiazide (  BENICAR HCT) 40-25 MG tablet Take 1 tablet by mouth daily. 90 tablet 1   ONETOUCH ULTRA test strip SMARTSIG:Via Meter     triamcinolone ointment (KENALOG) 0.5 % Apply topically 2 (two) times daily as needed.     No current facility-administered medications on file prior to visit.     Medications after today's encounter: Current Outpatient Medications  Medication Instructions   ALPRAZolam (XANAX) 0.25 mg, Oral, Daily PRN   Basaglar KwikPen 30 Units, Subcutaneous, 2 times daily   Continuous Blood Gluc Receiver (DEXCOM G6 RECEIVER) DEVI See admin instructions   Continuous Blood Gluc Transmit (DEXCOM G6 TRANSMITTER) MISC See admin instructions   dabigatran (PRADAXA) 150 mg, Oral, 2 times daily   EpiPen 2-Pak 0.3 mg, Subcutaneous, As needed, Bee stings   fluticasone (FLONASE) 50 MCG/ACT nasal spray 2 sprays, Each Nare, Daily   insulin aspart (NOVOLOG) 35-45 Units, Subcutaneous, 3 times daily with meals   Lancets (ONETOUCH DELICA PLUS [id number]) MISC 1 each, Topical, 3 times daily   levothyroxine (SYNTHROID) 50 mcg, Oral, Daily before breakfast   metoprolol tartrate (LOPRESSOR) 50 mg, Oral, 2 times daily   olmesartan-hydrochlorothiazide (BENICAR HCT) 40-25 MG tablet 1 tablet, Oral, Daily   ONETOUCH ULTRA test strip SMARTSIG:Via Meter   triamcinolone ointment (KENALOG) 0.5 % Topical, 2 times daily PRN    Radiology:   No results found.  Cardiac Studies:   Lexiscan Tetrofosmin stress test 05/25/2021: Lexiscan nuclear stress test performed using 1-day protocol. SPECT images show small sized, mild intensity, reversible perfusion defect in mid to basal inferolateral myocardium. Stress LVEF 73%. Low risk study.  Echocardiogram 05/25/2021: Left ventricle cavity is normal in size. Mild concentric hypertrophy of the left ventricle. Normal LV systolic function with visual EF 55-60%. Normal global wall motion. Unable to evaluate diastolic function due to atrial fibrillation. Left atrial cavity is moderately dilated. Mild tricuspid regurgitation. Estimated pulmonary artery systolic pressure 32 mmHg. No significant change compared to previous study in 2019.  Carotid artery duplex 06/01/2021: Duplex suggests stenosis in the right internal carotid artery (<50%).  Duplex suggests  stenosis in the right common carotid artery (<50%), probably minimal heterogenous plaque. Duplex suggests stenosis in the left internal carotid artery (<50%), probably minimal heterogenous plaque. Duplex suggests stenosis in the left external carotid artery (<50%). Antegrade right vertebral artery flow. Antegrade left vertebral artery flow. Follow up is appropriate if clinically indicated.   EKG:    EKG 04/27/2021: Atrial fibrillation with controlled ventricular response at rate of 71 beats minute, normal axis, poor R progression, cannot exclude anteroseptal infarct old.  No evidence of ischemia.    Assessment     ICD-10-CM   1. Dyspnea on exertion  R06.00 Pulmonary rehab therapeutic exercise    2. Permanent atrial fibrillation (HCC)  I48.21     3. Class 3 severe obesity due to excess calories with serious comorbidity and body mass index (BMI) of 40.0 to 44.9 in adult [facility])  E66.01    Z68.41        Medications Discontinued During This Encounter  Medication Reason   Biotin 1000 MCG tablet Error   diclofenac Sodium (VOLTAREN) 1 % GEL Error   TART CHERRY PO Error    No orders of the defined types were placed in this encounter.  Orders Placed This Encounter  Procedures   Pulmonary rehab therapeutic exercise    Standing Status:   Future    Standing Expiration Date:   06/08/2022   Recommendations:   Ann [name] is a  70 y.o. Caucasian female patient with permanent atrial fibrillation, uncontrolled diabetes, hypertension, morbid obesity with OSA unable to tolerate CPAP, hyperlipidemia, unable to tolerate statins.  She was also recommended sleep apnea evaluation, however patient did not want to try nocturnal oxygen supplementation as well.  Due to dyspnea on exertion, I had seen her 6 weeks ago and ordered repeat echocardiogram, Lexiscan nuclear stress test and carotid artery duplex.  She now presents for follow-up.  I reviewed the results of the stress test, low risk study  although ischemia in the inferior wall could not be excluded completely.  However she needs risk factor modification especially weight loss, her dyspnea can also be explained by morbid obesity, age-related deconditioning, sedentary lifestyle.  After discussions, she would like to do pulmonary rehab first and if symptoms do not improve to consider cardiac catheterization.  I also reviewed the carotid artery duplex, she has mild plaque bilaterally but no ICA stenosis.  Again primary prevention is indicated.  Patient does not want to be on a statin.  She prefers to be not on any medication and she will try her best to improve her diet and exercise.  With regard to atrial fibrillation, patient had questions whether her atrial fibrillation may be causing her dyspnea.  I have advised her that it certainly could however atrial fibrillation has been ongoing for many years and she has multiple reasons for dyspnea.  This includes but not limited to morbid obesity, hypertension, diabetes mellitus, age-related deconditioning, diastolic dysfunction.  I would like to see her back in 3 months to further discuss dyspnea and consideration for right and left heart catheterization.  This was a 40-minute office visit encounter.   Adrian Prows, MD, [facility] 06/09/2021, 6:43 PM Office: [phone]  CC: Renato Shin, MD

## 2021-06-15 ENCOUNTER — Telehealth: Payer: Self-pay

## 2021-06-15 NOTE — Telephone Encounter (Signed)
Pt has been notified that her Novolog(7 boxes) has arrived here at the office from Pt assistance Program

## 2021-06-16 ENCOUNTER — Ambulatory Visit: Payer: Medicare Other | Admitting: Dietician

## 2021-06-16 ENCOUNTER — Ambulatory Visit (INDEPENDENT_AMBULATORY_CARE_PROVIDER_SITE_OTHER): Payer: Medicare Other | Admitting: Endocrinology

## 2021-06-16 ENCOUNTER — Other Ambulatory Visit: Payer: Self-pay

## 2021-06-16 VITALS — BP 120/70 | HR 84 | Ht 68.0 in | Wt 285.8 lb

## 2021-06-16 DIAGNOSIS — E119 Type 2 diabetes mellitus without complications: Secondary | ICD-10-CM | POA: Diagnosis not present

## 2021-06-16 LAB — POCT GLYCOSYLATED HEMOGLOBIN (HGB A1C): Hemoglobin A1C: 8.8 % — AB (ref 4.0–5.6)

## 2021-06-16 MED ORDER — NOVOLOG FLEXPEN 100 UNIT/ML ~~LOC~~ SOPN: 45.0000 [IU] | PEN_INJECTOR | Freq: Three times a day (TID) | SUBCUTANEOUS | 3 refills | Status: DC

## 2021-06-16 MED ORDER — BASAGLAR KWIKPEN 100 UNIT/ML ~~LOC~~ SOPN: 25.0000 [IU] | PEN_INJECTOR | Freq: Two times a day (BID) | SUBCUTANEOUS | 3 refills | Status: DC

## 2021-06-16 NOTE — Progress Notes (Signed)
Patient seen today as a walk in.  She started her Dexcom 1 day prior to sensor expiring and the receiver did not ask for the new sensor number.  Problem solved.  Stopped current sensor.  Waited 5 minutes.  Patient started new sensor and entered new sensor code.  Dexcom is now in the 2 hour warm up.  She also states that she does not know why she requires so much insulin and is unable to control as she does not eat excessive carbohydrates.  She states that she likes a lot of meat.  Briefly discussed insulin resistance, what it is and treatment.  Discussed exercise and patient reports difficulty due to knee issues.  She has been referred to cardio pulmonary rehab at [facility].  Discussed armchair exercises on you tube as well.  Discussed benefits of decreased meat and choosing those lowest in saturated fat.  Card provided for patient to call for an appointment as desired.  Antonieta Iba, RD, LDN, CDCES

## 2021-06-16 NOTE — Patient Instructions (Addendum)
check your blood sugar twice a day.  vary the time of day when you check, between before the 3 meals, and at bedtime.  also check if you have symptoms of your blood sugar being too high or too low.  please keep a record of the readings and bring it to your next appointment here (or you can bring the meter itself).  You can write it on any piece of paper.  please call us sooner if your blood sugar goes below 70, or if you have a lot of readings over 200.   Please reduce the Basaglar to 25 units twice a day, and:  Increase the Novolog to 45-55 units 3 times a day (just before each meal).   Please come back for a follow-up appointment in 2 months.

## 2021-06-16 NOTE — Progress Notes (Signed)
Subjective:    Patient ID: Ann [name], female    DOB: Sep 25, 1951, 70 y.o.   MRN: [id number]  HPI Pt returns for f/u of diabetes mellitus:  DM type: Insulin-requiring type 2 Dx'ed: 8469 Complications: PAD.   Therapy: insulin since soon after dx.   GDM: never (G0) DKA: never Severe hypoglycemia: never Pancreatitis: never Pancreatic imaging: neg on 2017 CT SDOH: she gets meds from pt assist.   Other: she did not tolerate glipizide (flatulence), or Farxiga (vaginitis); She needs to lose weight, for TKR; she did not tolerate Trulicity (urinary retention) Interval history: I reviewed continuous glucose monitor data.  Glucose varies from 102-340.  It is in general highest 8-11PM, and lowest 6-29BM  Pt says Trulicity is causing difficulty with urination, so she stopped it 10 days ago.   Past Medical History:  Diagnosis Date   Arthritis    osteoarthritis- knees.   Atrial fibrillation ([place])    Dr. Marcello Moores status post cardioversion chronic anticoagulation   Cellulitis of right lower extremity 12/30/2020   Chicken pox    Colon cancer ([place]) 2001   Radiation, chemo.  Adenocarcinoma of the colon.  Followed by Dr. Derrel Nip   Complication of anesthesia    sensitive to narcotics"extra sleepy". Versed- "no memory recall for 4 days postop"   Diabetes mellitus without complication (HCC)    oral and Insulin   Hepatitis    "sub clinical case of Hepatitis in late 70's""has immunities"   Hypertension    Hypothyroidism    Laceration of head 08/04/2019   8 cm laceration forehead/scalp after tripping when mowing her lawn.   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   Ovarian teratoma, right 09/2000   Oophorectomy   Sleep apnea    unable to tolreate cpap   Wisdom teeth extracted     Past Surgical History:  Procedure Laterality Date   ABDOMINAL HYSTERECTOMY  09/2000    Total hysterectomy  and oophorectomy   ABDOMINAL WOUND DEHISCENCE Bilateral    multiple times / mesh use with multiple abdominal hernia repair   CARDIOVERSION  2014   Unsuccessful.  Patient reports cardiac ablation was not felt to be appropriate next step by cardiology.   CHOLECYSTECTOMY  04/1998   '98 -[place]  2001   "colon cancer"   FOOT SURGERY Bilateral    multiple "hammer toes and bunionectomies"   KNEE ARTHROSCOPY Left 08/2006   meniscectomy   MELANOMA EXCISION     near scapula   TOTAL KNEE ARTHROPLASTY Right 01/24/2016   Procedure: RIGHT TOTAL KNEE ARTHROPLASTY;  Surgeon: Gaynelle Arabian, MD;  Location: [facility];  Service: Orthopedics;  Laterality: Right;    Social History   Socioeconomic History   Marital status: Single    Spouse name: Not on file   Number of children: 0   Years of education: Not on file   Highest education level: Not on file  Occupational History   Not on file  Tobacco Use   Smoking status: Former    Years: 25.00    Pack years: 0.00    Types: Cigarettes    Quit date: 01/17/1997    Years since quitting: 24.4   Smokeless tobacco: Never  Vaping Use   Vaping Use: Never used  Substance and Sexual Activity   Alcohol use: No   Drug use: No  Sexual activity: Not Currently  Other Topics Concern   Not on file  Social History Narrative   Marital status/children/pets: Single. G0P0.   Education/employment: Retired Oceanographer:      -smoke alarm in the home:Yes     - wears seatbelt: Yes     - Feels safe in their relationships: Yes   Social Determinants of Health   Financial Resource Strain: Not on file  Food Insecurity: Not on file  Transportation Needs: Not on file  Physical Activity: Not on file  Stress: Not on file  Social Connections: Not on file  Intimate Partner Violence: Not on file    Current Outpatient Medications on File Prior to Visit  Medication Sig Dispense Refill   ALPRAZolam (XANAX) 0.25 MG  tablet Take 1 tablet (0.25 mg total) by mouth daily as needed for anxiety. 30 tablet 1   Continuous Blood Gluc Receiver (DEXCOM G6 RECEIVER) DEVI See admin instructions.     Continuous Blood Gluc Transmit (DEXCOM G6 TRANSMITTER) MISC See admin instructions.     dabigatran (PRADAXA) 150 MG CAPS capsule Take 1 capsule (150 mg total) by mouth 2 (two) times daily. 60 capsule 11   EPIPEN 2-PAK 0.3 MG/0.3ML SOAJ injection Inject 0.3 mg into the skin as needed. Bee stings  1   fluticasone (FLONASE) 50 MCG/ACT nasal spray Place 2 sprays into both nostrils daily. (Patient taking differently: Place 2 sprays into both nostrils daily as needed.) 16 g 6   Lancets (ONETOUCH DELICA PLUS [id number]) MISC Apply 1 each topically 3 (three) times daily.     levothyroxine (SYNTHROID) 50 MCG tablet Take 1 tablet (50 mcg total) by mouth daily before breakfast. 90 tablet 3   metoprolol tartrate (LOPRESSOR) 50 MG tablet Take 1 tablet (50 mg total) by mouth 2 (two) times daily. 180 tablet 1   olmesartan-hydrochlorothiazide (BENICAR HCT) 40-25 MG tablet Take 1 tablet by mouth daily. 90 tablet 1   ONETOUCH ULTRA test strip SMARTSIG:Via Meter     triamcinolone ointment (KENALOG) 0.5 % Apply topically 2 (two) times daily as needed.     No current facility-administered medications on file prior to visit.    Allergies  Allergen Reactions   Amoxicillin Shortness Of Breath, Swelling and Anaphylaxis    Has patient had a PCN reaction causing immediate rash, facial/tongue/throat swelling, SOB or lightheadedness with hypotension: Yes Has patient had a PCN reaction causing severe rash involving mucus membranes or skin necrosis: No Has patient had a PCN reaction that required hospitalization No Has patient had a PCN reaction occurring within the last 10 years: Yes If all of the above answers are "NO", then may proceed with Cephalosporin use.  Has patient had a PCN reaction causing immediate rash, facial/tongue/throat swelling, SOB  or lightheadedness with hypotension: Yes Has patient had a PCN reaction causing severe rash involving mucus membranes or skin necrosis: No Has patient had a PCN reaction that required hospitalization No Has patient had a PCN reaction occurring within the last 10 years: Yes If all of the above answers are "NO", then may proceed with Cephalosporin use. Has patient had a PCN reaction causing immediate rash, facial/tongue/throat swelling, SOB or lightheadedness with hypotension: Yes Has patient had a PCN reaction causing severe rash involving mucus membranes or skin necrosis: No Has patient had a PCN reaction that required hospitalization No Has patient had a PCN reaction occurring within the last 10 years: Yes If all of the above answers are "NO", then may proceed  with Cephalosporin use.    Codeine Itching    Tolerates hydrocodone   Ezetimibe    Livalo [Pitavastatin]    Statins Other (See Comments)    Muscle skeletal side effects   Welchol [Colesevelam]     Gi se    Family History  Problem Relation Age of Onset   Arthritis Mother    Colon cancer Mother    Diabetes Mother    Atrial fibrillation Mother    Diabetes Father    Heart attack Father    Heart disease Father    Stroke Father    Hypertension Maternal Grandmother    Stroke Maternal Grandmother    Stroke Maternal Grandfather    Hypertension Paternal Grandmother    Stroke Paternal Grandmother    Diabetes Paternal Grandfather    Hypertension Paternal Grandfather    Stroke Paternal Grandfather    Diabetes Brother    Brain cancer Brother    Heart disease Brother    Hypertension Brother    Stroke Brother     BP 120/70 (BP Location: Right Arm, Patient Position: Sitting, Cuff Size: Large)   Pulse 84   Ht 5\' 8"  (1.727 m)   Wt 285 lb 12.8 oz (129.6 kg)   SpO2 95%   BMI 43.46 kg/m    Review of Systems     Objective:   Physical Exam Pulses: dorsalis pedis intact bilat.   MSK: no deformity of the feet.  CV: 2+ bilat  leg edema, and bilat vv's.   Skin: ulcer at the plantar aspect of the right great toe is almost completely healed.  there are bilat calluses.  normal color and temp on the feet.   Neuro: sensation is intact to touch on the feet.   A1c=8.8%     Assessment & Plan:  Insulin-requiring type 2 DM: uncontrolled.  difficulty with urination, due to Trulicity.  Pt will stay off it.   Patient Instructions  check your blood sugar twice a day.  vary the time of day when you check, between before the 3 meals, and at bedtime.  also check if you have symptoms of your blood sugar being too high or too low.  please keep a record of the readings and bring it to your next appointment here (or you can bring the meter itself).  You can write it on any piece of paper.  please call us sooner if your blood sugar goes below 70, or if you have a lot of readings over 200.   Please reduce the Basaglar to 25 units twice a day, and:  Increase the Novolog to 45-55 units 3 times a day (just before each meal).   Please come back for a follow-up appointment in 2 months.

## 2021-06-21 ENCOUNTER — Telehealth (INDEPENDENT_AMBULATORY_CARE_PROVIDER_SITE_OTHER): Payer: Medicare Other | Admitting: Family Medicine

## 2021-06-21 ENCOUNTER — Encounter: Payer: Self-pay | Admitting: Family Medicine

## 2021-06-21 DIAGNOSIS — L03115 Cellulitis of right lower limb: Secondary | ICD-10-CM | POA: Diagnosis not present

## 2021-06-21 MED ORDER — DOXYCYCLINE HYCLATE 100 MG PO TABS: 100.0000 mg | ORAL_TABLET | Freq: Two times a day (BID) | ORAL | 0 refills | Status: DC

## 2021-06-21 NOTE — Progress Notes (Signed)
VIRTUAL VISIT VIA VIDEO  I connected with Ann [name] on 06/21/21 at 11:30 AM EDT by elemedicine application and verified that I am speaking with the correct person using two identifiers. Location patient: Home Location provider: [facility], Office Persons participating in the virtual visit: Patient, Dr. Raoul Pitch and Darnell Level. Ann [name], CMA  I discussed the limitations of evaluation and management by telemedicine and the availability of in person appointments. The patient expressed understanding and agreed to proceed.   SUBJECTIVE Chief Complaint  Patient presents with   Rash    Pt c/o rash on R leg; described as red, itching, swelling x 1 day    HPI: Ann [name] is a 70 y.o. female present for redness, swelling and itchiness of right ankle. She does not believe she was exposed to plants/poison ivy etc. She reports she may have had bug bites- she did step in the mulch with that foot. She had an episode of cellulitis in that same location a couple months ago that had resolved with doxy bid. She denies any current drainage from the location. She did take a lasix 10 mg to help with swelling and has been placing cool compresses over location.   ROS: See pertinent positives and negatives per HPI.  Patient Active Problem List   Diagnosis Date Noted   Elevated bilirubin 03/22/2021   Gout 07/23/2020   Sleep disturbance 04/20/2020   Anxiety 04/20/2020   Atrial fibrillation (HCC)    Diabetes mellitus without complication (HCC)    Hypertension    Hypothyroidism    Mixed hyperlipidemia    Chronic vulvitis 11/13/2018   OA (osteoarthritis) of knee 01/24/2016   Mitral valve disease 08/16/2012    Social History   Tobacco Use   Smoking status: Former    Years: 25.00    Pack years: 0.00    Types: Cigarettes    Quit date: 01/17/1997    Years since quitting: 24.4   Smokeless tobacco: Never  Substance Use Topics   Alcohol use: No    Current Outpatient Medications:     ALPRAZolam (XANAX) 0.25 MG tablet, Take 1 tablet (0.25 mg total) by mouth daily as needed for anxiety., Disp: 30 tablet, Rfl: 1   Continuous Blood Gluc Receiver ([place]) DEVI, See admin instructions., Disp: , Rfl:    Continuous Blood Gluc Transmit (DEXCOM G6 TRANSMITTER) MISC, See admin instructions., Disp: , Rfl:    dabigatran (PRADAXA) 150 MG CAPS capsule, Take 1 capsule (150 mg total) by mouth 2 (two) times daily., Disp: 60 capsule, Rfl: 11   EPIPEN 2-PAK 0.3 MG/0.3ML SOAJ injection, Inject 0.3 mg into the skin as needed. Bee stings, Disp: , Rfl: 1   fluticasone (FLONASE) 50 MCG/ACT nasal spray, Place 2 sprays into both nostrils daily. (Patient taking differently: Place 2 sprays into both nostrils daily as needed.), Disp: 16 g, Rfl: 6   insulin aspart (NOVOLOG FLEXPEN) 100 UNIT/ML FlexPen, Inject 45-55 Units into the skin 3 (three) times daily with meals., Disp: 150 mL, Rfl: 3   Insulin Glargine (BASAGLAR KWIKPEN) 100 UNIT/ML, Inject 25 Units into the skin 2 (two) times daily., Disp: 45 mL, Rfl: 3   Lancets (ONETOUCH DELICA PLUS [id number]) MISC, Apply 1 each topically 3 (three) times daily., Disp: , Rfl:    levothyroxine (SYNTHROID) 50 MCG tablet, Take 1 tablet (50 mcg total) by mouth daily before breakfast., Disp: 90 tablet, Rfl: 3   metoprolol tartrate (LOPRESSOR) 50 MG tablet, Take 1 tablet (50 mg total) by  mouth 2 (two) times daily., Disp: 180 tablet, Rfl: 1   olmesartan-hydrochlorothiazide (BENICAR HCT) 40-25 MG tablet, Take 1 tablet by mouth daily., Disp: 90 tablet, Rfl: 1   ONETOUCH ULTRA test strip, SMARTSIG:Via Meter, Disp: , Rfl:    triamcinolone ointment (KENALOG) 0.5 %, Apply topically 2 (two) times daily as needed., Disp: , Rfl:    doxycycline (VIBRA-TABS) 100 MG tablet, Take 1 tablet (100 mg total) by mouth 2 (two) times daily., Disp: 20 tablet, Rfl: 0   zolpidem (AMBIEN) 5 MG tablet, Take 5 mg by mouth at bedtime as needed., Disp: , Rfl:   Allergies  Allergen Reactions    Amoxicillin Shortness Of Breath, Swelling and Anaphylaxis    Has patient had a PCN reaction causing immediate rash, facial/tongue/throat swelling, SOB or lightheadedness with hypotension: Yes Has patient had a PCN reaction causing severe rash involving mucus membranes or skin necrosis: No Has patient had a PCN reaction that required hospitalization No Has patient had a PCN reaction occurring within the last 10 years: Yes If all of the above answers are "NO", then may proceed with Cephalosporin use.  Has patient had a PCN reaction causing immediate rash, facial/tongue/throat swelling, SOB or lightheadedness with hypotension: Yes Has patient had a PCN reaction causing severe rash involving mucus membranes or skin necrosis: No Has patient had a PCN reaction that required hospitalization No Has patient had a PCN reaction occurring within the last 10 years: Yes If all of the above answers are "NO", then may proceed with Cephalosporin use. Has patient had a PCN reaction causing immediate rash, facial/tongue/throat swelling, SOB or lightheadedness with hypotension: Yes Has patient had a PCN reaction causing severe rash involving mucus membranes or skin necrosis: No Has patient had a PCN reaction that required hospitalization No Has patient had a PCN reaction occurring within the last 10 years: Yes If all of the above answers are "NO", then may proceed with Cephalosporin use.    Codeine Itching    Tolerates hydrocodone   Ezetimibe    Livalo [Pitavastatin]    Statins Other (See Comments)    Muscle skeletal side effects   Welchol [Colesevelam]     Gi se    OBJECTIVE: There were no vitals taken for this visit. Gen: No acute distress. Nontoxic in appearance.  HENT: AT. [place].  Eyes:Pupils Equal Round Reactive to light, Extraocular movements intact,  Conjunctiva without redness, discharge or icterus. Skin:  no purpura or petechiae.  Neuro: Alert. Oriented x3    ASSESSMENT AND PLAN: Ann [name] is a 70 y.o. female present for  Cellulitis of right lower extremity Discussed options with her, agree this is possibly insect bites that are causing reoccurrence of her cellulitis.  Elected to treat with doxycycline twice daily x10 days.  She can continue to place cool compresses on for comfort. Make sure to keep area clean and dry, consider soaking in Epson salt soaks. Can use Benadryl spray to help with itching. - doxycycline (VIBRA-TABS) 100 MG tablet; Take 1 tablet (100 mg total) by mouth 2 (two) times daily.  Dispense: 20 tablet; Refill: 0 Follow-up in 1 week if not seeing improvement, sooner if worsening.   Howard Pouch, DO 06/21/2021   No follow-ups on file.  No orders of the defined types were placed in this encounter.  Meds ordered this encounter  Medications   doxycycline (VIBRA-TABS) 100 MG tablet    Sig: Take 1 tablet (100 mg total) by mouth 2 (two) times daily.  Dispense:  20 tablet    Refill:  0   Referral Orders  No referral(s) requested today

## 2021-06-22 ENCOUNTER — Ambulatory Visit (INDEPENDENT_AMBULATORY_CARE_PROVIDER_SITE_OTHER): Payer: Medicare Other | Admitting: *Deleted

## 2021-06-22 DIAGNOSIS — Z0000 Encounter for general adult medical examination without abnormal findings: Secondary | ICD-10-CM

## 2021-06-22 NOTE — Progress Notes (Signed)
Subjective:   Ann [name] is a 70 y.o. female who presents for Medicare Annual (Subsequent) preventive examination.  I connected with  [name] on 06/22/21 by a telephone enabled telemedicine application and verified that I am speaking with the correct person using two identifiers.   I discussed the limitations of evaluation and management by telemedicine. The patient expressed understanding and agreed to proceed.  Patient location: home  Provider location: Tele-health   I  Review of Systems    NA Cardiac Risk Factors include: advanced age (>57men, >68 women);diabetes mellitus;hypertension;family history of premature cardiovascular disease;obesity (BMI >30kg/m2)     Objective:    Today's Vitals   There is no height or weight on file to calculate BMI.  Advanced Directives 06/22/2021 01/24/2016 01/24/2016 01/18/2016  Does Patient Have a Medical Advance Directive? Yes Yes - Yes  Type of Advance Directive [place];Living will Healthcare Power of [place]  Does patient want to make changes to medical advance directive? - No - Patient declined - No - Patient declined  Copy of [place] in Chart? No - copy requested Yes (No Data) No - copy requested    Current Medications (verified) Outpatient Encounter Medications as of 06/22/2021  Medication Sig   ALPRAZolam (XANAX) 0.25 MG tablet Take 1 tablet (0.25 mg total) by mouth daily as needed for anxiety.   Continuous Blood Gluc Receiver ([place]) DEVI See admin instructions.   Continuous Blood Gluc Transmit (DEXCOM G6 TRANSMITTER) MISC See admin instructions.   dabigatran (PRADAXA) 150 MG CAPS capsule Take 1 capsule (150 mg total) by mouth 2 (two) times daily.   doxycycline (VIBRA-TABS) 100 MG tablet Take 1 tablet (100 mg total) by mouth 2 (two) times daily.   EPIPEN 2-PAK 0.3 MG/0.3ML SOAJ injection Inject 0.3 mg into  the skin as needed. Bee stings   fluticasone (FLONASE) 50 MCG/ACT nasal spray Place 2 sprays into both nostrils daily. (Patient taking differently: Place 2 sprays into both nostrils daily as needed.)   insulin aspart (NOVOLOG FLEXPEN) 100 UNIT/ML FlexPen Inject 45-55 Units into the skin 3 (three) times daily with meals.   Insulin Glargine (BASAGLAR KWIKPEN) 100 UNIT/ML Inject 25 Units into the skin 2 (two) times daily.   Lancets (ONETOUCH DELICA PLUS FOYDXA12I) MISC Apply 1 each topically 3 (three) times daily.   levothyroxine (SYNTHROID) 50 MCG tablet Take 1 tablet (50 mcg total) by mouth daily before breakfast.   metoprolol tartrate (LOPRESSOR) 50 MG tablet Take 1 tablet (50 mg total) by mouth 2 (two) times daily.   olmesartan-hydrochlorothiazide (BENICAR HCT) 40-25 MG tablet Take 1 tablet by mouth daily.   ONETOUCH ULTRA test strip SMARTSIG:Via Meter   triamcinolone ointment (KENALOG) 0.5 % Apply topically 2 (two) times daily as needed.   zolpidem (AMBIEN) 5 MG tablet Take 5 mg by mouth at bedtime as needed.   No facility-administered encounter medications on file as of 06/22/2021.    Allergies (verified) Amoxicillin, Codeine, Ezetimibe, Livalo [pitavastatin], Statins, and Welchol [colesevelam]   History: Past Medical History:  Diagnosis Date   Arthritis    osteoarthritis- knees.   Atrial fibrillation ([place])    Dr. Marcello Moores status post cardioversion chronic anticoagulation   Cellulitis of right lower extremity 12/30/2020   Chicken pox    Colon cancer ([place]) 2001   Radiation, chemo.  Adenocarcinoma of the colon.  Followed by Dr. Derrel Nip   Complication of anesthesia  sensitive to narcotics"extra sleepy". Versed- "no memory recall for 4 days postop"   Diabetes mellitus without complication (HCC)    oral and Insulin   Hepatitis    "sub clinical case of Hepatitis in late 70's""has immunities"   Hypertension    Hypothyroidism    Laceration of head 08/04/2019   8 cm laceration  forehead/scalp after tripping when mowing her lawn.   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   Ovarian teratoma, right 09/2000   Oophorectomy   Sleep apnea    unable to tolreate cpap   Wisdom teeth extracted    Past Surgical History:  Procedure Laterality Date   ABDOMINAL HYSTERECTOMY  09/2000    Total hysterectomy and oophorectomy   ABDOMINAL WOUND DEHISCENCE Bilateral    multiple times / mesh use with multiple abdominal hernia repair   CARDIOVERSION  2014   Unsuccessful.  Patient reports cardiac ablation was not felt to be appropriate next step by cardiology.   CHOLECYSTECTOMY  04/1998   '98 -[place]  2001   "colon cancer"   FOOT SURGERY Bilateral    multiple "hammer toes and bunionectomies"   KNEE ARTHROSCOPY Left 08/2006   meniscectomy   MELANOMA EXCISION     near scapula   TOTAL KNEE ARTHROPLASTY Right 01/24/2016   Procedure: RIGHT TOTAL KNEE ARTHROPLASTY;  Surgeon: Gaynelle Arabian, MD;  Location: [facility];  Service: Orthopedics;  Laterality: Right;   Family History  Problem Relation Age of Onset   Arthritis Mother    Colon cancer Mother    Diabetes Mother    Atrial fibrillation Mother    Diabetes Father    Heart attack Father    Heart disease Father    Stroke Father    Hypertension Maternal Grandmother    Stroke Maternal Grandmother    Stroke Maternal Grandfather    Hypertension Paternal Grandmother    Stroke Paternal Grandmother    Diabetes Paternal Grandfather    Hypertension Paternal Grandfather    Stroke Paternal Grandfather    Diabetes Brother    Brain cancer Brother    Heart disease Brother    Hypertension Brother    Stroke Brother    Social History   Socioeconomic History   Marital status: Single    Spouse name: Not on file   Number of children: 0   Years of education: Not on file   Highest education level: Not on  file  Occupational History   Not on file  Tobacco Use   Smoking status: Former    Years: 25.00    Pack years: 0.00    Types: Cigarettes    Quit date: 01/17/1997    Years since quitting: 24.4   Smokeless tobacco: Never  Vaping Use   Vaping Use: Never used  Substance and Sexual Activity   Alcohol use: No   Drug use: No   Sexual activity: Not Currently  Other Topics Concern   Not on file  Social History Narrative   Marital status/children/pets: Single. G0P0.   Education/employment: Retired Oceanographer:      -smoke alarm in the home:Yes     - wears seatbelt: Yes     - Feels safe in their relationships: Yes   Social Determinants of Health   Financial Resource Strain: Low Risk    Difficulty of Paying Living Expenses:  Not hard at all  Food Insecurity: No Food Insecurity   Worried About Charity fundraiser in the Last Year: Never true   Ran Out of Food in the Last Year: Never true  Transportation Needs: No Transportation Needs   Lack of Transportation (Medical): No   Lack of Transportation (Non-Medical): No  Physical Activity: Inactive   Days of Exercise per Week: 0 days   Minutes of Exercise per Session: 0 min  Stress: No Stress Concern Present   Feeling of Stress : Not at all  Social Connections: Moderately Integrated   Frequency of Communication with Friends and Family: More than three times a week   Frequency of Social Gatherings with Friends and Family: More than three times a week   Attends Religious Services: More than 4 times per year   Active Member of [employer] or Organizations: Yes   Attends Music therapist: More than 4 times per year   Marital Status: Divorced    Tobacco Counseling Counseling given: Not Answered   Clinical Intake:  Pre-visit preparation completed: Yes  Pain : No/denies pain     Nutritional Risks: None Diabetes: Yes CBG done?: No Did pt. bring in CBG monitor from home?: No  How often do  you need to have someone help you when you read instructions, pamphlets, or other written materials from your doctor or pharmacy?: 1 - Never  Diabetic?  YES  Nutrition Risk Assessment:  Has the patient had any N/V/D within the last 2 months?  No  Does the patient have any non-healing wounds?  No  Has the patient had any unintentional weight loss or weight gain?  No   Diabetes:  Is the patient diabetic?  Yes  If diabetic, was a CBG obtained today?  No  Did the patient bring in their glucometer from home?  No  How often do you monitor your CBG's? Dexacom checks 4 x day.   Financial Strains and Diabetes Management:  Are you having any financial strains with the device, your supplies or your medication? No .  Does the patient want to be seen by Chronic Care Management for management of their diabetes?  Yes  Would the patient like to be referred to a Nutritionist or for Diabetic Management?  No   Diabetic Exams:  Diabetic Eye Exam: Completed 05-2021. Overdue for diabetic eye exam. Pt has been advised about the importance in completing this exam.   Diabetic Foot Exam: Completed . Pt has been advised about the importance in completing this exam. Pt is scheduled for diabetic foot exam on 06-21-2021.    Interpreter Needed?: No  Information entered by :: Leroy Kennedy LPN   Activities of Daily Living In your present state of health, do you have any difficulty performing the following activities: 06/22/2021  Hearing? N  Vision? N  Difficulty concentrating or making decisions? N  Walking or climbing stairs? N  Dressing or bathing? N  Doing errands, shopping? N  Preparing Food and eating ? N  Using the Toilet? N  In the past six months, have you accidently leaked urine? N  Do you have problems with loss of bowel control? N  Managing your Medications? N  Managing your Finances? N  Housekeeping or managing your Housekeeping? N  Some recent data might be hidden    Patient Care  Team: Ma Hillock, DO as PCP - General (Family Medicine) Adrian Prows, MD as Consulting Physician (Cardiology) Druscilla Brownie, MD as Referring Physician (Dermatology) [place],  Willia Craze, OD as Referring Physician Luci Bank, MD as Referring Physician (Orthotics) Sonda Primes, MD as Referring Physician (Obstetrics and Gynecology) Rosana Berger, MD as Referring Physician (Gastroenterology) Renato Shin, MD as Consulting Physician (Endocrinology)  Indicate any recent Medical Services you may have received from other than [facility] providers in the past year (date may be approximate).     Assessment:   This is a routine wellness examination for [place].  Hearing/Vision screen Hearing Screening - Comments:: No trouble hearing Vision Screening - Comments:: 05-2021 up to date Dr. Oswaldo Conroy  Dietary issues and exercise activities discussed: Current Exercise Habits: The patient does not participate in regular exercise at present, Intensity: Not Applicable, Exercise limited by: orthopedic condition(s)   Goals Addressed             This Visit's Progress    Patient Stated       Patient would like to exercise more with stationary bike and eliptical        Depression Screen PHQ 2/9 Scores 06/22/2021 03/18/2021 07/23/2020 04/20/2020  PHQ - 2 Score 0 0 0 0    Fall Risk Fall Risk  06/22/2021 03/18/2021 07/23/2020  Falls in the past year? 0 0 1  Number falls in past yr: 0 0 0  Injury with Fall? 0 0 1  Follow up Falls evaluation completed;Falls prevention discussed - -    FALL RISK PREVENTION PERTAINING TO THE HOME:  Any stairs in or around the home? Yes  If so, are there any without handrails? Yes  Home free of loose throw rugs in walkways, pet beds, electrical cords, etc? Yes  Adequate lighting in your home to reduce risk of falls? Yes   ASSISTIVE DEVICES UTILIZED TO PREVENT FALLS:  Life alert? No  Use of a cane, walker or w/c? No  Grab bars in the bathroom? Yes  Shower chair or  bench in shower? Yes  Elevated toilet seat or a handicapped toilet? No   TIMED UP AND GO:  Was the test performed? No .  Tele-Health Visit  Cognitive Function:  Normal cognitive status assessed by direct observation by this Nurse Health Advisor. No abnormalities found.          Immunizations Immunization History  Administered Date(s) Administered   Influenza-Unspecified 09/26/2019   Moderna Sars-Covid-2 Vaccination 01/22/2020, 02/20/2020, 10/20/2020   Pneumococcal Conjugate-13 10/12/2014   Pneumococcal Polysaccharide-23 10/21/2019   Tdap 08/04/2019    TDAP status: Up to date  Flu Vaccine status: Due, Education has been provided regarding the importance of this vaccine. Advised may receive this vaccine at local pharmacy or Health Dept. Aware to provide a copy of the vaccination record if obtained from local pharmacy or Health Dept. Verbalized acceptance and understanding.  Pneumococcal vaccine status: Up to date  Covid-19 vaccine status: Information provided on how to obtain vaccines.   Qualifies for Shingles Vaccine? Yes   Zostavax completed No   Shingrix Completed?: No.    Education has been provided regarding the importance of this vaccine. Patient has been advised to call insurance company to determine out of pocket expense if they have not yet received this vaccine. Advised may also receive vaccine at local pharmacy or Health Dept. Verbalized acceptance and understanding.  Screening Tests Health Maintenance  Topic Date Due   Zoster Vaccines- Shingrix (1 of 2) Never done   COVID-19 Vaccine (4 - Booster for Moderna series) 01/20/2021   INFLUENZA VACCINE  07/11/2021   HEMOGLOBIN A1C  12/17/2021   OPHTHALMOLOGY EXAM  03/01/2022   MAMMOGRAM  05/25/2022   FOOT EXAM  06/16/2022   COLONOSCOPY (Pts 45-69yrs Insurance coverage will need to be confirmed)  12/12/2027   TETANUS/TDAP  08/03/2029   DEXA SCAN  Completed   PNA vac Low Risk Adult  Completed   HPV VACCINES  Aged  Out   Hepatitis C Screening  Discontinued    Health Maintenance  Health Maintenance Due  Topic Date Due   Zoster Vaccines- Shingrix (1 of 2) Never done   COVID-19 Vaccine (4 - Booster for Moderna series) 01/20/2021    Colorectal cancer screening: Type of screening: Colonoscopy. Completed  . Repeat every 5 years  Mammogram status: Completed 05-25-2020. Repeat every year  Patient will call when ready to schedule Bone Density  Lung Cancer Screening: (Low Dose CT Chest recommended if Age 56-80 years, 30 pack-year currently smoking OR have quit w/in 15years.) does qualify.   Lung Cancer Screening Referral: NA  Additional Screening:  Hepatitis C Screening: does qualify;   Vision Screening: Recommended annual ophthalmology exams for early detection of glaucoma and other disorders of the eye. Is the patient up to date with their annual eye exam?  Yes  Who is the provider or what is the name of the office in which the patient attends annual eye exams? Dr Oswaldo Conroy If pt is not established with a provider, would they like to be referred to a provider to establish care?  established .   Dental Screening: Recommended annual dental exams for proper oral hygiene  Community Resource Referral / Chronic Care Management: CRR required this visit?  No   CCM required this visit?  No      Plan:     I have personally reviewed and noted the following in the patient's chart:   Medical and social history Use of alcohol, tobacco or illicit drugs  Current medications and supplements including opioid prescriptions.  Functional ability and status Nutritional status Physical activity Advanced directives List of other physicians Hospitalizations, surgeries, and ER visits in previous 12 months Vitals Screenings to include cognitive, depression, and falls Referrals and appointments  In addition, I have reviewed and discussed with patient certain preventive protocols, quality metrics, and best  practice recommendations. A written personalized care plan for preventive services as well as general preventive health recommendations were provided to patient.     Leroy Kennedy, LPN   7/00/1749   Nurse Notes: NA

## 2021-06-22 NOTE — Patient Instructions (Signed)
Ann [name] , Thank you for taking time to come for your Medicare Wellness Visit. I appreciate your ongoing commitment to your health goals. Please review the following plan we discussed and let me know if I can assist you in the future.   Screening recommendations/referrals: Colonoscopy: up to date  Mammogram: call to schedule  Bone Density: call to schedule Recommended yearly ophthalmology/optometry visit for glaucoma screening and checkup Recommended yearly dental visit for hygiene and checkup  Vaccinations: Influenza vaccine: education provided Pneumococcal vaccine: up to date Tdap vaccine: up to date Shingles vaccine: education provided    Advanced directives: copy requested  Conditions/risks identified: na     Preventive Care 30 Years and Older, Female Preventive care refers to lifestyle choices and visits with your health care provider that can promote health and wellness. What does preventive care include? A yearly physical exam. This is also called an annual well check. Dental exams once or twice a year. Routine eye exams. Ask your health care provider how often you should have your eyes checked. Personal lifestyle choices, including: Daily care of your teeth and gums. Regular physical activity. Eating a healthy diet. Avoiding tobacco and drug use. Limiting alcohol use. Practicing safe sex. Taking low-dose aspirin every day. Taking vitamin and mineral supplements as recommended by your health care provider. What happens during an annual well check? The services and screenings done by your health care provider during your annual well check will depend on your age, overall health, lifestyle risk factors, and family history of disease. Counseling  Your health care provider may ask you questions about your: Alcohol use. Tobacco use. Drug use. Emotional well-being. Home and relationship well-being. Sexual activity. Eating habits. History of falls. Memory and  ability to understand (cognition). Work and work Statistician. Reproductive health. Screening  You may have the following tests or measurements: Height, weight, and BMI. Blood pressure. Lipid and cholesterol levels. These may be checked every 5 years, or more frequently if you are over 27 years old. Skin check. Lung cancer screening. You may have this screening every year starting at age 42 if you have a 30-pack-year history of smoking and currently smoke or have quit within the past 15 years. Fecal occult blood test (FOBT) of the stool. You may have this test every year starting at age 12. Flexible sigmoidoscopy or colonoscopy. You may have a sigmoidoscopy every 5 years or a colonoscopy every 10 years starting at age 27. Hepatitis C blood test. Hepatitis B blood test. Sexually transmitted disease (STD) testing. Diabetes screening. This is done by checking your blood sugar (glucose) after you have not eaten for a while (fasting). You may have this done every 1-3 years. Bone density scan. This is done to screen for osteoporosis. You may have this done starting at age 38. Mammogram. This may be done every 1-2 years. Talk to your health care provider about how often you should have regular mammograms. Talk with your health care provider about your test results, treatment options, and if necessary, the need for more tests. Vaccines  Your health care provider may recommend certain vaccines, such as: Influenza vaccine. This is recommended every year. Tetanus, diphtheria, and acellular pertussis (Tdap, Td) vaccine. You may need a Td booster every 10 years. Zoster vaccine. You may need this after age 5. Pneumococcal 13-valent conjugate (PCV13) vaccine. One dose is recommended after age 79. Pneumococcal polysaccharide (PPSV23) vaccine. One dose is recommended after age 24. Talk to your health care provider about which screenings and  vaccines you need and how often you need them. This information is  not intended to replace advice given to you by your health care provider. Make sure you discuss any questions you have with your health care provider. Document Released: 12/24/2015 Document Revised: 08/16/2016 Document Reviewed: 09/28/2015 Elsevier Interactive Patient Education  2017 [place] Prevention in the Home Falls can cause injuries. They can happen to people of all ages. There are many things you can do to make your home safe and to help prevent falls. What can I do on the outside of my home? Regularly fix the edges of walkways and driveways and fix any cracks. Remove anything that might make you trip as you walk through a door, such as a raised step or threshold. Trim any bushes or trees on the path to your home. Use bright outdoor lighting. Clear any walking paths of anything that might make someone trip, such as rocks or tools. Regularly check to see if handrails are loose or broken. Make sure that both sides of any steps have handrails. Any raised decks and porches should have guardrails on the edges. Have any leaves, snow, or ice cleared regularly. Use sand or salt on walking paths during winter. Clean up any spills in your garage right away. This includes oil or grease spills. What can I do in the bathroom? Use night lights. Install grab bars by the toilet and in the tub and shower. Do not use towel bars as grab bars. Use non-skid mats or decals in the tub or shower. If you need to sit down in the shower, use a plastic, non-slip stool. Keep the floor dry. Clean up any water that spills on the floor as soon as it happens. Remove soap buildup in the tub or shower regularly. Attach bath mats securely with double-sided non-slip rug tape. Do not have throw rugs and other things on the floor that can make you trip. What can I do in the bedroom? Use night lights. Make sure that you have a light by your bed that is easy to reach. Do not use any sheets or blankets that  are too big for your bed. They should not hang down onto the floor. Have a firm chair that has side arms. You can use this for support while you get dressed. Do not have throw rugs and other things on the floor that can make you trip. What can I do in the kitchen? Clean up any spills right away. Avoid walking on wet floors. Keep items that you use a lot in easy-to-reach places. If you need to reach something above you, use a strong step stool that has a grab bar. Keep electrical cords out of the way. Do not use floor polish or wax that makes floors slippery. If you must use wax, use non-skid floor wax. Do not have throw rugs and other things on the floor that can make you trip. What can I do with my stairs? Do not leave any items on the stairs. Make sure that there are handrails on both sides of the stairs and use them. Fix handrails that are broken or loose. Make sure that handrails are as long as the stairways. Check any carpeting to make sure that it is firmly attached to the stairs. Fix any carpet that is loose or worn. Avoid having throw rugs at the top or bottom of the stairs. If you do have throw rugs, attach them to the floor with carpet tape. Make  sure that you have a light switch at the top of the stairs and the bottom of the stairs. If you do not have them, ask someone to add them for you. What else can I do to help prevent falls? Wear shoes that: Do not have high heels. Have rubber bottoms. Are comfortable and fit you well. Are closed at the toe. Do not wear sandals. If you use a stepladder: Make sure that it is fully opened. Do not climb a closed stepladder. Make sure that both sides of the stepladder are locked into place. Ask someone to hold it for you, if possible. Clearly mark and make sure that you can see: Any grab bars or handrails. First and last steps. Where the edge of each step is. Use tools that help you move around (mobility aids) if they are needed. These  include: Canes. Walkers. Scooters. Crutches. Turn on the lights when you go into a dark area. Replace any light bulbs as soon as they burn out. Set up your furniture so you have a clear path. Avoid moving your furniture around. If any of your floors are uneven, fix them. If there are any pets around you, be aware of where they are. Review your medicines with your doctor. Some medicines can make you feel dizzy. This can increase your chance of falling. Ask your doctor what other things that you can do to help prevent falls. This information is not intended to replace advice given to you by your health care provider. Make sure you discuss any questions you have with your health care provider. Document Released: 09/23/2009 Document Revised: 05/04/2016 Document Reviewed: 01/01/2015 Elsevier Interactive Patient Education  2017 [employer].

## 2021-06-28 ENCOUNTER — Telehealth: Payer: Self-pay | Admitting: Cardiology

## 2021-06-28 NOTE — Telephone Encounter (Signed)
See above from [facility]

## 2021-06-28 NOTE — Telephone Encounter (Signed)
Pulmonary rehab therapeutic exercise    Standing Status:   Future   Standing Expiration Date:   06/08/2022 Yes

## 2021-06-28 NOTE — Telephone Encounter (Signed)
Pt is following up about her being sent to the pulmonary center. States that doctor Einar Gip had one in mind as long as they were a pulmonary rehab.

## 2021-06-28 NOTE — Telephone Encounter (Signed)
Sent to [facility] to put in referral

## 2021-06-30 ENCOUNTER — Other Ambulatory Visit: Payer: Self-pay | Admitting: Cardiology

## 2021-06-30 DIAGNOSIS — R0600 Dyspnea, unspecified: Secondary | ICD-10-CM

## 2021-06-30 DIAGNOSIS — R0609 Other forms of dyspnea: Secondary | ICD-10-CM

## 2021-06-30 DIAGNOSIS — Z6841 Body Mass Index (BMI) 40.0 and over, adult: Secondary | ICD-10-CM

## 2021-08-09 ENCOUNTER — Other Ambulatory Visit: Payer: Self-pay | Admitting: Family Medicine

## 2021-08-09 DIAGNOSIS — F419 Anxiety disorder, unspecified: Secondary | ICD-10-CM

## 2021-08-09 NOTE — Telephone Encounter (Signed)
Pt is aware that she will need to make appt for any add'l refills. Pt stated that she has already picked up her refill that was on file.

## 2021-08-12 ENCOUNTER — Telehealth: Payer: Self-pay

## 2021-08-12 NOTE — Telephone Encounter (Addendum)
Spoke with pt to let her know that her Novo fine pen needles has arrived in the office for Novo Nor Disk.  Pt had an appt on 9/28 and she also picked up her Novo fin pen needles.

## 2021-08-31 DIAGNOSIS — Z23 Encounter for immunization: Secondary | ICD-10-CM | POA: Diagnosis not present

## 2021-09-07 ENCOUNTER — Ambulatory Visit (INDEPENDENT_AMBULATORY_CARE_PROVIDER_SITE_OTHER): Payer: Medicare Other | Admitting: Endocrinology

## 2021-09-07 ENCOUNTER — Other Ambulatory Visit: Payer: Self-pay

## 2021-09-07 VITALS — BP 120/54 | HR 80 | Ht 68.0 in | Wt 288.4 lb

## 2021-09-07 DIAGNOSIS — E119 Type 2 diabetes mellitus without complications: Secondary | ICD-10-CM | POA: Diagnosis not present

## 2021-09-07 LAB — POCT GLYCOSYLATED HEMOGLOBIN (HGB A1C): Hemoglobin A1C: 8.1 % — AB (ref 4.0–5.6)

## 2021-09-07 MED ORDER — NOVOLOG FLEXPEN 100 UNIT/ML ~~LOC~~ SOPN: 60.0000 [IU] | PEN_INJECTOR | Freq: Three times a day (TID) | SUBCUTANEOUS | 3 refills | Status: DC

## 2021-09-07 NOTE — Progress Notes (Signed)
Subjective:    Patient ID: Ann [name], female    DOB: 02/20/1951, 70 y.o.   MRN: [id number]  HPI Pt returns for f/u of diabetes mellitus:  DM type: Insulin-requiring type 2 Dx'ed: 2353 Complications: PAD.   Therapy: insulin since soon after dx.   GDM: never (G0) DKA: never Severe hypoglycemia: never Pancreatitis: never Pancreatic imaging: neg on 2017 CT SDOH: she gets meds from pt assist.   Other: she did not tolerate glipizide (flatulence), Trulicity (difficulty with urination), or Farxiga (vaginitis); She needs to lose weight, for TKR; she did not tolerate Trulicity (urinary retention).   Interval history: I reviewed continuous glucose monitor data.  Glucose varies from 100-345.  It is in general highest 2PM, and at 10PM-12MN.  It is lowest 5-10AM.   She takes Novolog 35-40 units 4 times per day, and Basaglar 30-35 units BID.   Past Medical History:  Diagnosis Date   Arthritis    osteoarthritis- knees.   Atrial fibrillation ([place])    Dr. Marcello Moores status post cardioversion chronic anticoagulation   Cellulitis of right lower extremity 12/30/2020   Chicken pox    Colon cancer ([place]) 2001   Radiation, chemo.  Adenocarcinoma of the colon.  Followed by Dr. Derrel Nip   Complication of anesthesia    sensitive to narcotics"extra sleepy". Versed- "no memory recall for 4 days postop"   Diabetes mellitus without complication ([facility])    oral and Insulin   Hepatitis    "sub clinical case of Hepatitis in late 70's""has immunities"   Hypertension    Hypothyroidism    Laceration of head 08/04/2019   8 cm laceration forehead/scalp after tripping when mowing her lawn.   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   Ovarian teratoma, right 09/2000   Oophorectomy   Sleep apnea    unable to tolreate cpap   Wisdom teeth extracted     Past Surgical History:  Procedure Laterality Date    ABDOMINAL HYSTERECTOMY  09/2000    Total hysterectomy and oophorectomy   ABDOMINAL WOUND DEHISCENCE Bilateral    multiple times / mesh use with multiple abdominal hernia repair   CARDIOVERSION  2014   Unsuccessful.  Patient reports cardiac ablation was not felt to be appropriate next step by cardiology.   CHOLECYSTECTOMY  04/1998   '98 -[place]  2001   "colon cancer"   FOOT SURGERY Bilateral    multiple "hammer toes and bunionectomies"   KNEE ARTHROSCOPY Left 08/2006   meniscectomy   MELANOMA EXCISION     near scapula   TOTAL KNEE ARTHROPLASTY Right 01/24/2016   Procedure: RIGHT TOTAL KNEE ARTHROPLASTY;  Surgeon: Gaynelle Arabian, MD;  Location: [facility];  Service: Orthopedics;  Laterality: Right;    Social History   Socioeconomic History   Marital status: Single    Spouse name: Not on file   Number of children: 0   Years of education: Not on file   Highest education level: Not on file  Occupational History   Not on file  Tobacco Use   Smoking status: Former    Years: 25.00    Types: Cigarettes    Quit date: 01/17/1997    Years since quitting: 24.6   Smokeless tobacco: Never  Vaping Use   Vaping Use: Never used  Substance and Sexual Activity   Alcohol use: No  Drug use: No   Sexual activity: Not Currently  Other Topics Concern   Not on file  Social History Narrative   Marital status/children/pets: Single. G0P0.   Education/employment: Retired Oceanographer:      -smoke alarm in the home:Yes     - wears seatbelt: Yes     - Feels safe in their relationships: Yes   Social Determinants of Health   Financial Resource Strain: Low Risk    Difficulty of Paying Living Expenses: Not hard at all  Food Insecurity: No Food Insecurity   Worried About Charity fundraiser in the Last Year: Never true   [place] in the Last Year: Never true  Transportation Needs: No Transportation Needs   Lack of Transportation  (Medical): No   Lack of Transportation (Non-Medical): No  Physical Activity: Inactive   Days of Exercise per Week: 0 days   Minutes of Exercise per Session: 0 min  Stress: No Stress Concern Present   Feeling of Stress : Not at all  Social Connections: Moderately Integrated   Frequency of Communication with Friends and Family: More than three times a week   Frequency of Social Gatherings with Friends and Family: More than three times a week   Attends Religious Services: More than 4 times per year   Active Member of [employer] or Organizations: Yes   Attends Music therapist: More than 4 times per year   Marital Status: Divorced  Human resources officer Violence: Not At Risk   Fear of Current or Ex-Partner: No   Emotionally Abused: No   Physically Abused: No   Sexually Abused: No    Current Outpatient Medications on File Prior to Visit  Medication Sig Dispense Refill   ALPRAZolam (XANAX) 0.25 MG tablet Take 1 tablet (0.25 mg total) by mouth daily as needed for anxiety. 30 tablet 1   Continuous Blood Gluc Receiver (DEXCOM G6 RECEIVER) DEVI See admin instructions.     Continuous Blood Gluc Transmit (DEXCOM G6 TRANSMITTER) MISC See admin instructions.     dabigatran (PRADAXA) 150 MG CAPS capsule Take 1 capsule (150 mg total) by mouth 2 (two) times daily. 60 capsule 11   EPIPEN 2-PAK 0.3 MG/0.3ML SOAJ injection Inject 0.3 mg into the skin as needed. Bee stings  1   fluticasone (FLONASE) 50 MCG/ACT nasal spray Place 2 sprays into both nostrils daily. (Patient taking differently: Place 2 sprays into both nostrils daily as needed.) 16 g 6   Insulin Glargine (BASAGLAR KWIKPEN) 100 UNIT/ML Inject 25 Units into the skin 2 (two) times daily. 45 mL 3   Lancets (ONETOUCH DELICA PLUS [id number]) MISC Apply 1 each topically 3 (three) times daily.     levothyroxine (SYNTHROID) 50 MCG tablet Take 1 tablet (50 mcg total) by mouth daily before breakfast. 90 tablet 3   metoprolol tartrate (LOPRESSOR) 50 MG  tablet Take 1 tablet (50 mg total) by mouth 2 (two) times daily. 180 tablet 1   olmesartan-hydrochlorothiazide (BENICAR HCT) 40-25 MG tablet Take 1 tablet by mouth daily. 90 tablet 1   ONETOUCH ULTRA test strip SMARTSIG:Via Meter     triamcinolone ointment (KENALOG) 0.5 % Apply topically 2 (two) times daily as needed.     zolpidem (AMBIEN) 5 MG tablet Take 5 mg by mouth at bedtime as needed.     No current facility-administered medications on file prior to visit.    Allergies  Allergen Reactions   Amoxicillin Shortness Of Breath, Swelling and  Anaphylaxis    Has patient had a PCN reaction causing immediate rash, facial/tongue/throat swelling, SOB or lightheadedness with hypotension: Yes Has patient had a PCN reaction causing severe rash involving mucus membranes or skin necrosis: No Has patient had a PCN reaction that required hospitalization No Has patient had a PCN reaction occurring within the last 10 years: Yes If all of the above answers are "NO", then may proceed with Cephalosporin use.  Has patient had a PCN reaction causing immediate rash, facial/tongue/throat swelling, SOB or lightheadedness with hypotension: Yes Has patient had a PCN reaction causing severe rash involving mucus membranes or skin necrosis: No Has patient had a PCN reaction that required hospitalization No Has patient had a PCN reaction occurring within the last 10 years: Yes If all of the above answers are "NO", then may proceed with Cephalosporin use. Has patient had a PCN reaction causing immediate rash, facial/tongue/throat swelling, SOB or lightheadedness with hypotension: Yes Has patient had a PCN reaction causing severe rash involving mucus membranes or skin necrosis: No Has patient had a PCN reaction that required hospitalization No Has patient had a PCN reaction occurring within the last 10 years: Yes If all of the above answers are "NO", then may proceed with Cephalosporin use.    Codeine Itching     Tolerates hydrocodone   Ezetimibe    Livalo [Pitavastatin]    Statins Other (See Comments)    Muscle skeletal side effects   Welchol [Colesevelam]     Gi se    Family History  Problem Relation Age of Onset   Arthritis Mother    Colon cancer Mother    Diabetes Mother    Atrial fibrillation Mother    Diabetes Father    Heart attack Father    Heart disease Father    Stroke Father    Hypertension Maternal Grandmother    Stroke Maternal Grandmother    Stroke Maternal Grandfather    Hypertension Paternal Grandmother    Stroke Paternal Grandmother    Diabetes Paternal Grandfather    Hypertension Paternal Grandfather    Stroke Paternal Grandfather    Diabetes Brother    Brain cancer Brother    Heart disease Brother    Hypertension Brother    Stroke Brother     BP (!) 120/54 (BP Location: Right Arm, Patient Position: Sitting, Cuff Size: Large)   Pulse 80   Ht 5\' 8"  (1.727 m)   Wt 288 lb 6.4 oz (130.8 kg)   SpO2 97%   BMI 43.85 kg/m    Review of Systems     Objective:   Physical Exam Pulses: dorsalis pedis intact bilat.   MSK: no deformity of the feet.  CV: 2+ bilat leg edema, and bilat vv's.   Skin: no ulcer.  there are bilat calluses.  normal color and temp on the feet.   Neuro: sensation is intact to touch on the feet.    Lab Results  Component Value Date   HGBA1C 8.1 (A) 09/07/2021      Assessment & Plan:  Insulin-requiring type 2 DM: uncontrolled.    Patient Instructions  check your blood sugar twice a day.  vary the time of day when you check, between before the 3 meals, and at bedtime.  also check if you have symptoms of your blood sugar being too high or too low.  please keep a record of the readings and bring it to your next appointment here (or you can bring the meter itself).  You  can write it on any piece of paper.  please call us sooner if your blood sugar goes below 70, or if you have a lot of readings over 200.   Please reduce the Basaglar to 25  units twice a day, and:  Increase the Novolog to 60-70, units 3 times a day (just before each meal).   Please come back for a follow-up appointment in 2 months.

## 2021-09-07 NOTE — Patient Instructions (Addendum)
check your blood sugar twice a day.  vary the time of day when you check, between before the 3 meals, and at bedtime.  also check if you have symptoms of your blood sugar being too high or too low.  please keep a record of the readings and bring it to your next appointment here (or you can bring the meter itself).  You can write it on any piece of paper.  please call us sooner if your blood sugar goes below 70, or if you have a lot of readings over 200.   Please reduce the Basaglar to 25 units twice a day, and:  Increase the Novolog to 60-70, units 3 times a day (just before each meal).   Please come back for a follow-up appointment in 2 months.

## 2021-09-08 ENCOUNTER — Ambulatory Visit: Payer: Medicare Other | Admitting: Cardiology

## 2021-09-12 ENCOUNTER — Encounter: Payer: Self-pay | Admitting: Family Medicine

## 2021-09-12 ENCOUNTER — Other Ambulatory Visit: Payer: Self-pay

## 2021-09-12 ENCOUNTER — Ambulatory Visit (INDEPENDENT_AMBULATORY_CARE_PROVIDER_SITE_OTHER): Payer: Medicare Other | Admitting: Family Medicine

## 2021-09-12 VITALS — BP 120/72 | HR 78 | Temp 97.7°F | Ht 68.0 in | Wt 298.0 lb

## 2021-09-12 DIAGNOSIS — I10 Essential (primary) hypertension: Secondary | ICD-10-CM

## 2021-09-12 DIAGNOSIS — F419 Anxiety disorder, unspecified: Secondary | ICD-10-CM

## 2021-09-12 DIAGNOSIS — E782 Mixed hyperlipidemia: Secondary | ICD-10-CM

## 2021-09-12 DIAGNOSIS — E785 Hyperlipidemia, unspecified: Secondary | ICD-10-CM | POA: Diagnosis not present

## 2021-09-12 DIAGNOSIS — G479 Sleep disorder, unspecified: Secondary | ICD-10-CM

## 2021-09-12 DIAGNOSIS — I4821 Permanent atrial fibrillation: Secondary | ICD-10-CM

## 2021-09-12 DIAGNOSIS — E034 Atrophy of thyroid (acquired): Secondary | ICD-10-CM | POA: Diagnosis not present

## 2021-09-12 DIAGNOSIS — E1169 Type 2 diabetes mellitus with other specified complication: Secondary | ICD-10-CM | POA: Diagnosis not present

## 2021-09-12 MED ORDER — OLMESARTAN MEDOXOMIL-HCTZ 40-25 MG PO TABS: 1.0000 | ORAL_TABLET | Freq: Every day | ORAL | 1 refills | Status: DC

## 2021-09-12 MED ORDER — FLUTICASONE PROPIONATE 50 MCG/ACT NA SUSP: 2.0000 | Freq: Every day | NASAL | 6 refills | Status: DC

## 2021-09-12 MED ORDER — METOPROLOL TARTRATE 50 MG PO TABS: 50.0000 mg | ORAL_TABLET | Freq: Two times a day (BID) | ORAL | 1 refills | Status: DC

## 2021-09-12 MED ORDER — ALPRAZOLAM 0.25 MG PO TABS: 0.2500 mg | ORAL_TABLET | Freq: Every day | ORAL | 5 refills | Status: DC | PRN

## 2021-09-12 MED ORDER — ZOLPIDEM TARTRATE 5 MG PO TABS: 5.0000 mg | ORAL_TABLET | Freq: Every evening | ORAL | 5 refills | Status: DC | PRN

## 2021-09-12 NOTE — Progress Notes (Signed)
Patient ID: Ann [name], female  DOB: 03-25-1951, 70 y.o.   MRN: [id number] Patient Care Team    Relationship Specialty Notifications Start End  Ma Hillock, DO PCP - General Family Medicine  04/20/20   Adrian Prows, MD Consulting Physician Cardiology  04/20/20   Druscilla Brownie, MD Referring Physician Dermatology  04/20/20   Otelia Sergeant, OD Referring Physician   04/20/20   Luci Bank, MD Referring Physician Orthotics  04/20/20   Sonda Primes, MD Referring Physician Obstetrics and Gynecology  04/20/20   Rosana Berger, MD Referring Physician Gastroenterology  04/20/20   Renato Shin, MD Consulting Physician Endocrinology  04/27/21     Chief Complaint  Patient presents with   Hypertension    [place]; pt is fasting    Subjective: Ann [name] is a 70 y.o.  female present for [facility] HTN/HLD/morbid obesity/atrial fibrillation: Pt reports compliance with Toprol 50 mg twice daily and olmesartan-HCTZ 40-25 mg daily. Blood pressures ranges at home than normal limits.  Patient denies chest pain, shortness of breath or lower extremity edema.  Pt declined statin. Labs due next visit.  RF: Hypertension, hyperlipidemia, obesity, diabetes, fib, family history of heart disease  Sleep disturbance/anxiety Patient presents today to discuss her sleep disorder and anxiety.  She states with the Ambien she gets a straight 5 hours of sleep and feels well rested when she uses this medication.  She will take Xanax at times, she uses this very infrequently only when needed with increased anxiety or unable to fall asleep. Prior note: Patient presents today to discuss her Xanax use.  She uses her Xanax very infrequently.  She states she only uses Xanax when she is feeling like she is having increased anxiety or panic, which is usually preceded by her feeling that her heart rate is increasing.  She states the Xanax will resolve this feeling quite quickly.  She had a prescription that was written last May  for 15 tabs of Xanax 0.25 mg and 1 refill.  She was not aware she had the refill, but is down to only 2 Xanax left and she would like refills today.  She did not seem to realize the law and [place] in which she needs to be seen every 6 months to renew prescriptions that are controlled substances, and that they will automatically expire after 6 months whether there are refills left on the prescription or not.  Patient did sign a contract last year, we will update this today.  UDS was also collected last year. She states she still is not sleeping well without the Ambien.  She had been on Ambien 10 mg nightly in the past and then was having difficulties receiving Ambien prescriptions through her prior PCP.  We discussed today that Ambien 5 mg is the maximum recommended dose for age.  She should be able to receive this medication.  She states that she thought Medicare would not pay for this medication in people over 65. Prior note: Patient reports she had been on Ambien up until last year.  She states her prior PCP would not refill her Ambien for her.  She states she still does not sleep well, but she will manage since she cannot have the Ambien.  By records it appears she was tried on Klonopin within the last few months.  She did not feel she needed type of medication for sleep, but needed a hypnotic.  She reports she has had a prescription for Xanax that  dates back to around 2018.  She uses this when she has increased anxiety or heart racing.  She reports she is used this about 9 times in 3 years.  She would like a refill on Xanax.  She states her prior PCP would not refill Xanax or Ambien, therefore she left the practice.  Depression screen [facility] 2/9 09/12/2021 06/22/2021 03/18/2021 07/23/2020 04/20/2020  Decreased Interest 0 0 0 0 0  Down, Depressed, Hopeless 0 0 0 0 0  PHQ - 2 Score 0 0 0 0 0  Altered sleeping 3 - - - -  Tired, decreased energy 0 - - - -  Change in appetite 2 - - - -  Feeling bad or  failure about yourself  0 - - - -  Trouble concentrating 0 - - - -  Moving slowly or fidgety/restless 0 - - - -  Suicidal thoughts 0 - - - -  PHQ-9 Score 5 - - - -   GAD 7 : Generalized Anxiety Score 09/12/2021  Nervous, Anxious, on Edge 1  Control/stop worrying 0  Worry too much - different things 0  Trouble relaxing 0  Restless 0  Easily annoyed or irritable 0  Afraid - awful might happen 0  Total GAD 7 Score 1       Fall Risk  09/12/2021 06/22/2021 03/18/2021 07/23/2020  Falls in the past year? 0 0 0 1  Number falls in past yr: 0 0 0 0  Injury with Fall? 0 0 0 1  Follow up Falls evaluation completed Falls evaluation completed;Falls prevention discussed - -    Immunization History  Administered Date(s) Administered   Influenza-Unspecified 09/26/2019   Moderna SARS-COV2 Booster Vaccination 09/02/2021   Moderna Sars-Covid-2 Vaccination 01/22/2020, 02/20/2020, 10/20/2020   Pneumococcal Conjugate-13 10/12/2014   Pneumococcal Polysaccharide-23 10/21/2019   Tdap 08/04/2019    No results found.  Past Medical History:  Diagnosis Date   Arthritis    osteoarthritis- knees.   Atrial fibrillation ([place])    Dr. Marcello Moores status post cardioversion chronic anticoagulation   Cellulitis of right lower extremity 12/30/2020   Chicken pox    Colon cancer ([place]) 2001   Radiation, chemo.  Adenocarcinoma of the colon.  Followed by Dr. Derrel Nip   Complication of anesthesia    sensitive to narcotics"extra sleepy". Versed- "no memory recall for 4 days postop"   Diabetes mellitus without complication (HCC)    oral and Insulin   Hepatitis    "sub clinical case of Hepatitis in late 70's""has immunities"   Hypertension    Hypothyroidism    Laceration of head 08/04/2019   8 cm laceration forehead/scalp after tripping when mowing her lawn.   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   Ovarian  teratoma, right 09/2000   Oophorectomy   Sleep apnea    unable to tolreate cpap   Wisdom teeth extracted    Allergies  Allergen Reactions   Amoxicillin Shortness Of Breath, Swelling and Anaphylaxis    Has patient had a PCN reaction causing immediate rash, facial/tongue/throat swelling, SOB or lightheadedness with hypotension: Yes Has patient had a PCN reaction causing severe rash involving mucus membranes or skin necrosis: No Has patient had a PCN reaction that required hospitalization No Has patient had a PCN reaction occurring within the last 10 years: Yes If all of the above answers are "NO", then may  proceed with Cephalosporin use.  Has patient had a PCN reaction causing immediate rash, facial/tongue/throat swelling, SOB or lightheadedness with hypotension: Yes Has patient had a PCN reaction causing severe rash involving mucus membranes or skin necrosis: No Has patient had a PCN reaction that required hospitalization No Has patient had a PCN reaction occurring within the last 10 years: Yes If all of the above answers are "NO", then may proceed with Cephalosporin use. Has patient had a PCN reaction causing immediate rash, facial/tongue/throat swelling, SOB or lightheadedness with hypotension: Yes Has patient had a PCN reaction causing severe rash involving mucus membranes or skin necrosis: No Has patient had a PCN reaction that required hospitalization No Has patient had a PCN reaction occurring within the last 10 years: Yes If all of the above answers are "NO", then may proceed with Cephalosporin use.    Codeine Itching    Tolerates hydrocodone   Ezetimibe    Livalo [Pitavastatin]    Statins Other (See Comments)    Muscle skeletal side effects   Welchol [Colesevelam]     Gi se   Past Surgical History:  Procedure Laterality Date   ABDOMINAL HYSTERECTOMY  09/2000    Total hysterectomy and oophorectomy   ABDOMINAL WOUND DEHISCENCE Bilateral    multiple times / mesh use with  multiple abdominal hernia repair   CARDIOVERSION  2014   Unsuccessful.  Patient reports cardiac ablation was not felt to be appropriate next step by cardiology.   CHOLECYSTECTOMY  04/1998   '98 -[place]  2001   "colon cancer"   FOOT SURGERY Bilateral    multiple "hammer toes and bunionectomies"   KNEE ARTHROSCOPY Left 08/2006   meniscectomy   MELANOMA EXCISION     near scapula   TOTAL KNEE ARTHROPLASTY Right 01/24/2016   Procedure: RIGHT TOTAL KNEE ARTHROPLASTY;  Surgeon: Gaynelle Arabian, MD;  Location: [facility];  Service: Orthopedics;  Laterality: Right;   Family History  Problem Relation Age of Onset   Arthritis Mother    Colon cancer Mother    Diabetes Mother    Atrial fibrillation Mother    Diabetes Father    Heart attack Father    Heart disease Father    Stroke Father    Hypertension Maternal Grandmother    Stroke Maternal Grandmother    Stroke Maternal Grandfather    Hypertension Paternal Grandmother    Stroke Paternal Grandmother    Diabetes Paternal Grandfather    Hypertension Paternal Grandfather    Stroke Paternal Grandfather    Diabetes Brother    Brain cancer Brother    Heart disease Brother    Hypertension Brother    Stroke Brother    Social History   Social History Narrative   Marital status/children/pets: Single. G0P0.   Education/employment: Retired Oceanographer:      -smoke alarm in the home:Yes     - wears seatbelt: Yes     - Feels safe in their relationships: Yes    Allergies as of 09/12/2021       Reactions   Amoxicillin Shortness Of Breath, Swelling, Anaphylaxis   Has patient had a PCN reaction causing immediate rash, facial/tongue/throat swelling, SOB or lightheadedness with hypotension: Yes Has patient had a PCN reaction causing severe rash involving mucus membranes or skin necrosis: No Has patient had a PCN reaction that required hospitalization No Has patient had a PCN reaction  occurring within the last 10 years: Yes If all of the  above answers are "NO", then may proceed with Cephalosporin use. Has patient had a PCN reaction causing immediate rash, facial/tongue/throat swelling, SOB or lightheadedness with hypotension: Yes Has patient had a PCN reaction causing severe rash involving mucus membranes or skin necrosis: No Has patient had a PCN reaction that required hospitalization No Has patient had a PCN reaction occurring within the last 10 years: Yes If all of the above answers are "NO", then may proceed with Cephalosporin use. Has patient had a PCN reaction causing immediate rash, facial/tongue/throat swelling, SOB or lightheadedness with hypotension: Yes Has patient had a PCN reaction causing severe rash involving mucus membranes or skin necrosis: No Has patient had a PCN reaction that required hospitalization No Has patient had a PCN reaction occurring within the last 10 years: Yes If all of the above answers are "NO", then may proceed with Cephalosporin use.   Codeine Itching   Tolerates hydrocodone   Ezetimibe    Livalo [pitavastatin]    Statins Other (See Comments)   Muscle skeletal side effects   Welchol [colesevelam]    Gi se        Medication List        Accurate as of September 12, 2021 11:35 AM. If you have any questions, ask your nurse or doctor.          ALPRAZolam 0.25 MG tablet Commonly known as: XANAX Take 1 tablet (0.25 mg total) by mouth daily as needed for anxiety.   Basaglar KwikPen 100 UNIT/ML Inject 25 Units into the skin 2 (two) times daily.   dabigatran 150 MG Caps capsule Commonly known as: PRADAXA Take 1 capsule (150 mg total) by mouth 2 (two) times daily.   Dexcom G6 Receiver Kerrin Mo See admin instructions.   Dexcom G6 Sensor Misc SMARTSIG:1 Device Topical   Dexcom G6 Transmitter Misc See admin instructions.   EpiPen 2-Pak 0.3 mg/0.3 mL Soaj injection Generic drug: EPINEPHrine Inject 0.3 mg into the skin as  needed. Bee stings   fluticasone 50 MCG/ACT nasal spray Commonly known as: FLONASE Place 2 sprays into both nostrils daily. What changed:  when to take this reasons to take this   levothyroxine 50 MCG tablet Commonly known as: SYNTHROID Take 1 tablet (50 mcg total) by mouth daily before breakfast.   metoprolol tartrate 50 MG tablet Commonly known as: LOPRESSOR Take 1 tablet (50 mg total) by mouth 2 (two) times daily.   NovoLOG FlexPen 100 UNIT/ML FlexPen Generic drug: insulin aspart Inject 60-70 Units into the skin 3 (three) times daily with meals.   olmesartan-hydrochlorothiazide 40-25 MG tablet Commonly known as: BENICAR HCT Take 1 tablet by mouth daily.   OneTouch Delica Plus [id number] Misc Apply 1 each topically 3 (three) times daily.   OneTouch Ultra test strip Generic drug: glucose blood SMARTSIG:Via Meter   triamcinolone ointment 0.5 % Commonly known as: KENALOG Apply topically 2 (two) times daily as needed.   zolpidem 5 MG tablet Commonly known as: AMBIEN Take 1 tablet (5 mg total) by mouth at bedtime as needed.        All past medical history, surgical history, allergies, family history, immunizations andmedications were updated in the EMR today and reviewed under the history and medication portions of their EMR.     ROS: 14 pt review of systems performed and negative (unless mentioned in an HPI)  Objective: BP 120/72   Pulse 78   Temp 97.7 F (36.5 C) (Oral)   Ht 5\' 8"  (1.727 m)   Wt  298 lb (135.2 kg)   SpO2 96%   BMI 45.31 kg/m  Gen: Afebrile. No acute distress. Pleasant obese female.  HENT: AT. [place].  Eyes:Pupils Equal Round Reactive to light, Extraocular movements intact,  Conjunctiva without redness, discharge or icterus. CV: RRR no murmur, no edema Chest: CTAB, no wheeze or crackles Neuro: Normal gait. PERLA. EOMi. Alert. Oriented x3 Psych: Normal affect, dress and demeanor. Normal speech. Normal thought content and  judgment.   Assessment/plan: Ann [name] is a 70 y.o. female present for [facility] Anxiety/sleep disturbance Stable Continue Xanax 0.25 mg daily daily as needed.  She rarely uses this medicine. She uses medications appropriately and understands risks of drug class. Continue Ambien at the 5 mg dose for her.  She understands 5 mg is the maximum recommended dose for those in her age bracket.  If for some reason her Medicare plan will not cover this we can consider GoodRx prescription since generic Ambien is rather inexpensive. Patient understands State regulations on controlled substances and [facility] policy on prescribing controlled substances.  Contract updated today [place] controlled substance database reviewed 09/12/21 Patient aware a face-to-face follow-up will be needed if she would need refills after 6 months.  Patient aware she will need to request these scripts they will not be automatically refilled since they are controlled substances.  Hypertension/morbid obesity:  Stable. Continue Pradaxa twice daily Continue metoprolol 50 mg twice daily Continue valsartan-HCTZ 40-25 daily Heart healthy diet recommended Statin declined Labs due next visit Follow-up 5.5 months.  DM w/ hyperlipidemia:  Diabetes managed by endocrine. Cholesterol has been elevated.  Heart healthy diet and routine exercise encouraged.  Medication management has been declined by patient. Labs due next visit  Return in about 22 weeks (around 02/13/2022) for [place] (30 min).  No orders of the defined types were placed in this encounter.  Meds ordered this encounter  Medications   fluticasone (FLONASE) 50 MCG/ACT nasal spray    Sig: Place 2 sprays into both nostrils daily.    Dispense:  16 g    Refill:  6    Hold until pt request.   DISCONTD: metoprolol tartrate (LOPRESSOR) 50 MG tablet    Sig: Take 1 tablet (50 mg total) by mouth 2 (two) times daily.    Dispense:  180 tablet    Refill:  1    DISCONTD: olmesartan-hydrochlorothiazide (BENICAR HCT) 40-25 MG tablet    Sig: Take 1 tablet by mouth daily.    Dispense:  90 tablet    Refill:  1   zolpidem (AMBIEN) 5 MG tablet    Sig: Take 1 tablet (5 mg total) by mouth at bedtime as needed.    Dispense:  30 tablet    Refill:  5   ALPRAZolam (XANAX) 0.25 MG tablet    Sig: Take 1 tablet (0.25 mg total) by mouth daily as needed for anxiety.    Dispense:  30 tablet    Refill:  5   olmesartan-hydrochlorothiazide (BENICAR HCT) 40-25 MG tablet    Sig: Take 1 tablet by mouth daily.    Dispense:  90 tablet    Refill:  1   metoprolol tartrate (LOPRESSOR) 50 MG tablet    Sig: Take 1 tablet (50 mg total) by mouth 2 (two) times daily.    Dispense:  180 tablet    Refill:  1    Referral Orders  No referral(s) requested today     Note is dictated utilizing voice recognition software. Although note has been  proof read prior to signing, occasional typographical errors still can be missed. If any questions arise, please do not hesitate to call for verification.  Electronically signed by: Howard Pouch, DO [place]

## 2021-09-12 NOTE — Patient Instructions (Signed)
Great to see you today.  I have refilled the medication(s) we provide.   If labs were collected, we will inform you of lab results once received either by echart message or telephone call.   - echart message- for normal results that have been seen by the patient already.   - telephone call: abnormal results or if patient has not viewed results in their echart.  

## 2021-09-23 ENCOUNTER — Telehealth: Payer: Self-pay

## 2021-09-23 NOTE — Telephone Encounter (Signed)
Pt has been contact regarding her 7 boxes of Novolog Flex pen from [employer] arriving here at the office.

## 2021-09-26 DIAGNOSIS — Z23 Encounter for immunization: Secondary | ICD-10-CM | POA: Diagnosis not present

## 2021-09-27 ENCOUNTER — Telehealth: Payer: Self-pay | Admitting: Endocrinology

## 2021-09-27 NOTE — Telephone Encounter (Signed)
Pt having manufacturing issues, will talk to [facility], but needs sensors and transmitter to  [id number] - [place], [place] - [address] AT [place]

## 2021-09-28 ENCOUNTER — Encounter: Payer: Self-pay | Admitting: Endocrinology

## 2021-09-28 ENCOUNTER — Other Ambulatory Visit: Payer: Self-pay

## 2021-09-28 DIAGNOSIS — E119 Type 2 diabetes mellitus without complications: Secondary | ICD-10-CM

## 2021-09-28 MED ORDER — DEXCOM G6 TRANSMITTER MISC: 3 refills | Status: DC

## 2021-09-28 MED ORDER — DEXCOM G6 SENSOR MISC
3 refills | Status: DC
Start: 2021-09-28 — End: 2022-04-10

## 2021-09-28 NOTE — Telephone Encounter (Signed)
Rx sent 

## 2021-11-15 ENCOUNTER — Ambulatory Visit: Payer: Medicare Other | Admitting: Endocrinology

## 2021-11-18 ENCOUNTER — Ambulatory Visit (INDEPENDENT_AMBULATORY_CARE_PROVIDER_SITE_OTHER): Payer: Medicare Other | Admitting: Endocrinology

## 2021-11-18 ENCOUNTER — Other Ambulatory Visit: Payer: Self-pay

## 2021-11-18 ENCOUNTER — Other Ambulatory Visit: Payer: Self-pay | Admitting: Endocrinology

## 2021-11-18 VITALS — BP 140/88 | HR 87 | Ht 68.0 in | Wt 289.4 lb

## 2021-11-18 DIAGNOSIS — E785 Hyperlipidemia, unspecified: Secondary | ICD-10-CM | POA: Diagnosis not present

## 2021-11-18 DIAGNOSIS — E119 Type 2 diabetes mellitus without complications: Secondary | ICD-10-CM

## 2021-11-18 DIAGNOSIS — E1169 Type 2 diabetes mellitus with other specified complication: Secondary | ICD-10-CM

## 2021-11-18 LAB — POCT GLYCOSYLATED HEMOGLOBIN (HGB A1C): Hemoglobin A1C: 8.5 % — AB (ref 4.0–5.6)

## 2021-11-18 MED ORDER — TIRZEPATIDE 2.5 MG/0.5ML ~~LOC~~ SOAJ: 2.5000 mg | SUBCUTANEOUS | 3 refills | Status: DC

## 2021-11-18 MED ORDER — BASAGLAR KWIKPEN 100 UNIT/ML ~~LOC~~ SOPN: 40.0000 [IU] | PEN_INJECTOR | SUBCUTANEOUS | 3 refills | Status: DC

## 2021-11-18 NOTE — Progress Notes (Signed)
Subjective:    Patient ID: Ann [name], female    DOB: [birth year], 70 y.o.   MRN: [id number]  HPI Pt returns for f/u of diabetes mellitus:  DM type: Insulin-requiring type 2 Dx'ed: 8527 Complications: PAD.   Therapy: insulin since soon after dx.   GDM: never (G0) DKA: never Severe hypoglycemia: never Pancreatitis: never Pancreatic imaging: neg on 2017 CT SDOH: she gets meds from pt assist.   Other: she did not tolerate glipizide (flatulence), Trulicity (difficulty with urination), or Farxiga (vaginitis); She needs to lose weight, for TKR; she did not tolerate Trulicity (urinary retention).   Interval history: I reviewed continuous glucose monitor data.  Glucose varies from 95-380.  It is in general highest 12MN.  It decreases overnight.  It is lowest 6AM-8AM.   She takes Novolog 35-40 units 4 times per day, and Basaglar 30-35 units BID.   Past Medical History:  Diagnosis Date   Arthritis    osteoarthritis- knees.   Atrial fibrillation ([place])    Dr. Marcello Moores status post cardioversion chronic anticoagulation   Cellulitis of right lower extremity 12/30/2020   Chicken pox    Colon cancer ([place]) 2001   Radiation, chemo.  Adenocarcinoma of the colon.  Followed by Dr. Derrel Nip   Complication of anesthesia    sensitive to narcotics"extra sleepy". Versed- "no memory recall for 4 days postop"   Diabetes mellitus without complication (HCC)    oral and Insulin   Hepatitis    "sub clinical case of Hepatitis in late 70's""has immunities"   Hypertension    Hypothyroidism    Laceration of head 08/04/2019   8 cm laceration forehead/scalp after tripping when mowing her lawn.   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   Ovarian teratoma, right 09/2000   Oophorectomy   Sleep apnea    unable to tolreate cpap   Wisdom teeth extracted     Past Surgical History:  Procedure Laterality  Date   ABDOMINAL HYSTERECTOMY  09/2000    Total hysterectomy and oophorectomy   ABDOMINAL WOUND DEHISCENCE Bilateral    multiple times / mesh use with multiple abdominal hernia repair   CARDIOVERSION  2014   Unsuccessful.  Patient reports cardiac ablation was not felt to be appropriate next step by cardiology.   CHOLECYSTECTOMY  04/1998   '98 -[place]  2001   "colon cancer"   FOOT SURGERY Bilateral    multiple "hammer toes and bunionectomies"   KNEE ARTHROSCOPY Left 08/2006   meniscectomy   MELANOMA EXCISION     near scapula   TOTAL KNEE ARTHROPLASTY Right 01/24/2016   Procedure: RIGHT TOTAL KNEE ARTHROPLASTY;  Surgeon: Gaynelle Arabian, MD;  Location: [facility];  Service: Orthopedics;  Laterality: Right;    Social History   Socioeconomic History   Marital status: Single    Spouse name: Not on file   Number of children: 0   Years of education: Not on file   Highest education level: Not on file  Occupational History   Not on file  Tobacco Use   Smoking status: Former    Years: 25.00    Types: Cigarettes    Quit date: 01/17/1997    Years since quitting: 24.8   Smokeless tobacco: Never  Vaping Use   Vaping Use: Never used  Substance and Sexual Activity   Alcohol use: No  Drug use: No   Sexual activity: Not Currently  Other Topics Concern   Not on file  Social History Narrative   Marital status/children/pets: Single. G0P0.   Education/employment: Retired Oceanographer:      -smoke alarm in the home:Yes     - wears seatbelt: Yes     - Feels safe in their relationships: Yes   Social Determinants of Health   Financial Resource Strain: Low Risk    Difficulty of Paying Living Expenses: Not hard at all  Food Insecurity: No Food Insecurity   Worried About Charity fundraiser in the Last Year: Never true   [place] in the Last Year: Never true  Transportation Needs: No Transportation Needs   Lack of  Transportation (Medical): No   Lack of Transportation (Non-Medical): No  Physical Activity: Inactive   Days of Exercise per Week: 0 days   Minutes of Exercise per Session: 0 min  Stress: No Stress Concern Present   Feeling of Stress : Not at all  Social Connections: Moderately Integrated   Frequency of Communication with Friends and Family: More than three times a week   Frequency of Social Gatherings with Friends and Family: More than three times a week   Attends Religious Services: More than 4 times per year   Active Member of [employer] or Organizations: Yes   Attends Music therapist: More than 4 times per year   Marital Status: Divorced  Human resources officer Violence: Not At Risk   Fear of Current or Ex-Partner: No   Emotionally Abused: No   Physically Abused: No   Sexually Abused: No    Current Outpatient Medications on File Prior to Visit  Medication Sig Dispense Refill   ALPRAZolam (XANAX) 0.25 MG tablet Take 1 tablet (0.25 mg total) by mouth daily as needed for anxiety. 30 tablet 5   Continuous Blood Gluc Receiver (DEXCOM G6 RECEIVER) DEVI See admin instructions.     Continuous Blood Gluc Sensor (DEXCOM G6 SENSOR) MISC Change every 10 days 9 each 3   Continuous Blood Gluc Transmit (DEXCOM G6 TRANSMITTER) MISC Change every 3 mos 1 each 3   dabigatran (PRADAXA) 150 MG CAPS capsule Take 1 capsule (150 mg total) by mouth 2 (two) times daily. 60 capsule 11   EPIPEN 2-PAK 0.3 MG/0.3ML SOAJ injection Inject 0.3 mg into the skin as needed. Bee stings  1   fluticasone (FLONASE) 50 MCG/ACT nasal spray Place 2 sprays into both nostrils daily. 16 g 6   insulin aspart (NOVOLOG FLEXPEN) 100 UNIT/ML FlexPen Inject 60-70 Units into the skin 3 (three) times daily with meals. 180 mL 3   Lancets (ONETOUCH DELICA PLUS [id number]) MISC Apply 1 each topically 3 (three) times daily.     levothyroxine (SYNTHROID) 50 MCG tablet Take 1 tablet (50 mcg total) by mouth daily before breakfast. 90  tablet 3   metoprolol tartrate (LOPRESSOR) 50 MG tablet Take 1 tablet (50 mg total) by mouth 2 (two) times daily. 180 tablet 1   olmesartan-hydrochlorothiazide (BENICAR HCT) 40-25 MG tablet Take 1 tablet by mouth daily. 90 tablet 1   ONETOUCH ULTRA test strip SMARTSIG:Via Meter     triamcinolone ointment (KENALOG) 0.5 % Apply topically 2 (two) times daily as needed.     zolpidem (AMBIEN) 5 MG tablet Take 1 tablet (5 mg total) by mouth at bedtime as needed. 30 tablet 5   No current facility-administered medications on file prior to visit.  Allergies  Allergen Reactions   Amoxicillin Shortness Of Breath, Swelling and Anaphylaxis    Has patient had a PCN reaction causing immediate rash, facial/tongue/throat swelling, SOB or lightheadedness with hypotension: Yes Has patient had a PCN reaction causing severe rash involving mucus membranes or skin necrosis: No Has patient had a PCN reaction that required hospitalization No Has patient had a PCN reaction occurring within the last 10 years: Yes If all of the above answers are "NO", then may proceed with Cephalosporin use.  Has patient had a PCN reaction causing immediate rash, facial/tongue/throat swelling, SOB or lightheadedness with hypotension: Yes Has patient had a PCN reaction causing severe rash involving mucus membranes or skin necrosis: No Has patient had a PCN reaction that required hospitalization No Has patient had a PCN reaction occurring within the last 10 years: Yes If all of the above answers are "NO", then may proceed with Cephalosporin use. Has patient had a PCN reaction causing immediate rash, facial/tongue/throat swelling, SOB or lightheadedness with hypotension: Yes Has patient had a PCN reaction causing severe rash involving mucus membranes or skin necrosis: No Has patient had a PCN reaction that required hospitalization No Has patient had a PCN reaction occurring within the last 10 years: Yes If all of the above answers  are "NO", then may proceed with Cephalosporin use.    Codeine Itching    Tolerates hydrocodone   Ezetimibe    Livalo [Pitavastatin]    Statins Other (See Comments)    Muscle skeletal side effects   Welchol [Colesevelam]     Gi se    Family History  Problem Relation Age of Onset   Arthritis Mother    Colon cancer Mother    Diabetes Mother    Atrial fibrillation Mother    Diabetes Father    Heart attack Father    Heart disease Father    Stroke Father    Hypertension Maternal Grandmother    Stroke Maternal Grandmother    Stroke Maternal Grandfather    Hypertension Paternal Grandmother    Stroke Paternal Grandmother    Diabetes Paternal Grandfather    Hypertension Paternal Grandfather    Stroke Paternal Grandfather    Diabetes Brother    Brain cancer Brother    Heart disease Brother    Hypertension Brother    Stroke Brother     BP 140/88 (BP Location: Right Arm, Patient Position: Sitting, Cuff Size: Normal)   Pulse 87   Ht 5\' 8"  (1.727 m)   Wt 289 lb 6.4 oz (131.3 kg)   SpO2 97%   BMI 44.00 kg/m    Review of Systems     Objective:   Physical Exam    Lab Results  Component Value Date   CREATININE 0.89 03/18/2021   BUN 17 03/18/2021   NA 136 03/18/2021   K 4.2 03/18/2021   CL 98 03/18/2021   CO2 29 03/18/2021   A1c=8.5%    Assessment & Plan:  Insulin-requiring type 2 DM: uncontrolled.   Patient Instructions  check your blood sugar twice a day.  vary the time of day when you check, between before the 3 meals, and at bedtime.  also check if you have symptoms of your blood sugar being too high or too low.  please keep a record of the readings and bring it to your next appointment here (or you can bring the meter itself).  You can write it on any piece of paper.  please call us sooner if your blood  sugar goes below 70, or if you have a lot of readings over 200.   Please reduce the Basaglar to 40 units each morning, and none in the evening.   I have sent a  prescription to your pharmacy, to add [facility].   Please continue the same Novolog.   Please come back for a follow-up appointment in 2 months.

## 2021-11-18 NOTE — Patient Instructions (Addendum)
check your blood sugar twice a day.  vary the time of day when you check, between before the 3 meals, and at bedtime.  also check if you have symptoms of your blood sugar being too high or too low.  please keep a record of the readings and bring it to your next appointment here (or you can bring the meter itself).  You can write it on any piece of paper.  please call us sooner if your blood sugar goes below 70, or if you have a lot of readings over 200.   Please reduce the Basaglar to 40 units each morning, and none in the evening.   I have sent a prescription to your pharmacy, to add [facility].   Please continue the same Novolog.   Please come back for a follow-up appointment in 2 months.

## 2021-11-20 ENCOUNTER — Other Ambulatory Visit: Payer: Self-pay | Admitting: Endocrinology

## 2021-11-20 MED ORDER — VICTOZA 18 MG/3ML ~~LOC~~ SOPN: 0.6000 mg | PEN_INJECTOR | Freq: Every day | SUBCUTANEOUS | 3 refills | Status: DC

## 2021-11-21 ENCOUNTER — Telehealth: Payer: Self-pay | Admitting: Endocrinology

## 2021-11-21 NOTE — Telephone Encounter (Addendum)
PT called again and pharmacy has not received and updated prescription for [facility]. Please forward to  [id number] - [place], [place] Phone:  [phone]  Fax:  [phone]       PT called stating wrong medication was ordered and sent to pharmacy. Pharmacy filled [place] instead of [facility].  Please correct and call PT at [phone] ASAP.

## 2021-12-06 NOTE — Telephone Encounter (Signed)
Patient called regard mounjaro medication. Per Provider previous note insurance will not cover but alternate has not been sent. Please contact Patient via [phone]

## 2021-12-07 NOTE — Telephone Encounter (Signed)
Spoke with pharmacy and they had script on file  for Victoza and they will process for tomorrow.

## 2022-01-02 ENCOUNTER — Telehealth: Payer: Self-pay | Admitting: Cardiology

## 2022-01-02 NOTE — Telephone Encounter (Signed)
Ann [name] with [facility] prior authorization said she needs to speak with a med assistant in regards to this patient. Phone number is [phone].

## 2022-01-09 ENCOUNTER — Other Ambulatory Visit: Payer: Self-pay

## 2022-01-09 MED ORDER — DABIGATRAN ETEXILATE MESYLATE 150 MG PO CAPS: 150.0000 mg | ORAL_CAPSULE | Freq: Two times a day (BID) | ORAL | 3 refills | Status: DC

## 2022-01-16 DIAGNOSIS — L814 Other melanin hyperpigmentation: Secondary | ICD-10-CM | POA: Diagnosis not present

## 2022-01-16 DIAGNOSIS — X32XXXS Exposure to sunlight, sequela: Secondary | ICD-10-CM | POA: Diagnosis not present

## 2022-01-16 DIAGNOSIS — L570 Actinic keratosis: Secondary | ICD-10-CM | POA: Diagnosis not present

## 2022-01-16 DIAGNOSIS — L578 Other skin changes due to chronic exposure to nonionizing radiation: Secondary | ICD-10-CM | POA: Diagnosis not present

## 2022-01-16 DIAGNOSIS — Z85820 Personal history of malignant melanoma of skin: Secondary | ICD-10-CM | POA: Diagnosis not present

## 2022-01-30 ENCOUNTER — Encounter: Payer: Self-pay | Admitting: Cardiology

## 2022-01-30 ENCOUNTER — Ambulatory Visit: Payer: Medicare Other | Admitting: Cardiology

## 2022-01-30 ENCOUNTER — Other Ambulatory Visit: Payer: Self-pay

## 2022-01-30 VITALS — BP 138/83 | HR 80 | Temp 98.4°F | Resp 17 | Ht 68.0 in | Wt 293.4 lb

## 2022-01-30 DIAGNOSIS — I4821 Permanent atrial fibrillation: Secondary | ICD-10-CM

## 2022-01-30 DIAGNOSIS — E782 Mixed hyperlipidemia: Secondary | ICD-10-CM

## 2022-01-30 DIAGNOSIS — R0609 Other forms of dyspnea: Secondary | ICD-10-CM

## 2022-01-30 DIAGNOSIS — I10 Essential (primary) hypertension: Secondary | ICD-10-CM | POA: Diagnosis not present

## 2022-01-30 MED ORDER — DABIGATRAN ETEXILATE MESYLATE 150 MG PO CAPS: 150.0000 mg | ORAL_CAPSULE | Freq: Two times a day (BID) | ORAL | 3 refills | Status: DC

## 2022-01-30 NOTE — Progress Notes (Signed)
Primary Physician/Referring:  Ma Hillock, DO  Patient ID: Ann [name], female    DOB: 1951/01/14, 71 y.o.   MRN: [id number]  Chief Complaint  Patient presents with   Atrial Fibrillation   Shortness of Breath   HPI:    Ann [name]  is a 71 y.o.  Caucasian female patient with permanent atrial fibrillation, uncontrolled diabetes, hypertension, morbid obesity with OSA unable to tolerate CPAP, hyperlipidemia, unable to tolerate statins and also does not want to be on any lipid-lowering therapy.  She was also recommended sleep apnea evaluation, however patient did not want to try nocturnal oxygen supplementation as well.  Except for chronic mild dyspnea, she is doing well. This is a 6 month visit. No new changes in symptoms since last office visit.  Past Medical History:  Diagnosis Date   Arthritis    osteoarthritis- knees.   Atrial fibrillation ([place])    Dr. Marcello Moores status post cardioversion chronic anticoagulation   Cellulitis of right lower extremity 12/30/2020   Chicken pox    Colon cancer ([place]) 2001   Radiation, chemo.  Adenocarcinoma of the colon.  Followed by Dr. Derrel Nip   Complication of anesthesia    sensitive to narcotics"extra sleepy". Versed- "no memory recall for 4 days postop"   Diabetes mellitus without complication (HCC)    oral and Insulin   Hepatitis    "sub clinical case of Hepatitis in late 70's""has immunities"   Hypertension    Hypothyroidism    Laceration of head 08/04/2019   8 cm laceration forehead/scalp after tripping when mowing her lawn.   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   Ovarian teratoma, right 09/2000   Oophorectomy   Sleep apnea    unable to tolreate cpap   Wisdom teeth extracted    Past Surgical History:  Procedure Laterality Date   ABDOMINAL HYSTERECTOMY  09/2000    Total hysterectomy and oophorectomy   ABDOMINAL  WOUND DEHISCENCE Bilateral    multiple times / mesh use with multiple abdominal hernia repair   CARDIOVERSION  2014   Unsuccessful.  Patient reports cardiac ablation was not felt to be appropriate next step by cardiology.   CHOLECYSTECTOMY  04/1998   '98 -[place]  2001   "colon cancer"   FOOT SURGERY Bilateral    multiple "hammer toes and bunionectomies"   KNEE ARTHROSCOPY Left 08/2006   meniscectomy   MELANOMA EXCISION     near scapula   TOTAL KNEE ARTHROPLASTY Right 01/24/2016   Procedure: RIGHT TOTAL KNEE ARTHROPLASTY;  Surgeon: Gaynelle Arabian, MD;  Location: [facility];  Service: Orthopedics;  Laterality: Right;   Family History  Problem Relation Age of Onset   Arthritis Mother    Colon cancer Mother    Diabetes Mother    Atrial fibrillation Mother    Diabetes Father    Heart attack Father    Heart disease Father    Stroke Father    Hypertension Maternal Grandmother    Stroke Maternal Grandmother    Stroke Maternal Grandfather    Hypertension Paternal Grandmother    Stroke Paternal Grandmother    Diabetes Paternal Grandfather    Hypertension Paternal Grandfather    Stroke Paternal Grandfather    Diabetes Brother    Brain cancer Brother    Heart disease Brother    Hypertension Brother  Stroke Brother     Social History   Tobacco Use   Smoking status: Former    Years: 25.00    Types: Cigarettes    Quit date: 01/17/1997    Years since quitting: 25.0   Smokeless tobacco: Never  Substance Use Topics   Alcohol use: No   Marital Status: Single  ROS  Review of Systems  Cardiovascular:  Positive for dyspnea on exertion. Negative for chest pain and leg swelling.  Gastrointestinal:  Negative for melena.  Objective  Blood pressure 138/83, pulse 80, temperature 98.4 F (36.9 C), temperature source Temporal, resp. rate 17, height 5\' 8"  (1.727 m), weight 293 lb 6.4 oz (133.1 kg), SpO2 98 %. Body mass index is 44.61 kg/m.  Vitals with BMI 01/30/2022  01/30/2022 11/18/2021  Height - 5\' 8"  5\' 8"   Weight - 293 lbs 6 oz 289 lbs 6 oz  BMI - 26.71 24.58  Systolic 099 833 825  Diastolic 83 87 88  Pulse 80 80 87     Physical Exam Constitutional:      Appearance: She is morbidly obese.  HENT:     Head: Atraumatic.  Neck:     Vascular: Carotid bruit (right carotid bruit) present.  Cardiovascular:     Rate and Rhythm: Normal rate. Rhythm irregular.     Pulses: Normal pulses and intact distal pulses.     Heart sounds: No murmur heard.   No gallop. No S3 or S4 sounds.  Pulmonary:     Effort: Pulmonary effort is normal.     Breath sounds: Normal breath sounds.  Abdominal:     General: Bowel sounds are normal.     Palpations: Abdomen is soft.  Musculoskeletal:        General: No swelling.   Laboratory examination:   Recent Labs    03/18/21 1153  NA 136  K 4.2  CL 98  CO2 29  GLUCOSE 164*  BUN 17  CREATININE 0.89  CALCIUM 8.9   CrCl cannot be calculated (Patient's most recent lab result is older than the maximum 21 days allowed.).  CMP Latest Ref Rng & Units 03/18/2021 08/23/2020 07/23/2020  Glucose 70 - 99 mg/dL 164(H) 199(H) 179(H)  BUN 6 - 23 mg/dL 17 29(H) 27(H)  Creatinine 0.40 - 1.20 mg/dL 0.89 1.09 0.98  Sodium 135 - 145 mEq/L 136 135 137  Potassium 3.5 - 5.1 mEq/L 4.2 3.9 3.9  Chloride 96 - 112 mEq/L 98 98 97  CO2 19 - 32 mEq/L 29 27 27   Calcium 8.4 - 10.5 mg/dL 8.9 9.2 9.5  Total Protein 6.0 - 8.3 g/dL 6.9 - -  Total Bilirubin 0.2 - 1.2 mg/dL 1.4(H) - -  Alkaline Phos 39 - 117 U/L 73 - -  AST 0 - 37 U/L 19 - -  ALT 0 - 35 U/L 16 - -   CBC Latest Ref Rng & Units 03/18/2021 04/20/2020 01/26/2016  WBC 4.0 - 10.5 K/uL 9.0 8.7 9.9  Hemoglobin 12.0 - 15.0 g/dL 15.1(H) 14.8 12.1  Hematocrit 36.0 - 46.0 % 45.0 43.8 36.1  Platelets 150.0 - 400.0 K/uL 175.0 141.0(L) 188    Lipid Panel     Component Value Date/Time   CHOL 253 (H) 04/20/2020 1053   TRIG 236.0 (H) 04/20/2020 1053   HDL 39.10 04/20/2020 1053   CHOLHDL 6  04/20/2020 1053   VLDL 47.2 (H) 04/20/2020 1053   LDLCALC 160 04/02/2019 0000   LDLDIRECT 163.0 04/20/2020 1053    HEMOGLOBIN A1C Lab  Results  Component Value Date   HGBA1C 8.5 (A) 11/18/2021   TSH Recent Labs    03/18/21 1153  TSH 2.85   Medications and allergies   Allergies  Allergen Reactions   Amoxicillin Shortness Of Breath, Swelling and Anaphylaxis    Has patient had a PCN reaction causing immediate rash, facial/tongue/throat swelling, SOB or lightheadedness with hypotension: Yes Has patient had a PCN reaction causing severe rash involving mucus membranes or skin necrosis: No Has patient had a PCN reaction that required hospitalization No Has patient had a PCN reaction occurring within the last 10 years: Yes If all of the above answers are "NO", then may proceed with Cephalosporin use.  Has patient had a PCN reaction causing immediate rash, facial/tongue/throat swelling, SOB or lightheadedness with hypotension: Yes Has patient had a PCN reaction causing severe rash involving mucus membranes or skin necrosis: No Has patient had a PCN reaction that required hospitalization No Has patient had a PCN reaction occurring within the last 10 years: Yes If all of the above answers are "NO", then may proceed with Cephalosporin use. Has patient had a PCN reaction causing immediate rash, facial/tongue/throat swelling, SOB or lightheadedness with hypotension: Yes Has patient had a PCN reaction causing severe rash involving mucus membranes or skin necrosis: No Has patient had a PCN reaction that required hospitalization No Has patient had a PCN reaction occurring within the last 10 years: Yes If all of the above answers are "NO", then may proceed with Cephalosporin use.    Codeine Itching    Tolerates hydrocodone   Ezetimibe    Livalo [Pitavastatin]    Statins Other (See Comments)    Muscle skeletal side effects   Welchol [Colesevelam]     Gi se    Current Outpatient  Medications:    ALPRAZolam (XANAX) 0.25 MG tablet, Take 1 tablet (0.25 mg total) by mouth daily as needed for anxiety., Disp: 30 tablet, Rfl: 5   Continuous Blood Gluc Receiver ([place]) DEVI, See admin instructions., Disp: , Rfl:    Continuous Blood Gluc Sensor (DEXCOM G6 SENSOR) MISC, Change every 10 days, Disp: 9 each, Rfl: 3   Continuous Blood Gluc Transmit (DEXCOM G6 TRANSMITTER) MISC, Change every 3 mos, Disp: 1 each, Rfl: 3   EPIPEN 2-PAK 0.3 MG/0.3ML SOAJ injection, Inject 0.3 mg into the skin as needed. Bee stings, Disp: , Rfl: 1   fluticasone (FLONASE) 50 MCG/ACT nasal spray, Place 2 sprays into both nostrils daily., Disp: 16 g, Rfl: 6   insulin aspart (NOVOLOG FLEXPEN) 100 UNIT/ML FlexPen, Inject 60-70 Units into the skin 3 (three) times daily with meals., Disp: 180 mL, Rfl: 3   Insulin Glargine (BASAGLAR KWIKPEN) 100 UNIT/ML, Inject 40 Units into the skin every morning., Disp: 15 mL, Rfl: 3   Lancets (ONETOUCH DELICA PLUS ZOXWRU04V) MISC, Apply 1 each topically 3 (three) times daily., Disp: , Rfl:    levothyroxine (SYNTHROID) 50 MCG tablet, Take 1 tablet (50 mcg total) by mouth daily before breakfast., Disp: 90 tablet, Rfl: 3   metoprolol tartrate (LOPRESSOR) 50 MG tablet, Take 1 tablet (50 mg total) by mouth 2 (two) times daily., Disp: 180 tablet, Rfl: 1   olmesartan-hydrochlorothiazide (BENICAR HCT) 40-25 MG tablet, Take 1 tablet by mouth daily., Disp: 90 tablet, Rfl: 1   ONETOUCH ULTRA test strip, SMARTSIG:Via Meter, Disp: , Rfl:    triamcinolone ointment (KENALOG) 0.5 %, Apply topically 2 (two) times daily as needed., Disp: , Rfl:    zolpidem (AMBIEN) 5  MG tablet, Take 1 tablet (5 mg total) by mouth at bedtime as needed., Disp: 30 tablet, Rfl: 5   dabigatran (PRADAXA) 150 MG CAPS capsule, Take 1 capsule (150 mg total) by mouth 2 (two) times daily., Disp: 200 capsule, Rfl: 3   Radiology:   No results found.  Cardiac Studies:   Lexiscan Tetrofosmin stress test  05/25/2021: Lexiscan nuclear stress test performed using 1-day protocol. SPECT images show small sized, mild intensity, reversible perfusion defect in mid to basal inferolateral myocardium. Stress LVEF 73%. Low risk study.  Echocardiogram 05/25/2021: Left ventricle cavity is normal in size. Mild concentric hypertrophy of the left ventricle. Normal LV systolic function with visual EF 55-60%. Normal global wall motion. Unable to evaluate diastolic function due to atrial fibrillation. Left atrial cavity is moderately dilated. Mild tricuspid regurgitation. Estimated pulmonary artery systolic pressure 32 mmHg. No significant change compared to previous study in 2019.  Carotid artery duplex 06/01/2021: Duplex suggests stenosis in the right internal carotid artery (<50%).  Duplex suggests stenosis in the right common carotid artery (<50%), probably minimal heterogenous plaque. Duplex suggests stenosis in the left internal carotid artery (<50%), probably minimal heterogenous plaque. Duplex suggests stenosis in the left external carotid artery (<50%). Antegrade right vertebral artery flow. Antegrade left vertebral artery flow. Follow up is appropriate if clinically indicated.   EKG:   EKG 01/30/2022: Atrial fibrillation with controlled ventricular response at rate of 72 bpm, normal axis, no evidence of ischemia, normal QT interval.  No significant change from 04/27/2021.  Assessment     ICD-10-CM   1. Dyspnea on exertion  R06.09     2. Permanent atrial fibrillation (HCC)  I48.21 EKG 12-Lead    dabigatran (PRADAXA) 150 MG CAPS capsule    3. Primary hypertension  I10     4. Mixed hyperlipidemia  E78.2       CHA2DS2-VASc Score is 4.  Yearly risk of stroke: 4.8% (A, Female, HTN, DM).  Score of 1=0.6; 2=2.2; 3=3.2; 4=4.8; 5=7.2; 6=9.8; 7=>9.8) -(CHF; HTN; vasc disease DM,  Female = 1; Age <65 =0; 65-74 = 1,  >75 =2; stroke/embolism= 2).    Medications Discontinued During This Encounter   Medication Reason   triamcinolone ointment (KENALOG) 0.5 % Duplicate   liraglutide (VICTOZA) 18 MG/3ML SOPN    dabigatran (PRADAXA) 150 MG CAPS capsule Reorder    Meds ordered this encounter  Medications   dabigatran (PRADAXA) 150 MG CAPS capsule    Sig: Take 1 capsule (150 mg total) by mouth 2 (two) times daily.    Dispense:  200 capsule    Refill:  3    Med approved 12/11/21-01/02/23    Orders Placed This Encounter  Procedures   EKG 12-Lead   Recommendations:   Ann [name] is a 71 y.o. Caucasian female patient with permanent atrial fibrillation, uncontrolled diabetes, hypertension, morbid obesity with OSA unable to tolerate CPAP, hyperlipidemia, unable to tolerate statins and also does not want to be on any lipid-lowering therapy.  She was also recommended sleep apnea evaluation, however patient did not want to try nocturnal oxygen supplementation as well.  Except for chronic mild dyspnea, she is doing well and there is no clinical evidence of heart failure, she remains asymptomatic.  Diabetes continues to be uncontrolled.  Blood pressure is well controlled, atrial fibrillation is rate controlled and she is on Pradaxa.  She does not want to change to any other medications as she feels safe with Pradaxa.  She does not want to participate  in any clinical trials either.  I reviewed her carotid artery duplex, she has very minimal bilateral heterogeneous plaque.  No further evaluation is indicated with regard to this.  We will see him back on an annual basis.  Weight loss again discussed.      Adrian Prows, MD, [facility] 01/30/2022, 11:57 AM Office: [phone]  CC: Renato Shin, MD

## 2022-02-14 ENCOUNTER — Encounter: Payer: Self-pay | Admitting: Family Medicine

## 2022-02-14 ENCOUNTER — Other Ambulatory Visit: Payer: Self-pay

## 2022-02-14 ENCOUNTER — Ambulatory Visit (INDEPENDENT_AMBULATORY_CARE_PROVIDER_SITE_OTHER): Payer: Medicare Other | Admitting: Family Medicine

## 2022-02-14 VITALS — BP 92/67 | HR 81 | Temp 97.9°F | Ht 68.0 in | Wt 292.0 lb

## 2022-02-14 DIAGNOSIS — E1169 Type 2 diabetes mellitus with other specified complication: Secondary | ICD-10-CM

## 2022-02-14 DIAGNOSIS — E034 Atrophy of thyroid (acquired): Secondary | ICD-10-CM | POA: Diagnosis not present

## 2022-02-14 DIAGNOSIS — E785 Hyperlipidemia, unspecified: Secondary | ICD-10-CM | POA: Diagnosis not present

## 2022-02-14 DIAGNOSIS — I4821 Permanent atrial fibrillation: Secondary | ICD-10-CM | POA: Diagnosis not present

## 2022-02-14 DIAGNOSIS — G479 Sleep disorder, unspecified: Secondary | ICD-10-CM

## 2022-02-14 DIAGNOSIS — F419 Anxiety disorder, unspecified: Secondary | ICD-10-CM

## 2022-02-14 DIAGNOSIS — I10 Essential (primary) hypertension: Secondary | ICD-10-CM | POA: Diagnosis not present

## 2022-02-14 LAB — COMPREHENSIVE METABOLIC PANEL
ALT: 19 U/L (ref 0–35)
AST: 21 U/L (ref 0–37)
Albumin: 4.1 g/dL (ref 3.5–5.2)
Alkaline Phosphatase: 80 U/L (ref 39–117)
BUN: 31 mg/dL — ABNORMAL HIGH (ref 6–23)
CO2: 27 mEq/L (ref 19–32)
Calcium: 9.3 mg/dL (ref 8.4–10.5)
Chloride: 99 mEq/L (ref 96–112)
Creatinine, Ser: 1.08 mg/dL (ref 0.40–1.20)
GFR: 51.29 mL/min — ABNORMAL LOW (ref >60.00)
Glucose, Bld: 212 mg/dL — ABNORMAL HIGH (ref 70–99)
Potassium: 4.1 mEq/L (ref 3.5–5.1)
Sodium: 138 mEq/L (ref 135–145)
Total Bilirubin: 1 mg/dL (ref 0.2–1.2)
Total Protein: 6.7 g/dL (ref 6.0–8.3)

## 2022-02-14 LAB — CBC
HCT: 43.4 % (ref 36.0–46.0)
Hemoglobin: 14.6 g/dL (ref 12.0–15.0)
MCHC: 33.7 g/dL (ref 30.0–36.0)
MCV: 92.7 fl (ref 78.0–100.0)
Platelets: 159 10*3/uL (ref 150.0–400.0)
RBC: 4.69 Mil/uL (ref 3.87–5.11)
RDW: 14.3 % (ref 11.5–15.5)
WBC: 7.5 10*3/uL (ref 4.0–10.5)

## 2022-02-14 LAB — LIPID PANEL
Cholesterol: 252 mg/dL — ABNORMAL HIGH (ref 0–200)
HDL: 46.8 mg/dL (ref >39.00)
NonHDL: 204.77
Total CHOL/HDL Ratio: 5
Triglycerides: 398 mg/dL — ABNORMAL HIGH (ref 0.0–149.0)
VLDL: 79.6 mg/dL — ABNORMAL HIGH (ref 0.0–40.0)

## 2022-02-14 LAB — LDL CHOLESTEROL, DIRECT: Direct LDL: 149 mg/dL

## 2022-02-14 LAB — TSH: TSH: 4.38 u[IU]/mL (ref 0.35–5.50)

## 2022-02-14 MED ORDER — OLMESARTAN MEDOXOMIL-HCTZ 40-25 MG PO TABS: 1.0000 | ORAL_TABLET | Freq: Every day | ORAL | 1 refills | Status: DC

## 2022-02-14 MED ORDER — ZOLPIDEM TARTRATE 5 MG PO TABS: 5.0000 mg | ORAL_TABLET | Freq: Every evening | ORAL | 5 refills | Status: DC | PRN

## 2022-02-14 MED ORDER — ALPRAZOLAM 0.25 MG PO TABS: 0.2500 mg | ORAL_TABLET | Freq: Every day | ORAL | 5 refills | Status: DC | PRN

## 2022-02-14 MED ORDER — METOPROLOL TARTRATE 50 MG PO TABS: 50.0000 mg | ORAL_TABLET | Freq: Two times a day (BID) | ORAL | 1 refills | Status: DC

## 2022-02-14 NOTE — Progress Notes (Signed)
Patient ID: Ann [name], female  DOB: [birth year], 71 y.o.   MRN: [id number] Patient Care Team    Relationship Specialty Notifications Start End  Ma Hillock, DO PCP - General Family Medicine  04/20/20   Adrian Prows, MD Consulting Physician Cardiology  04/20/20   Druscilla Brownie, MD Referring Physician Dermatology  04/20/20   Otelia Sergeant, OD Referring Physician   04/20/20   Luci Bank, MD Referring Physician Orthotics  04/20/20   Sonda Primes, MD Referring Physician Obstetrics and Gynecology  04/20/20   Rosana Berger, MD Referring Physician Gastroenterology  04/20/20   Renato Shin, MD Consulting Physician Endocrinology  04/27/21     Chief Complaint  Patient presents with   Anxiety    [facility]; pt is not fasting    Subjective: Ann [name] is a 71 y.o.  female present for [employer] HTN/HLD/morbid obesity/atrial fibrillation: Pt reports compliance  with Toprol 50 mg twice daily and olmesartan-HCTZ 40-25 mg daily. Blood pressures ranges at home than normal limits.  Patient denies chest pain, shortness of breath, dizziness or lower extremity edema.   .  Pt declined statin. Labs due next visit.  RF: Hypertension, hyperlipidemia, obesity, diabetes, fib, family history of heart disease  Sleep disturbance/anxiety She states with the Ambien she is able to sleep better.  She will take Xanax at times, she uses this when needed only  for increased anxiety or unable to fall asleep. Prior note: Patient presents today to discuss her Xanax use.  She uses her Xanax very infrequently.  She states she only uses Xanax when she is feeling like she is having increased anxiety or panic, which is usually preceded by her feeling that her heart rate is increasing.  She states the Xanax will resolve this feeling quite quickly.  She had a prescription that was written last May for 15 tabs of Xanax 0.25 mg and 1 refill.  She was not aware she had the refill, but is down to only 2 Xanax left and she would  like refills today.  She did not seem to realize the law and [place] in which she needs to be seen every 6 months to renew prescriptions that are controlled substances, and that they will automatically expire after 6 months whether there are refills left on the prescription or not.  Patient did sign a contract last year, we will update this today.  UDS was also collected last year. She states she still is not sleeping well without the Ambien.  She had been on Ambien 10 mg nightly in the past and then was having difficulties receiving Ambien prescriptions through her prior PCP.  We discussed today that Ambien 5 mg is the maximum recommended dose for age.  She should be able to receive this medication.  She states that she thought Medicare would not pay for this medication in people over 65. Prior note: Patient reports she had been on Ambien up until last year.  She states her prior PCP would not refill her Ambien for her.  She states she still does not sleep well, but she will manage since she cannot have the Ambien.  By records it appears she was tried on Klonopin within the last few months.  She did not feel she needed type of medication for sleep, but needed a hypnotic.  She reports she has had a prescription for Xanax that dates back to around 2018.  She uses this when she has increased anxiety or heart racing.  She reports she is used this about 9 times in 3 years.  She would like a refill on Xanax.  She states her prior PCP would not refill Xanax or Ambien, therefore she left the practice.  Depression screen [facility] 2/9 02/14/2022 09/12/2021 06/22/2021 03/18/2021 07/23/2020  Decreased Interest 0 0 0 0 0  Down, Depressed, Hopeless 0 0 0 0 0  PHQ - 2 Score 0 0 0 0 0  Altered sleeping 3 3 - - -  Tired, decreased energy 0 0 - - -  Change in appetite 0 2 - - -  Feeling bad or failure about yourself  0 0 - - -  Trouble concentrating 0 0 - - -  Moving slowly or fidgety/restless 0 0 - - -  Suicidal thoughts  0 0 - - -  PHQ-9 Score 3 5 - - -   GAD 7 : Generalized Anxiety Score 02/14/2022 09/12/2021  Nervous, Anxious, on Edge 0 1  Control/stop worrying 0 0  Worry too much - different things 0 0  Trouble relaxing 0 0  Restless 0 0  Easily annoyed or irritable 0 0  Afraid - awful might happen 0 0  Total GAD 7 Score 0 1       Fall Risk  09/12/2021 06/22/2021 03/18/2021 07/23/2020  Falls in the past year? 0 0 0 1  Number falls in past yr: 0 0 0 0  Injury with Fall? 0 0 0 1  Follow up Falls evaluation completed Falls evaluation completed;Falls prevention discussed - -    Immunization History  Administered Date(s) Administered   Fluad Quad(high Dose 65+) 09/26/2021   Influenza-Unspecified 09/26/2019   Moderna SARS-COV2 Booster Vaccination 09/02/2021   Moderna Sars-Covid-2 Vaccination 01/22/2020, 02/20/2020, 10/20/2020   Pneumococcal Conjugate-13 10/12/2014   Pneumococcal Polysaccharide-23 10/21/2019   Tdap 08/04/2019    No results found.  Past Medical History:  Diagnosis Date   Arthritis    osteoarthritis- knees.   Atrial fibrillation ([place])    Dr. Marcello Moores status post cardioversion chronic anticoagulation   Cellulitis of right lower extremity 12/30/2020   Chicken pox    Colon cancer ([place]) 2001   Radiation, chemo.  Adenocarcinoma of the colon.  Followed by Dr. Derrel Nip   Complication of anesthesia    sensitive to narcotics"extra sleepy". Versed- "no memory recall for 4 days postop"   Diabetes mellitus without complication (HCC)    oral and Insulin   Hepatitis    "sub clinical case of Hepatitis in late 70's""has immunities"   Hypertension    Hypothyroidism    Laceration of head 08/04/2019   8 cm laceration forehead/scalp after tripping when mowing her lawn.   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   Ovarian teratoma, right 09/2000   Oophorectomy   Sleep apnea    unable to  tolreate cpap   Wisdom teeth extracted    Allergies  Allergen Reactions   Amoxicillin Shortness Of Breath, Swelling and Anaphylaxis    Has patient had a PCN reaction causing immediate rash, facial/tongue/throat swelling, SOB or lightheadedness with hypotension: Yes Has patient had a PCN reaction causing severe rash involving mucus membranes or skin necrosis: No Has patient had a PCN reaction that required hospitalization No Has patient had a PCN reaction occurring within the last 10 years: Yes If all of the above answers are "NO", then may proceed with  Cephalosporin use.  Has patient had a PCN reaction causing immediate rash, facial/tongue/throat swelling, SOB or lightheadedness with hypotension: Yes Has patient had a PCN reaction causing severe rash involving mucus membranes or skin necrosis: No Has patient had a PCN reaction that required hospitalization No Has patient had a PCN reaction occurring within the last 10 years: Yes If all of the above answers are "NO", then may proceed with Cephalosporin use. Has patient had a PCN reaction causing immediate rash, facial/tongue/throat swelling, SOB or lightheadedness with hypotension: Yes Has patient had a PCN reaction causing severe rash involving mucus membranes or skin necrosis: No Has patient had a PCN reaction that required hospitalization No Has patient had a PCN reaction occurring within the last 10 years: Yes If all of the above answers are "NO", then may proceed with Cephalosporin use.    Codeine Itching    Tolerates hydrocodone   Ezetimibe    Livalo [Pitavastatin]    Statins Other (See Comments)    Muscle skeletal side effects   Welchol [Colesevelam]     Gi se   Past Surgical History:  Procedure Laterality Date   ABDOMINAL HYSTERECTOMY  09/2000    Total hysterectomy and oophorectomy   ABDOMINAL WOUND DEHISCENCE Bilateral    multiple times / mesh use with multiple abdominal hernia repair   CARDIOVERSION  2014    Unsuccessful.  Patient reports cardiac ablation was not felt to be appropriate next step by cardiology.   CHOLECYSTECTOMY  04/1998   '98 -[place]  2001   "colon cancer"   FOOT SURGERY Bilateral    multiple "hammer toes and bunionectomies"   KNEE ARTHROSCOPY Left 08/2006   meniscectomy   MELANOMA EXCISION     near scapula   TOTAL KNEE ARTHROPLASTY Right 01/24/2016   Procedure: RIGHT TOTAL KNEE ARTHROPLASTY;  Surgeon: Gaynelle Arabian, MD;  Location: [facility];  Service: Orthopedics;  Laterality: Right;   Family History  Problem Relation Age of Onset   Arthritis Mother    Colon cancer Mother    Diabetes Mother    Atrial fibrillation Mother    Diabetes Father    Heart attack Father    Heart disease Father    Stroke Father    Hypertension Maternal Grandmother    Stroke Maternal Grandmother    Stroke Maternal Grandfather    Hypertension Paternal Grandmother    Stroke Paternal Grandmother    Diabetes Paternal Grandfather    Hypertension Paternal Grandfather    Stroke Paternal Grandfather    Diabetes Brother    Brain cancer Brother    Heart disease Brother    Hypertension Brother    Stroke Brother    Social History   Social History Narrative   Marital status/children/pets: Single. G0P0.   Education/employment: Retired Oceanographer:      -smoke alarm in the home:Yes     - wears seatbelt: Yes     - Feels safe in their relationships: Yes    Allergies as of 02/14/2022       Reactions   Amoxicillin Shortness Of Breath, Swelling, Anaphylaxis   Has patient had a PCN reaction causing immediate rash, facial/tongue/throat swelling, SOB or lightheadedness with hypotension: Yes Has patient had a PCN reaction causing severe rash involving mucus membranes or skin necrosis: No Has patient had a PCN reaction that required hospitalization No Has patient had a PCN reaction occurring within the last 10 years: Yes If all of the above  answers  are "NO", then may proceed with Cephalosporin use. Has patient had a PCN reaction causing immediate rash, facial/tongue/throat swelling, SOB or lightheadedness with hypotension: Yes Has patient had a PCN reaction causing severe rash involving mucus membranes or skin necrosis: No Has patient had a PCN reaction that required hospitalization No Has patient had a PCN reaction occurring within the last 10 years: Yes If all of the above answers are "NO", then may proceed with Cephalosporin use. Has patient had a PCN reaction causing immediate rash, facial/tongue/throat swelling, SOB or lightheadedness with hypotension: Yes Has patient had a PCN reaction causing severe rash involving mucus membranes or skin necrosis: No Has patient had a PCN reaction that required hospitalization No Has patient had a PCN reaction occurring within the last 10 years: Yes If all of the above answers are "NO", then may proceed with Cephalosporin use.   Codeine Itching   Tolerates hydrocodone   Ezetimibe    Livalo [pitavastatin]    Statins Other (See Comments)   Muscle skeletal side effects   Welchol [colesevelam]    Gi se        Medication List        Accurate as of February 14, 2022 11:59 PM. If you have any questions, ask your nurse or doctor.          STOP taking these medications    fluticasone 50 MCG/ACT nasal spray Commonly known as: FLONASE Stopped by: Howard Pouch, DO   oxymetazoline 0.05 % nasal spray Commonly known as: AFRIN Stopped by: Howard Pouch, DO       TAKE these medications    ALPRAZolam 0.25 MG tablet Commonly known as: XANAX Take 1 tablet (0.25 mg total) by mouth daily as needed for anxiety.   Basaglar KwikPen 100 UNIT/ML Inject 40 Units into the skin every morning.   dabigatran 150 MG Caps capsule Commonly known as: PRADAXA Take 1 capsule (150 mg total) by mouth 2 (two) times daily.   Dexcom G6 Receiver Kerrin Mo See admin instructions.   Dexcom G6 Sensor  Misc Change every 10 days   Dexcom G6 Transmitter Misc Change every 3 mos   EpiPen 2-Pak 0.3 mg/0.3 mL Soaj injection Generic drug: EPINEPHrine Inject 0.3 mg into the skin as needed. Bee stings   levothyroxine 50 MCG tablet Commonly known as: SYNTHROID Take 1 tablet (50 mcg total) by mouth daily before breakfast.   metoprolol tartrate 50 MG tablet Commonly known as: LOPRESSOR Take 1 tablet (50 mg total) by mouth 2 (two) times daily.   NovoLOG FlexPen 100 UNIT/ML FlexPen Generic drug: insulin aspart Inject 60-70 Units into the skin 3 (three) times daily with meals.   olmesartan-hydrochlorothiazide 40-25 MG tablet Commonly known as: BENICAR HCT Take 1 tablet by mouth daily.   OneTouch Delica Plus [id number] Misc Apply 1 each topically 3 (three) times daily.   OneTouch Ultra test strip Generic drug: glucose blood SMARTSIG:Via Meter   triamcinolone ointment 0.5 % Commonly known as: KENALOG Apply topically 2 (two) times daily as needed.   zolpidem 5 MG tablet Commonly known as: AMBIEN Take 1 tablet (5 mg total) by mouth at bedtime as needed.        All past medical history, surgical history, allergies, family history, immunizations andmedications were updated in the EMR today and reviewed under the history and medication portions of their EMR.     ROS: 14 pt review of systems performed and negative (unless mentioned in an HPI)  Objective: BP 92/67    Pulse  81    Temp 97.9 F (36.6 C) (Oral)    Ht 5' 8"  (1.727 m)    Wt 292 lb (132.5 kg)    SpO2 97%    BMI 44.40 kg/m  Physical Exam Vitals and nursing note reviewed.  Constitutional:      General: She is not in acute distress.    Appearance: Normal appearance. She is obese. She is not ill-appearing, toxic-appearing or diaphoretic.  HENT:     Head: Normocephalic and atraumatic.  Eyes:     General: No scleral icterus.       Right eye: No discharge.        Left eye: No discharge.     Extraocular Movements:  Extraocular movements intact.     Conjunctiva/sclera: Conjunctivae normal.     Pupils: Pupils are equal, round, and reactive to light.  Cardiovascular:     Rate and Rhythm: Normal rate and regular rhythm.     Heart sounds: No murmur heard.   No gallop.  Pulmonary:     Effort: Pulmonary effort is normal. No respiratory distress.     Breath sounds: Normal breath sounds. No wheezing, rhonchi or rales.  Musculoskeletal:     Cervical back: Neck supple. No tenderness.     Right lower leg: No edema.     Left lower leg: No edema.  Lymphadenopathy:     Cervical: No cervical adenopathy.  Skin:    General: Skin is warm and dry.     Coloration: Skin is not jaundiced or pale.     Findings: No erythema or rash.  Neurological:     Mental Status: She is alert and oriented to person, place, and time. Mental status is at baseline.     Motor: No weakness.     Gait: Gait normal.  Psychiatric:        Mood and Affect: Mood normal.        Behavior: Behavior normal.        Thought Content: Thought content normal.        Judgment: Judgment normal.    Assessment/plan: Ann [name] is a 71 y.o. female present for [employer] Anxiety/sleep disturbance Stable.  Continue Xanax 0.25 mg daily daily as needed.  She rarely uses this medicine. She uses medications appropriately and understands risks of drug class. Continue  Ambien at the 5 mg dose for her.  She understands 5 mg is the maximum recommended dose for those in her age bracket.  If for some reason her Medicare plan will not cover this we can consider GoodRx prescription since generic Ambien is rather inexpensive. Patient understands State regulations on controlled substances and [facility] policy on prescribing controlled substances.  Contract UTD [place] controlled substance database reviewed 02/20/22 Patient aware a face-to-face follow-up will be needed if she would need refills after 6 months.  Patient aware she will need to request these  scripts they will not be automatically refilled since they are controlled substances.  Hypertension/morbid obesity:  stable  Pradaxa twice daily> cardiology prescribes Continue metoprolol 50 mg twice daily Continue  valsartan-HCTZ 40-25 daily Heart healthy diet recommended Statin declined She is established with cardio.  Cbc, cmp, tsh, lipids collected today Follow-up 5.5 months.  DM w/ hyperlipidemia:  Diabetes managed by endocrine. Cholesterol has been elevated.  Heart healthy diet and routine exercise encouraged.  Medication management has been declined by patient for her lipids.  - microalb ordered today  No follow-ups on file.  Orders Placed This Encounter  Procedures   CBC   Comp Met (CMET)   TSH   Lipid panel   Urine Microalbumin w/creat. ratio   LDL cholesterol, direct    Meds ordered this encounter  Medications   metoprolol tartrate (LOPRESSOR) 50 MG tablet    Sig: Take 1 tablet (50 mg total) by mouth 2 (two) times daily.    Dispense:  180 tablet    Refill:  1   olmesartan-hydrochlorothiazide (BENICAR HCT) 40-25 MG tablet    Sig: Take 1 tablet by mouth daily.    Dispense:  90 tablet    Refill:  1   ALPRAZolam (XANAX) 0.25 MG tablet    Sig: Take 1 tablet (0.25 mg total) by mouth daily as needed for anxiety.    Dispense:  30 tablet    Refill:  5   zolpidem (AMBIEN) 5 MG tablet    Sig: Take 1 tablet (5 mg total) by mouth at bedtime as needed.    Dispense:  30 tablet    Refill:  5    Referral Orders  No referral(s) requested today     Note is dictated utilizing voice recognition software. Although note has been proof read prior to signing, occasional typographical errors still can be missed. If any questions arise, please do not hesitate to call for verification.  Electronically signed by: Howard Pouch, DO [place]

## 2022-02-14 NOTE — Patient Instructions (Signed)
Great to see you today.  I have refilled the medication(s) we provide.   If labs were collected, we will inform you of lab results once received either by echart message or telephone call.   - echart message- for normal results that have been seen by the patient already.   - telephone call: abnormal results or if patient has not viewed results in their echart.  

## 2022-02-15 ENCOUNTER — Telehealth: Payer: Self-pay | Admitting: Family Medicine

## 2022-02-15 MED ORDER — LEVOTHYROXINE SODIUM 50 MCG PO TABS: 50.0000 ug | ORAL_TABLET | Freq: Every day | ORAL | 3 refills | Status: DC

## 2022-02-15 NOTE — Telephone Encounter (Signed)
Please call patient ?Liver and thyroid function are normal ?Blood cell counts and electrolytes are normal ?Nonfasting glucose was 212  ? ? ?Cholesterol panel looks did show improvements from a couple years ago. ? -Triglycerides are high, but she was not fasting therefore this is expected to be higher than normal range when not fasting. ?-Her LDL/bad cholesterol was 163, now 149. ?-Her HDL/good cholesterol was 39 and is now 46.8.  This is excellent and desired to be in this range. ? ?Continue to follow diabetic diet full of lean meats, fresh vegetables and fruits.  Obtain exercise as able. ? ? ? ? ?Documentation only: Patient is aware cholesterol-lowering medication is strongly advised, she has adamantly declined.  She has been agreeable to dietary modifications. ?

## 2022-02-16 NOTE — Telephone Encounter (Signed)
LVM for pt to CB regarding results.  

## 2022-02-17 ENCOUNTER — Other Ambulatory Visit: Payer: Self-pay | Admitting: Endocrinology

## 2022-02-17 ENCOUNTER — Other Ambulatory Visit: Payer: Self-pay

## 2022-02-17 ENCOUNTER — Ambulatory Visit (INDEPENDENT_AMBULATORY_CARE_PROVIDER_SITE_OTHER): Payer: Medicare Other | Admitting: Endocrinology

## 2022-02-17 VITALS — BP 126/74 | HR 77 | Ht 68.0 in | Wt 293.0 lb

## 2022-02-17 DIAGNOSIS — E785 Hyperlipidemia, unspecified: Secondary | ICD-10-CM | POA: Diagnosis not present

## 2022-02-17 DIAGNOSIS — E1169 Type 2 diabetes mellitus with other specified complication: Secondary | ICD-10-CM | POA: Diagnosis not present

## 2022-02-17 DIAGNOSIS — E119 Type 2 diabetes mellitus without complications: Secondary | ICD-10-CM

## 2022-02-17 LAB — POCT GLYCOSYLATED HEMOGLOBIN (HGB A1C): Hemoglobin A1C: 8.1 % — AB (ref 4.0–5.6)

## 2022-02-17 MED ORDER — HUMALOG KWIKPEN 200 UNIT/ML ~~LOC~~ SOPN: PEN_INJECTOR | SUBCUTANEOUS | 3 refills | Status: DC

## 2022-02-17 NOTE — Progress Notes (Signed)
Subjective:    Patient ID: Ann [name], female    DOB: 12-24-1950, 71 y.o.   MRN: [id number]  HPI Pt returns for f/u of diabetes mellitus:  DM type: Insulin-requiring type 2 Dx'ed: 7793 Complications: PAD.   Therapy: insulin since soon after dx.   GDM: never (G0) DKA: never Severe hypoglycemia: never Pancreatitis: never Pancreatic imaging: neg on 2017 CT SDOH: she gets meds from pt assist.   Other: she did not tolerate glipizide (flatulence), Trulicity (difficulty with urination), or Farxiga (vaginitis), or Trulicity (urinary retention).  She needs to lose weight, for TKR; She takes MDI.  She eats 2 meals/day (12N and 6PM). Interval history: I reviewed continuous glucose monitor data.  Glucose varies from 110-340.  It is in general highest 12MN, and less high at [facility].  It decreases overnight.  It is lowest 6AM-8AM.  She takes Novolog 60-70 units 3 times per day (AC/HS), and Basaglar 40 units QAM.  Ins declined mounjaro.  They preferred Victoza, but pt declines.   Past Medical History:  Diagnosis Date   Arthritis    osteoarthritis- knees.   Atrial fibrillation ([place])    Dr. Marcello Moores status post cardioversion chronic anticoagulation   Cellulitis of right lower extremity 12/30/2020   Chicken pox    Colon cancer ([place]) 2001   Radiation, chemo.  Adenocarcinoma of the colon.  Followed by Dr. Derrel Nip   Complication of anesthesia    sensitive to narcotics"extra sleepy". Versed- "no memory recall for 4 days postop"   Diabetes mellitus without complication (HCC)    oral and Insulin   Hepatitis    "sub clinical case of Hepatitis in late 70's""has immunities"   Hypertension    Hypothyroidism    Laceration of head 08/04/2019   8 cm laceration forehead/scalp after tripping when mowing her lawn.   Melanoma ([place])    wide excision near scapula   Mixed hyperlipidemia    Nose anomaly    septal passage narrowed "right side"- injury from dogbite.   Ovarian teratoma, right 09/2000   Oophorectomy    Sleep apnea    unable to tolreate cpap   Wisdom teeth extracted     Past Surgical History:  Procedure Laterality Date   ABDOMINAL HYSTERECTOMY  09/2000    Total hysterectomy and oophorectomy   ABDOMINAL WOUND DEHISCENCE Bilateral    multiple times / mesh use with multiple abdominal hernia repair   CARDIOVERSION  2014   Unsuccessful.  Patient reports cardiac ablation was not felt to be appropriate next step by cardiology.   CHOLECYSTECTOMY  04/1998   '98 -[place]  2001   "colon cancer"   FOOT SURGERY Bilateral    multiple "hammer toes and bunionectomies"   KNEE ARTHROSCOPY Left 08/2006   meniscectomy   MELANOMA EXCISION     near scapula   TOTAL KNEE ARTHROPLASTY Right 01/24/2016   Procedure: RIGHT TOTAL KNEE ARTHROPLASTY;  Surgeon: Gaynelle Arabian, MD;  Location: [facility];  Service: Orthopedics;  Laterality: Right;    Social History   Socioeconomic History   Marital status: Single    Spouse name: Not on file   Number of children: 0   Years of education: Not on file   Highest education level: Not on file  Occupational History   Not on file  Tobacco Use   Smoking status: Former    Years: 25.00    Types: Cigarettes    Quit date: 01/17/1997    Years since quitting: 24.1  Smokeless tobacco: Never  Vaping Use   Vaping Use: Never used  Substance and Sexual Activity   Alcohol use: No   Drug use: No   Sexual activity: Not Currently  Other Topics Concern   Not on file  Social History Narrative   Marital status/children/pets: Single. G0P0.   Education/employment: Retired Oceanographer:      -smoke alarm in the home:Yes     - wears seatbelt: Yes     - Feels safe in their relationships: Yes   Social Determinants of Health   Financial Resource Strain: Low Risk    Difficulty of Paying Living Expenses: Not hard at all  Food Insecurity: No Food Insecurity   Worried About Charity fundraiser in the Last Year: Never  true   [place] in the Last Year: Never true  Transportation Needs: No Transportation Needs   Lack of Transportation (Medical): No   Lack of Transportation (Non-Medical): No  Physical Activity: Inactive   Days of Exercise per Week: 0 days   Minutes of Exercise per Session: 0 min  Stress: No Stress Concern Present   Feeling of Stress : Not at all  Social Connections: Moderately Integrated   Frequency of Communication with Friends and Family: More than three times a week   Frequency of Social Gatherings with Friends and Family: More than three times a week   Attends Religious Services: More than 4 times per year   Active Member of [employer] or Organizations: Yes   Attends Music therapist: More than 4 times per year   Marital Status: Divorced  Human resources officer Violence: Not At Risk   Fear of Current or Ex-Partner: No   Emotionally Abused: No   Physically Abused: No   Sexually Abused: No    Current Outpatient Medications on File Prior to Visit  Medication Sig Dispense Refill   ALPRAZolam (XANAX) 0.25 MG tablet Take 1 tablet (0.25 mg total) by mouth daily as needed for anxiety. 30 tablet 5   Continuous Blood Gluc Receiver (DEXCOM G6 RECEIVER) DEVI See admin instructions.     Continuous Blood Gluc Sensor (DEXCOM G6 SENSOR) MISC Change every 10 days 9 each 3   Continuous Blood Gluc Transmit (DEXCOM G6 TRANSMITTER) MISC Change every 3 mos 1 each 3   dabigatran (PRADAXA) 150 MG CAPS capsule Take 1 capsule (150 mg total) by mouth 2 (two) times daily. 200 capsule 3   EPIPEN 2-PAK 0.3 MG/0.3ML SOAJ injection Inject 0.3 mg into the skin as needed. Bee stings  1   Insulin Glargine (BASAGLAR KWIKPEN) 100 UNIT/ML Inject 40 Units into the skin every morning. 15 mL 3   Lancets (ONETOUCH DELICA PLUS [id number]) MISC Apply 1 each topically 3 (three) times daily.     levothyroxine (SYNTHROID) 50 MCG tablet Take 1 tablet (50 mcg total) by mouth daily before breakfast. 90 tablet 3    metoprolol tartrate (LOPRESSOR) 50 MG tablet Take 1 tablet (50 mg total) by mouth 2 (two) times daily. 180 tablet 1   olmesartan-hydrochlorothiazide (BENICAR HCT) 40-25 MG tablet Take 1 tablet by mouth daily. 90 tablet 1   ONETOUCH ULTRA test strip SMARTSIG:Via Meter     triamcinolone ointment (KENALOG) 0.5 % Apply topically 2 (two) times daily as needed.     zolpidem (AMBIEN) 5 MG tablet Take 1 tablet (5 mg total) by mouth at bedtime as needed. 30 tablet 5   No current facility-administered medications on file prior to visit.  Allergies  Allergen Reactions   Amoxicillin Shortness Of Breath, Swelling and Anaphylaxis    Has patient had a PCN reaction causing immediate rash, facial/tongue/throat swelling, SOB or lightheadedness with hypotension: Yes Has patient had a PCN reaction causing severe rash involving mucus membranes or skin necrosis: No Has patient had a PCN reaction that required hospitalization No Has patient had a PCN reaction occurring within the last 10 years: Yes If all of the above answers are "NO", then may proceed with Cephalosporin use.  Has patient had a PCN reaction causing immediate rash, facial/tongue/throat swelling, SOB or lightheadedness with hypotension: Yes Has patient had a PCN reaction causing severe rash involving mucus membranes or skin necrosis: No Has patient had a PCN reaction that required hospitalization No Has patient had a PCN reaction occurring within the last 10 years: Yes If all of the above answers are "NO", then may proceed with Cephalosporin use. Has patient had a PCN reaction causing immediate rash, facial/tongue/throat swelling, SOB or lightheadedness with hypotension: Yes Has patient had a PCN reaction causing severe rash involving mucus membranes or skin necrosis: No Has patient had a PCN reaction that required hospitalization No Has patient had a PCN reaction occurring within the last 10 years: Yes If all of the above answers are "NO", then  may proceed with Cephalosporin use.    Codeine Itching    Tolerates hydrocodone   Ezetimibe    Livalo [Pitavastatin]    Statins Other (See Comments)    Muscle skeletal side effects   Welchol [Colesevelam]     Gi se    Family History  Problem Relation Age of Onset   Arthritis Mother    Colon cancer Mother    Diabetes Mother    Atrial fibrillation Mother    Diabetes Father    Heart attack Father    Heart disease Father    Stroke Father    Hypertension Maternal Grandmother    Stroke Maternal Grandmother    Stroke Maternal Grandfather    Hypertension Paternal Grandmother    Stroke Paternal Grandmother    Diabetes Paternal Grandfather    Hypertension Paternal Grandfather    Stroke Paternal Grandfather    Diabetes Brother    Brain cancer Brother    Heart disease Brother    Hypertension Brother    Stroke Brother     BP 126/74    Pulse 77    Ht '5\' 8"'$  (1.727 m)    Wt 293 lb (132.9 kg)    SpO2 94%    BMI 44.55 kg/m   Review of Systems     Objective:   Physical Exam    Lab Results  Component Value Date   HGBA1C 8.1 (A) 02/17/2022      Assessment & Plan:  Insulin-requiring type 2 DM: uncontrolled  Patient Instructions  check your blood sugar twice a day.  vary the time of day when you check, between before the 3 meals, and at bedtime.  also check if you have symptoms of your blood sugar being too high or too low.  please keep a record of the readings and bring it to your next appointment here (or you can bring the meter itself).  You can write it on any piece of paper.  please call us sooner if your blood sugar goes below 70, or if you have a lot of readings over 200.    I have sent a prescription to your pharmacy, to change to double-strength Humalog.   Please continue  the same Basaglar  Please come back for a follow-up appointment in 2 months.

## 2022-02-17 NOTE — Patient Instructions (Addendum)
check your blood sugar twice a day.  vary the time of day when you check, between before the 3 meals, and at bedtime.  also check if you have symptoms of your blood sugar being too high or too low.  please keep a record of the readings and bring it to your next appointment here (or you can bring the meter itself).  You can write it on any piece of paper.  please call us sooner if your blood sugar goes below 70, or if you have a lot of readings over 200.    ?I have sent a prescription to your pharmacy, to change to double-strength Humalog.   ?Please continue the same Basaglar  ?Please come back for a follow-up appointment in 2 months.   ?

## 2022-02-17 NOTE — Telephone Encounter (Signed)
Spoke with pt regarding results/recommendations,voiced understanding. ? ?Pt asked what she can/should do about her GRF to bring it up from 52. ?

## 2022-02-20 NOTE — Telephone Encounter (Signed)
LVM for pt to CB regarding results questions.  

## 2022-02-20 NOTE — Telephone Encounter (Signed)
Spoke with pt regarding labs and instructions.   

## 2022-02-20 NOTE — Telephone Encounter (Signed)
General recommendations for protection his kidney function is to hydrate, avoid NSAIDs and control of chronic conditions such as diabetes and hypertension will help protect kidney function. ?

## 2022-02-21 ENCOUNTER — Telehealth: Payer: Self-pay

## 2022-02-21 NOTE — Telephone Encounter (Signed)
[facility] requesting clarification on how many times patient is to use the pen needles since the Humalog is  ?70-80 UNITS TWICE A DAY (BEFORE MEALS),  ?AND 10 UNITS AT BEDTIME,  ?With PEN NEEDLES 4/DAY  ? They would like to clarify is the patient to use them 3 or 4x/day ?

## 2022-02-23 ENCOUNTER — Other Ambulatory Visit (HOSPITAL_COMMUNITY): Payer: Self-pay

## 2022-02-23 NOTE — Telephone Encounter (Signed)
The Humalog is non-formulary.  Covered meds./preferred are [employer] flexpens.  They each have a $35 copay. ?

## 2022-03-02 ENCOUNTER — Other Ambulatory Visit (HOSPITAL_COMMUNITY): Payer: Self-pay

## 2022-03-06 ENCOUNTER — Other Ambulatory Visit: Payer: Self-pay | Admitting: Endocrinology

## 2022-03-06 MED ORDER — NOVOLOG FLEXPEN 100 UNIT/ML ~~LOC~~ SOPN
70.0000 [IU] | PEN_INJECTOR | Freq: Two times a day (BID) | SUBCUTANEOUS | 3 refills | Status: DC
Start: 2022-03-06 — End: 2022-03-07

## 2022-03-06 NOTE — Telephone Encounter (Signed)
The Humalog is non-formulary and not covered.  Covered meds./preferred are [employer] flexpens.  They each have a $35 copay. Please advise an alternative ?

## 2022-03-07 ENCOUNTER — Other Ambulatory Visit: Payer: Self-pay | Admitting: Endocrinology

## 2022-03-07 MED ORDER — NOVOLOG FLEXPEN 100 UNIT/ML ~~LOC~~ SOPN: 70.0000 [IU] | PEN_INJECTOR | Freq: Two times a day (BID) | SUBCUTANEOUS | 3 refills | Status: DC

## 2022-03-14 ENCOUNTER — Other Ambulatory Visit: Payer: Self-pay

## 2022-03-14 DIAGNOSIS — E119 Type 2 diabetes mellitus without complications: Secondary | ICD-10-CM

## 2022-03-14 MED ORDER — NOVOFINE PEN NEEDLE 32G X 6 MM MISC: 1 refills | Status: DC

## 2022-03-15 ENCOUNTER — Other Ambulatory Visit: Payer: Self-pay | Admitting: Endocrinology

## 2022-03-15 ENCOUNTER — Encounter: Payer: Self-pay | Admitting: Endocrinology

## 2022-03-15 ENCOUNTER — Telehealth: Payer: Self-pay

## 2022-03-15 DIAGNOSIS — E119 Type 2 diabetes mellitus without complications: Secondary | ICD-10-CM

## 2022-03-15 MED ORDER — BASAGLAR KWIKPEN 100 UNIT/ML ~~LOC~~ SOPN: 40.0000 [IU] | PEN_INJECTOR | SUBCUTANEOUS | 3 refills | Status: DC

## 2022-03-15 MED ORDER — NOVOLOG FLEXPEN 100 UNIT/ML ~~LOC~~ SOPN: 70.0000 [IU] | PEN_INJECTOR | Freq: Two times a day (BID) | SUBCUTANEOUS | 3 refills | Status: DC

## 2022-03-15 NOTE — Telephone Encounter (Signed)
Patient also requests renew script for Basalgar. Okay to send in? ?

## 2022-03-16 ENCOUNTER — Other Ambulatory Visit: Payer: Self-pay

## 2022-03-16 ENCOUNTER — Encounter: Payer: Self-pay | Admitting: Endocrinology

## 2022-03-16 DIAGNOSIS — E119 Type 2 diabetes mellitus without complications: Secondary | ICD-10-CM

## 2022-03-16 DIAGNOSIS — E785 Hyperlipidemia, unspecified: Secondary | ICD-10-CM

## 2022-03-16 MED ORDER — NOVOFINE PEN NEEDLE 32G X 6 MM MISC: 6 refills | Status: DC

## 2022-03-16 MED ORDER — BASAGLAR KWIKPEN 100 UNIT/ML ~~LOC~~ SOPN: 40.0000 [IU] | PEN_INJECTOR | SUBCUTANEOUS | 3 refills | Status: DC

## 2022-03-16 MED ORDER — NOVOFINE PEN NEEDLE 32G X 6 MM MISC: 5 refills | Status: DC

## 2022-03-16 NOTE — Telephone Encounter (Signed)
RX for pen needles and Nancee Liter has now been sent. ?

## 2022-04-06 ENCOUNTER — Telehealth: Payer: Self-pay

## 2022-04-06 ENCOUNTER — Other Ambulatory Visit: Payer: Self-pay

## 2022-04-06 DIAGNOSIS — E119 Type 2 diabetes mellitus without complications: Secondary | ICD-10-CM

## 2022-04-06 MED ORDER — NOVOFINE PEN NEEDLE 32G X 6 MM MISC: 6 refills | Status: DC

## 2022-04-06 NOTE — Telephone Encounter (Signed)
Patient LVM regarding Humalog. Says that she spoke with her insurance and the PA has been denied due to the questions not being answered correctly. Are you all able to check on this? Thank you! ? ?

## 2022-04-07 ENCOUNTER — Other Ambulatory Visit (HOSPITAL_COMMUNITY): Payer: Self-pay

## 2022-04-07 NOTE — Telephone Encounter (Signed)
Pt gets Novolog, and does not need a PA. It was filled and processed through her [facility] mail order on 04.5.23. Next earliest fill will be 6.25.23.

## 2022-04-08 ENCOUNTER — Other Ambulatory Visit: Payer: Self-pay | Admitting: Endocrinology

## 2022-04-08 DIAGNOSIS — E119 Type 2 diabetes mellitus without complications: Secondary | ICD-10-CM

## 2022-04-21 ENCOUNTER — Encounter: Payer: Self-pay | Admitting: Internal Medicine

## 2022-04-21 ENCOUNTER — Telehealth: Payer: Self-pay

## 2022-04-21 ENCOUNTER — Ambulatory Visit (INDEPENDENT_AMBULATORY_CARE_PROVIDER_SITE_OTHER): Payer: Medicare Other | Admitting: Internal Medicine

## 2022-04-21 ENCOUNTER — Other Ambulatory Visit: Payer: Self-pay

## 2022-04-21 ENCOUNTER — Telehealth: Payer: Self-pay | Admitting: Internal Medicine

## 2022-04-21 ENCOUNTER — Ambulatory Visit: Payer: Medicare Other | Admitting: Endocrinology

## 2022-04-21 VITALS — BP 124/70 | HR 70 | Ht 68.0 in | Wt 290.0 lb

## 2022-04-21 DIAGNOSIS — E119 Type 2 diabetes mellitus without complications: Secondary | ICD-10-CM

## 2022-04-21 DIAGNOSIS — E785 Hyperlipidemia, unspecified: Secondary | ICD-10-CM | POA: Diagnosis not present

## 2022-04-21 DIAGNOSIS — E1169 Type 2 diabetes mellitus with other specified complication: Secondary | ICD-10-CM

## 2022-04-21 DIAGNOSIS — E1165 Type 2 diabetes mellitus with hyperglycemia: Secondary | ICD-10-CM

## 2022-04-21 DIAGNOSIS — E010 Iodine-deficiency related diffuse (endemic) goiter: Secondary | ICD-10-CM | POA: Diagnosis not present

## 2022-04-21 DIAGNOSIS — R739 Hyperglycemia, unspecified: Secondary | ICD-10-CM

## 2022-04-21 LAB — POCT GLYCOSYLATED HEMOGLOBIN (HGB A1C): Hemoglobin A1C: 8.6 % — AB (ref 4.0–5.6)

## 2022-04-21 MED ORDER — BASAGLAR KWIKPEN 100 UNIT/ML ~~LOC~~ SOPN: 50.0000 [IU] | PEN_INJECTOR | SUBCUTANEOUS | 3 refills | Status: DC

## 2022-04-21 MED ORDER — HUMALOG KWIKPEN 200 UNIT/ML ~~LOC~~ SOPN: PEN_INJECTOR | SUBCUTANEOUS | 3 refills | Status: DC

## 2022-04-21 MED ORDER — NOVOFINE PEN NEEDLE 32G X 6 MM MISC: 1.0000 | Freq: Four times a day (QID) | 3 refills | Status: DC

## 2022-04-21 NOTE — Telephone Encounter (Signed)
Patient's appt has been rescheduled to 06/19/2022 at 1:45 with Dr. Dwyane Dee for DM. She mentioned that she has been experiencing several low blood sugars at night She says that she has been uncomfortable with taking Novolog 70-80 units 2 times daily and 10 units in the evening where she was previously on 45-55 units. She also mentioned that she did not want to do multiple injections of the Novolog since the pen only goes up to 60 units and was prescribed Humalog as an alternative which was denied and decided to stay on Novolog with adjusting her own dosage to 45-55 units. She is also taking 40 units of Basaglar every morning and wants advice as to what she may do to prevent her low blood sugars or if she may need to come sooner than her appt in July ?

## 2022-04-21 NOTE — Patient Instructions (Addendum)
Use Skin- tac liquid  on the skin just before you apply dexcom sensor  ? ? ?- Increase Basaglar to 50 units daily  ?- Novolog 60 units with each meal  ?-Novlog correctional insulin: ADD extra units on insulin to your meal-time Novolog dose if your blood sugars are higher than 145. Use the scale below to help guide you:  ? ?Blood sugar before meal Number of units to inject  ?Less than 145 0 unit  ?146 -  160 1 units  ?161 -  175 2 units  ?176 -  190 3 units  ?191 -  205 4 units  ?206 -  220 5 units  ?221 -  230 6 units  ?231 -  250 7 units  ?251 -  275 8 units  ?276 - 290 9 units   ? ? ? ? ? ?HOW TO TREAT LOW BLOOD SUGARS (Blood sugar LESS THAN 70 MG/DL) ?Please follow the RULE OF 15 for the treatment of hypoglycemia treatment (when your (blood sugars are less than 70 mg/dL)  ? ?STEP 1: Take 15 grams of carbohydrates when your blood sugar is low, which includes:  ?3-4 GLUCOSE TABS  OR ?3-4 OZ OF JUICE OR REGULAR SODA OR ?ONE TUBE OF GLUCOSE GEL   ? ?STEP 2: RECHECK blood sugar in 15 MINUTES ?STEP 3: If your blood sugar is still low at the 15 minute recheck --> then, go back to STEP 1 and treat AGAIN with another 15 grams of carbohydrates. ? ?

## 2022-04-21 NOTE — Telephone Encounter (Signed)
Can you please start PA for Humalog U-200? ? ?It is not on the formulary but she is on high dose of insulin  ? ? ?Thanks  ?Abby Nena Jordan, MD ? ?[facility]  ?[facility] ?[address] ?[place], [place] [address] ?Phone: [phone] ?FAX: [phone] ? ?

## 2022-04-21 NOTE — Telephone Encounter (Signed)
Error note not needed

## 2022-04-21 NOTE — Progress Notes (Signed)
?Name: Ann [name]  ?Age/ Sex: 71 y.o., female   ?MRN/ DOB: [id number], 02/06/1951    ? ?PCP: Howard Pouch A, DO   ?Reason for Endocrinology Evaluation: Type 2 Diabetes Mellitus  ?Initial Endocrine Consultative Visit: 2021  ? ? ?PATIENT IDENTIFIER: Ann [name] is a 71 y.o. female with a past medical history of T2Dm, A. Fib ,HTn and dyslipidemia, Has hx of colon cancer  . The patient has followed with Endocrinology clinic since 2021 for consultative assistance with management of her diabetes. ? ?DIABETIC HISTORY:  ?Ann [name] was diagnosed with DM in 2013. Intolerant to Metformin.Farxiga and invokana caused genital irritation.Trulicity -caused urinary retention . Her hemoglobin A1c has ranged from 7.6% in 2022, peaking at 8.8% in 2022. ? ? ?SUBJECTIVE:  ? ?During the last visit (02/17/2022): Saw Dr. Loanne Drilling . A1c 8.1 %  ? ?Today (04/21/2022): Ann [name] is here for a follow up on diabetes management .She checks her blood sugars multiple times a day through CGM. The patient has had hypoglycemic episodes since the last clinic visit, she is symptomatic with these episodes ? ? ?She is having issues with insulin supplies.  She was advised by Dr. Loanne Drilling to increase NovoLog but the max dose at 1 time is 60 units and she does not wish to give herself more NovoLog injection per meal.  He tried to prescribe her Humalog but this was not on the formulary.   ? ? ?She has a dexcom but developed a rash  ? ?This afternoon BG was 220 , took 28 units of Novolog  ?She avoids sugar sweetened beverages  ? ?Denies vomiting or diarrhea but has this morning has nausea  ?She has a local neck swelling on the right , that has been stable  ? ? ?HOME DIABETES REGIMEN:  ?Basaglar 40 units daily  ?Novolog 40-60 units TID QAC ? ? ? ? ?Statin: yes ?ACE-I/ARB: yes ? ? ? ?CONTINUOUS GLUCOSE MONITORING RECORD INTERPRETATION   ? ?Dates of Recording: 4/28-5/10/2022 ? ?Sensor description:dexcom ? ?Results statistics: ?  ?CGM use % of  time 79  ?Average and SD 204/66  ?Time in range     42   %  ?% Time Above 180 34  ?% Time above 250 24  ?% Time Below target 0  ? ? ? ? ?Glycemic patterns summary: hyperglycemia during the day and night. BG's at goal between 3-6 am  ? ?Hyperglycemic episodes  post lunch ? ?Hypoglycemic episodes occurred n/a ? ?Overnight periods: high ? ? ? ? ? ?DIABETIC COMPLICATIONS: ?Microvascular complications:  ?Denies:  ?Last Eye Exam: Completed  ? ?Macrovascular complications:  ? ?Denies: CAD, CVA, PVD ? ? ?HISTORY:  ?Past Medical History:  ?Past Medical History:  ?Diagnosis Date  ? Arthritis   ? osteoarthritis- knees.  ? Atrial fibrillation ([place])   ? Dr. Marcello Moores status post cardioversion chronic anticoagulation  ? Cellulitis of right lower extremity 12/30/2020  ? Chicken pox   ? Colon cancer ([place]) 2001  ? Radiation, chemo.  Adenocarcinoma of the colon.  Followed by Dr. Derrel Nip  ? Complication of anesthesia   ? sensitive to narcotics"extra sleepy". Versed- "no memory recall for 4 days postop"  ? Diabetes mellitus without complication ([place])   ? oral and Insulin  ? Hepatitis   ? "sub clinical case of Hepatitis in late 70's""has immunities"  ? Hypertension   ? Hypothyroidism   ? Laceration of head 08/04/2019  ? 8 cm laceration forehead/scalp after tripping when mowing her lawn.  ?  Melanoma ([place])   ? wide excision near scapula  ? Mixed hyperlipidemia   ? Nose anomaly   ? septal passage narrowed "right side"- injury from dogbite.  ? Ovarian teratoma, right 09/2000  ? Oophorectomy  ? Sleep apnea   ? unable to tolreate cpap  ? Wisdom teeth extracted   ? ?Past Surgical History:  ?Past Surgical History:  ?Procedure Laterality Date  ? ABDOMINAL HYSTERECTOMY  09/2000  ?  Total hysterectomy and oophorectomy  ? ABDOMINAL WOUND DEHISCENCE Bilateral   ? multiple times / mesh use with multiple abdominal hernia repair  ? CARDIOVERSION  2014  ? Unsuccessful.  Patient reports cardiac ablation was not felt to be appropriate next step by cardiology.   ? CHOLECYSTECTOMY  04/1998  ? '98 -[facility]  ? COLON RESECTION  2001  ? "colon cancer"  ? FOOT SURGERY Bilateral   ? multiple "hammer toes and bunionectomies"  ? KNEE ARTHROSCOPY Left 08/2006  ? meniscectomy  ? MELANOMA EXCISION    ? near scapula  ? TOTAL KNEE ARTHROPLASTY Right 01/24/2016  ? Procedure: RIGHT TOTAL KNEE ARTHROPLASTY;  Surgeon: Gaynelle Arabian, MD;  Location: [facility];  Service: Orthopedics;  Laterality: Right;  ? ?Social History:  reports that she quit smoking about 25 years ago. Her smoking use included cigarettes. She has never used smokeless tobacco. She reports that she does not drink alcohol and does not use drugs. ?Family History:  ?Family History  ?Problem Relation Age of Onset  ? Arthritis Mother   ? Colon cancer Mother   ? Diabetes Mother   ? Atrial fibrillation Mother   ? Diabetes Father   ? Heart attack Father   ? Heart disease Father   ? Stroke Father   ? Hypertension Maternal Grandmother   ? Stroke Maternal Grandmother   ? Stroke Maternal Grandfather   ? Hypertension Paternal Grandmother   ? Stroke Paternal Grandmother   ? Diabetes Paternal Grandfather   ? Hypertension Paternal Grandfather   ? Stroke Paternal Grandfather   ? Diabetes Brother   ? Brain cancer Brother   ? Heart disease Brother   ? Hypertension Brother   ? Stroke Brother   ? ? ? ?HOME MEDICATIONS: ?Allergies as of 04/21/2022   ? ?   Reactions  ? Amoxicillin Shortness Of Breath, Swelling, Anaphylaxis  ? Has patient had a PCN reaction causing immediate rash, facial/tongue/throat swelling, SOB or lightheadedness with hypotension: Yes ?Has patient had a PCN reaction causing severe rash involving mucus membranes or skin necrosis: No ?Has patient had a PCN reaction that required hospitalization No ?Has patient had a PCN reaction occurring within the last 10 years: Yes ?If all of the above answers are "NO", then may proceed with Cephalosporin use. ?Has patient had a PCN reaction causing immediate rash, facial/tongue/throat  swelling, SOB or lightheadedness with hypotension: Yes ?Has patient had a PCN reaction causing severe rash involving mucus membranes or skin necrosis: No ?Has patient had a PCN reaction that required hospitalization No ?Has patient had a PCN reaction occurring within the last 10 years: Yes ?If all of the above answers are "NO", then may proceed with Cephalosporin use. ?Has patient had a PCN reaction causing immediate rash, facial/tongue/throat swelling, SOB or lightheadedness with hypotension: Yes ?Has patient had a PCN reaction causing severe rash involving mucus membranes or skin necrosis: No ?Has patient had a PCN reaction that required hospitalization No ?Has patient had a PCN reaction occurring within the last 10 years: Yes ?If all of the  above answers are "NO", then may proceed with Cephalosporin use.  ? Codeine Itching  ? Tolerates hydrocodone  ? Ezetimibe   ? Livalo [pitavastatin]   ? Statins Other (See Comments)  ? Muscle skeletal side effects  ? Welchol [colesevelam]   ? Gi se  ? ?  ? ?  ?Medication List  ?  ? ?  ? Accurate as of Apr 21, 2022  3:47 PM. If you have any questions, ask your nurse or doctor.  ?  ?  ? ?  ? ?STOP taking these medications   ? ?NovoLOG FlexPen 100 UNIT/ML FlexPen ?Generic drug: insulin aspart ?Stopped by: Dorita Sciara, MD ?  ? ?  ? ?TAKE these medications   ? ?ALPRAZolam 0.25 MG tablet ?Commonly known as: Duanne Moron ?Take 1 tablet (0.25 mg total) by mouth daily as needed for anxiety. ?  ?Basaglar KwikPen 100 UNIT/ML ?Inject 50 Units into the skin every morning. ?What changed: how much to take ?Changed by: Dorita Sciara, MD ?  ?dabigatran 150 MG Caps capsule ?Commonly known as: PRADAXA ?Take 1 capsule (150 mg total) by mouth 2 (two) times daily. ?  ?Dexcom G6 Receiver Kerrin Mo ?See admin instructions. ?  ?Dexcom G6 Sensor Misc ?1 DEVICE BY DOES NOT APPLY ROUTE SEE ADMIN INSTRUCTIONS. CHANGE EVERY 10 DAYS ?  ?Dexcom G6 Transmitter Misc ?Change every 3 mos ?  ?EpiPen 2-Pak 0.3  mg/0.3 mL Soaj injection ?Generic drug: EPINEPHrine ?Inject 0.3 mg into the skin as needed. Bee stings ?  ?HumaLOG KwikPen 200 UNIT/ML KwikPen ?Generic drug: insulin lispro ?Max daily 200 units ?Started by: I

## 2022-04-24 ENCOUNTER — Telehealth: Payer: Self-pay | Admitting: Pharmacy Technician

## 2022-04-24 ENCOUNTER — Other Ambulatory Visit (HOSPITAL_COMMUNITY): Payer: Self-pay

## 2022-04-25 ENCOUNTER — Encounter: Payer: Self-pay | Admitting: Internal Medicine

## 2022-04-25 NOTE — Telephone Encounter (Signed)
I will have to see if we have the denial letter to see why it was denied, or call her ins, but I'll look into it and let you know what I find out. ?

## 2022-04-25 NOTE — Telephone Encounter (Signed)
Called [employer] and had to leave a message. I have been unable to find the previous denial. ?

## 2022-05-01 DIAGNOSIS — Z794 Long term (current) use of insulin: Secondary | ICD-10-CM | POA: Diagnosis not present

## 2022-05-01 DIAGNOSIS — E119 Type 2 diabetes mellitus without complications: Secondary | ICD-10-CM | POA: Diagnosis not present

## 2022-05-01 DIAGNOSIS — H2513 Age-related nuclear cataract, bilateral: Secondary | ICD-10-CM | POA: Diagnosis not present

## 2022-05-01 LAB — HM DIABETES EYE EXAM

## 2022-05-02 ENCOUNTER — Other Ambulatory Visit (HOSPITAL_COMMUNITY): Payer: Self-pay

## 2022-05-02 NOTE — Telephone Encounter (Signed)
After receiving no communication back from [facility] solutions I called again. Unable to speak with anyone or get any useful information. Called the 800# for the patients pharmacy insurance and was able to find out the pt initiated the first prior authorization request on March 14th and it was denied on March 23 because they didn't receive any clinical documentation. It was requested again, this time from the office on March 23, but denied on March 29th.  I explained that the formulary medications didn't come in a higher concentration, which the pt needs, therefore she is going to reopen the case and initiate an expedited request. We should have a determination within 72 hours.

## 2022-05-03 ENCOUNTER — Ambulatory Visit
Admission: RE | Admit: 2022-05-03 | Discharge: 2022-05-03 | Disposition: A | Discharge status: Home / Self Care | Payer: Medicare Other | Source: Ambulatory Visit | Attending: Internal Medicine | Admitting: Internal Medicine

## 2022-05-03 DIAGNOSIS — E010 Iodine-deficiency related diffuse (endemic) goiter: Secondary | ICD-10-CM

## 2022-05-12 ENCOUNTER — Telehealth: Payer: Self-pay

## 2022-05-12 NOTE — Telephone Encounter (Signed)
Pt stated that she has approx 30 pills left in bottle. Pt was informed to call pharm a week prior to needing refills to see if they still had rx on back order and to call us if it still is at that time.

## 2022-05-12 NOTE — Telephone Encounter (Signed)
Patient refill request.  States [facility] not have in stock and not sure when will b in stock. Rph told her to ask pcp to change to .5 mg 15 d/s and she can cut pill in half  [facility]  ALPRAZolam (XANAX) 0.25 MG tablet [[id number]]

## 2022-06-06 ENCOUNTER — Telehealth: Payer: Self-pay

## 2022-06-07 NOTE — Telephone Encounter (Signed)
Ann [name] (Key: [id number]) Zolpidem Tartrate '5MG'$  tablets Status: Question Response - N/ACreated: June 26th, 2023 [phone]  States pending PA exist

## 2022-06-07 NOTE — Telephone Encounter (Signed)
Pt aware PA has been started.

## 2022-06-07 NOTE — Telephone Encounter (Signed)
PA needed. Ins will only cover 90/yr

## 2022-06-08 NOTE — Telephone Encounter (Signed)
Approved 05/03/22 Patient Advocate Encounter  Prior Authorization has been approved.    Effective dates: 12/11/21 through 05/02/23 Letter in Media

## 2022-06-10 ENCOUNTER — Other Ambulatory Visit: Payer: Self-pay | Admitting: Family Medicine

## 2022-06-12 NOTE — Telephone Encounter (Signed)
Representative calling from [employer] regarding Prior Auth.  They have denied PA because of insufficient notes and/or questions that were not answered in its entirety. Patient called [employer] to discuss, patient very upset.  I told representative Dr. Raoul Pitch and clinical staff not in office today, will resume on Wednesday 7/5. They will send fax to our office explaining denial.

## 2022-06-12 NOTE — Telephone Encounter (Signed)
Please call patient to keep her in loop regarding prior auth approval process.  She states that [employer] faxed our office 3 pages to complete for Prior Auth.  Only 1 page was faxed back and that is the reason why [employer] denied prior auth.

## 2022-06-12 NOTE — Telephone Encounter (Signed)
Noted. Awaiting PA denial via fax with any other forms applicable for sending notes/questions not previously answered.

## 2022-06-12 NOTE — Telephone Encounter (Signed)
Ann [name] phone number is [phone]

## 2022-06-14 ENCOUNTER — Other Ambulatory Visit: Payer: Self-pay | Admitting: Family Medicine

## 2022-06-14 NOTE — Telephone Encounter (Signed)
Pt informed that paperwork has been faxed.

## 2022-06-19 ENCOUNTER — Ambulatory Visit: Payer: Medicare Other | Admitting: Endocrinology

## 2022-07-05 ENCOUNTER — Ambulatory Visit (INDEPENDENT_AMBULATORY_CARE_PROVIDER_SITE_OTHER): Payer: Medicare Other

## 2022-07-05 DIAGNOSIS — Z0000 Encounter for general adult medical examination without abnormal findings: Secondary | ICD-10-CM | POA: Diagnosis not present

## 2022-07-05 NOTE — Patient Instructions (Signed)
Ann [name] , Thank you for taking time to come for your Medicare Wellness Visit. I appreciate your ongoing commitment to your health goals. Please review the following plan we discussed and let me know if I can assist you in the future.   Screening recommendations/referrals: Colonoscopy: done 09/06/18 repeat every 5 years  Mammogram: pt will call  Bone Density: done 03/30/17 repeat every 2 years  Recommended yearly ophthalmology/optometry visit for glaucoma screening and checkup Recommended yearly dental visit for hygiene and checkup  Vaccinations: Influenza vaccine: done 09/26/21 repeat every year  Pneumococcal vaccine: Up to date Tdap vaccine: done 08/04/19 repeat every 10 years  Shingles vaccine: Shingrix discussed. Please contact your pharmacy for coverage information.    Covid-19:Completed 2/11, 3/12, 10/20/20 & 09/02/21  Advanced directives: Please bring a copy of your health care power of attorney and living will to the office at your convenience.  Conditions/risks identified: wants to lose weight   Next appointment: Follow up in one year for your annual wellness visit    Preventive Care 65 Years and Older, Female Preventive care refers to lifestyle choices and visits with your health care provider that can promote health and wellness. What does preventive care include? A yearly physical exam. This is also called an annual well check. Dental exams once or twice a year. Routine eye exams. Ask your health care provider how often you should have your eyes checked. Personal lifestyle choices, including: Daily care of your teeth and gums. Regular physical activity. Eating a healthy diet. Avoiding tobacco and drug use. Limiting alcohol use. Practicing safe sex. Taking low-dose aspirin every day. Taking vitamin and mineral supplements as recommended by your health care provider. What happens during an annual well check? The services and screenings done by your health care  provider during your annual well check will depend on your age, overall health, lifestyle risk factors, and family history of disease. Counseling  Your health care provider may ask you questions about your: Alcohol use. Tobacco use. Drug use. Emotional well-being. Home and relationship well-being. Sexual activity. Eating habits. History of falls. Memory and ability to understand (cognition). Work and work Statistician. Reproductive health. Screening  You may have the following tests or measurements: Height, weight, and BMI. Blood pressure. Lipid and cholesterol levels. These may be checked every 5 years, or more frequently if you are over 62 years old. Skin check. Lung cancer screening. You may have this screening every year starting at age 67 if you have a 30-pack-year history of smoking and currently smoke or have quit within the past 15 years. Fecal occult blood test (FOBT) of the stool. You may have this test every year starting at age 1. Flexible sigmoidoscopy or colonoscopy. You may have a sigmoidoscopy every 5 years or a colonoscopy every 10 years starting at age 39. Hepatitis C blood test. Hepatitis B blood test. Sexually transmitted disease (STD) testing. Diabetes screening. This is done by checking your blood sugar (glucose) after you have not eaten for a while (fasting). You may have this done every 1-3 years. Bone density scan. This is done to screen for osteoporosis. You may have this done starting at age 73. Mammogram. This may be done every 1-2 years. Talk to your health care provider about how often you should have regular mammograms. Talk with your health care provider about your test results, treatment options, and if necessary, the need for more tests. Vaccines  Your health care provider may recommend certain vaccines, such as: Influenza vaccine. This  is recommended every year. Tetanus, diphtheria, and acellular pertussis (Tdap, Td) vaccine. You may need a Td  booster every 10 years. Zoster vaccine. You may need this after age 50. Pneumococcal 13-valent conjugate (PCV13) vaccine. One dose is recommended after age 47. Pneumococcal polysaccharide (PPSV23) vaccine. One dose is recommended after age 65. Talk to your health care provider about which screenings and vaccines you need and how often you need them. This information is not intended to replace advice given to you by your health care provider. Make sure you discuss any questions you have with your health care provider. Document Released: 12/24/2015 Document Revised: 08/16/2016 Document Reviewed: 09/28/2015 Elsevier Interactive Patient Education  2017 [place] Prevention in the Home Falls can cause injuries. They can happen to people of all ages. There are many things you can do to make your home safe and to help prevent falls. What can I do on the outside of my home? Regularly fix the edges of walkways and driveways and fix any cracks. Remove anything that might make you trip as you walk through a door, such as a raised step or threshold. Trim any bushes or trees on the path to your home. Use bright outdoor lighting. Clear any walking paths of anything that might make someone trip, such as rocks or tools. Regularly check to see if handrails are loose or broken. Make sure that both sides of any steps have handrails. Any raised decks and porches should have guardrails on the edges. Have any leaves, snow, or ice cleared regularly. Use sand or salt on walking paths during winter. Clean up any spills in your garage right away. This includes oil or grease spills. What can I do in the bathroom? Use night lights. Install grab bars by the toilet and in the tub and shower. Do not use towel bars as grab bars. Use non-skid mats or decals in the tub or shower. If you need to sit down in the shower, use a plastic, non-slip stool. Keep the floor dry. Clean up any water that spills on the floor  as soon as it happens. Remove soap buildup in the tub or shower regularly. Attach bath mats securely with double-sided non-slip rug tape. Do not have throw rugs and other things on the floor that can make you trip. What can I do in the bedroom? Use night lights. Make sure that you have a light by your bed that is easy to reach. Do not use any sheets or blankets that are too big for your bed. They should not hang down onto the floor. Have a firm chair that has side arms. You can use this for support while you get dressed. Do not have throw rugs and other things on the floor that can make you trip. What can I do in the kitchen? Clean up any spills right away. Avoid walking on wet floors. Keep items that you use a lot in easy-to-reach places. If you need to reach something above you, use a strong step stool that has a grab bar. Keep electrical cords out of the way. Do not use floor polish or wax that makes floors slippery. If you must use wax, use non-skid floor wax. Do not have throw rugs and other things on the floor that can make you trip. What can I do with my stairs? Do not leave any items on the stairs. Make sure that there are handrails on both sides of the stairs and use them. Fix handrails that  are broken or loose. Make sure that handrails are as long as the stairways. Check any carpeting to make sure that it is firmly attached to the stairs. Fix any carpet that is loose or worn. Avoid having throw rugs at the top or bottom of the stairs. If you do have throw rugs, attach them to the floor with carpet tape. Make sure that you have a light switch at the top of the stairs and the bottom of the stairs. If you do not have them, ask someone to add them for you. What else can I do to help prevent falls? Wear shoes that: Do not have high heels. Have rubber bottoms. Are comfortable and fit you well. Are closed at the toe. Do not wear sandals. If you use a stepladder: Make sure that it is  fully opened. Do not climb a closed stepladder. Make sure that both sides of the stepladder are locked into place. Ask someone to hold it for you, if possible. Clearly mark and make sure that you can see: Any grab bars or handrails. First and last steps. Where the edge of each step is. Use tools that help you move around (mobility aids) if they are needed. These include: Canes. Walkers. Scooters. Crutches. Turn on the lights when you go into a dark area. Replace any light bulbs as soon as they burn out. Set up your furniture so you have a clear path. Avoid moving your furniture around. If any of your floors are uneven, fix them. If there are any pets around you, be aware of where they are. Review your medicines with your doctor. Some medicines can make you feel dizzy. This can increase your chance of falling. Ask your doctor what other things that you can do to help prevent falls. This information is not intended to replace advice given to you by your health care provider. Make sure you discuss any questions you have with your health care provider. Document Released: 09/23/2009 Document Revised: 05/04/2016 Document Reviewed: 01/01/2015 Elsevier Interactive Patient Education  2017 [employer].

## 2022-07-05 NOTE — Progress Notes (Addendum)
Virtual Visit via Telephone Note  I connected with  Ann [name] on 07/05/22 at  9:45 AM EDT by telephone and verified that I am speaking with the correct person using two identifiers.  Medicare Annual Wellness visit completed telephonically due to Covid-19 pandemic.   Persons participating in this call: This Health Coach and this patient.   Location: Patient: home Provider: office   I discussed the limitations, risks, security and privacy concerns of performing an evaluation and management service by telephone and the availability of in person appointments. The patient expressed understanding and agreed to proceed.  Unable to perform video visit due to video visit attempted and failed and/or patient does not have video capability.   Some vital signs may be absent or patient reported.   Willette Brace, LPN   Subjective:   Ann [name] is a 71 y.o. female who presents for Medicare Annual (Subsequent) preventive examination.  Review of Systems     Cardiac Risk Factors include: advanced age (>76mn, >>63women);dyslipidemia;hypertension;diabetes mellitus;obesity (BMI >30kg/m2)     Objective:    There were no vitals filed for this visit. There is no height or weight on file to calculate BMI.     07/05/2022    9:56 AM 06/22/2021    9:55 AM 01/24/2016   12:30 PM 01/24/2016    7:34 AM 01/18/2016   12:48 PM  Advanced Directives  Does Patient Have a Medical Advance Directive? Yes Yes Yes  Yes  Type of [place] Healthcare Power of [place] Does patient want to make changes to medical advance directive?   No - Patient declined  No - Patient declined  Copy of [place]? No - copy requested No - copy requested Yes  No - copy requested    Current Medications (verified) Outpatient Encounter Medications as of  07/05/2022  Medication Sig   ALPRAZolam (XANAX) 0.25 MG tablet Take 1 tablet (0.25 mg total) by mouth daily as needed for anxiety.   Continuous Blood Gluc Receiver ([place] DEVI See admin instructions.   Continuous Blood Gluc Sensor (DEXCOM G6 SENSOR) MISC 1 DEVICE BY DOES NOT APPLY ROUTE SEE ADMIN INSTRUCTIONS. CHANGE EVERY 10 DAYS   Continuous Blood Gluc Transmit (DEXCOM G6 TRANSMITTER) MISC Change every 3 mos   dabigatran (PRADAXA) 150 MG CAPS capsule Take 1 capsule (150 mg total) by mouth 2 (two) times daily.   EPIPEN 2-PAK 0.3 MG/0.3ML SOAJ injection Inject 0.3 mg into the skin as needed. Bee stings   Insulin Glargine (BASAGLAR KWIKPEN) 100 UNIT/ML Inject 50 Units into the skin every morning.   Insulin Pen Needle (NOVOFINE PEN NEEDLE) 32G X 6 MM MISC Inject 1 Device into the skin in the morning, at noon, in the evening, and at bedtime. Use as instructed to inject insulin   Lancets (ONETOUCH DELICA PLUS LUKGURK27C MISC Apply 1 each topically 3 (three) times daily.   levothyroxine (SYNTHROID) 50 MCG tablet Take 1 tablet (50 mcg total) by mouth daily before breakfast.   metoprolol tartrate (LOPRESSOR) 50 MG tablet Take 1 tablet (50 mg total) by mouth 2 (two) times daily.   NOVOLOG FLEXPEN 100 UNIT/ML FlexPen Inject into the skin.   olmesartan-hydrochlorothiazide (BENICAR HCT) 40-25 MG tablet Take 1 tablet by mouth daily.   ONETOUCH ULTRA test strip SMARTSIG:Via Meter   triamcinolone ointment (KENALOG) 0.5 % Apply topically 2 (two)  times daily as needed.   zolpidem (AMBIEN) 5 MG tablet Take 1 tablet (5 mg total) by mouth at bedtime as needed.   insulin lispro (HUMALOG KWIKPEN) 200 UNIT/ML KwikPen Max daily 200 units (Patient not taking: Reported on 07/05/2022)   No facility-administered encounter medications on file as of 07/05/2022.    Allergies (verified) Amoxicillin, Codeine, Ezetimibe, Livalo [pitavastatin], Statins, and Welchol [colesevelam]   History: Past Medical History:   Diagnosis Date   Arthritis    osteoarthritis- knees.   Atrial fibrillation ([place])    Dr. Marcello Moores status post cardioversion chronic anticoagulation   Cellulitis of right lower extremity 12/30/2020   Chicken pox    Colon cancer ([place]) 2001   Radiation, chemo.  Adenocarcinoma of the colon.  Followed by Dr. Derrel Nip   Complication of anesthesia    sensitive to narcotics"extra sleepy". Versed- "no memory recall for 4 days postop"   Diabetes mellitus without complication (HCC)    oral and Insulin   Hepatitis    "sub clinical case of Hepatitis in late 70's""has immunities"   Hypertension    Hypothyroidism    Laceration of head 08/04/2019   8 cm laceration forehead/scalp after tripping when mowing her lawn.   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   Ovarian teratoma, right 09/2000   Oophorectomy   Sleep apnea    unable to tolreate cpap   Wisdom teeth extracted    Past Surgical History:  Procedure Laterality Date   ABDOMINAL HYSTERECTOMY  09/2000    Total hysterectomy and oophorectomy   ABDOMINAL WOUND DEHISCENCE Bilateral    multiple times / mesh use with multiple abdominal hernia repair   CARDIOVERSION  2014   Unsuccessful.  Patient reports cardiac ablation was not felt to be appropriate next step by cardiology.   CHOLECYSTECTOMY  04/1998   '98 -[place]  2001   "colon cancer"   FOOT SURGERY Bilateral    multiple "hammer toes and bunionectomies"   KNEE ARTHROSCOPY Left 08/2006   meniscectomy   MELANOMA EXCISION     near scapula   TOTAL KNEE ARTHROPLASTY Right 01/24/2016   Procedure: RIGHT TOTAL KNEE ARTHROPLASTY;  Surgeon: Gaynelle Arabian, MD;  Location: [facility];  Service: Orthopedics;  Laterality: Right;   Family History  Problem Relation Age of Onset   Arthritis Mother    Colon cancer Mother    Diabetes Mother    Atrial fibrillation Mother     Diabetes Father    Heart attack Father    Heart disease Father    Stroke Father    Hypertension Maternal Grandmother    Stroke Maternal Grandmother    Stroke Maternal Grandfather    Hypertension Paternal Grandmother    Stroke Paternal Grandmother    Diabetes Paternal Grandfather    Hypertension Paternal Grandfather    Stroke Paternal Grandfather    Diabetes Brother    Brain cancer Brother    Heart disease Brother    Hypertension Brother    Stroke Brother    Social History   Socioeconomic History   Marital status: Single    Spouse name: Not on file   Number of children: 0   Years of education: Not on file   Highest education level: Not on file  Occupational History   Not on file  Tobacco Use   Smoking status: Former  Years: 25.00    Types: Cigarettes    Quit date: 01/17/1997    Years since quitting: 25.4   Smokeless tobacco: Never  Vaping Use   Vaping Use: Never used  Substance and Sexual Activity   Alcohol use: No   Drug use: No   Sexual activity: Not Currently  Other Topics Concern   Not on file  Social History Narrative   Marital status/children/pets: Single. G0P0.   Education/employment: Retired Oceanographer:      -smoke alarm in the home:Yes     - wears seatbelt: Yes     - Feels safe in their relationships: Yes   Social Determinants of Health   Financial Resource Strain: Low Risk  (07/05/2022)   Overall Financial Resource Strain (CARDIA)    Difficulty of Paying Living Expenses: Not hard at all  Food Insecurity: No Food Insecurity (07/05/2022)   Hunger Vital Sign    Worried About Running Out of Food in the Last Year: Never true    Ran Out of Food in the Last Year: Never true  Transportation Needs: No Transportation Needs (07/05/2022)   PRAPARE - Hydrologist (Medical): No    Lack of Transportation (Non-Medical): No  Physical Activity: Inactive (07/05/2022)   Exercise Vital Sign    Days of  Exercise per Week: 0 days    Minutes of Exercise per Session: 0 min  Stress: No Stress Concern Present (07/05/2022)   [place]    Feeling of Stress : Not at all  Social Connections: Moderately Integrated (07/05/2022)   Social Connection and Isolation Panel [NHANES]    Frequency of Communication with Friends and Family: More than three times a week    Frequency of Social Gatherings with Friends and Family: More than three times a week    Attends Religious Services: More than 4 times per year    Active Member of [employer] or Organizations: Yes    Attends Archivist Meetings: 1 to 4 times per year    Marital Status: Divorced    Tobacco Counseling Counseling given: Not Answered   Clinical Intake:  Pre-visit preparation completed: Yes  Pain : No/denies pain     BMI - recorded: 44.1 Nutritional Status: BMI > 30  Obese Nutritional Risks: None Diabetes: Yes CBG done?: No CBG resulted in Enter/ Edit results?: No Did pt. bring in CBG monitor from home?: No  How often do you need to have someone help you when you read instructions, pamphlets, or other written materials from your doctor or pharmacy?: 1 - Never  Diabetic?Nutrition Risk Assessment:  Has the patient had any N/V/D within the last 2 months?  No  Does the patient have any non-healing wounds?  No  Has the patient had any unintentional weight loss or weight gain?  No   Diabetes:  Is the patient diabetic?  Yes  If diabetic, was a CBG obtained today?  Yes  Did the patient bring in their glucometer from home?  No  How often do you monitor your CBG's? dexacom.   Financial Strains and Diabetes Management:  Are you having any financial strains with the device, your supplies or your medication? No .  Does the patient want to be seen by Chronic Care Management for management of their diabetes?  No  Would the patient like to be referred to a Nutritionist  or for Diabetic Management?  No   Diabetic  Exams:  Diabetic Eye Exam: Completed 05/01/22 Diabetic Foot Exam: Completed 09/07/21   Interpreter Needed?: No  Information entered by :: Charlott Rakes, LPN   Activities of Daily Living    07/05/2022    9:57 AM  In your present state of health, do you have any difficulty performing the following activities:  Hearing? 0  Vision? 0  Difficulty concentrating or making decisions? 0  Walking or climbing stairs? 0  Comment avoid chairs  Dressing or bathing? 0  Doing errands, shopping? 0  Preparing Food and eating ? N  Using the Toilet? N  In the past six months, have you accidently leaked urine? Y  Comment at times  Do you have problems with loss of bowel control? N  Managing your Medications? N  Managing your Finances? N  Housekeeping or managing your Housekeeping? N    Patient Care Team: Ma Hillock, DO as PCP - General (Family Medicine) Adrian Prows, MD as Consulting Physician (Cardiology) Druscilla Brownie, MD as Referring Physician (Dermatology) Otelia Sergeant, OD as Referring Physician Luci Bank, MD as Referring Physician (Orthotics) Sonda Primes, MD as Referring Physician (Obstetrics and Gynecology) Rosana Berger, MD as Referring Physician (Gastroenterology) Renato Shin, MD (Inactive) as Consulting Physician (Endocrinology)  Indicate any recent Medical Services you may have received from other than [facility] providers in the past year (date may be approximate).     Assessment:   This is a routine wellness examination for Ann [name].  Hearing/Vision screen Hearing Screening - Comments:: Pt denies any hearing issues  Vision Screening - Comments:: Pt follows up with Dr Blair Heys for annual eye exams   Dietary issues and exercise activities discussed: Current Exercise Habits: The patient does not participate in regular exercise at present   Goals Addressed             This Visit's Progress    Patient Stated        Lose weight       Depression Screen    07/05/2022    9:55 AM 02/14/2022   10:59 AM 09/12/2021   11:08 AM 06/22/2021    9:58 AM 03/18/2021   11:08 AM 07/23/2020   11:17 AM 04/20/2020   10:13 AM  PHQ 2/9 Scores  PHQ - 2 Score 0 0 0 0 0 0 0  PHQ- 9 Score  3 5        Fall Risk    07/05/2022    9:57 AM 09/12/2021   10:53 AM 06/22/2021    9:56 AM 03/18/2021   11:08 AM 07/23/2020   11:16 AM  Fall Risk   Falls in the past year? 0 0 0 0 1  Number falls in past yr: 0 0 0 0 0  Injury with Fall? 0 0 0 0 1  Risk for fall due to : Impaired vision;Impaired mobility;Impaired balance/gait      Follow up Falls prevention discussed Falls evaluation completed Falls evaluation completed;Falls prevention discussed      FALL RISK PREVENTION PERTAINING TO THE HOME:  Any stairs in or around the home? Yes  If so, are there any without handrails? No  Home free of loose throw rugs in walkways, pet beds, electrical cords, etc? Yes  Adequate lighting in your home to reduce risk of falls? Yes   ASSISTIVE DEVICES UTILIZED TO PREVENT FALLS:  Life alert? No  Use of a cane, walker or w/c? Yes  Grab bars in the bathroom? Yes  Shower chair or bench in shower?  Yes  Elevated toilet seat or a handicapped toilet? Yes   TIMED UP AND GO:  Was the test performed? No .   Cognitive Function:        07/05/2022    9:59 AM  6CIT Screen  What Year? 0 points  What month? 0 points    Immunizations Immunization History  Administered Date(s) Administered   Fluad Quad(high Dose 65+) 09/26/2021   Influenza-Unspecified 09/26/2019   Moderna SARS-COV2 Booster Vaccination 09/02/2021   Moderna Sars-Covid-2 Vaccination 01/22/2020, 02/20/2020, 10/20/2020   Pneumococcal Conjugate-13 10/12/2014   Pneumococcal Polysaccharide-23 10/21/2019   Tdap 08/04/2019    TDAP status: Up to date  Flu Vaccine status: Up to date  Pneumococcal vaccine status: Up to date  Covid-19 vaccine status: Completed vaccines  Qualifies  for Shingles Vaccine? Yes   Zostavax completed No   Shingrix Completed?: No.    Education has been provided regarding the importance of this vaccine. Patient has been advised to call insurance company to determine out of pocket expense if they have not yet received this vaccine. Advised may also receive vaccine at local pharmacy or Health Dept. Verbalized acceptance and understanding.  Screening Tests Health Maintenance  Topic Date Due   COVID-19 Vaccine (4 - Moderna series) 10/28/2021   MAMMOGRAM  05/25/2022   INFLUENZA VACCINE  07/11/2022   FOOT EXAM  09/07/2022   HEMOGLOBIN A1C  10/22/2022   OPHTHALMOLOGY EXAM  05/02/2023   COLONOSCOPY (Pts 45-2yr Insurance coverage will need to be confirmed)  09/07/2023   TETANUS/TDAP  08/03/2029   Pneumonia Vaccine 71 Years old  Completed   DEXA SCAN  Completed   HPV VACCINES  Aged Out   Hepatitis C Screening  Discontinued   Zoster Vaccines- Shingrix  Discontinued    Health Maintenance  Health Maintenance Due  Topic Date Due   COVID-19 Vaccine (4 - Moderna series) 10/28/2021   MAMMOGRAM  05/25/2022    Colorectal cancer screening: Type of screening: Colonoscopy. Completed 09/06/14. Repeat every 5 years    bone density completed 03/30/17   Additional Screening:  Hepatitis C Screening: does not qualify;  Vision Screening: Recommended annual ophthalmology exams for early detection of glaucoma and other disorders of the eye. Is the patient up to date with their annual eye exam?  Yes  Who is the provider or what is the name of the office in which the patient attends annual eye exams? Dr BOswaldo Conroy If pt is not established with a provider, would they like to be referred to a provider to establish care? No .   Dental Screening: Recommended annual dental exams for proper oral hygiene  Community Resource Referral / Chronic Care Management: CRR required this visit?  No   CCM required this visit?  No      Plan:     I have personally  reviewed and noted the following in the patient's chart:   Medical and social history Use of alcohol, tobacco or illicit drugs  Current medications and supplements including opioid prescriptions.  Functional ability and status Nutritional status Physical activity Advanced directives List of other physicians Hospitalizations, surgeries, and ER visits in previous 12 months Vitals Screenings to include cognitive, depression, and falls Referrals and appointments  In addition, I have reviewed and discussed with patient certain preventive protocols, quality metrics, and best practice recommendations. A written personalized care plan for preventive services as well as general preventive health recommendations were provided to patient.     TWillette Brace LPN   [id number]  Nurse Notes: None

## 2022-08-01 DIAGNOSIS — B351 Tinea unguium: Secondary | ICD-10-CM | POA: Diagnosis not present

## 2022-08-01 DIAGNOSIS — Z794 Long term (current) use of insulin: Secondary | ICD-10-CM | POA: Diagnosis not present

## 2022-08-01 DIAGNOSIS — E119 Type 2 diabetes mellitus without complications: Secondary | ICD-10-CM | POA: Diagnosis not present

## 2022-08-01 DIAGNOSIS — M79672 Pain in left foot: Secondary | ICD-10-CM | POA: Diagnosis not present

## 2022-08-01 DIAGNOSIS — M79671 Pain in right foot: Secondary | ICD-10-CM | POA: Diagnosis not present

## 2022-08-02 ENCOUNTER — Ambulatory Visit (INDEPENDENT_AMBULATORY_CARE_PROVIDER_SITE_OTHER): Payer: Medicare Other | Admitting: Family Medicine

## 2022-08-02 ENCOUNTER — Encounter: Payer: Self-pay | Admitting: Family Medicine

## 2022-08-02 VITALS — BP 106/67 | HR 78 | Temp 97.9°F | Ht 68.0 in | Wt 293.0 lb

## 2022-08-02 DIAGNOSIS — M1711 Unilateral primary osteoarthritis, right knee: Secondary | ICD-10-CM

## 2022-08-02 DIAGNOSIS — E034 Atrophy of thyroid (acquired): Secondary | ICD-10-CM | POA: Diagnosis not present

## 2022-08-02 DIAGNOSIS — F419 Anxiety disorder, unspecified: Secondary | ICD-10-CM

## 2022-08-02 DIAGNOSIS — Z1231 Encounter for screening mammogram for malignant neoplasm of breast: Secondary | ICD-10-CM

## 2022-08-02 DIAGNOSIS — E1169 Type 2 diabetes mellitus with other specified complication: Secondary | ICD-10-CM | POA: Diagnosis not present

## 2022-08-02 DIAGNOSIS — G479 Sleep disorder, unspecified: Secondary | ICD-10-CM

## 2022-08-02 DIAGNOSIS — I4821 Permanent atrial fibrillation: Secondary | ICD-10-CM

## 2022-08-02 DIAGNOSIS — I10 Essential (primary) hypertension: Secondary | ICD-10-CM | POA: Diagnosis not present

## 2022-08-02 DIAGNOSIS — R2681 Unsteadiness on feet: Secondary | ICD-10-CM | POA: Diagnosis not present

## 2022-08-02 DIAGNOSIS — E785 Hyperlipidemia, unspecified: Secondary | ICD-10-CM

## 2022-08-02 MED ORDER — ALPRAZOLAM 0.25 MG PO TABS: 0.2500 mg | ORAL_TABLET | Freq: Every day | ORAL | 5 refills | Status: DC | PRN

## 2022-08-02 MED ORDER — ZOLPIDEM TARTRATE 5 MG PO TABS: 5.0000 mg | ORAL_TABLET | Freq: Every evening | ORAL | 5 refills | Status: DC | PRN

## 2022-08-02 NOTE — Patient Instructions (Addendum)
Return in about 24 weeks (around 01/17/2023) for Routine chronic condition follow-up.        Great to see you today.  I have refilled the medication(s) we provide.   If labs were collected, we will inform you of lab results once received either by echart message or telephone call.   - echart message- for normal results that have been seen by the patient already.   - telephone call: abnormal results or if patient has not viewed results in their echart.

## 2022-08-02 NOTE — Progress Notes (Signed)
Patient ID: Ann [name], female  DOB: 1951/09/09, 71 y.o.   MRN: [id number] Patient Care Team    Relationship Specialty Notifications Start End  Ma Hillock, DO PCP - General Family Medicine  04/20/20   Adrian Prows, MD Consulting Physician Cardiology  04/20/20   Druscilla Brownie, MD Referring Physician Dermatology  04/20/20   Otelia Sergeant, OD Referring Physician   04/20/20   Luci Bank, MD Referring Physician Orthotics  04/20/20   Sonda Primes, MD Referring Physician Obstetrics and Gynecology  04/20/20   Rosana Berger, MD Referring Physician Gastroenterology  04/20/20   Renato Shin, MD (Inactive) Consulting Physician Endocrinology  04/27/21     Chief Complaint  Patient presents with   Hypertension    [facility]; pt is fasting    Subjective: Ann [name] is a 71 y.o.  female present for [facility] HTN/HLD/morbid obesity/atrial fibrillation: Pt reports compliance with Toprol 50 mg twice daily and olmesartan-HCTZ 40-25 mg daily. Blood pressures ranges at home than normal limits. Patient denies chest pain, shortness of breath, dizziness or lower extremity edema.  Pt declined statin. RF: Hypertension, hyperlipidemia, obesity, diabetes, fib, family history of heart disease  Sleep disturbance/anxiety She states with the Ambien she is able to get better sleep.  She will take Xanax at times, she uses this when needed only  for increased anxiety or unable to fall asleep.  He denies oversedation. Prior note: Patient presents today to discuss her Xanax use.  She uses her Xanax very infrequently.  She states she only uses Xanax when she is feeling like she is having increased anxiety or panic, which is usually preceded by her feeling that her heart rate is increasing.  She states the Xanax will resolve this feeling quite quickly.  She had a prescription that was written last May for 15 tabs of Xanax 0.25 mg and 1 refill.  She was not aware she had the refill, but is down to only 2 Xanax left and  she would like refills today.  She did not seem to realize the law and [place] in which she needs to be seen every 6 months to renew prescriptions that are controlled substances, and that they will automatically expire after 6 months whether there are refills left on the prescription or not.  Patient did sign a contract last year, we will update this today.  UDS was also collected last year. She states she still is not sleeping well without the Ambien.  She had been on Ambien 10 mg nightly in the past and then was having difficulties receiving Ambien prescriptions through her prior PCP.  We discussed today that Ambien 5 mg is the maximum recommended dose for age.  She should be able to receive this medication.  She states that she thought Medicare would not pay for this medication in people over 65. Prior note: Patient reports she had been on Ambien up until last year.  She states her prior PCP would not refill her Ambien for her.  She states she still does not sleep well, but she will manage since she cannot have the Ambien.  By records it appears she was tried on Klonopin within the last few months.  She did not feel she needed type of medication for sleep, but needed a hypnotic.  She reports she has had a prescription for Xanax that dates back to around 2018.  She uses this when she has increased anxiety or heart racing.  She reports she is  used this about 9 times in 3 years.  She would like a refill on Xanax.  She states her prior PCP would not refill Xanax or Ambien, therefore she left the practice.     07/05/2022    9:55 AM 02/14/2022   10:59 AM 09/12/2021   11:08 AM 06/22/2021    9:58 AM 03/18/2021   11:08 AM  Depression screen PHQ 2/9  Decreased Interest 0 0 0 0 0  Down, Depressed, Hopeless 0 0 0 0 0  PHQ - 2 Score 0 0 0 0 0  Altered sleeping  3 3    Tired, decreased energy  0 0    Change in appetite  0 2    Feeling bad or failure about yourself   0 0    Trouble concentrating  0 0     Moving slowly or fidgety/restless  0 0    Suicidal thoughts  0 0    PHQ-9 Score  3 5        02/14/2022   11:01 AM 09/12/2021   11:08 AM  GAD 7 : Generalized Anxiety Score  Nervous, Anxious, on Edge 0 1  Control/stop worrying 0 0  Worry too much - different things 0 0  Trouble relaxing 0 0  Restless 0 0  Easily annoyed or irritable 0 0  Afraid - awful might happen 0 0  Total GAD 7 Score 0 1          07/05/2022    9:57 AM 09/12/2021   10:53 AM 06/22/2021    9:56 AM 03/18/2021   11:08 AM 07/23/2020   11:16 AM  Fall Risk   Falls in the past year? 0 0 0 0 1  Number falls in past yr: 0 0 0 0 0  Injury with Fall? 0 0 0 0 1  Risk for fall due to : Impaired vision;Impaired mobility;Impaired balance/gait      Follow up Falls prevention discussed Falls evaluation completed Falls evaluation completed;Falls prevention discussed      Immunization History  Administered Date(s) Administered   Fluad Quad(high Dose 65+) 09/26/2021   Influenza-Unspecified 09/26/2019   Moderna SARS-COV2 Booster Vaccination 09/02/2021   Moderna Sars-Covid-2 Vaccination 01/22/2020, 02/20/2020, 10/20/2020   Pneumococcal Conjugate-13 10/12/2014   Pneumococcal Polysaccharide-23 10/21/2019   Tdap 08/04/2019    No results found.  Past Medical History:  Diagnosis Date   Arthritis    osteoarthritis- knees.   Atrial fibrillation ([place])    Dr. Marcello Moores status post cardioversion chronic anticoagulation   Cellulitis of right lower extremity 12/30/2020   Chicken pox    Colon cancer ([place]) 2001   Radiation, chemo.  Adenocarcinoma of the colon.  Followed by Dr. Derrel Nip   Complication of anesthesia    sensitive to narcotics"extra sleepy". Versed- "no memory recall for 4 days postop"   Diabetes mellitus without complication (HCC)    oral and Insulin   Hepatitis    "sub clinical case of Hepatitis in late 70's""has immunities"   Hypertension    Hypothyroidism    Laceration of head 08/04/2019   8 cm laceration  forehead/scalp after tripping when mowing her lawn.   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   Ovarian teratoma, right 09/2000   Oophorectomy   Sleep apnea    unable to tolreate cpap   Wisdom teeth extracted    Allergies  Allergen Reactions   Amoxicillin Shortness Of Breath, Swelling and Anaphylaxis    Has patient had a PCN reaction causing immediate rash, facial/tongue/throat swelling, SOB or lightheadedness with hypotension: Yes Has patient had a PCN reaction causing severe rash involving mucus membranes or skin necrosis: No Has patient had a PCN reaction that required hospitalization No Has patient had a PCN reaction occurring within the last 10 years: Yes If all of the above answers are "NO", then may proceed with Cephalosporin use.  Has patient had a PCN reaction causing immediate rash, facial/tongue/throat swelling, SOB or lightheadedness with hypotension: Yes Has patient had a PCN reaction causing severe rash involving mucus membranes or skin necrosis: No Has patient had a PCN reaction that required hospitalization No Has patient had a PCN reaction occurring within the last 10 years: Yes If all of the above answers are "NO", then may proceed with Cephalosporin use. Has patient had a PCN reaction causing immediate rash, facial/tongue/throat swelling, SOB or lightheadedness with hypotension: Yes Has patient had a PCN reaction causing severe rash involving mucus membranes or skin necrosis: No Has patient had a PCN reaction that required hospitalization No Has patient had a PCN reaction occurring within the last 10 years: Yes If all of the above answers are "NO", then may proceed with Cephalosporin use.    Codeine Itching    Tolerates hydrocodone   Ezetimibe    Livalo [Pitavastatin]    Statins Other (See Comments)    Muscle skeletal side effects   Welchol [Colesevelam]      Gi se   Past Surgical History:  Procedure Laterality Date   ABDOMINAL HYSTERECTOMY  09/2000    Total hysterectomy and oophorectomy   ABDOMINAL WOUND DEHISCENCE Bilateral    multiple times / mesh use with multiple abdominal hernia repair   CARDIOVERSION  2014   Unsuccessful.  Patient reports cardiac ablation was not felt to be appropriate next step by cardiology.   CHOLECYSTECTOMY  04/1998   '98 -[place]  2001   "colon cancer"   FOOT SURGERY Bilateral    multiple "hammer toes and bunionectomies"   KNEE ARTHROSCOPY Left 08/2006   meniscectomy   MELANOMA EXCISION     near scapula   TOTAL KNEE ARTHROPLASTY Right 01/24/2016   Procedure: RIGHT TOTAL KNEE ARTHROPLASTY;  Surgeon: Gaynelle Arabian, MD;  Location: [facility];  Service: Orthopedics;  Laterality: Right;   Family History  Problem Relation Age of Onset   Arthritis Mother    Colon cancer Mother    Diabetes Mother    Atrial fibrillation Mother    Diabetes Father    Heart attack Father    Heart disease Father    Stroke Father    Hypertension Maternal Grandmother    Stroke Maternal Grandmother    Stroke Maternal Grandfather    Hypertension Paternal Grandmother    Stroke Paternal Grandmother    Diabetes Paternal Grandfather    Hypertension Paternal Grandfather    Stroke Paternal Grandfather    Diabetes Brother    Brain cancer Brother    Heart disease Brother    Hypertension Brother    Stroke Brother    Social History   Social History Narrative   Marital status/children/pets: Single. G0P0.   Education/employment: Retired Oceanographer:      -smoke alarm in the home:Yes     - wears seatbelt: Yes     - Feels safe in their relationships: Yes    Allergies as  of 08/02/2022       Reactions   Amoxicillin Shortness Of Breath, Swelling, Anaphylaxis   Has patient had a PCN reaction causing immediate rash, facial/tongue/throat swelling, SOB or lightheadedness with  hypotension: Yes Has patient had a PCN reaction causing severe rash involving mucus membranes or skin necrosis: No Has patient had a PCN reaction that required hospitalization No Has patient had a PCN reaction occurring within the last 10 years: Yes If all of the above answers are "NO", then may proceed with Cephalosporin use. Has patient had a PCN reaction causing immediate rash, facial/tongue/throat swelling, SOB or lightheadedness with hypotension: Yes Has patient had a PCN reaction causing severe rash involving mucus membranes or skin necrosis: No Has patient had a PCN reaction that required hospitalization No Has patient had a PCN reaction occurring within the last 10 years: Yes If all of the above answers are "NO", then may proceed with Cephalosporin use. Has patient had a PCN reaction causing immediate rash, facial/tongue/throat swelling, SOB or lightheadedness with hypotension: Yes Has patient had a PCN reaction causing severe rash involving mucus membranes or skin necrosis: No Has patient had a PCN reaction that required hospitalization No Has patient had a PCN reaction occurring within the last 10 years: Yes If all of the above answers are "NO", then may proceed with Cephalosporin use.   Codeine Itching   Tolerates hydrocodone   Ezetimibe    Livalo [pitavastatin]    Statins Other (See Comments)   Muscle skeletal side effects   Welchol [colesevelam]    Gi se        Medication List        Accurate as of August 02, 2022 11:59 PM. If you have any questions, ask your nurse or doctor.          ALPRAZolam 0.25 MG tablet Commonly known as: XANAX Take 1 tablet (0.25 mg total) by mouth daily as needed for anxiety.   Basaglar KwikPen 100 UNIT/ML Inject 50 Units into the skin every morning.   dabigatran 150 MG Caps capsule Commonly known as: PRADAXA Take 1 capsule (150 mg total) by mouth 2 (two) times daily.   Dexcom G6 Receiver Kerrin Mo See admin instructions.   Dexcom  G6 Sensor Misc 1 DEVICE BY DOES NOT APPLY ROUTE SEE ADMIN INSTRUCTIONS. CHANGE EVERY 10 DAYS   Dexcom G6 Transmitter Misc Change every 3 mos   EpiPen 2-Pak 0.3 mg/0.3 mL Soaj injection Generic drug: EPINEPHrine Inject 0.3 mg into the skin as needed. Bee stings   HumaLOG KwikPen 200 UNIT/ML KwikPen Generic drug: insulin lispro Max daily 200 units   levothyroxine 50 MCG tablet Commonly known as: SYNTHROID Take 1 tablet (50 mcg total) by mouth daily before breakfast.   metoprolol tartrate 50 MG tablet Commonly known as: LOPRESSOR Take 1 tablet (50 mg total) by mouth 2 (two) times daily.   Novofine Pen Needle 32G X 6 MM Misc Generic drug: Insulin Pen Needle Inject 1 Device into the skin in the morning, at noon, in the evening, and at bedtime. Use as instructed to inject insulin   NovoLOG FlexPen 100 UNIT/ML FlexPen Generic drug: insulin aspart Inject into the skin.   olmesartan-hydrochlorothiazide 40-25 MG tablet Commonly known as: BENICAR HCT Take 1 tablet by mouth daily.   OneTouch Delica Plus [id number] Misc Apply 1 each topically 3 (three) times daily.   OneTouch Ultra test strip Generic drug: glucose blood SMARTSIG:Via Meter   triamcinolone ointment 0.5 % Commonly known as: KENALOG Apply topically 2 (  two) times daily as needed.   zolpidem 5 MG tablet Commonly known as: AMBIEN Take 1 tablet (5 mg total) by mouth at bedtime as needed.               Durable Medical Equipment  (From admission, onward)           Start     Ordered   08/16/22 0000  For home use only DME 4 wheeled rolling walker with seat       Question:  Patient needs a walker to treat with the following condition  Answer:  Arthritis of both knees   08/16/22 1717            All past medical history, surgical history, allergies, family history, immunizations andmedications were updated in the EMR today and reviewed under the history and medication portions of their EMR.     ROS: 14  pt review of systems performed and negative (unless mentioned in an HPI)  Objective: BP 106/67   Pulse 78   Temp 97.9 F (36.6 C) (Oral)   Ht '5\' 8"'$  (1.727 m)   Wt 293 lb (132.9 kg)   SpO2 95%   BMI 44.55 kg/m  Physical Exam Vitals and nursing note reviewed.  Constitutional:      General: She is not in acute distress.    Appearance: Normal appearance. She is obese. She is not ill-appearing, toxic-appearing or diaphoretic.  HENT:     Head: Normocephalic and atraumatic.  Eyes:     General: No scleral icterus.       Right eye: No discharge.        Left eye: No discharge.     Extraocular Movements: Extraocular movements intact.     Conjunctiva/sclera: Conjunctivae normal.     Pupils: Pupils are equal, round, and reactive to light.  Cardiovascular:     Rate and Rhythm: Normal rate and regular rhythm.     Heart sounds: No murmur heard.    No gallop.  Pulmonary:     Effort: Pulmonary effort is normal. No respiratory distress.     Breath sounds: Normal breath sounds. No wheezing, rhonchi or rales.  Musculoskeletal:        General: Tenderness present.     Cervical back: Neck supple. No tenderness.     Right lower leg: No edema.     Left lower leg: No edema.  Lymphadenopathy:     Cervical: No cervical adenopathy.  Skin:    General: Skin is warm and dry.     Coloration: Skin is not jaundiced or pale.     Findings: No erythema or rash.  Neurological:     Mental Status: She is alert and oriented to person, place, and time. Mental status is at baseline.     Motor: Weakness present.     Gait: Gait abnormal.  Psychiatric:        Mood and Affect: Mood normal.        Behavior: Behavior normal.        Thought Content: Thought content normal.        Judgment: Judgment normal.    Diabetic Foot Exam - Simple   Simple Foot Form Diabetic Foot exam was performed with the following findings: Yes 08/02/2022  1:05 PM  Visual Inspection No deformities, no ulcerations, no other skin  breakdown bilaterally: Yes Sensation Testing Intact to touch and monofilament testing bilaterally: Yes Pulse Check Posterior Tibialis and Dorsalis pulse intact bilaterally: Yes Comments  Assessment/plan: QUANTASIA STEGNER is a 71 y.o. female present for [facility] Anxiety/sleep disturbance Stable Continue Xanax 0.25 mg daily daily as needed.  She rarely uses this medicine. She uses medications appropriately and understands risks of drug class. Continue Ambien at the 5 mg dose for her.  She understands 5 mg is the maximum recommended dose for those in her age bracket.  If for some reason her Medicare plan will not cover this we can consider GoodRx prescription since generic Ambien is rather inexpensive. Patient understands State regulations on controlled substances and [facility] policy on prescribing controlled substances.  Contract UTD [place] controlled substance database reviewed 08/16/22 Patient aware a face-to-face follow-up will be needed if she would need refills after 6 months.  Patient aware she will need to request these scripts they will not be automatically refilled since they are controlled substances.  Hypertension/morbid obesity/A-fib:  Stable Continue Pradaxa twice daily> cardiology prescribes Continue metoprolol 50 mg twice daily Continue valsartan-HCTZ 40-25 daily Heart healthy diet recommended Statin declined She is established with cardio.  Labs up-to-date Follow-up 5.5 months.  DM w/ hyperlipidemia:  Diabetes managed by endocrine. Cholesterol has been elevated.  Heart healthy diet and routine exercise encouraged.  Medication management has been declined by patient for her lipids.  Diabetic eye exam up-to-date 05/01/2022 Foot exam completed today Pneumonia vac up-to-date Encourage influenza vaccine before October 1  Arthritis, knee/gait instability: Patient has more pain in her knee arthritis that is causing her gait to be altered.   Her gait is  unsteady, this puts her at increased risk for falls.  Return in about 24 weeks (around 01/17/2023) for Routine chronic condition follow-up.  Orders Placed This Encounter  Procedures   For home use only DME 4 wheeled rolling walker with seat   Microalbumin / creatinine urine ratio    Meds ordered this encounter  Medications   ALPRAZolam (XANAX) 0.25 MG tablet    Sig: Take 1 tablet (0.25 mg total) by mouth daily as needed for anxiety.    Dispense:  30 tablet    Refill:  5   metoprolol tartrate (LOPRESSOR) 50 MG tablet    Sig: Take 1 tablet (50 mg total) by mouth 2 (two) times daily.    Dispense:  180 tablet    Refill:  1   olmesartan-hydrochlorothiazide (BENICAR HCT) 40-25 MG tablet    Sig: Take 1 tablet by mouth daily.    Dispense:  90 tablet    Refill:  1   zolpidem (AMBIEN) 5 MG tablet    Sig: Take 1 tablet (5 mg total) by mouth at bedtime as needed.    Dispense:  30 tablet    Refill:  5    Referral Orders  No referral(s) requested today     Note is dictated utilizing voice recognition software. Although note has been proof read prior to signing, occasional typographical errors still can be missed. If any questions arise, please do not hesitate to call for verification.  Electronically signed by: Howard Pouch, DO [place]

## 2022-08-03 MED ORDER — METOPROLOL TARTRATE 50 MG PO TABS: 50.0000 mg | ORAL_TABLET | Freq: Two times a day (BID) | ORAL | 1 refills | Status: DC

## 2022-08-03 MED ORDER — OLMESARTAN MEDOXOMIL-HCTZ 40-25 MG PO TABS: 1.0000 | ORAL_TABLET | Freq: Every day | ORAL | 1 refills | Status: DC

## 2022-08-16 DIAGNOSIS — N763 Subacute and chronic vulvitis: Secondary | ICD-10-CM | POA: Diagnosis not present

## 2022-08-16 DIAGNOSIS — L280 Lichen simplex chronicus: Secondary | ICD-10-CM | POA: Diagnosis not present

## 2022-08-16 DIAGNOSIS — Z01411 Encounter for gynecological examination (general) (routine) with abnormal findings: Secondary | ICD-10-CM | POA: Diagnosis not present

## 2022-08-18 LAB — MICROALBUMIN / CREATININE URINE RATIO
Creatinine,U: 165.9 mg/dL
Microalb Creat Ratio: 0.9 mg/g (ref 0.0–30.0)
Microalb, Ur: 1.5 mg/dL (ref 0.0–1.9)

## 2022-08-25 ENCOUNTER — Encounter: Payer: Self-pay | Admitting: Internal Medicine

## 2022-08-25 ENCOUNTER — Ambulatory Visit (INDEPENDENT_AMBULATORY_CARE_PROVIDER_SITE_OTHER): Payer: Medicare Other | Admitting: Internal Medicine

## 2022-08-25 ENCOUNTER — Other Ambulatory Visit: Payer: Self-pay | Admitting: Internal Medicine

## 2022-08-25 VITALS — BP 134/86 | HR 84 | Ht 68.0 in | Wt 293.6 lb

## 2022-08-25 DIAGNOSIS — E785 Hyperlipidemia, unspecified: Secondary | ICD-10-CM

## 2022-08-25 DIAGNOSIS — Z794 Long term (current) use of insulin: Secondary | ICD-10-CM | POA: Diagnosis not present

## 2022-08-25 DIAGNOSIS — E1165 Type 2 diabetes mellitus with hyperglycemia: Secondary | ICD-10-CM | POA: Insufficient documentation

## 2022-08-25 DIAGNOSIS — E1169 Type 2 diabetes mellitus with other specified complication: Secondary | ICD-10-CM

## 2022-08-25 LAB — POCT GLYCOSYLATED HEMOGLOBIN (HGB A1C): Hemoglobin A1C: 8.2 % — AB (ref 4.0–5.6)

## 2022-08-25 MED ORDER — DEXCOM G7 SENSOR MISC: 1.0000 | 3 refills | Status: DC

## 2022-08-25 MED ORDER — DEXCOM G7 RECEIVER DEVI: 1.0000 | 0 refills | Status: DC

## 2022-08-25 MED ORDER — NOVOLOG FLEXPEN 100 UNIT/ML ~~LOC~~ SOPN
PEN_INJECTOR | SUBCUTANEOUS | 3 refills | Status: DC
Start: 2022-08-25 — End: 2022-08-28

## 2022-08-25 MED ORDER — BASAGLAR KWIKPEN 100 UNIT/ML ~~LOC~~ SOPN: 46.0000 [IU] | PEN_INJECTOR | SUBCUTANEOUS | 3 refills | Status: DC

## 2022-08-25 NOTE — Patient Instructions (Addendum)
-   Decrease Basaglar to 46 units daily  - Novolog Take 10 units if sugar less than 150   Take 30 units if sugar between 150-250  Take 40 units if sugar is between 250-300  Take 60 units if over  300    HOW TO TREAT LOW BLOOD SUGARS (Blood sugar LESS THAN 70 MG/DL) Please follow the RULE OF 15 for the treatment of hypoglycemia treatment (when your (blood sugars are less than 70 mg/dL)   STEP 1: Take 15 grams of carbohydrates when your blood sugar is low, which includes:  3-4 GLUCOSE TABS  OR 3-4 OZ OF JUICE OR REGULAR SODA OR ONE TUBE OF GLUCOSE GEL    STEP 2: RECHECK blood sugar in 15 MINUTES STEP 3: If your blood sugar is still low at the 15 minute recheck --> then, go back to STEP 1 and treat AGAIN with another 15 grams of carbohydrates.

## 2022-08-25 NOTE — Progress Notes (Signed)
Name: Ann [name]  Age/ Sex: 71 y.o., female   MRN/ DOB: [id number], 28-Dec-1950     PCP: Ma Hillock, DO   Reason for Endocrinology Evaluation: Type 2 Diabetes Mellitus  Initial Endocrine Consultative Visit: 09/22/2020    PATIENT IDENTIFIER: Ann [name] is a 71 y.o. female with a past medical history of T2Dm, A. Fib ,HTn and dyslipidemia, Has hx of colon cancer  . The patient has followed with Endocrinology clinic since 2021 for consultative assistance with management of her diabetes.  DIABETIC HISTORY:  Ann [name] was diagnosed with DM in 2013. Intolerant to Metformin.Farxiga and invokana caused genital irritation.Trulicity -caused urinary retention . Her hemoglobin A1c has ranged from 7.6% in 2022, peaking at 8.8% in 2022.    She had a normal thyroid ultrasound in May 2023, which was prompted by thyromegaly on physical exam.  She was followed up by Dr. Loanne Drilling from 2021 until March 2023 SUBJECTIVE:   During the last visit (04/21/2022): Marland Kitchen A1c 8.6 %   Today (08/25/2022): Ann [name] is here for a follow up on diabetes management .She checks her blood sugars multiple times a day through CGM. The patient has had hypoglycemic episodes since the last clinic visit, she is symptomatic with these episodes   She has been having cough and sinus issues, has been using mucinex DM  Its a dry cough , no fever     HOME DIABETES REGIMEN:  Basaglar 50 units daily  Novolog per scale  CF: Humalog (BG-130/15)     Statin: yes ACE-I/ARB: yes    CONTINUOUS GLUCOSE MONITORING RECORD INTERPRETATION    Dates of Recording:8/31-9/13/2023  Sensor description:dexcom  Results statistics:   CGM use % of time 93  Average and SD 49/42  Time in range    46 %  % Time Above 180 46  % Time above 250 4  % Time Below target <1      Glycemic patterns summary: hyperglycemia during the day and night. BG's at goal between 3-6 am   Hyperglycemic episodes  post  lunch  Hypoglycemic episodes occurred n/a  Overnight periods: high      DIABETIC COMPLICATIONS: Microvascular complications:  Denies:  Last Eye Exam: Completed   Macrovascular complications:   Denies: CAD, CVA, PVD   HISTORY:  Past Medical History:  Past Medical History:  Diagnosis Date   Arthritis    osteoarthritis- knees.   Atrial fibrillation ([place])    Dr. Marcello Moores status post cardioversion chronic anticoagulation   Cellulitis of right lower extremity 12/30/2020   Chicken pox    Colon cancer ([place]) 2001   Radiation, chemo.  Adenocarcinoma of the colon.  Followed by Dr. Derrel Nip   Complication of anesthesia    sensitive to narcotics"extra sleepy". Versed- "no memory recall for 4 days postop"   Diabetes mellitus without complication ([facility])    oral and Insulin   Hepatitis    "sub clinical case of Hepatitis in late 70's""has immunities"   Hypertension    Hypothyroidism    Laceration of head 08/04/2019   8 cm laceration forehead/scalp after tripping when mowing her lawn.   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   Ovarian teratoma, right 09/2000   Oophorectomy   Sleep apnea    unable to tolreate cpap   Wisdom teeth extracted    Past Surgical History:  Past Surgical  History:  Procedure Laterality Date   ABDOMINAL HYSTERECTOMY  09/2000    Total hysterectomy and oophorectomy   ABDOMINAL WOUND DEHISCENCE Bilateral    multiple times / mesh use with multiple abdominal hernia repair   CARDIOVERSION  2014   Unsuccessful.  Patient reports cardiac ablation was not felt to be appropriate next step by cardiology.   CHOLECYSTECTOMY  04/1998   '98 -[place]  2001   "colon cancer"   FOOT SURGERY Bilateral    multiple "hammer toes and bunionectomies"   KNEE ARTHROSCOPY Left 08/2006   meniscectomy   MELANOMA EXCISION     near scapula   TOTAL KNEE  ARTHROPLASTY Right 01/24/2016   Procedure: RIGHT TOTAL KNEE ARTHROPLASTY;  Surgeon: Gaynelle Arabian, MD;  Location: [facility];  Service: Orthopedics;  Laterality: Right;   Social History:  reports that she quit smoking about 25 years ago. Her smoking use included cigarettes. She has never used smokeless tobacco. She reports that she does not drink alcohol and does not use drugs. Family History:  Family History  Problem Relation Age of Onset   Arthritis Mother    Colon cancer Mother    Diabetes Mother    Atrial fibrillation Mother    Diabetes Father    Heart attack Father    Heart disease Father    Stroke Father    Hypertension Maternal Grandmother    Stroke Maternal Grandmother    Stroke Maternal Grandfather    Hypertension Paternal Grandmother    Stroke Paternal Grandmother    Diabetes Paternal Grandfather    Hypertension Paternal Grandfather    Stroke Paternal Grandfather    Diabetes Brother    Brain cancer Brother    Heart disease Brother    Hypertension Brother    Stroke Brother      HOME MEDICATIONS: Allergies as of 08/25/2022       Reactions   Amoxicillin Shortness Of Breath, Swelling, Anaphylaxis   Has patient had a PCN reaction causing immediate rash, facial/tongue/throat swelling, SOB or lightheadedness with hypotension: Yes Has patient had a PCN reaction causing severe rash involving mucus membranes or skin necrosis: No Has patient had a PCN reaction that required hospitalization No Has patient had a PCN reaction occurring within the last 10 years: Yes If all of the above answers are "NO", then may proceed with Cephalosporin use. Has patient had a PCN reaction causing immediate rash, facial/tongue/throat swelling, SOB or lightheadedness with hypotension: Yes Has patient had a PCN reaction causing severe rash involving mucus membranes or skin necrosis: No Has patient had a PCN reaction that required hospitalization No Has patient had a PCN reaction occurring within the  last 10 years: Yes If all of the above answers are "NO", then may proceed with Cephalosporin use. Has patient had a PCN reaction causing immediate rash, facial/tongue/throat swelling, SOB or lightheadedness with hypotension: Yes Has patient had a PCN reaction causing severe rash involving mucus membranes or skin necrosis: No Has patient had a PCN reaction that required hospitalization No Has patient had a PCN reaction occurring within the last 10 years: Yes If all of the above answers are "NO", then may proceed with Cephalosporin use.   Codeine Itching   Tolerates hydrocodone   Ezetimibe    Livalo [pitavastatin]    Statins Other (See Comments)   Muscle skeletal side effects   Welchol [colesevelam]    Gi se        Medication List  Accurate as of August 25, 2022  3:04 PM. If you have any questions, ask your nurse or doctor.          STOP taking these medications    Dexcom G6 Transmitter Misc Stopped by: Dorita Sciara, MD   HumaLOG KwikPen 200 UNIT/ML KwikPen Generic drug: insulin lispro Stopped by: Dorita Sciara, MD       TAKE these medications    ALPRAZolam 0.25 MG tablet Commonly known as: XANAX Take 1 tablet (0.25 mg total) by mouth daily as needed for anxiety.   Basaglar KwikPen 100 UNIT/ML Inject 50 Units into the skin every morning.   dabigatran 150 MG Caps capsule Commonly known as: PRADAXA Take 1 capsule (150 mg total) by mouth 2 (two) times daily.   Dexcom G7 Receiver Devi 1 Device by Does not apply route as directed. What changed:  how much to take how to take this when to take this Changed by: Dorita Sciara, MD   Dexcom G7 Sensor Misc 1 Device by Does not apply route as directed. What changed:  how much to take how to take this when to take this additional instructions Changed by: Dorita Sciara, MD   EpiPen 2-Pak 0.3 mg/0.3 mL Soaj injection Generic drug: EPINEPHrine Inject 0.3 mg into the skin as  needed. Bee stings   levothyroxine 50 MCG tablet Commonly known as: SYNTHROID Take 1 tablet (50 mcg total) by mouth daily before breakfast.   metoprolol tartrate 50 MG tablet Commonly known as: LOPRESSOR Take 1 tablet (50 mg total) by mouth 2 (two) times daily.   Novofine Pen Needle 32G X 6 MM Misc Generic drug: Insulin Pen Needle Inject 1 Device into the skin in the morning, at noon, in the evening, and at bedtime. Use as instructed to inject insulin   NovoLOG FlexPen 100 UNIT/ML FlexPen Generic drug: insulin aspart Inject into the skin.   olmesartan-hydrochlorothiazide 40-25 MG tablet Commonly known as: BENICAR HCT Take 1 tablet by mouth daily.   OneTouch Delica Plus [id number] Misc Apply 1 each topically 3 (three) times daily.   OneTouch Ultra test strip Generic drug: glucose blood SMARTSIG:Via Meter   triamcinolone ointment 0.5 % Commonly known as: KENALOG Apply topically 2 (two) times daily as needed.   zolpidem 5 MG tablet Commonly known as: AMBIEN Take 1 tablet (5 mg total) by mouth at bedtime as needed.         OBJECTIVE:   Vital Signs: BP 134/86 (BP Location: Left Arm, Patient Position: Sitting, Cuff Size: Large)   Pulse 84   Ht '5\' 8"'$  (1.727 m)   Wt 293 lb 9.6 oz (133.2 kg)   SpO2 95%   BMI 44.64 kg/m   Wt Readings from Last 3 Encounters:  08/25/22 293 lb 9.6 oz (133.2 kg)  08/02/22 293 lb (132.9 kg)  04/21/22 290 lb (131.5 kg)     Exam: General: Pt appears well and is in NAD  Right thyroid nodule palpated   Lungs: Clear with good BS bilat with no rales, rhonchi, or wheezes  Heart: RRR   Extremities: No pretibial edema.   Neuro: MS is good with appropriate affect, pt is alert and Ox3   DM Foot Exam 9/15/203 The skin of the feet is intact without sores or ulcerations. The pedal pulses are 2+ on right and 2+ on left. The sensation is intact to a screening 5.07, 10 gram monofilament bilaterally        DATA REVIEWED:  Lab Results  Component Value Date   HGBA1C 8.2 (A) 08/25/2022   HGBA1C 8.6 (A) 04/21/2022   HGBA1C 8.1 (A) 02/17/2022   Lab Results  Component Value Date   MICROALBUR 1.5 08/16/2022   LDLCALC 160 04/02/2019   CREATININE 1.08 02/14/2022   Lab Results  Component Value Date   MICRALBCREAT 0.9 08/16/2022     Lab Results  Component Value Date   CHOL 252 (H) 02/14/2022   HDL 46.80 02/14/2022   LDLCALC 160 04/02/2019   LDLDIRECT 149.0 02/14/2022   TRIG 398.0 (H) 02/14/2022   CHOLHDL 5 02/14/2022        Latest Reference Range & Units 02/14/22 11:27  Sodium 135 - 145 mEq/L 138  Potassium 3.5 - 5.1 mEq/L 4.1  Chloride 96 - 112 mEq/L 99  CO2 19 - 32 mEq/L 27  Glucose 70 - 99 mg/dL 212 (H)  BUN 6 - 23 mg/dL 31 (H)  Creatinine 0.40 - 1.20 mg/dL 1.08  Calcium 8.4 - 10.5 mg/dL 9.3  Alkaline Phosphatase 39 - 117 U/L 80  Albumin 3.5 - 5.2 g/dL 4.1  AST 0 - 37 U/L 21  ALT 0 - 35 U/L 19  Total Protein 6.0 - 8.3 g/dL 6.7  Total Bilirubin 0.2 - 1.2 mg/dL 1.0  GFR >60.00 mL/min 51.29 (L)    Latest Reference Range & Units 02/14/22 11:27  Total CHOL/HDL Ratio  5  Cholesterol 0 - 200 mg/dL 252 (H)  HDL Cholesterol >39.00 mg/dL 46.80  Direct LDL mg/dL 149.0  NonHDL  204.77  Triglycerides 0.0 - 149.0 mg/dL 398.0 (H)  VLDL 0.0 - 40.0 mg/dL 79.6 (H)    ASSESSMENT / PLAN / RECOMMENDATIONS:   1) Type 2 Diabetes Mellitus,Sub-optimally  controlled, without complications - Most recent A1c of 8.2  %. Goal A1c < 7.0 %.      -I have reviewed her CGM download today which shows fluctuating hyperglycemia and normal BG's over the past 2 weeks.  The patient does endorse hypoglycemia overnight, but there is none over the past 2 weeks. -We initially attempted to prescribe Humalog U200, but the patient has drastically reduced NovoLog intake due to recurrent hypoglycemia -Patient with fear of hypoglycemia -I will reduce her basal insulin due to self-reported hypoglycemia at night -I am not going to change her  NovoLog scale at this time -Patient declines any GLP-1 agonist due to risk of thyroid cancer -Intolerant to metformin and SGLT2 inhibitors -A prescription for Dexcom G7 and receiver was sent to [facility] per her request  MEDICATIONS: Decrease Basaglar to 46 units daily NovoLog  -10 units < 150 30 units, 150-250 40 units, 250-300 60 units, >300  EDUCATION / INSTRUCTIONS: BG monitoring instructions: Patient is instructed to check her blood sugars 3 times a day, before meals . Call [facility] if: BG persistently < 70  I reviewed the Rule of 15 for the treatment of hypoglycemia in detail with the patient. Literature supplied.   2)Thyromegaly :  -Patient has been noted with a right anterior neck nodule, thyroid ultrasound was unrevealing -We will continue to monitor clinically   F/U in 4 months     Signed electronically by: Mack Guise, MD  [facility]  [address] [place], [place] [address] Phone: [phone] FAX: [phone]   CC: Ma Hillock, DO [address] [place] [address] Phone: [phone]  Fax: [phone]  Return to Endocrinology clinic as below: Future Appointments  Date Time Provider [place]  01/15/2023  1:00 PM Phyllis Whitefield, Melanie Crazier, MD [facility] None  01/31/2023 11:00 AM Adrian Prows, MD [facility] None  07/11/2023  9:00 AM [facility] [place]

## 2022-08-27 ENCOUNTER — Other Ambulatory Visit: Payer: Self-pay | Admitting: Internal Medicine

## 2022-08-28 ENCOUNTER — Telehealth: Payer: Self-pay

## 2022-08-28 ENCOUNTER — Encounter: Payer: Self-pay | Admitting: Internal Medicine

## 2022-08-28 ENCOUNTER — Other Ambulatory Visit: Payer: Self-pay

## 2022-08-28 ENCOUNTER — Other Ambulatory Visit (HOSPITAL_COMMUNITY): Payer: Self-pay

## 2022-08-28 MED ORDER — DEXCOM G7 SENSOR MISC: 3 refills | Status: DC

## 2022-08-28 NOTE — Telephone Encounter (Signed)
[facility] wants to verify max daily dose and sliding scale directions for Novolog.

## 2022-08-29 ENCOUNTER — Other Ambulatory Visit: Payer: Self-pay | Admitting: Internal Medicine

## 2022-08-29 MED ORDER — NOVOLOG FLEXPEN 100 UNIT/ML ~~LOC~~ SOPN: PEN_INJECTOR | SUBCUTANEOUS | 3 refills | Status: DC

## 2022-08-29 NOTE — Telephone Encounter (Signed)
New script has been sent

## 2022-08-31 DIAGNOSIS — Z1231 Encounter for screening mammogram for malignant neoplasm of breast: Secondary | ICD-10-CM | POA: Diagnosis not present

## 2022-08-31 LAB — HM MAMMOGRAPHY

## 2022-09-05 ENCOUNTER — Telehealth: Payer: Self-pay

## 2022-09-05 NOTE — Telephone Encounter (Signed)
Please call patient and inform her her mammogram is normal.

## 2022-09-05 NOTE — Telephone Encounter (Signed)
Received Mammogram results from [facility] on 09/04/22. Will place on PCP desk for review.

## 2022-09-06 NOTE — Telephone Encounter (Signed)
Spoke with pt regarding labs and instructions.   

## 2022-09-16 DIAGNOSIS — Z23 Encounter for immunization: Secondary | ICD-10-CM | POA: Diagnosis not present

## 2022-10-02 DIAGNOSIS — Z23 Encounter for immunization: Secondary | ICD-10-CM | POA: Diagnosis not present

## 2022-10-19 ENCOUNTER — Other Ambulatory Visit: Payer: Self-pay

## 2022-10-19 DIAGNOSIS — E1169 Type 2 diabetes mellitus with other specified complication: Secondary | ICD-10-CM

## 2022-10-19 MED ORDER — NOVOLOG FLEXPEN 100 UNIT/ML ~~LOC~~ SOPN
PEN_INJECTOR | SUBCUTANEOUS | 3 refills | Status: DC
Start: 2022-10-19 — End: 2022-10-23

## 2022-10-23 MED ORDER — NOVOLOG FLEXPEN 100 UNIT/ML ~~LOC~~ SOPN: PEN_INJECTOR | SUBCUTANEOUS | 3 refills | Status: DC

## 2022-10-23 NOTE — Addendum Note (Signed)
Addended by: Lauralyn Primes on: 10/23/2022 05:11 PM   Modules accepted: Orders

## 2022-11-06 DIAGNOSIS — Z760 Encounter for issue of repeat prescription: Secondary | ICD-10-CM | POA: Diagnosis not present

## 2022-11-06 DIAGNOSIS — M79675 Pain in left toe(s): Secondary | ICD-10-CM | POA: Diagnosis not present

## 2022-11-06 DIAGNOSIS — M1A0710 Idiopathic chronic gout, right ankle and foot, without tophus (tophi): Secondary | ICD-10-CM | POA: Diagnosis not present

## 2022-11-06 DIAGNOSIS — M1A0720 Idiopathic chronic gout, left ankle and foot, without tophus (tophi): Secondary | ICD-10-CM | POA: Diagnosis not present

## 2022-11-06 DIAGNOSIS — M79674 Pain in right toe(s): Secondary | ICD-10-CM | POA: Diagnosis not present

## 2022-11-06 DIAGNOSIS — M1A9XX0 Chronic gout, unspecified, without tophus (tophi): Secondary | ICD-10-CM | POA: Diagnosis not present

## 2022-11-10 ENCOUNTER — Ambulatory Visit: Payer: Medicare Other | Admitting: Family Medicine

## 2022-11-14 ENCOUNTER — Telehealth: Payer: Self-pay

## 2022-11-14 ENCOUNTER — Ambulatory Visit (INDEPENDENT_AMBULATORY_CARE_PROVIDER_SITE_OTHER): Payer: Medicare Other | Admitting: Family Medicine

## 2022-11-14 ENCOUNTER — Encounter: Payer: Self-pay | Admitting: Family Medicine

## 2022-11-14 VITALS — BP 100/74 | HR 83 | Temp 98.2°F | Wt 297.6 lb

## 2022-11-14 DIAGNOSIS — E034 Atrophy of thyroid (acquired): Secondary | ICD-10-CM | POA: Diagnosis not present

## 2022-11-14 DIAGNOSIS — I10 Essential (primary) hypertension: Secondary | ICD-10-CM | POA: Diagnosis not present

## 2022-11-14 DIAGNOSIS — F419 Anxiety disorder, unspecified: Secondary | ICD-10-CM

## 2022-11-14 DIAGNOSIS — G479 Sleep disorder, unspecified: Secondary | ICD-10-CM | POA: Diagnosis not present

## 2022-11-14 DIAGNOSIS — I4821 Permanent atrial fibrillation: Secondary | ICD-10-CM | POA: Diagnosis not present

## 2022-11-14 DIAGNOSIS — M109 Gout, unspecified: Secondary | ICD-10-CM | POA: Diagnosis not present

## 2022-11-14 MED ORDER — METOPROLOL TARTRATE 50 MG PO TABS: 50.0000 mg | ORAL_TABLET | Freq: Two times a day (BID) | ORAL | 1 refills | Status: DC

## 2022-11-14 MED ORDER — OLMESARTAN MEDOXOMIL-HCTZ 40-25 MG PO TABS: 1.0000 | ORAL_TABLET | Freq: Every day | ORAL | 1 refills | Status: DC

## 2022-11-14 MED ORDER — ZOLPIDEM TARTRATE 5 MG PO TABS: 5.0000 mg | ORAL_TABLET | Freq: Every evening | ORAL | 5 refills | Status: DC | PRN

## 2022-11-14 MED ORDER — COLCHICINE 0.6 MG PO TABS
ORAL_TABLET | ORAL | 5 refills | Status: DC
Start: 2022-11-14 — End: 2023-09-18

## 2022-11-14 MED ORDER — ALPRAZOLAM 0.25 MG PO TABS: 0.2500 mg | ORAL_TABLET | Freq: Every day | ORAL | 5 refills | Status: DC | PRN

## 2022-11-14 NOTE — Progress Notes (Signed)
Patient ID: Ann [name], female  DOB: 1950-12-28, 71 y.o.   MRN: [id number] Patient Care Team    Relationship Specialty Notifications Start End  Ma Hillock, DO PCP - General Family Medicine  04/20/20   Adrian Prows, MD Consulting Physician Cardiology  04/20/20   Druscilla Brownie, MD Referring Physician Dermatology  04/20/20   Otelia Sergeant, OD Referring Physician   04/20/20   Luci Bank, MD Referring Physician Orthotics  04/20/20   Sonda Primes, MD Referring Physician Obstetrics and Gynecology  04/20/20   Rosana Berger, MD Referring Physician Gastroenterology  04/20/20   Renato Shin, MD (Inactive) Consulting Physician Endocrinology  04/27/21     Chief Complaint  Patient presents with   Medication Refill    Needs refill on allpurinol    Subjective: Ann [name] is a 71 y.o.  female present for [facility] HTN/HLD/morbid obesity/atrial fibrillation: Pt reports compliance with metoprolol 50 mg twice daily and olmesartan-HCTZ 40-25 mg daily. Blood pressures ranges at home than normal limits. Patient denies chest pain, shortness of breath, dizziness or lower extremity edema.  Pt declined statin. RF: Hypertension, hyperlipidemia, obesity, diabetes, fib, family history of heart disease  Gout: Presents today with concerns of not having uric acid tested here last visit, for her large toe discomfort that was being managed by her podiatry team.  At the time of our last visit patient reported her podiatrist thought that she had gout/tophi of her left large toe and recommended she had a uric acid tested.  He had ordered a uric acid at [facility] [place] for her.  I advised her at that time that management should be completed through the treating team at the time, since we recovering multiple chronic conditions for her during her scheduled appointment and she was not present for the new gout workup. Today she states that she is upset that she did not have the uric acid completed here.  She was on a  social media form and there was a post about "doctors that refuse to perform tests, "in which patient's ask for be completed. Of note, patient would rather not take allopurinol daily to prevent her gout flares which occur 1-2 times a year.  She was seen at urgent care recently and had a uric acid level of 8.3 and started on allopurinol during a flare for treatment.  She would like to have her uric acid levels tested again and have her gout treatment completed here.  Sleep disturbance/anxiety She states with the Ambien she is able to get better sleep.  She will take Xanax at times, she uses this when needed only  for increased anxiety or unable to fall asleep.  He denies oversedation. Prior note: Patient presents today to discuss her Xanax use.  She uses her Xanax very infrequently.  She states she only uses Xanax when she is feeling like she is having increased anxiety or panic, which is usually preceded by her feeling that her heart rate is increasing.  She states the Xanax will resolve this feeling quite quickly.  She had a prescription that was written last May for 15 tabs of Xanax 0.25 mg and 1 refill.  She was not aware she had the refill, but is down to only 2 Xanax left and she would like refills today.  She did not seem to realize the law and [place] in which she needs to be seen every 6 months to renew prescriptions that are controlled substances, and that they will automatically  expire after 6 months whether there are refills left on the prescription or not.  Patient did sign a contract last year, we will update this today.  UDS was also collected last year. She states she still is not sleeping well without the Ambien.  She had been on Ambien 10 mg nightly in the past and then was having difficulties receiving Ambien prescriptions through her prior PCP.  We discussed today that Ambien 5 mg is the maximum recommended dose for age.  She should be able to receive this medication.  She states  that she thought Medicare would not pay for this medication in people over 65. Prior note: Patient reports she had been on Ambien up until last year.  She states her prior PCP would not refill her Ambien for her.  She states she still does not sleep well, but she will manage since she cannot have the Ambien.  By records it appears she was tried on Klonopin within the last few months.  She did not feel she needed type of medication for sleep, but needed a hypnotic.  She reports she has had a prescription for Xanax that dates back to around 2018.  She uses this when she has increased anxiety or heart racing.  She reports she is used this about 9 times in 3 years.  She would like a refill on Xanax.  She states her prior PCP would not refill Xanax or Ambien, therefore she left the practice.     07/05/2022    9:55 AM 02/14/2022   10:59 AM 09/12/2021   11:08 AM 06/22/2021    9:58 AM 03/18/2021   11:08 AM  Depression screen PHQ 2/9  Decreased Interest 0 0 0 0 0  Down, Depressed, Hopeless 0 0 0 0 0  PHQ - 2 Score 0 0 0 0 0  Altered sleeping  3 3    Tired, decreased energy  0 0    Change in appetite  0 2    Feeling bad or failure about yourself   0 0    Trouble concentrating  0 0    Moving slowly or fidgety/restless  0 0    Suicidal thoughts  0 0    PHQ-9 Score  3 5        02/14/2022   11:01 AM 09/12/2021   11:08 AM  GAD 7 : Generalized Anxiety Score  Nervous, Anxious, on Edge 0 1  Control/stop worrying 0 0  Worry too much - different things 0 0  Trouble relaxing 0 0  Restless 0 0  Easily annoyed or irritable 0 0  Afraid - awful might happen 0 0  Total GAD 7 Score 0 1          07/05/2022    9:57 AM 09/12/2021   10:53 AM 06/22/2021    9:56 AM 03/18/2021   11:08 AM 07/23/2020   11:16 AM  Fall Risk   Falls in the past year? 0 0 0 0 1  Number falls in past yr: 0 0 0 0 0  Injury with Fall? 0 0 0 0 1  Risk for fall due to : Impaired vision;Impaired mobility;Impaired balance/gait      Follow up  Falls prevention discussed Falls evaluation completed Falls evaluation completed;Falls prevention discussed      Immunization History  Administered Date(s) Administered   COVID-19, mRNA, vaccine(Comirnaty)12 years and older 10/02/2022   Fluad Quad(high Dose 65+) 09/26/2021, 09/16/2022   Influenza-Unspecified 09/26/2019   Moderna SARS-COV2  Booster Vaccination 09/02/2021   Moderna Sars-Covid-2 Vaccination 01/22/2020, 02/20/2020, 10/20/2020   Pneumococcal Conjugate-13 10/12/2014   Pneumococcal Polysaccharide-23 10/21/2019   Tdap 08/04/2019    No results found.  Past Medical History:  Diagnosis Date   Arthritis    osteoarthritis- knees.   Atrial fibrillation ([place])    Dr. Marcello Moores status post cardioversion chronic anticoagulation   Cellulitis of right lower extremity 12/30/2020   Chicken pox    Colon cancer ([place]) 2001   Radiation, chemo.  Adenocarcinoma of the colon.  Followed by Dr. Derrel Nip   Complication of anesthesia    sensitive to narcotics"extra sleepy". Versed- "no memory recall for 4 days postop"   Diabetes mellitus without complication (HCC)    oral and Insulin   Hepatitis    "sub clinical case of Hepatitis in late 70's""has immunities"   Hypertension    Hypothyroidism    Laceration of head 08/04/2019   8 cm laceration forehead/scalp after tripping when mowing her lawn.   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   Ovarian teratoma, right 09/2000   Oophorectomy   Sleep apnea    unable to tolreate cpap   Wisdom teeth extracted    Allergies  Allergen Reactions   Amoxicillin Shortness Of Breath, Swelling and Anaphylaxis    Has patient had a PCN reaction causing immediate rash, facial/tongue/throat swelling, SOB or lightheadedness with hypotension: Yes Has patient had a PCN reaction causing severe rash involving mucus membranes or skin necrosis: No Has patient had a  PCN reaction that required hospitalization No Has patient had a PCN reaction occurring within the last 10 years: Yes If all of the above answers are "NO", then may proceed with Cephalosporin use.  Has patient had a PCN reaction causing immediate rash, facial/tongue/throat swelling, SOB or lightheadedness with hypotension: Yes Has patient had a PCN reaction causing severe rash involving mucus membranes or skin necrosis: No Has patient had a PCN reaction that required hospitalization No Has patient had a PCN reaction occurring within the last 10 years: Yes If all of the above answers are "NO", then may proceed with Cephalosporin use. Has patient had a PCN reaction causing immediate rash, facial/tongue/throat swelling, SOB or lightheadedness with hypotension: Yes Has patient had a PCN reaction causing severe rash involving mucus membranes or skin necrosis: No Has patient had a PCN reaction that required hospitalization No Has patient had a PCN reaction occurring within the last 10 years: Yes If all of the above answers are "NO", then may proceed with Cephalosporin use.    Codeine Itching    Tolerates hydrocodone   Ezetimibe    Livalo [Pitavastatin]    Statins Other (See Comments)    Muscle skeletal side effects   Welchol [Colesevelam]     Gi se   Past Surgical History:  Procedure Laterality Date   ABDOMINAL HYSTERECTOMY  09/2000    Total hysterectomy and oophorectomy   ABDOMINAL WOUND DEHISCENCE Bilateral    multiple times / mesh use with multiple abdominal hernia repair   CARDIOVERSION  2014   Unsuccessful.  Patient reports cardiac ablation was not felt to be appropriate next step by cardiology.   CHOLECYSTECTOMY  04/1998   '98 -[place]  2001   "colon cancer"   FOOT SURGERY Bilateral    multiple "hammer toes and bunionectomies"   KNEE ARTHROSCOPY Left 08/2006  meniscectomy   MELANOMA EXCISION     near scapula   TOTAL KNEE ARTHROPLASTY Right 01/24/2016    Procedure: RIGHT TOTAL KNEE ARTHROPLASTY;  Surgeon: Gaynelle Arabian, MD;  Location: [facility];  Service: Orthopedics;  Laterality: Right;   Family History  Problem Relation Age of Onset   Arthritis Mother    Colon cancer Mother    Diabetes Mother    Atrial fibrillation Mother    Diabetes Father    Heart attack Father    Heart disease Father    Stroke Father    Hypertension Maternal Grandmother    Stroke Maternal Grandmother    Stroke Maternal Grandfather    Hypertension Paternal Grandmother    Stroke Paternal Grandmother    Diabetes Paternal Grandfather    Hypertension Paternal Grandfather    Stroke Paternal Grandfather    Diabetes Brother    Brain cancer Brother    Heart disease Brother    Hypertension Brother    Stroke Brother    Social History   Social History Narrative   Marital status/children/pets: Single. G0P0.   Education/employment: Retired Oceanographer:      -smoke alarm in the home:Yes     - wears seatbelt: Yes     - Feels safe in their relationships: Yes    Allergies as of 11/14/2022       Reactions   Amoxicillin Shortness Of Breath, Swelling, Anaphylaxis   Has patient had a PCN reaction causing immediate rash, facial/tongue/throat swelling, SOB or lightheadedness with hypotension: Yes Has patient had a PCN reaction causing severe rash involving mucus membranes or skin necrosis: No Has patient had a PCN reaction that required hospitalization No Has patient had a PCN reaction occurring within the last 10 years: Yes If all of the above answers are "NO", then may proceed with Cephalosporin use. Has patient had a PCN reaction causing immediate rash, facial/tongue/throat swelling, SOB or lightheadedness with hypotension: Yes Has patient had a PCN reaction causing severe rash involving mucus membranes or skin necrosis: No Has patient had a PCN reaction that required hospitalization No Has patient had a PCN reaction occurring within  the last 10 years: Yes If all of the above answers are "NO", then may proceed with Cephalosporin use. Has patient had a PCN reaction causing immediate rash, facial/tongue/throat swelling, SOB or lightheadedness with hypotension: Yes Has patient had a PCN reaction causing severe rash involving mucus membranes or skin necrosis: No Has patient had a PCN reaction that required hospitalization No Has patient had a PCN reaction occurring within the last 10 years: Yes If all of the above answers are "NO", then may proceed with Cephalosporin use.   Codeine Itching   Tolerates hydrocodone   Ezetimibe    Livalo [pitavastatin]    Statins Other (See Comments)   Muscle skeletal side effects   Welchol [colesevelam]    Gi se        Medication List        Accurate as of November 14, 2022 11:59 PM. If you have any questions, ask your nurse or doctor.          allopurinol 100 MG tablet Commonly known as: ZYLOPRIM Take 100 mg by mouth daily.   ALPRAZolam 0.25 MG tablet Commonly known as: XANAX Take 1 tablet (0.25 mg total) by mouth daily as needed for anxiety.   Basaglar KwikPen 100 UNIT/ML Inject 46 Units into the skin every morning.   colchicine 0.6 MG tablet 2 tabs at  onset of flare, followed by 1 tab 2 hours later. Then continue 1 tab daily until flare is resolved. Started by: Howard Pouch, DO   dabigatran 150 MG Caps capsule Commonly known as: PRADAXA Take 1 capsule (150 mg total) by mouth 2 (two) times daily.   Dexcom G7 Receiver Devi 1 Device by Does not apply route as directed.   Dexcom G7 Sensor Misc APPLY 1 SENSORS EVERY 10 DAYS  DX E11.69   EpiPen 2-Pak 0.3 mg/0.3 mL Soaj injection Generic drug: EPINEPHrine Inject 0.3 mg into the skin as needed. Bee stings   levothyroxine 50 MCG tablet Commonly known as: SYNTHROID Take 1 tablet (50 mcg total) by mouth daily before breakfast.   metoprolol tartrate 50 MG tablet Commonly known as: LOPRESSOR Take 1 tablet (50 mg  total) by mouth 2 (two) times daily.   Novofine Pen Needle 32G X 6 MM Misc Generic drug: Insulin Pen Needle Inject 1 Device into the skin in the morning, at noon, in the evening, and at bedtime. Use as instructed to inject insulin   NovoLOG FlexPen 100 UNIT/ML FlexPen Generic drug: insulin aspart MAX DAILY 60 UNITS   olmesartan-hydrochlorothiazide 40-25 MG tablet Commonly known as: BENICAR HCT Take 1 tablet by mouth daily.   OneTouch Delica Plus [id number] Misc Apply 1 each topically 3 (three) times daily.   OneTouch Ultra test strip Generic drug: glucose blood SMARTSIG:Via Meter   triamcinolone ointment 0.5 % Commonly known as: KENALOG Apply topically 2 (two) times daily as needed.   zolpidem 5 MG tablet Commonly known as: AMBIEN Take 1 tablet (5 mg total) by mouth at bedtime as needed.        All past medical history, surgical history, allergies, family history, immunizations andmedications were updated in the EMR today and reviewed under the history and medication portions of their EMR.     ROS: 14 pt review of systems performed and negative (unless mentioned in an HPI)  Objective: BP 100/74   Pulse 83   Temp 98.2 F (36.8 C)   Wt 297 lb 9.6 oz (135 kg)   SpO2 95%   BMI 45.25 kg/m  Physical Exam Vitals and nursing note reviewed.  Constitutional:      General: She is not in acute distress.    Appearance: Normal appearance. She is obese. She is not ill-appearing, toxic-appearing or diaphoretic.  HENT:     Head: Normocephalic and atraumatic.  Eyes:     General: No scleral icterus.       Right eye: No discharge.        Left eye: No discharge.     Extraocular Movements: Extraocular movements intact.     Conjunctiva/sclera: Conjunctivae normal.     Pupils: Pupils are equal, round, and reactive to light.  Cardiovascular:     Rate and Rhythm: Normal rate and regular rhythm.     Heart sounds: No murmur heard.    No gallop.  Pulmonary:     Effort: Pulmonary  effort is normal. No respiratory distress.     Breath sounds: Normal breath sounds. No wheezing, rhonchi or rales.  Musculoskeletal:        General: Tenderness present.     Cervical back: Neck supple. No tenderness.     Right lower leg: No edema.     Left lower leg: No edema.  Lymphadenopathy:     Cervical: No cervical adenopathy.  Skin:    General: Skin is warm and dry.     Coloration: Skin is  not jaundiced or pale.     Findings: No erythema or rash.  Neurological:     Mental Status: She is alert and oriented to person, place, and time. Mental status is at baseline.     Motor: No weakness.     Gait: Gait normal.  Psychiatric:        Mood and Affect: Mood normal.        Behavior: Behavior normal.        Thought Content: Thought content normal.        Judgment: Judgment normal.    Diabetic Foot Exam - Simple   No data filed      Assessment/plan: NIZHONI PARLOW is a 71 y.o. female present for [facility] Anxiety/sleep disturbance Stable  continue Xanax 0.25 mg daily daily as needed.  She rarely uses this medicine. She uses medications appropriately and understands risks of drug class. Continue Ambien at the 5 mg dose for her.  She understands 5 mg is the maximum recommended dose for those in her age bracket.  If for some reason her Medicare plan will not cover this we can consider GoodRx prescription since generic Ambien is rather inexpensive. Patient understands State regulations on controlled substances and [facility] policy on prescribing controlled substances.  Contract UTD [place] controlled substance database reviewed 11/21/22 Patient aware a face-to-face follow-up will be needed if she would need refills after 6 months.  Patient aware she will need to request these scripts they will not be automatically refilled since they are controlled substances.  Hypertension/morbid obesity/A-fib:  Stable Continue Pradaxa twice daily> cardiology prescribes Continue metoprolol  50 mg twice daily Continue valsartan-HCTZ 40-25 daily Heart healthy diet recommended Statin declined She is established with cardio.  Labs up-to-date Follow-up 5.5 months.  DM w/ hyperlipidemia:  Diabetes managed by endocrine. Cholesterol has been elevated.  Heart healthy diet and routine exercise encouraged.  Medication management has been declined by patient for her lipids.  Diabetic eye exam up-to-date 05/01/2022 Foot exam completed today Pneumonia vac up-to-date Encourage influenza vaccine before October 1  Gout- Lengthy discussion today surrounding her history of gout.  I reminded her I did not refuse to complete a test for her, I encouraged her to continue the evaluation with the podiatrist that initiated the treatment and ordered the test.  She states there was a problem with the way he wrote the order and she never could have it completed.  If it was still bothering her and she did not finish the treatment plan and evaluation with her podiatrist, then she could have made appointment with [place] to discuss her gout and we could have requested records from her podiatrist.  I reminded her, time is a factor during our appointments and we cannot always accommodate patient's desires to cover new problems, when they are scheduled for multiple chronic problems that visit.  That does not mean we will not treat or evaluate them for a new problem, it just simply means we need to do so in  a new appointment slot, so that we have the appropriate time to properly, and safely, evaluate and treat. I reminded her she has had an elevated uric acid in the past at 8.3, therefore it is known that she has had gout and that does not need to be proven by having a secondary uric acid collected.  Uric acid is monitored when attempting to use allopurinol and wanting to meet a uric acid goal of less than 6 , for prevention. Patient does  not want to take allopurinol daily.  She asks if she can get a uric acid completed  today to compare to the one that was completed at the urgent care a few days ago.  I attempted to explain there is no reason to do so, unless she wants to continue the allopurinol.  Which she again states she would rather not, and treat gout flares as they occur. We discussed allopurinol is not the treatment of acute flares and should not be used in that case.  Discussed steroid, colchicine or other NSAIDs are used for acute flares.  She would like to use colchicine for her acute flares. - Colchicine was prescribed with instructions on proper use. - Patient was provided with low purine diet on her AVS - Patient was encouraged to stop the allopurinol after completion (from urgent care's prescription). - Patient then asks if she can have her uric acid levels checked after flare. -I then agreed, if she would like to have her uric acid checked after the flare in 4 weeks she can have it completed by lab appointment only.  If uric acid is still high, enough that causes patient concern or symptoms occur more frequently, then she would have to start the allopurinol for prevention.  I would not recommend trending a uric acid level, if lab result is not being used to treat/prevent chronic gout.  It is my hope, the patient now understands the proper workup, prevention and treatment of gout.  Return in about 24 weeks (around 05/01/2023) for Routine chronic condition follow-up.  Orders Placed This Encounter  Procedures   Uric acid    Meds ordered this encounter  Medications   ALPRAZolam (XANAX) 0.25 MG tablet    Sig: Take 1 tablet (0.25 mg total) by mouth daily as needed for anxiety.    Dispense:  30 tablet    Refill:  5   metoprolol tartrate (LOPRESSOR) 50 MG tablet    Sig: Take 1 tablet (50 mg total) by mouth 2 (two) times daily.    Dispense:  180 tablet    Refill:  1   olmesartan-hydrochlorothiazide (BENICAR HCT) 40-25 MG tablet    Sig: Take 1 tablet by mouth daily.    Dispense:  90 tablet     Refill:  1   zolpidem (AMBIEN) 5 MG tablet    Sig: Take 1 tablet (5 mg total) by mouth at bedtime as needed.    Dispense:  30 tablet    Refill:  5   colchicine 0.6 MG tablet    Sig: 2 tabs at onset of flare, followed by 1 tab 2 hours later. Then continue 1 tab daily until flare is resolved.    Dispense:  30 tablet    Refill:  5    Referral Orders  No referral(s) requested today     Note is dictated utilizing voice recognition software. Although note has been proof read prior to signing, occasional typographical errors still can be missed. If any questions arise, please do not hesitate to call for verification.  Electronically signed by: Howard Pouch, DO [place]

## 2022-11-14 NOTE — Telephone Encounter (Signed)
LVM for pt to CB regarding upcoming appt.  Note from Dr. Raoul Pitch: [12:16 PM] Raoul Pitch, Joseph Art Call pam E. and find out what blood test she is referring too, so I can make sure we have the result etc.

## 2022-11-14 NOTE — Patient Instructions (Signed)
Return in about 24 weeks (around 05/01/2023) for Routine chronic condition follow-up.        Great to see you today.  I have refilled the medication(s) we provide.   If labs were collected, we will inform you of lab results once received either by echart message or telephone call.   - echart message- for normal results that have been seen by the patient already.   - telephone call: abnormal results or if patient has not viewed results in their echart.

## 2022-12-03 ENCOUNTER — Other Ambulatory Visit: Payer: Self-pay | Admitting: Family Medicine

## 2022-12-18 ENCOUNTER — Other Ambulatory Visit: Payer: Self-pay | Admitting: Internal Medicine

## 2022-12-18 ENCOUNTER — Other Ambulatory Visit: Payer: Self-pay

## 2022-12-18 MED ORDER — DEXCOM G6 TRANSMITTER MISC
3 refills | Status: DC
Start: 2022-12-18 — End: 2022-12-18

## 2023-01-15 ENCOUNTER — Ambulatory Visit (INDEPENDENT_AMBULATORY_CARE_PROVIDER_SITE_OTHER): Payer: Medicare Other | Admitting: Internal Medicine

## 2023-01-15 ENCOUNTER — Encounter: Payer: Self-pay | Admitting: Internal Medicine

## 2023-01-15 VITALS — BP 122/80 | HR 82 | Ht 68.0 in | Wt 297.0 lb

## 2023-01-15 DIAGNOSIS — Z794 Long term (current) use of insulin: Secondary | ICD-10-CM | POA: Diagnosis not present

## 2023-01-15 DIAGNOSIS — E1165 Type 2 diabetes mellitus with hyperglycemia: Secondary | ICD-10-CM

## 2023-01-15 LAB — POCT GLYCOSYLATED HEMOGLOBIN (HGB A1C): Hemoglobin A1C: 8.6 % — AB (ref 4.0–5.6)

## 2023-01-15 NOTE — Patient Instructions (Addendum)
-  Continue  Basaglar 50 units daily  - Novolog 10 units with each meal  - Novolog 5 units with a snack  - Novolog correctional insulin: ADD extra units on insulin to your meal-time Novolog dose if your blood sugars are higher than 145. Use the scale below to help guide you:   Blood sugar before meal Number of units to inject  145 0 unit  146 - 160 1 units  161 - 175 2 units  176 - 190 3 units  191 - 205 4 units  206 - 220 5 units  221 - 235 6 units  236 - 250 7 units  251 - 265 8 units  266 - 280 9 units   281- 295 10 units      HOW TO TREAT LOW BLOOD SUGARS (Blood sugar LESS THAN 70 MG/DL) Please follow the RULE OF 15 for the treatment of hypoglycemia treatment (when your (blood sugars are less than 70 mg/dL)   STEP 1: Take 15 grams of carbohydrates when your blood sugar is low, which includes:  3-4 GLUCOSE TABS  OR 3-4 OZ OF JUICE OR REGULAR SODA OR ONE TUBE OF GLUCOSE GEL    STEP 2: RECHECK blood sugar in 15 MINUTES STEP 3: If your blood sugar is still low at the 15 minute recheck --> then, go back to STEP 1 and treat AGAIN with another 15 grams of carbohydrates.

## 2023-01-15 NOTE — Progress Notes (Signed)
Name: Ann [name]  Age/ Sex: 72 y.o., female   MRN/ DOB: [id number], [birth year]     PCP: Ma Hillock, DO   Reason for Endocrinology Evaluation: Type 2 Diabetes Mellitus  Initial Endocrine Consultative Visit: 09/22/2020    PATIENT IDENTIFIER: Ann [name] is a 72 y.o. female with a past medical history of T2Dm, A. Fib ,HTn and dyslipidemia, Has hx of colon cancer  . The patient has followed with Endocrinology clinic since 2021 for consultative assistance with management of her diabetes.  DIABETIC HISTORY:  Ms. Viereck was diagnosed with DM in 2013. Intolerant to Metformin.Farxiga and invokana caused genital irritation.Trulicity -caused urinary retention . Her hemoglobin A1c has ranged from 7.6% in 2022, peaking at 8.8% in 2022.    She had a normal thyroid ultrasound in May 2023, which was prompted by thyromegaly on physical exam.  She was followed up by Dr. Loanne Drilling from 2021 until March 2023 SUBJECTIVE:   During the last visit (08/25/2022): Marland Kitchen A1c 8.2 %     Today (01/15/2023): Ann [name] is here for a follow up on diabetes management .She checks her blood sugars multiple times a day through CGM. The patient has had hypoglycemic episodes since the last clinic visit, she is symptomatic with these episodes   She has noted knee pain and uses a cane due to unsteadiness  Follows with cardiology      HOME DIABETES REGIMEN:  Basaglar 50 units daily  Novolog per scale: 0-10 units < 150 30 units, 150-250 40 units, 250-300 60 units, >300    Statin: yes ACE-I/ARB: yes    CONTINUOUS GLUCOSE MONITORING RECORD INTERPRETATION    Dates of Recording:1/23-01/15/2023  Sensor description:dexcom  Results statistics:   CGM use % of time 71  Average and SD 225/71  Time in range 28 %  % Time Above 180 35  % Time above 250 36  % Time Below target <1      Glycemic patterns summary: Bg's trend down at night but high during the day   Hyperglycemic episodes   postprandial   Hypoglycemic episodes occurred after a bolus   Overnight periods: variable       DIABETIC COMPLICATIONS: Microvascular complications:  Denies:  Last Eye Exam: Completed 2023  Macrovascular complications:   Denies: CAD, CVA, PVD   HISTORY:  Past Medical History:  Past Medical History:  Diagnosis Date   Arthritis    osteoarthritis- knees.   Atrial fibrillation ([place])    Dr. Marcello Moores status post cardioversion chronic anticoagulation   Cellulitis of right lower extremity 12/30/2020   Chicken pox    Colon cancer ([facility]) 2001   Radiation, chemo.  Adenocarcinoma of the colon.  Followed by Dr. Derrel Nip   Complication of anesthesia    sensitive to narcotics"extra sleepy". Versed- "no memory recall for 4 days postop"   Diabetes mellitus without complication (HCC)    oral and Insulin   Hepatitis    "sub clinical case of Hepatitis in late 70's""has immunities"   Hypertension    Hypothyroidism    Laceration of head 08/04/2019   8 cm laceration forehead/scalp after tripping when mowing her lawn.   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   Ovarian teratoma, right 09/2000   Oophorectomy   Sleep apnea    unable to tolreate cpap   Wisdom teeth extracted    Past  Surgical History:  Past Surgical History:  Procedure Laterality Date   ABDOMINAL HYSTERECTOMY  09/2000    Total hysterectomy and oophorectomy   ABDOMINAL WOUND DEHISCENCE Bilateral    multiple times / mesh use with multiple abdominal hernia repair   CARDIOVERSION  2014   Unsuccessful.  Patient reports cardiac ablation was not felt to be appropriate next step by cardiology.   CHOLECYSTECTOMY  04/1998   '98 -[place]  2001   "colon cancer"   FOOT SURGERY Bilateral    multiple "hammer toes and bunionectomies"   KNEE ARTHROSCOPY Left 08/2006   meniscectomy   MELANOMA EXCISION      near scapula   TOTAL KNEE ARTHROPLASTY Right 01/24/2016   Procedure: RIGHT TOTAL KNEE ARTHROPLASTY;  Surgeon: Gaynelle Arabian, MD;  Location: [facility];  Service: Orthopedics;  Laterality: Right;   Social History:  reports that she quit smoking about 26 years ago. Her smoking use included cigarettes. She has never used smokeless tobacco. She reports that she does not drink alcohol and does not use drugs. Family History:  Family History  Problem Relation Age of Onset   Arthritis Mother    Colon cancer Mother    Diabetes Mother    Atrial fibrillation Mother    Diabetes Father    Heart attack Father    Heart disease Father    Stroke Father    Hypertension Maternal Grandmother    Stroke Maternal Grandmother    Stroke Maternal Grandfather    Hypertension Paternal Grandmother    Stroke Paternal Grandmother    Diabetes Paternal Grandfather    Hypertension Paternal Grandfather    Stroke Paternal Grandfather    Diabetes Brother    Brain cancer Brother    Heart disease Brother    Hypertension Brother    Stroke Brother      HOME MEDICATIONS: Allergies as of 01/15/2023       Reactions   Amoxicillin Shortness Of Breath, Swelling, Anaphylaxis   Has patient had a PCN reaction causing immediate rash, facial/tongue/throat swelling, SOB or lightheadedness with hypotension: Yes Has patient had a PCN reaction causing severe rash involving mucus membranes or skin necrosis: No Has patient had a PCN reaction that required hospitalization No Has patient had a PCN reaction occurring within the last 10 years: Yes If all of the above answers are "NO", then may proceed with Cephalosporin use. Has patient had a PCN reaction causing immediate rash, facial/tongue/throat swelling, SOB or lightheadedness with hypotension: Yes Has patient had a PCN reaction causing severe rash involving mucus membranes or skin necrosis: No Has patient had a PCN reaction that required hospitalization No Has patient had a PCN  reaction occurring within the last 10 years: Yes If all of the above answers are "NO", then may proceed with Cephalosporin use. Has patient had a PCN reaction causing immediate rash, facial/tongue/throat swelling, SOB or lightheadedness with hypotension: Yes Has patient had a PCN reaction causing severe rash involving mucus membranes or skin necrosis: No Has patient had a PCN reaction that required hospitalization No Has patient had a PCN reaction occurring within the last 10 years: Yes If all of the above answers are "NO", then may proceed with Cephalosporin use.   Codeine Itching   Tolerates hydrocodone   Ezetimibe    Livalo [pitavastatin]    Statins Other (See Comments)   Muscle skeletal side effects   Welchol [colesevelam]    Gi se        Medication List  Accurate as of January 15, 2023  1:03 PM. If you have any questions, ask your nurse or doctor.          allopurinol 100 MG tablet Commonly known as: ZYLOPRIM Take 100 mg by mouth daily.   ALPRAZolam 0.25 MG tablet Commonly known as: XANAX Take 1 tablet (0.25 mg total) by mouth daily as needed for anxiety.   Basaglar KwikPen 100 UNIT/ML Inject 46 Units into the skin every morning. What changed: how much to take   colchicine 0.6 MG tablet 2 tabs at onset of flare, followed by 1 tab 2 hours later. Then continue 1 tab daily until flare is resolved.   dabigatran 150 MG Caps capsule Commonly known as: PRADAXA Take 1 capsule (150 mg total) by mouth 2 (two) times daily.   Dexcom G6 Transmitter Misc USE AS DIRECTED   Dexcom G7 Receiver Devi 1 Device by Does not apply route as directed.   Dexcom G7 Sensor Misc APPLY 1 SENSORS EVERY 10 DAYS  DX E11.69   EpiPen 2-Pak 0.3 mg/0.3 mL Soaj injection Generic drug: EPINEPHrine Inject 0.3 mg into the skin as needed. Bee stings   levothyroxine 50 MCG tablet Commonly known as: SYNTHROID Take 1 tablet (50 mcg total) by mouth daily before breakfast.   metoprolol  tartrate 50 MG tablet Commonly known as: LOPRESSOR Take 1 tablet (50 mg total) by mouth 2 (two) times daily.   Novofine Pen Needle 32G X 6 MM Misc Generic drug: Insulin Pen Needle Inject 1 Device into the skin in the morning, at noon, in the evening, and at bedtime. Use as instructed to inject insulin   NovoLOG FlexPen 100 UNIT/ML FlexPen Generic drug: insulin aspart MAX DAILY 60 UNITS   olmesartan-hydrochlorothiazide 40-25 MG tablet Commonly known as: BENICAR HCT Take 1 tablet by mouth daily.   OneTouch Delica Plus [id number] Misc Apply 1 each topically 3 (three) times daily.   OneTouch Ultra test strip Generic drug: glucose blood SMARTSIG:Via Meter   triamcinolone ointment 0.5 % Commonly known as: KENALOG Apply topically 2 (two) times daily as needed.   zolpidem 5 MG tablet Commonly known as: AMBIEN Take 1 tablet (5 mg total) by mouth at bedtime as needed.         OBJECTIVE:   Vital Signs: BP 122/80 (BP Location: Left Arm, Patient Position: Sitting, Cuff Size: Large)   Pulse 82   Ht '5\' 8"'$  (1.727 m)   Wt 297 lb (134.7 kg)   SpO2 95%   BMI 45.16 kg/m   Wt Readings from Last 3 Encounters:  01/15/23 297 lb (134.7 kg)  11/14/22 297 lb 9.6 oz (135 kg)  08/25/22 293 lb 9.6 oz (133.2 kg)     Exam: General: Pt appears well and is in NAD  Right thyroid nodule palpated   Lungs: Clear with good BS bilat with no rales, rhonchi, or wheezes  Heart: RRR   Extremities: Non pitting  pretibial edema.   Neuro: MS is good with appropriate affect, pt is alert and Ox3   DM Foot Exam 08/25/2022 The skin of the feet is intact without sores or ulcerations. The pedal pulses are 2+ on right and 2+ on left. The sensation is intact to a screening 5.07, 10 gram monofilament bilaterally        DATA REVIEWED:  Lab Results  Component Value Date   HGBA1C 8.6 (A) 01/15/2023   HGBA1C 8.2 (A) 08/25/2022   HGBA1C 8.6 (A) 04/21/2022   Lab Results  Component Value Date  MICROALBUR 1.5 08/16/2022   LDLCALC 160 04/02/2019   CREATININE 1.08 02/14/2022   Lab Results  Component Value Date   MICRALBCREAT 0.9 08/16/2022     Lab Results  Component Value Date   CHOL 252 (H) 02/14/2022   HDL 46.80 02/14/2022   LDLCALC 160 04/02/2019   LDLDIRECT 149.0 02/14/2022   TRIG 398.0 (H) 02/14/2022   CHOLHDL 5 02/14/2022        Latest Reference Range & Units 02/14/22 11:27  Sodium 135 - 145 mEq/L 138  Potassium 3.5 - 5.1 mEq/L 4.1  Chloride 96 - 112 mEq/L 99  CO2 19 - 32 mEq/L 27  Glucose 70 - 99 mg/dL 212 (H)  BUN 6 - 23 mg/dL 31 (H)  Creatinine 0.40 - 1.20 mg/dL 1.08  Calcium 8.4 - 10.5 mg/dL 9.3  Alkaline Phosphatase 39 - 117 U/L 80  Albumin 3.5 - 5.2 g/dL 4.1  AST 0 - 37 U/L 21  ALT 0 - 35 U/L 19  Total Protein 6.0 - 8.3 g/dL 6.7  Total Bilirubin 0.2 - 1.2 mg/dL 1.0  GFR >60.00 mL/min 51.29 (L)    Latest Reference Range & Units 02/14/22 11:27  Total CHOL/HDL Ratio  5  Cholesterol 0 - 200 mg/dL 252 (H)  HDL Cholesterol >39.00 mg/dL 46.80  Direct LDL mg/dL 149.0  NonHDL  204.77  Triglycerides 0.0 - 149.0 mg/dL 398.0 (H)  VLDL 0.0 - 40.0 mg/dL 79.6 (H)    ASSESSMENT / PLAN / RECOMMENDATIONS:   1) Type 2 Diabetes Mellitus,Sub-optimally  controlled, without complications - Most recent A1c of 8.6 %. Goal A1c < 7.0 %.     - Pt with hyperglycemia  -Patient with fear of hypoglycemia which limits optimizing glucose control  -Patient declines any GLP-1 agonist due to risk of thyroid cancer -Intolerant to metformin and SGLT2 inhibitors - We discussed the importance of taking novolog with each meal, the pt states she does not eat, but in reviewing her CGM download, the pt has been noted with with 3 spikes during  the day, I have offered to refer her to our CDE as it appears she is eating things that cause hyperglycemia that she is not aware of, but would like to hold off for now  - I will provide her with a standing dose of novolog for meal/snack PLUS  correction scale to use before each meal   MEDICATIONS: Continue  Basaglar to 46 units daily NovoLog 10 units before each meal  Novolog 5 units before a snack  Correction scale: Novolog (BG-130/15)   EDUCATION / INSTRUCTIONS: BG monitoring instructions: Patient is instructed to check her blood sugars 3 times a day, before meals . Call [facility] if: BG persistently < 70  I reviewed the Rule of 15 for the treatment of hypoglycemia in detail with the patient. Literature supplied.    F/U in 4 months    I spent 25 minutes preparing to see the patient by review of recent labs, imaging and procedures, obtaining and reviewing separately obtained history, communicating with the patient, ordering medications, and documenting clinical information in the EHR including the differential Dx, treatment, and any further evaluation and other management      Signed electronically by: Mack Guise, MD  [facility]  [facility] [address] [place], [place] [address] Phone: [phone] FAX: [phone]   CC: Ma Hillock, DO [address] [place] [address] Phone: [phone]  Fax: [phone]  Return to Endocrinology  clinic as below: Future Appointments  Date Time Provider [place]  01/31/2023 11:00 AM Adrian Prows, MD [facility] None  07/11/2023  9:00 AM [id number] [place]

## 2023-01-16 ENCOUNTER — Encounter: Payer: Self-pay | Admitting: Internal Medicine

## 2023-01-31 ENCOUNTER — Ambulatory Visit: Payer: Medicare Other | Admitting: Cardiology

## 2023-01-31 ENCOUNTER — Encounter: Payer: Self-pay | Admitting: Cardiology

## 2023-01-31 VITALS — BP 120/80 | HR 78 | Resp 16 | Ht 68.0 in | Wt 295.4 lb

## 2023-01-31 DIAGNOSIS — I10 Essential (primary) hypertension: Secondary | ICD-10-CM

## 2023-01-31 DIAGNOSIS — E782 Mixed hyperlipidemia: Secondary | ICD-10-CM

## 2023-01-31 DIAGNOSIS — I4821 Permanent atrial fibrillation: Secondary | ICD-10-CM | POA: Diagnosis not present

## 2023-01-31 DIAGNOSIS — Z6841 Body Mass Index (BMI) 40.0 and over, adult: Secondary | ICD-10-CM | POA: Diagnosis not present

## 2023-01-31 MED ORDER — DABIGATRAN ETEXILATE MESYLATE 150 MG PO CAPS: 150.0000 mg | ORAL_CAPSULE | Freq: Two times a day (BID) | ORAL | 3 refills | Status: DC

## 2023-01-31 NOTE — Progress Notes (Signed)
Primary Physician/Referring:  Ma Hillock, DO  Patient ID: Ann [name], female    DOB: February 11, 1951, 72 y.o.   MRN: [id number]  Chief Complaint  Patient presents with   Atrial Fibrillation   Hypertension   Follow-up    1 year    HPI:    Ann [name]  is a 72 y.o.  Caucasian female patient with permanent atrial fibrillation, uncontrolled diabetes, hypertension, morbid obesity with OSA unable to tolerate CPAP, hyperlipidemia, unable to tolerate statins and also does not want to be on any lipid-lowering therapy.  She was also recommended sleep apnea evaluation, however patient did not want to try nocturnal oxygen supplementation as well.  Except for chronic mild dyspnea, arthritis in her knee, she is doing well.  This is annual visit.  Past Medical History:  Diagnosis Date   Arthritis    osteoarthritis- knees.   Atrial fibrillation ([place])    Dr. Marcello Moores status post cardioversion chronic anticoagulation   Cellulitis of right lower extremity 12/30/2020   Chicken pox    Colon cancer ([place]) 2001   Radiation, chemo.  Adenocarcinoma of the colon.  Followed by Dr. Derrel Nip   Complication of anesthesia    sensitive to narcotics"extra sleepy". Versed- "no memory recall for 4 days postop"   Diabetes mellitus without complication (HCC)    oral and Insulin   Hepatitis    "sub clinical case of Hepatitis in late 70's""has immunities"   Hypertension    Hypothyroidism    Laceration of head 08/04/2019   8 cm laceration forehead/scalp after tripping when mowing her lawn.   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   Ovarian teratoma, right 09/2000   Oophorectomy   Sleep apnea    unable to tolreate cpap   Wisdom teeth extracted    Past Surgical History:  Procedure Laterality Date   ABDOMINAL HYSTERECTOMY  09/2000    Total hysterectomy and oophorectomy   ABDOMINAL WOUND  DEHISCENCE Bilateral    multiple times / mesh use with multiple abdominal hernia repair   CARDIOVERSION  2014   Unsuccessful.  Patient reports cardiac ablation was not felt to be appropriate next step by cardiology.   CHOLECYSTECTOMY  04/1998   '98 -[place]  2001   "colon cancer"   FOOT SURGERY Bilateral    multiple "hammer toes and bunionectomies"   KNEE ARTHROSCOPY Left 08/2006   meniscectomy   MELANOMA EXCISION     near scapula   TOTAL KNEE ARTHROPLASTY Right 01/24/2016   Procedure: RIGHT TOTAL KNEE ARTHROPLASTY;  Surgeon: Gaynelle Arabian, MD;  Location: [facility];  Service: Orthopedics;  Laterality: Right;    Social History   Tobacco Use   Smoking status: Former    Years: 25.00    Types: Cigarettes    Quit date: 01/17/1997    Years since quitting: 26.0   Smokeless tobacco: Never  Substance Use Topics   Alcohol use: No   Marital Status: Single  ROS  Review of Systems  Cardiovascular:  Positive for dyspnea on exertion and leg swelling. Negative for chest pain.  Gastrointestinal:  Negative for melena.   Objective  Blood pressure 120/80, pulse 78, resp. rate 16, height 5' 8"$  (1.727 m), weight 295 lb 6.4 oz (134 kg), SpO2 97 %. Body mass index is 44.92 kg/m.     01/31/2023   10:41  AM 01/15/2023   12:48 PM 11/14/2022    3:40 PM  Vitals with BMI  Height 5' 8"$  5' 8"$    Weight 295 lbs 6 oz 297 lbs 297 lbs 10 oz  BMI [id number]   Systolic [id number]  Diastolic 80 80 74  Pulse 78 82 83     Physical Exam Constitutional:      Appearance: She is morbidly obese.  Neck:     Vascular: Carotid bruit (right carotid bruit) present.  Cardiovascular:     Rate and Rhythm: Normal rate. Rhythm irregular.     Pulses: Normal pulses and intact distal pulses.     Heart sounds: No murmur heard.    No gallop. No S3 or S4 sounds.  Pulmonary:     Effort: Pulmonary effort is normal.     Breath sounds: Normal breath sounds.  Abdominal:     General: Bowel sounds are  normal.     Palpations: Abdomen is soft.  Musculoskeletal:     Right lower leg: Edema (2 + pitting edema) present.     Left lower leg: Edema (2 + pitting edema) present.    Laboratory examination:   Lab Results  Component Value Date   NA 138 02/14/2022   K 4.1 02/14/2022   CO2 27 02/14/2022   GLUCOSE 212 (H) 02/14/2022   BUN 31 (H) 02/14/2022   CREATININE 1.08 02/14/2022   CALCIUM 9.3 02/14/2022   GFRNONAA >60 01/26/2016       Latest Ref Rng & Units 02/14/2022   11:27 AM 03/18/2021   11:53 AM 08/23/2020    1:35 PM  CMP  Glucose 70 - 99 mg/dL 212  164  199   BUN 6 - 23 mg/dL 31  17  29   $ Creatinine 0.40 - 1.20 mg/dL 1.08  0.89  1.09   Sodium 135 - 145 mEq/L 138  136  135   Potassium 3.5 - 5.1 mEq/L 4.1  4.2  3.9   Chloride 96 - 112 mEq/L 99  98  98   CO2 19 - 32 mEq/L 27  29  27   $ Calcium 8.4 - 10.5 mg/dL 9.3  8.9  9.2   Total Protein 6.0 - 8.3 g/dL 6.7  6.9    Total Bilirubin 0.2 - 1.2 mg/dL 1.0  1.4    Alkaline Phos 39 - 117 U/L 80  73    AST 0 - 37 U/L 21  19    ALT 0 - 35 U/L 19  16        Latest Ref Rng & Units 02/14/2022   11:27 AM 03/18/2021   11:53 AM 04/20/2020   10:53 AM  CBC  WBC 4.0 - 10.5 K/uL 7.5  9.0  8.7   Hemoglobin 12.0 - 15.0 g/dL 14.6  15.1  14.8   Hematocrit 36.0 - 46.0 % 43.4  45.0  43.8   Platelets 150.0 - 400.0 K/uL 159.0  175.0  141.0     Lipid Panel     Component Value Date/Time   CHOL 252 (H) 02/14/2022 1127   TRIG 398.0 (H) 02/14/2022 1127   HDL 46.80 02/14/2022 1127   CHOLHDL 5 02/14/2022 1127   VLDL 79.6 (H) 02/14/2022 1127   LDLCALC 160 04/02/2019 0000   LDLDIRECT 149.0 02/14/2022 1127    HEMOGLOBIN A1C Lab Results  Component Value Date   HGBA1C 8.6 (A) 01/15/2023   TSH Recent Labs    02/14/22 1127  TSH 4.38   Medications and  allergies   Allergies  Allergen Reactions   Amoxicillin Shortness Of Breath, Swelling and Anaphylaxis    Has patient had a PCN reaction causing immediate rash, facial/tongue/throat swelling, SOB  or lightheadedness with hypotension: Yes Has patient had a PCN reaction causing severe rash involving mucus membranes or skin necrosis: No Has patient had a PCN reaction that required hospitalization No Has patient had a PCN reaction occurring within the last 10 years: Yes If all of the above answers are "NO", then may proceed with Cephalosporin use.  Has patient had a PCN reaction causing immediate rash, facial/tongue/throat swelling, SOB or lightheadedness with hypotension: Yes Has patient had a PCN reaction causing severe rash involving mucus membranes or skin necrosis: No Has patient had a PCN reaction that required hospitalization No Has patient had a PCN reaction occurring within the last 10 years: Yes If all of the above answers are "NO", then may proceed with Cephalosporin use. Has patient had a PCN reaction causing immediate rash, facial/tongue/throat swelling, SOB or lightheadedness with hypotension: Yes Has patient had a PCN reaction causing severe rash involving mucus membranes or skin necrosis: No Has patient had a PCN reaction that required hospitalization No Has patient had a PCN reaction occurring within the last 10 years: Yes If all of the above answers are "NO", then may proceed with Cephalosporin use.    Codeine Itching    Tolerates hydrocodone   Ezetimibe    Livalo [Pitavastatin]    Statins Other (See Comments)    Muscle skeletal side effects   Welchol [Colesevelam]     Gi se    Current Outpatient Medications:    ALPRAZolam (XANAX) 0.25 MG tablet, Take 1 tablet (0.25 mg total) by mouth daily as needed for anxiety., Disp: 30 tablet, Rfl: 5   Continuous Blood Gluc Receiver ([place]) DEVI, 1 Device by Does not apply route as directed., Disp: 1 each, Rfl: 0   Continuous Blood Gluc Sensor (DEXCOM G7 SENSOR) MISC, APPLY 1 SENSORS EVERY 10 DAYS  DX E11.69, Disp: 9 each, Rfl: 3   EPIPEN 2-PAK 0.3 MG/0.3ML SOAJ injection, Inject 0.3 mg into the skin as needed. Bee  stings, Disp: , Rfl: 1   Insulin Glargine (BASAGLAR KWIKPEN) 100 UNIT/ML, Inject 46 Units into the skin every morning. (Patient taking differently: Inject 50 Units into the skin every morning.), Disp: 45 mL, Rfl: 3   Insulin Pen Needle (NOVOFINE PEN NEEDLE) 32G X 6 MM MISC, Inject 1 Device into the skin in the morning, at noon, in the evening, and at bedtime. Use as instructed to inject insulin, Disp: 400 each, Rfl: 3   Lancets (ONETOUCH DELICA PLUS 123XX123) MISC, Apply 1 each topically 3 (three) times daily., Disp: , Rfl:    levothyroxine (SYNTHROID) 50 MCG tablet, Take 1 tablet (50 mcg total) by mouth daily before breakfast., Disp: 90 tablet, Rfl: 3   metoprolol tartrate (LOPRESSOR) 50 MG tablet, Take 1 tablet (50 mg total) by mouth 2 (two) times daily., Disp: 180 tablet, Rfl: 1   NOVOLOG FLEXPEN 100 UNIT/ML FlexPen, MAX DAILY 60 UNITS (Patient taking differently: MAX DAILY 50 UNITS), Disp: 60 mL, Rfl: 3   olmesartan-hydrochlorothiazide (BENICAR HCT) 40-25 MG tablet, Take 1 tablet by mouth daily., Disp: 90 tablet, Rfl: 1   ONETOUCH ULTRA test strip, SMARTSIG:Via Meter, Disp: , Rfl:    triamcinolone ointment (KENALOG) 0.5 %, Apply topically 2 (two) times daily as needed., Disp: , Rfl:    zolpidem (AMBIEN) 5 MG tablet, Take 1 tablet (  5 mg total) by mouth at bedtime as needed., Disp: 30 tablet, Rfl: 5   allopurinol (ZYLOPRIM) 100 MG tablet, Take 100 mg by mouth daily. (Patient not taking: Reported on 01/31/2023), Disp: , Rfl:    colchicine 0.6 MG tablet, 2 tabs at onset of flare, followed by 1 tab 2 hours later. Then continue 1 tab daily until flare is resolved. (Patient not taking: Reported on 01/31/2023), Disp: 30 tablet, Rfl: 5   Continuous Blood Gluc Transmit (DEXCOM G6 TRANSMITTER) MISC, USE AS DIRECTED, Disp: 1 each, Rfl: 3   dabigatran (PRADAXA) 150 MG CAPS capsule, Take 1 capsule (150 mg total) by mouth 2 (two) times daily., Disp: 200 capsule, Rfl: 3   Radiology:   No results  found.  Cardiac Studies:   Lexiscan Tetrofosmin stress test 05/25/2021: Lexiscan nuclear stress test performed using 1-day protocol. SPECT images show small sized, mild intensity, reversible perfusion defect in mid to basal inferolateral myocardium. Stress LVEF 73%. Low risk study.  Echocardiogram 05/25/2021: Left ventricle cavity is normal in size. Mild concentric hypertrophy of the left ventricle. Normal LV systolic function with visual EF 55-60%. Normal global wall motion. Unable to evaluate diastolic function due to atrial fibrillation. Left atrial cavity is moderately dilated. Mild tricuspid regurgitation. Estimated pulmonary artery systolic pressure 32 mmHg. No significant change compared to previous study in 2019.  Carotid artery duplex 06/01/2021: Duplex suggests stenosis in the right internal carotid artery (<50%).  Duplex suggests stenosis in the right common carotid artery (<50%), probably minimal heterogenous plaque. Duplex suggests stenosis in the left internal carotid artery (<50%), probably minimal heterogenous plaque. Duplex suggests stenosis in the left external carotid artery (<50%). Antegrade right vertebral artery flow. Antegrade left vertebral artery flow. Follow up is appropriate if clinically indicated.   EKG:   EKG 01/31/2023: Atrial fibrillation with controlled ventricular response at that of 72 bpm, otherwise no significant ST-T wave abnormality.  Normal QT interval.  Compared to 01/30/2022, no change. No significant change from 04/27/2021.  Assessment     ICD-10-CM   1. Permanent atrial fibrillation (HCC)  I48.21 EKG 12-Lead    dabigatran (PRADAXA) 150 MG CAPS capsule    2. Primary hypertension  I10     3. Mixed hyperlipidemia  E78.2     4. Class 3 severe obesity due to excess calories with serious comorbidity and body mass index (BMI) of 40.0 to 44.9 in adult (HCC)  E66.01    Z68.41       CHA2DS2-VASc Score is 4.  Yearly risk of stroke: 4.8% (A, Female,  HTN, DM).  Score of 1=0.6; 2=2.2; 3=3.2; 4=4.8; 5=7.2; 6=9.8; 7=>9.8) -(CHF; HTN; vasc disease DM,  Female = 1; Age <65 =0; 65-74 = 1,  >75 =2; stroke/embolism= 2).    Medications Discontinued During This Encounter  Medication Reason   dabigatran (PRADAXA) 150 MG CAPS capsule Reorder     Meds ordered this encounter  Medications   dabigatran (PRADAXA) 150 MG CAPS capsule    Sig: Take 1 capsule (150 mg total) by mouth 2 (two) times daily.    Dispense:  200 capsule    Refill:  3    Orders Placed This Encounter  Procedures   EKG 12-Lead   Recommendations:   Ann [name] is a 72 y.o. Caucasian female patient with permanent atrial fibrillation, uncontrolled diabetes, hypertension, morbid obesity with OSA unable to tolerate CPAP, hyperlipidemia, unable to tolerate statins and also does not want to be on any lipid-lowering therapy.    1.  Permanent atrial fibrillation (HCC) Except for chronic mild dyspnea, she is doing well and there is no clinical evidence of heart failure, she remains asymptomatic, atrial fibrillation is rate controlled and she is on Pradaxa.  She was taking Pradaxa only once a day as she occasionally uses NSAIDs for arthritis.  Advised her the efficacy would be lessened and her risk of stroke could increase.  I resent a prescription for twice daily dosing.  She is tolerating this well and does not want to change to any of anticoag.  2. Primary hypertension Blood pressure is well-controlled.  3. Mixed hyperlipidemia She does not want to be on a statin although her lipids are markedly elevated.  She does have mild carotid atherosclerosis but no high-grade stenosis.  4. Class 3 severe obesity due to excess calories with serious comorbidity and body mass index (BMI) of 40.0 to 44.9 in adult [facility]) I discussed with her in view of diabetes, Ozempic or Wegovy would be a good choice for her to help with weight loss as well.  She will discuss with her PCP regarding  this.  Overall patient is stable from cardiac standpoint, she needs left knee replacement, however weight has been an issue that is prohibiting her from having surgery.   We will see him back on an annual basis.  Weight loss again discussed.      Adrian Prows, MD, [facility] 01/31/2023, 11:56 AM Office: [phone]

## 2023-02-01 ENCOUNTER — Telehealth: Payer: Self-pay

## 2023-02-01 NOTE — Telephone Encounter (Signed)
Patient states insurance will not cover Novolog but will cover Fiasp.  Patient would like a 90 day script if change is appropriate

## 2023-02-02 MED ORDER — FIASP FLEXTOUCH 100 UNIT/ML ~~LOC~~ SOPN: PEN_INJECTOR | SUBCUTANEOUS | 3 refills | Status: DC

## 2023-02-16 ENCOUNTER — Telehealth: Payer: Self-pay | Admitting: Family Medicine

## 2023-02-16 NOTE — Telephone Encounter (Signed)
noted 

## 2023-02-16 NOTE — Telephone Encounter (Signed)
Called pt to see if she is needing to have separate appt for UTI symptoms (acute appt). Pt is being seen for chronic conditions at scheduled appt.

## 2023-02-16 NOTE — Telephone Encounter (Signed)
Pt is asking that a urinalysis be adding to her labs for it to be done at her upcoming appt and reports that she will explain why she is requesting this at her appt.

## 2023-02-21 ENCOUNTER — Encounter: Payer: Self-pay | Admitting: Family Medicine

## 2023-02-21 ENCOUNTER — Ambulatory Visit (INDEPENDENT_AMBULATORY_CARE_PROVIDER_SITE_OTHER): Payer: Medicare Other | Admitting: Family Medicine

## 2023-02-21 VITALS — BP 118/64 | HR 64 | Temp 98.1°F | Wt 296.8 lb

## 2023-02-21 DIAGNOSIS — G479 Sleep disorder, unspecified: Secondary | ICD-10-CM

## 2023-02-21 DIAGNOSIS — I10 Essential (primary) hypertension: Secondary | ICD-10-CM

## 2023-02-21 DIAGNOSIS — M109 Gout, unspecified: Secondary | ICD-10-CM

## 2023-02-21 DIAGNOSIS — R2681 Unsteadiness on feet: Secondary | ICD-10-CM

## 2023-02-21 DIAGNOSIS — G8929 Other chronic pain: Secondary | ICD-10-CM

## 2023-02-21 DIAGNOSIS — E785 Hyperlipidemia, unspecified: Secondary | ICD-10-CM | POA: Diagnosis not present

## 2023-02-21 DIAGNOSIS — E034 Atrophy of thyroid (acquired): Secondary | ICD-10-CM

## 2023-02-21 DIAGNOSIS — I4821 Permanent atrial fibrillation: Secondary | ICD-10-CM | POA: Diagnosis not present

## 2023-02-21 DIAGNOSIS — F419 Anxiety disorder, unspecified: Secondary | ICD-10-CM

## 2023-02-21 DIAGNOSIS — M25562 Pain in left knee: Secondary | ICD-10-CM | POA: Diagnosis not present

## 2023-02-21 DIAGNOSIS — E1169 Type 2 diabetes mellitus with other specified complication: Secondary | ICD-10-CM

## 2023-02-21 LAB — COMPREHENSIVE METABOLIC PANEL
ALT: 15 U/L (ref 0–35)
AST: 19 U/L (ref 0–37)
Albumin: 3.7 g/dL (ref 3.5–5.2)
Alkaline Phosphatase: 76 U/L (ref 39–117)
BUN: 26 mg/dL — ABNORMAL HIGH (ref 6–23)
CO2: 26 mEq/L (ref 19–32)
Calcium: 8.4 mg/dL (ref 8.4–10.5)
Chloride: 99 mEq/L (ref 96–112)
Creatinine, Ser: 0.94 mg/dL (ref 0.40–1.20)
GFR: 60.16 mL/min (ref >60.00)
Glucose, Bld: 202 mg/dL — ABNORMAL HIGH (ref 70–99)
Potassium: 3.7 mEq/L (ref 3.5–5.1)
Sodium: 137 mEq/L (ref 135–145)
Total Bilirubin: 1 mg/dL (ref 0.2–1.2)
Total Protein: 6.2 g/dL (ref 6.0–8.3)

## 2023-02-21 LAB — CBC
HCT: 41.4 % (ref 36.0–46.0)
Hemoglobin: 14 g/dL (ref 12.0–15.0)
MCHC: 33.8 g/dL (ref 30.0–36.0)
MCV: 92.4 fl (ref 78.0–100.0)
Platelets: 143 10*3/uL — ABNORMAL LOW (ref 150.0–400.0)
RBC: 4.48 Mil/uL (ref 3.87–5.11)
RDW: 14.1 % (ref 11.5–15.5)
WBC: 8.1 10*3/uL (ref 4.0–10.5)

## 2023-02-21 LAB — LIPID PANEL
Cholesterol: 232 mg/dL — ABNORMAL HIGH (ref 0–200)
HDL: 41.3 mg/dL (ref >39.00)
NonHDL: 190.42
Total CHOL/HDL Ratio: 6
Triglycerides: 311 mg/dL — ABNORMAL HIGH (ref 0.0–149.0)
VLDL: 62.2 mg/dL — ABNORMAL HIGH (ref 0.0–40.0)

## 2023-02-21 LAB — T4, FREE: Free T4: 1.09 ng/dL (ref 0.60–1.60)

## 2023-02-21 LAB — LDL CHOLESTEROL, DIRECT: Direct LDL: 139 mg/dL

## 2023-02-21 LAB — TSH: TSH: 3.51 u[IU]/mL (ref 0.35–5.50)

## 2023-02-21 LAB — URIC ACID: Uric Acid, Serum: 8.4 mg/dL — ABNORMAL HIGH (ref 2.4–7.0)

## 2023-02-21 MED ORDER — OLMESARTAN MEDOXOMIL-HCTZ 40-25 MG PO TABS: 1.0000 | ORAL_TABLET | Freq: Every day | ORAL | 1 refills | Status: DC

## 2023-02-21 MED ORDER — ALPRAZOLAM 0.25 MG PO TABS: 0.2500 mg | ORAL_TABLET | Freq: Every day | ORAL | 5 refills | Status: DC | PRN

## 2023-02-21 MED ORDER — METOPROLOL TARTRATE 50 MG PO TABS: 50.0000 mg | ORAL_TABLET | Freq: Two times a day (BID) | ORAL | 1 refills | Status: DC

## 2023-02-21 MED ORDER — LEVOTHYROXINE SODIUM 50 MCG PO TABS: 50.0000 ug | ORAL_TABLET | Freq: Every day | ORAL | 3 refills | Status: DC

## 2023-02-21 MED ORDER — ZOLPIDEM TARTRATE 5 MG PO TABS: 5.0000 mg | ORAL_TABLET | Freq: Every evening | ORAL | 5 refills | Status: DC | PRN

## 2023-02-21 NOTE — Progress Notes (Signed)
Patient ID: Ann [name], female  DOB: 06-Nov-1951, 72 y.o.   MRN: [id number] Patient Care Team    Relationship Specialty Notifications Start End  Ma Hillock, DO PCP - General Family Medicine  04/20/20   Adrian Prows, MD Consulting Physician Cardiology  04/20/20   Druscilla Brownie, MD Referring Physician Dermatology  04/20/20   Otelia Sergeant, OD Referring Physician   04/20/20   Luci Bank, MD Referring Physician Orthotics  04/20/20   Sonda Primes, MD Referring Physician Obstetrics and Gynecology  04/20/20   Rosana Berger, MD Referring Physician Gastroenterology  04/20/20   Shamleffer, Melanie Crazier, MD Attending Physician Endocrinology  01/31/23     Chief Complaint  Patient presents with   Hypertension    Subjective: Ann [name] is a 72 y.o.  female present for [facility] HTN/HLD/morbid obesity/atrial fibrillation: Pt reports compliance with metoprolol 50 mg twice daily and olmesartan-HCTZ 40-25 mg daily. Blood pressures ranges at home than normal limits.  Patient denies chest pain, shortness of breath, dizziness or lower extremity edema.  Pt declined statin. RF: Hypertension, hyperlipidemia, obesity, diabetes, fib, family history of heart disease  Gout: She reports she has not had any additional gout flares since last being seen.  She is not taking the allopurinol, as she felt it may have caused her to have diarrhea so she discontinued.  However now she is not certain if it was the allopurinol that was creating her diarrhea.  She reports she would consider restarting allopurinol if uric acid levels are still above normal. Prior note: Presents today with concerns of not having uric acid tested here last visit, for her large toe discomfort that was being managed by her podiatry team.  At the time of our last visit patient reported her podiatrist thought that she had gout/tophi of her left large toe and recommended she had a uric acid tested.  He had ordered a uric acid at [facility]  for her.  I advised her at that time that management should be completed through the treating team at the time, since we recovering multiple chronic conditions for her during her scheduled appointment and she was not present for the new gout workup. Today she states that she is upset that she did not have the uric acid completed here.  She was on a social media form and there was a post about "doctors that refuse to perform tests, "in which patient's ask for be completed. Of note, patient would rather not take allopurinol daily to prevent her gout flares which occur 1-2 times a year.  She was seen at urgent care recently and had a uric acid level of 8.3 and started on allopurinol during a flare for treatment.  She would like to have her uric acid levels tested again and have her gout treatment completed here.  Sleep disturbance/anxiety She states with the Ambien she is able to get better sleep.  She will take Xanax at times, she uses Xanax as needed only for increased anxiety or unable to fall asleep.  She denies oversedation. Prior note: Patient presents today to discuss her Xanax use.  She uses her Xanax very infrequently.  She states she only uses Xanax when she is feeling like she is having increased anxiety or panic, which is usually preceded by her feeling that her heart rate is increasing.  She states the Xanax will resolve this feeling quite quickly.  She had a prescription that was written last May for 15 tabs of Xanax  0.25 mg and 1 refill.  She was not aware she had the refill, but is down to only 2 Xanax left and she would like refills today.  She did not seem to realize the law and [place] in which she needs to be seen every 6 months to renew prescriptions that are controlled substances, and that they will automatically expire after 6 months whether there are refills left on the prescription or not.  Patient did sign a contract last year, we will update this today.  UDS was also collected  last year. She states she still is not sleeping well without the Ambien.  She had been on Ambien 10 mg nightly in the past and then was having difficulties receiving Ambien prescriptions through her prior PCP.  We discussed today that Ambien 5 mg is the maximum recommended dose for age.  She should be able to receive this medication.  She states that she thought Medicare would not pay for this medication in people over 65. Prior note: Patient reports she had been on Ambien up until last year.  She states her prior PCP would not refill her Ambien for her.  She states she still does not sleep well, but she will manage since she cannot have the Ambien.  By records it appears she was tried on Klonopin within the last few months.  She did not feel she needed type of medication for sleep, but needed a hypnotic.  She reports she has had a prescription for Xanax that dates back to around 2018.  She uses this when she has increased anxiety or heart racing.  She reports she is used this about 9 times in 3 years.  She would like a refill on Xanax.  She states her prior PCP would not refill Xanax or Ambien, therefore she left the practice.     07/05/2022    9:55 AM 02/14/2022   10:59 AM 09/12/2021   11:08 AM 06/22/2021    9:58 AM 03/18/2021   11:08 AM  Depression screen PHQ 2/9  Decreased Interest 0 0 0 0 0  Down, Depressed, Hopeless 0 0 0 0 0  PHQ - 2 Score 0 0 0 0 0  Altered sleeping  3 3    Tired, decreased energy  0 0    Change in appetite  0 2    Feeling bad or failure about yourself   0 0    Trouble concentrating  0 0    Moving slowly or fidgety/restless  0 0    Suicidal thoughts  0 0    PHQ-9 Score  3 5        02/14/2022   11:01 AM 09/12/2021   11:08 AM  GAD 7 : Generalized Anxiety Score  Nervous, Anxious, on Edge 0 1  Control/stop worrying 0 0  Worry too much - different things 0 0  Trouble relaxing 0 0  Restless 0 0  Easily annoyed or irritable 0 0  Afraid - awful might happen 0 0  Total GAD  7 Score 0 1          07/05/2022    9:57 AM 09/12/2021   10:53 AM 06/22/2021    9:56 AM 03/18/2021   11:08 AM 07/23/2020   11:16 AM  Fall Risk   Falls in the past year? 0 0 0 0 1  Number falls in past yr: 0 0 0 0 0  Injury with Fall? 0 0 0 0 1  Risk for  fall due to : Impaired vision;Impaired mobility;Impaired balance/gait      Follow up Falls prevention discussed Falls evaluation completed Falls evaluation completed;Falls prevention discussed      Immunization History  Administered Date(s) Administered   COVID-19, mRNA, vaccine(Comirnaty)12 years and older 10/02/2022   Fluad Quad(high Dose 65+) 09/26/2021, 09/16/2022   Influenza-Unspecified 09/26/2019   Moderna SARS-COV2 Booster Vaccination 09/02/2021   Moderna Sars-Covid-2 Vaccination 01/22/2020, 02/20/2020, 10/20/2020   Pneumococcal Conjugate-13 10/12/2014   Pneumococcal Polysaccharide-23 10/21/2019   Tdap 08/04/2019    No results found.  Past Medical History:  Diagnosis Date   Arthritis    osteoarthritis- knees.   Atrial fibrillation ([place])    Dr. Marcello Moores status post cardioversion chronic anticoagulation   Cellulitis of right lower extremity 12/30/2020   Chicken pox    Colon cancer ([place]) 2001   Radiation, chemo.  Adenocarcinoma of the colon.  Followed by Dr. Derrel Nip   Complication of anesthesia    sensitive to narcotics"extra sleepy". Versed- "no memory recall for 4 days postop"   Diabetes mellitus without complication (HCC)    oral and Insulin   Hepatitis    "sub clinical case of Hepatitis in late 70's""has immunities"   Hypertension    Hypothyroidism    Laceration of head 08/04/2019   8 cm laceration forehead/scalp after tripping when mowing her lawn.   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   Ovarian teratoma, right 09/2000   Oophorectomy   Sleep apnea    unable to tolreate cpap   Wisdom teeth extracted     Allergies  Allergen Reactions   Amoxicillin Shortness Of Breath, Swelling and Anaphylaxis    Has patient had a PCN reaction causing immediate rash, facial/tongue/throat swelling, SOB or lightheadedness with hypotension: Yes Has patient had a PCN reaction causing severe rash involving mucus membranes or skin necrosis: No Has patient had a PCN reaction that required hospitalization No Has patient had a PCN reaction occurring within the last 10 years: Yes If all of the above answers are "NO", then may proceed with Cephalosporin use.  Has patient had a PCN reaction causing immediate rash, facial/tongue/throat swelling, SOB or lightheadedness with hypotension: Yes Has patient had a PCN reaction causing severe rash involving mucus membranes or skin necrosis: No Has patient had a PCN reaction that required hospitalization No Has patient had a PCN reaction occurring within the last 10 years: Yes If all of the above answers are "NO", then may proceed with Cephalosporin use. Has patient had a PCN reaction causing immediate rash, facial/tongue/throat swelling, SOB or lightheadedness with hypotension: Yes Has patient had a PCN reaction causing severe rash involving mucus membranes or skin necrosis: No Has patient had a PCN reaction that required hospitalization No Has patient had a PCN reaction occurring within the last 10 years: Yes If all of the above answers are "NO", then may proceed with Cephalosporin use.    Codeine Itching    Tolerates hydrocodone   Ezetimibe    Livalo [Pitavastatin]    Statins Other (See Comments)    Muscle skeletal side effects   Welchol [Colesevelam]     Gi se   Past Surgical History:  Procedure Laterality Date   ABDOMINAL HYSTERECTOMY  09/2000    Total hysterectomy and oophorectomy   ABDOMINAL WOUND DEHISCENCE Bilateral    multiple times / mesh use with multiple abdominal hernia repair  CARDIOVERSION  2014   Unsuccessful.  Patient reports cardiac ablation was  not felt to be appropriate next step by cardiology.   CHOLECYSTECTOMY  04/1998   '98 -[place]  2001   "colon cancer"   FOOT SURGERY Bilateral    multiple "hammer toes and bunionectomies"   KNEE ARTHROSCOPY Left 08/2006   meniscectomy   MELANOMA EXCISION     near scapula   TOTAL KNEE ARTHROPLASTY Right 01/24/2016   Procedure: RIGHT TOTAL KNEE ARTHROPLASTY;  Surgeon: Gaynelle Arabian, MD;  Location: [facility];  Service: Orthopedics;  Laterality: Right;   Family History  Problem Relation Age of Onset   Arthritis Mother    Colon cancer Mother    Diabetes Mother    Atrial fibrillation Mother    Diabetes Father    Heart attack Father    Heart disease Father    Stroke Father    Hypertension Maternal Grandmother    Stroke Maternal Grandmother    Stroke Maternal Grandfather    Hypertension Paternal Grandmother    Stroke Paternal Grandmother    Diabetes Paternal Grandfather    Hypertension Paternal Grandfather    Stroke Paternal Grandfather    Diabetes Brother    Brain cancer Brother    Heart disease Brother    Hypertension Brother    Stroke Brother    Social History   Social History Narrative   Marital status/children/pets: Single. G0P0.   Education/employment: Retired Oceanographer:      -smoke alarm in the home:Yes     - wears seatbelt: Yes     - Feels safe in their relationships: Yes    Allergies as of 02/21/2023       Reactions   Amoxicillin Shortness Of Breath, Swelling, Anaphylaxis   Has patient had a PCN reaction causing immediate rash, facial/tongue/throat swelling, SOB or lightheadedness with hypotension: Yes Has patient had a PCN reaction causing severe rash involving mucus membranes or skin necrosis: No Has patient had a PCN reaction that required hospitalization No Has patient had a PCN reaction occurring within the last 10 years: Yes If all of the above answers are "NO", then may proceed with Cephalosporin  use. Has patient had a PCN reaction causing immediate rash, facial/tongue/throat swelling, SOB or lightheadedness with hypotension: Yes Has patient had a PCN reaction causing severe rash involving mucus membranes or skin necrosis: No Has patient had a PCN reaction that required hospitalization No Has patient had a PCN reaction occurring within the last 10 years: Yes If all of the above answers are "NO", then may proceed with Cephalosporin use. Has patient had a PCN reaction causing immediate rash, facial/tongue/throat swelling, SOB or lightheadedness with hypotension: Yes Has patient had a PCN reaction causing severe rash involving mucus membranes or skin necrosis: No Has patient had a PCN reaction that required hospitalization No Has patient had a PCN reaction occurring within the last 10 years: Yes If all of the above answers are "NO", then may proceed with Cephalosporin use.   Codeine Itching   Tolerates hydrocodone   Ezetimibe    Livalo [pitavastatin]    Statins Other (See Comments)   Muscle skeletal side effects   Welchol [colesevelam]    Gi se        Medication List        Accurate as of February 21, 2023 10:42 AM. If you have any questions, ask your nurse or doctor.  STOP taking these medications    allopurinol 100 MG tablet Commonly known as: ZYLOPRIM Stopped by: Howard Pouch, DO       TAKE these medications    ALPRAZolam 0.25 MG tablet Commonly known as: XANAX Take 1 tablet (0.25 mg total) by mouth daily as needed for anxiety.   Basaglar KwikPen 100 UNIT/ML Inject 46 Units into the skin every morning. What changed: how much to take   colchicine 0.6 MG tablet 2 tabs at onset of flare, followed by 1 tab 2 hours later. Then continue 1 tab daily until flare is resolved.   dabigatran 150 MG Caps capsule Commonly known as: PRADAXA Take 1 capsule (150 mg total) by mouth 2 (two) times daily.   Dexcom G6 Transmitter Misc USE AS DIRECTED   Dexcom G7  Receiver Devi 1 Device by Does not apply route as directed.   Dexcom G7 Sensor Misc APPLY 1 SENSORS EVERY 10 DAYS  DX E11.69   EpiPen 2-Pak 0.3 mg/0.3 mL Soaj injection Generic drug: EPINEPHrine Inject 0.3 mg into the skin as needed. Bee stings   Fiasp FlexTouch 100 UNIT/ML FlexTouch Pen Generic drug: insulin aspart Max daily 60 units   levothyroxine 50 MCG tablet Commonly known as: SYNTHROID Take 1 tablet (50 mcg total) by mouth daily before breakfast.   metoprolol tartrate 50 MG tablet Commonly known as: LOPRESSOR Take 1 tablet (50 mg total) by mouth 2 (two) times daily.   Novofine Pen Needle 32G X 6 MM Misc Generic drug: Insulin Pen Needle Inject 1 Device into the skin in the morning, at noon, in the evening, and at bedtime. Use as instructed to inject insulin   olmesartan-hydrochlorothiazide 40-25 MG tablet Commonly known as: BENICAR HCT Take 1 tablet by mouth daily.   OneTouch Delica Plus 0000000 Misc Apply 1 each topically 3 (three) times daily.   OneTouch Ultra test strip Generic drug: glucose blood SMARTSIG:Via Meter   triamcinolone ointment 0.5 % Commonly known as: KENALOG Apply topically 2 (two) times daily as needed.   zolpidem 5 MG tablet Commonly known as: AMBIEN Take 1 tablet (5 mg total) by mouth at bedtime as needed.        All past medical history, surgical history, allergies, family history, immunizations andmedications were updated in the EMR today and reviewed under the history and medication portions of their EMR.     ROS: 14 pt review of systems performed and negative (unless mentioned in an HPI)  Objective: BP 118/64   Pulse 64   Temp 98.1 F (36.7 C)   Wt 296 lb 12.8 oz (134.6 kg)   SpO2 96%   BMI 45.13 kg/m  Physical Exam Vitals and nursing note reviewed.  Constitutional:      General: She is not in acute distress.    Appearance: Normal appearance. She is obese. She is not ill-appearing, toxic-appearing or diaphoretic.   HENT:     Head: Normocephalic and atraumatic.  Eyes:     General: No scleral icterus.       Right eye: No discharge.        Left eye: No discharge.     Extraocular Movements: Extraocular movements intact.     Conjunctiva/sclera: Conjunctivae normal.     Pupils: Pupils are equal, round, and reactive to light.  Cardiovascular:     Rate and Rhythm: Normal rate and regular rhythm.     Heart sounds: No murmur heard.    No gallop.  Pulmonary:     Effort: Pulmonary effort  is normal. No respiratory distress.     Breath sounds: Normal breath sounds. No wheezing, rhonchi or rales.  Musculoskeletal:     Cervical back: Neck supple. No tenderness.     Right lower leg: No edema.     Left lower leg: No edema.  Lymphadenopathy:     Cervical: No cervical adenopathy.  Skin:    General: Skin is warm and dry.     Coloration: Skin is not jaundiced or pale.     Findings: No erythema or rash.  Neurological:     Mental Status: She is alert and oriented to person, place, and time. Mental status is at baseline.     Motor: No weakness.     Gait: Gait normal.  Psychiatric:        Mood and Affect: Mood normal.        Behavior: Behavior normal.        Thought Content: Thought content normal.        Judgment: Judgment normal.     Assessment/plan: Ann [name] is a 72 y.o. female present for [facility] Anxiety/sleep disturbance Stable Continue Xanax 0.25 mg daily daily as needed.  She rarely uses this medicine. She uses medications appropriately and understands risks of drug class. Continue Ambien at the 5 mg dose for her.  She understands 5 mg is the maximum recommended dose for those in her age bracket.  If for some reason her Medicare plan will not cover this we can consider GoodRx prescription since generic Ambien is rather inexpensive. Patient understands State regulations on controlled substances and [facility] policy on prescribing controlled substances.  Contract [place] controlled  substance database reviewed today 02/21/23 Patient aware a face-to-face follow-up will be needed if she would need refills after 6 months.  Patient aware she will need to request these scripts they will not be automatically refilled since they are controlled substances.  Hypertension/morbid obesity/A-fib:  Stable Continue Pradaxa twice daily> cardiology prescribes Continue metoprolol 50 mg twice daily Continue valsartan-HCTZ 40-25 daily Heart healthy diet recommended Statin declined She is established with cardio.  CBC, CMP, TSH and lipids collected today Follow-up 5.5 months.  DM w/ hyperlipidemia:  Diabetes managed by endocrine. Last A1c 8.6 Cholesterol has been elevated.  Heart healthy diet and routine exercise encouraged.  Medication management has been declined by patient for her lipids.  Diabetic eye exam up-to-date 05/01/2022 Foot exam completed today Pneumonia vac up-to-date   Gout  Uric acid collected today.  Patient is willing to retry allopurinol if needed - Colchicine was prescribed with instructions on proper use. - Patient was provided with low purine diet  Gait instability/knee pain: She has an appointment scheduled in about 1 month with her orthopedic to discuss her left knee pain.  She feels that she is weaker in her lower extremity and is causing instability in her gait.  She is concerned for falls. Referral to physical therapy placed to help with strengthening  Return in about 24 weeks (around 08/08/2023) for Routine chronic condition follow-up.  Orders Placed This Encounter  Procedures   T4, free   TSH   CBC   Comp Met (CMET)   Lipid panel   Uric acid   Ambulatory referral to Physical Therapy    Meds ordered this encounter  Medications   levothyroxine (SYNTHROID) 50 MCG tablet    Sig: Take 1 tablet (50 mcg total) by mouth daily before breakfast.    Dispense:  90 tablet    Refill:  3   metoprolol tartrate (LOPRESSOR) 50 MG tablet    Sig: Take 1  tablet (50 mg total) by mouth 2 (two) times daily.    Dispense:  180 tablet    Refill:  1   olmesartan-hydrochlorothiazide (BENICAR HCT) 40-25 MG tablet    Sig: Take 1 tablet by mouth daily.    Dispense:  90 tablet    Refill:  1   zolpidem (AMBIEN) 5 MG tablet    Sig: Take 1 tablet (5 mg total) by mouth at bedtime as needed.    Dispense:  30 tablet    Refill:  5   ALPRAZolam (XANAX) 0.25 MG tablet    Sig: Take 1 tablet (0.25 mg total) by mouth daily as needed for anxiety.    Dispense:  30 tablet    Refill:  5    Referral Orders         Ambulatory referral to Physical Therapy       Note is dictated utilizing voice recognition software. Although note has been proof read prior to signing, occasional typographical errors still can be missed. If any questions arise, please do not hesitate to call for verification.  Electronically signed by: Howard Pouch, DO [place]

## 2023-02-21 NOTE — Patient Instructions (Addendum)
Return in about 24 weeks (around 08/08/2023) for Routine chronic condition follow-up.        Great to see you today.  I have refilled the medication(s) we provide.   If labs were collected, we will inform you of lab results once received either by echart message or telephone call.   - echart message- for normal results that have been seen by the patient already.   - telephone call: abnormal results or if patient has not viewed results in their echart.

## 2023-02-22 ENCOUNTER — Telehealth: Payer: Self-pay | Admitting: Family Medicine

## 2023-02-22 NOTE — Telephone Encounter (Signed)
Spoke with patient regarding results/recommendations.  

## 2023-02-22 NOTE — Telephone Encounter (Signed)
Please call patient: Liver, thyroid and kidney functions are normal. Blood cell counts are all stable. Cholesterol panel is above goal for her.  LDL 139, her goal should be less than 70 with her history of diabetes.  Since she has side effects to the oral medications, I would encourage her to attempt to obtain exercise daily that she can tolerate.  Avoid consumption of fatty meats, such as bacon and sausage and red meats.  Avoid butters, olive oil is best.  Uric acid is above normal at 8.4, normal is less than 7.  Recommend she hydrate well, avoid dietary triggers by consuming a low purine diet (which was provided to her on her AVS at her visit prior) and if she desires we can restart the allopurinol or she can see if she has any additional flares just by closely monitoring her diet and hydrating.  If she elects to restart allopurinol we will call in the 100 mg dose.

## 2023-02-23 ENCOUNTER — Telehealth: Payer: Self-pay | Admitting: Family Medicine

## 2023-02-23 DIAGNOSIS — F419 Anxiety disorder, unspecified: Secondary | ICD-10-CM

## 2023-02-23 NOTE — Telephone Encounter (Signed)
Patient called and states that the Alprazolam and Zolpidem need to be sent to the [facility] in [facility]. Please give the patient a call when this has been sent.

## 2023-02-23 NOTE — Telephone Encounter (Addendum)
Both medications were sent to mail order pharmacy on 02/21/23. Provider is not in office to complete this currently. Pt was informed.    Please resend both medications if appropriate.

## 2023-02-26 MED ORDER — ALPRAZOLAM 0.25 MG PO TABS: 0.2500 mg | ORAL_TABLET | Freq: Every day | ORAL | 5 refills | Status: DC | PRN

## 2023-02-26 MED ORDER — ZOLPIDEM TARTRATE 5 MG PO TABS: 5.0000 mg | ORAL_TABLET | Freq: Every evening | ORAL | 5 refills | Status: DC | PRN

## 2023-02-26 NOTE — Telephone Encounter (Signed)
Spoke with patient regarding results/recommendations.  

## 2023-02-26 NOTE — Telephone Encounter (Signed)
Please inform pt x[facility]ax and Ambien have [facility]   Please call the mail in[facility] and cancel those two scripts at that location. thanks

## 2023-03-01 DIAGNOSIS — R2681 Unsteadiness on feet: Secondary | ICD-10-CM | POA: Diagnosis not present

## 2023-03-01 DIAGNOSIS — I890 Lymphedema, not elsewhere classified: Secondary | ICD-10-CM | POA: Diagnosis not present

## 2023-03-01 DIAGNOSIS — M25562 Pain in left knee: Secondary | ICD-10-CM | POA: Diagnosis not present

## 2023-03-01 DIAGNOSIS — M25552 Pain in left hip: Secondary | ICD-10-CM | POA: Diagnosis not present

## 2023-03-02 ENCOUNTER — Telehealth: Payer: Self-pay

## 2023-03-02 NOTE — Telephone Encounter (Signed)
faxed

## 2023-03-02 NOTE — Telephone Encounter (Signed)
Signed and returned to [facility]ma work basket 

## 2023-03-02 NOTE — Telephone Encounter (Signed)
Received PT eval form. Will place on PCP desk for signature

## 2023-03-05 DIAGNOSIS — I890 Lymphedema, not elsewhere classified: Secondary | ICD-10-CM | POA: Diagnosis not present

## 2023-03-05 DIAGNOSIS — M25552 Pain in left hip: Secondary | ICD-10-CM | POA: Diagnosis not present

## 2023-03-05 DIAGNOSIS — M25562 Pain in left knee: Secondary | ICD-10-CM | POA: Diagnosis not present

## 2023-03-05 DIAGNOSIS — R2681 Unsteadiness on feet: Secondary | ICD-10-CM | POA: Diagnosis not present

## 2023-03-08 DIAGNOSIS — M25562 Pain in left knee: Secondary | ICD-10-CM | POA: Diagnosis not present

## 2023-03-08 DIAGNOSIS — M25552 Pain in left hip: Secondary | ICD-10-CM | POA: Diagnosis not present

## 2023-03-08 DIAGNOSIS — I890 Lymphedema, not elsewhere classified: Secondary | ICD-10-CM | POA: Diagnosis not present

## 2023-03-08 DIAGNOSIS — R2681 Unsteadiness on feet: Secondary | ICD-10-CM | POA: Diagnosis not present

## 2023-03-12 ENCOUNTER — Telehealth: Payer: Self-pay | Admitting: Family Medicine

## 2023-03-12 NOTE — Telephone Encounter (Signed)
Pt is wanting to restart a prescription for allopurinol. She states that she has restarted taking it from the original script back in December. She states that she would like to start taking it again and ask that a 90 day supply get sent to her Pharmacy which is [facility].

## 2023-03-13 DIAGNOSIS — M25552 Pain in left hip: Secondary | ICD-10-CM | POA: Diagnosis not present

## 2023-03-13 DIAGNOSIS — M25562 Pain in left knee: Secondary | ICD-10-CM | POA: Diagnosis not present

## 2023-03-13 DIAGNOSIS — I890 Lymphedema, not elsewhere classified: Secondary | ICD-10-CM | POA: Diagnosis not present

## 2023-03-13 DIAGNOSIS — R2681 Unsteadiness on feet: Secondary | ICD-10-CM | POA: Diagnosis not present

## 2023-03-13 MED ORDER — ALLOPURINOL 100 MG PO TABS: 100.0000 mg | ORAL_TABLET | Freq: Every day | ORAL | 3 refills | Status: DC

## 2023-03-13 NOTE — Telephone Encounter (Signed)
Refilled allopurinol. Please make pt aware

## 2023-03-15 DIAGNOSIS — M25552 Pain in left hip: Secondary | ICD-10-CM | POA: Diagnosis not present

## 2023-03-15 DIAGNOSIS — R2681 Unsteadiness on feet: Secondary | ICD-10-CM | POA: Diagnosis not present

## 2023-03-15 DIAGNOSIS — M25562 Pain in left knee: Secondary | ICD-10-CM | POA: Diagnosis not present

## 2023-03-15 DIAGNOSIS — I890 Lymphedema, not elsewhere classified: Secondary | ICD-10-CM | POA: Diagnosis not present

## 2023-03-20 DIAGNOSIS — I890 Lymphedema, not elsewhere classified: Secondary | ICD-10-CM | POA: Diagnosis not present

## 2023-03-20 DIAGNOSIS — M25552 Pain in left hip: Secondary | ICD-10-CM | POA: Diagnosis not present

## 2023-03-20 DIAGNOSIS — M25562 Pain in left knee: Secondary | ICD-10-CM | POA: Diagnosis not present

## 2023-03-20 DIAGNOSIS — R2681 Unsteadiness on feet: Secondary | ICD-10-CM | POA: Diagnosis not present

## 2023-03-21 ENCOUNTER — Telehealth: Payer: Self-pay

## 2023-03-21 NOTE — Telephone Encounter (Signed)
Received PT reevaluation form. Placed on PCP desk for signature

## 2023-03-22 DIAGNOSIS — M170 Bilateral primary osteoarthritis of knee: Secondary | ICD-10-CM | POA: Diagnosis not present

## 2023-03-22 NOTE — Telephone Encounter (Signed)
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

## 2023-03-22 NOTE — Telephone Encounter (Signed)
Faxed

## 2023-03-23 DIAGNOSIS — M25562 Pain in left knee: Secondary | ICD-10-CM | POA: Diagnosis not present

## 2023-03-23 DIAGNOSIS — I890 Lymphedema, not elsewhere classified: Secondary | ICD-10-CM | POA: Diagnosis not present

## 2023-03-23 DIAGNOSIS — M25552 Pain in left hip: Secondary | ICD-10-CM | POA: Diagnosis not present

## 2023-03-23 DIAGNOSIS — R2681 Unsteadiness on feet: Secondary | ICD-10-CM | POA: Diagnosis not present

## 2023-03-27 DIAGNOSIS — R2681 Unsteadiness on feet: Secondary | ICD-10-CM | POA: Diagnosis not present

## 2023-03-27 DIAGNOSIS — M25552 Pain in left hip: Secondary | ICD-10-CM | POA: Diagnosis not present

## 2023-03-27 DIAGNOSIS — M25562 Pain in left knee: Secondary | ICD-10-CM | POA: Diagnosis not present

## 2023-03-27 DIAGNOSIS — I890 Lymphedema, not elsewhere classified: Secondary | ICD-10-CM | POA: Diagnosis not present

## 2023-03-29 DIAGNOSIS — M25562 Pain in left knee: Secondary | ICD-10-CM | POA: Diagnosis not present

## 2023-03-29 DIAGNOSIS — R2681 Unsteadiness on feet: Secondary | ICD-10-CM | POA: Diagnosis not present

## 2023-03-29 DIAGNOSIS — M25552 Pain in left hip: Secondary | ICD-10-CM | POA: Diagnosis not present

## 2023-03-29 DIAGNOSIS — I890 Lymphedema, not elsewhere classified: Secondary | ICD-10-CM | POA: Diagnosis not present

## 2023-04-04 ENCOUNTER — Ambulatory Visit (INDEPENDENT_AMBULATORY_CARE_PROVIDER_SITE_OTHER): Payer: Medicare Other | Admitting: Family Medicine

## 2023-04-04 ENCOUNTER — Encounter: Payer: Self-pay | Admitting: Family Medicine

## 2023-04-04 VITALS — BP 113/72 | HR 75 | Temp 99.0°F | Wt 288.6 lb

## 2023-04-04 DIAGNOSIS — L03115 Cellulitis of right lower limb: Secondary | ICD-10-CM

## 2023-04-04 DIAGNOSIS — B880 Other acariasis: Secondary | ICD-10-CM

## 2023-04-04 MED ORDER — CEPHALEXIN 500 MG PO CAPS: 500.0000 mg | ORAL_CAPSULE | Freq: Three times a day (TID) | ORAL | 0 refills | Status: DC

## 2023-04-04 NOTE — Progress Notes (Signed)
Ann [name] , November 26, 1951, 72 y.o., female MRN: [id number] Patient Care Team    Relationship Specialty Notifications Start End  Natalia Leatherwood, DO PCP - General Family Medicine  04/20/20   Yates Decamp, MD Consulting Physician Cardiology  04/20/20   Cherlyn Roberts, MD Referring Physician Dermatology  04/20/20   Jill Side, OD Referring Physician   04/20/20   Derrill Memo, MD Referring Physician Orthotics  04/20/20   Frankey Poot, MD Referring Physician Obstetrics and Gynecology  04/20/20   Konrad Penta, MD Referring Physician Gastroenterology  04/20/20   Shamleffer, Konrad Dolores, MD Attending Physician Endocrinology  01/31/23     Chief Complaint  Patient presents with   Rash    Right leg; last thursday     Subjective: Ann [name] is a 71 y.o. Pt presents for an OV with complaints of rash/insect bites on right leg of 1 week duration.  Associated symptoms include itching at the time of onset.  She has been cleansing using antibacterial agent and keeping covered.  Patient is an uncontrolled diabetic. She reports she feels the swelling has improved since onset.  Fever or chills not reported.  There is a small area on her right lower leg that is now seeping.     07/05/2022    9:55 AM 02/14/2022   10:59 AM 09/12/2021   11:08 AM 06/22/2021    9:58 AM 03/18/2021   11:08 AM  Depression screen PHQ 2/9  Decreased Interest 0 0 0 0 0  Down, Depressed, Hopeless 0 0 0 0 0  PHQ - 2 Score 0 0 0 0 0  Altered sleeping  3 3    Tired, decreased energy  0 0    Change in appetite  0 2    Feeling bad or failure about yourself   0 0    Trouble concentrating  0 0    Moving slowly or fidgety/restless  0 0    Suicidal thoughts  0 0    PHQ-9 Score  3 5      Allergies  Allergen Reactions   Amoxicillin Shortness Of Breath, Swelling and Anaphylaxis    Has patient had a PCN reaction causing immediate rash, facial/tongue/throat swelling, SOB or lightheadedness with hypotension:  Yes Has patient had a PCN reaction causing severe rash involving mucus membranes or skin necrosis: No Has patient had a PCN reaction that required hospitalization No Has patient had a PCN reaction occurring within the last 10 years: Yes If all of the above answers are "NO", then may proceed with Cephalosporin use.  Has patient had a PCN reaction causing immediate rash, facial/tongue/throat swelling, SOB or lightheadedness with hypotension: Yes Has patient had a PCN reaction causing severe rash involving mucus membranes or skin necrosis: No Has patient had a PCN reaction that required hospitalization No Has patient had a PCN reaction occurring within the last 10 years: Yes If all of the above answers are "NO", then may proceed with Cephalosporin use. Has patient had a PCN reaction causing immediate rash, facial/tongue/throat swelling, SOB or lightheadedness with hypotension: Yes Has patient had a PCN reaction causing severe rash involving mucus membranes or skin necrosis: No Has patient had a PCN reaction that required hospitalization No Has patient had a PCN reaction occurring within the last 10 years: Yes If all of the above answers are "NO", then may proceed with Cephalosporin use.    Codeine Itching    Tolerates hydrocodone   Ezetimibe  Livalo [Pitavastatin]    Statins Other (See Comments)    Muscle skeletal side effects   Welchol [Colesevelam]     Gi se   Social History   Social History Narrative   Marital status/children/pets: Single. G0P0.   Education/employment: Retired Charity fundraiser:      -smoke alarm in the home:Yes     - wears seatbelt: Yes     - Feels safe in their relationships: Yes   Past Medical History:  Diagnosis Date   Arthritis    osteoarthritis- knees.   Atrial fibrillation    Dr. Maisie Fus status post cardioversion chronic anticoagulation   Cellulitis of right lower extremity 12/30/2020   Chicken pox    Colon cancer 2001    Radiation, chemo.  Adenocarcinoma of the colon.  Followed by Dr. Kennith Center   Complication of anesthesia    sensitive to narcotics"extra sleepy". Versed- "no memory recall for 4 days postop"   Diabetes mellitus without complication    oral and Insulin   Hepatitis    "sub clinical case of Hepatitis in late 70's""has immunities"   Hypertension    Hypothyroidism    Laceration of head 08/04/2019   8 cm laceration forehead/scalp after tripping when mowing her lawn.   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   Ovarian teratoma, right 09/2000   Oophorectomy   Sleep apnea    unable to tolreate cpap   Wisdom teeth extracted    Past Surgical History:  Procedure Laterality Date   ABDOMINAL HYSTERECTOMY  09/2000    Total hysterectomy and oophorectomy   ABDOMINAL WOUND DEHISCENCE Bilateral    multiple times / mesh use with multiple abdominal hernia repair   CARDIOVERSION  2014   Unsuccessful.  Patient reports cardiac ablation was not felt to be appropriate next step by cardiology.   CHOLECYSTECTOMY  04/1998   '98 -Lapraroscopic   COLON RESECTION  2001   "colon cancer"   FOOT SURGERY Bilateral    multiple "hammer toes and bunionectomies"   KNEE ARTHROSCOPY Left 08/2006   meniscectomy   MELANOMA EXCISION     near scapula   TOTAL KNEE ARTHROPLASTY Right 01/24/2016   Procedure: RIGHT TOTAL KNEE ARTHROPLASTY;  Surgeon: Ollen Gross, MD;  Location: [facility];  Service: Orthopedics;  Laterality: Right;   Family History  Problem Relation Age of Onset   Arthritis Mother    Colon cancer Mother    Diabetes Mother    Atrial fibrillation Mother    Diabetes Father    Heart attack Father    Heart disease Father    Stroke Father    Hypertension Maternal Grandmother    Stroke Maternal Grandmother    Stroke Maternal Grandfather    Hypertension Paternal Grandmother    Stroke Paternal Grandmother     Diabetes Paternal Grandfather    Hypertension Paternal Grandfather    Stroke Paternal Grandfather    Diabetes Brother    Brain cancer Brother    Heart disease Brother    Hypertension Brother    Stroke Brother    Allergies as of 04/04/2023       Reactions   Amoxicillin Shortness Of Breath, Swelling, Anaphylaxis   Has patient had a PCN reaction causing immediate rash, facial/tongue/throat swelling, SOB or lightheadedness with hypotension: Yes Has patient had a PCN reaction causing severe rash involving mucus membranes or skin  necrosis: No Has patient had a PCN reaction that required hospitalization No Has patient had a PCN reaction occurring within the last 10 years: Yes If all of the above answers are "NO", then may proceed with Cephalosporin use. Has patient had a PCN reaction causing immediate rash, facial/tongue/throat swelling, SOB or lightheadedness with hypotension: Yes Has patient had a PCN reaction causing severe rash involving mucus membranes or skin necrosis: No Has patient had a PCN reaction that required hospitalization No Has patient had a PCN reaction occurring within the last 10 years: Yes If all of the above answers are "NO", then may proceed with Cephalosporin use. Has patient had a PCN reaction causing immediate rash, facial/tongue/throat swelling, SOB or lightheadedness with hypotension: Yes Has patient had a PCN reaction causing severe rash involving mucus membranes or skin necrosis: No Has patient had a PCN reaction that required hospitalization No Has patient had a PCN reaction occurring within the last 10 years: Yes If all of the above answers are "NO", then may proceed with Cephalosporin use.   Codeine Itching   Tolerates hydrocodone   Ezetimibe    Livalo [pitavastatin]    Statins Other (See Comments)   Muscle skeletal side effects   Welchol [colesevelam]    Gi se        Medication List        Accurate as of April 04, 2023 12:48 PM. If you have any  questions, ask your nurse or doctor.          STOP taking these medications    Dexcom G7 Receiver Devi Stopped by: Felix Pacini, DO   Dexcom G7 Sensor Misc Stopped by: Felix Pacini, DO       TAKE these medications    allopurinol 100 MG tablet Commonly known as: ZYLOPRIM Take 1 tablet (100 mg total) by mouth daily.   ALPRAZolam 0.25 MG tablet Commonly known as: XANAX Take 1 tablet (0.25 mg total) by mouth daily as needed for anxiety.   Basaglar KwikPen 100 UNIT/ML Inject 46 Units into the skin every morning. What changed: how much to take   cephALEXin 500 MG capsule Commonly known as: KEFLEX Take 1 capsule (500 mg total) by mouth 3 (three) times daily. Started by: Felix Pacini, DO   colchicine 0.6 MG tablet 2 tabs at onset of flare, followed by 1 tab 2 hours later. Then continue 1 tab daily until flare is resolved.   dabigatran 150 MG Caps capsule Commonly known as: PRADAXA Take 1 capsule (150 mg total) by mouth 2 (two) times daily.   Dexcom G6 Transmitter Misc USE AS DIRECTED   EpiPen 2-Pak 0.3 mg/0.3 mL Soaj injection Generic drug: EPINEPHrine Inject 0.3 mg into the skin as needed. Bee stings   Fiasp FlexTouch 100 UNIT/ML FlexTouch Pen Generic drug: insulin aspart Max daily 60 units   levothyroxine 50 MCG tablet Commonly known as: SYNTHROID Take 1 tablet (50 mcg total) by mouth daily before breakfast.   metoprolol tartrate 50 MG tablet Commonly known as: LOPRESSOR Take 1 tablet (50 mg total) by mouth 2 (two) times daily.   Novofine Pen Needle 32G X 6 MM Misc Generic drug: Insulin Pen Needle Inject 1 Device into the skin in the morning, at noon, in the evening, and at bedtime. Use as instructed to inject insulin   olmesartan-hydrochlorothiazide 40-25 MG tablet Commonly known as: BENICAR HCT Take 1 tablet by mouth daily.   OneTouch Delica Plus Lancet33G Misc Apply 1 each topically 3 (three) times daily.  OneTouch Ultra test strip Generic drug:  glucose blood SMARTSIG:Via Meter   triamcinolone ointment 0.5 % Commonly known as: KENALOG Apply topically 2 (two) times daily as needed.   zolpidem 5 MG tablet Commonly known as: AMBIEN Take 1 tablet (5 mg total) by mouth at bedtime as needed.        All past medical history, surgical history, allergies, family history, immunizations andmedications were updated in the EMR today and reviewed under the history and medication portions of their EMR.     ROS Negative, with the exception of above mentioned in HPI   Objective:  BP 113/72   Pulse 75   Temp 99 F (37.2 C)   Wt 288 lb 9.6 oz (130.9 kg)   SpO2 99%   BMI 43.88 kg/m  Body mass index is 43.88 kg/m. Physical Exam Vitals and nursing note reviewed.  Constitutional:      General: She is not in acute distress.    Appearance: Normal appearance. She is normal weight. She is not ill-appearing or toxic-appearing.  HENT:     Head: Normocephalic and atraumatic.  Eyes:     General: No scleral icterus.       Right eye: No discharge.        Left eye: No discharge.     Extraocular Movements: Extraocular movements intact.     Conjunctiva/sclera: Conjunctivae normal.     Pupils: Pupils are equal, round, and reactive to light.  Musculoskeletal:     Right lower leg: Swelling present.     Left lower leg: Normal.       Legs:     Comments: Right lower extremity: Multiple excoriated healing insect bites over anterior shin.  Mild swelling present.  Approximately 1 cm area of excoriation/skin break with mild serous drainage present.  Mild erythema surrounding this area.  Neurovascular intact distally  Skin:    Findings: Rash present.  Neurological:     Mental Status: She is alert and oriented to person, place, and time. Mental status is at baseline.     Motor: No weakness.     Coordination: Coordination normal.     Gait: Gait normal.  Psychiatric:        Mood and Affect: Mood normal.        Behavior: Behavior normal.         Thought Content: Thought content normal.        Judgment: Judgment normal.     No results found. No results found. No results found for this or any previous visit (from the past 24 hour(s)).  Assessment/Plan: KEANNA TUGWELL is a 71 y.o. female present for OV for  Chigger bites/Cellulitis of right lower extremity Exam is consistent with chigger bites of 1 week duration that has become mildly infected.  There is a area of cellulitis around excoriated skin lesion. No purulent drainage or abscess. With her history of uncontrolled diabetes we will need to monitor closely for full resolution of infection. We discussed wound care referral today.  However patient has been retired from the medical field and is competent to monitor condition, with daily dressing changes and gentle cleansing.  She understands if there is any worsening symptoms or symptoms are not improving over the next 1 to 2 weeks she is to follow-up at that time and we would place a referral to wound care at that time.  She is agreeable with this plan. Keflex 3 times daily x 7 days Follow-up 1 to 2 weeks if any  infection signs remain  Reviewed expectations re: course of current medical issues. Discussed self-management of symptoms. Outlined signs and symptoms indicating need for more acute intervention. Patient verbalized understanding and all questions were answered. Patient received an After-Visit Summary.    No orders of the defined types were placed in this encounter.  Meds ordered this encounter  Medications   cephALEXin (KEFLEX) 500 MG capsule    Sig: Take 1 capsule (500 mg total) by mouth 3 (three) times daily.    Dispense:  21 capsule    Refill:  0   Referral Orders  No referral(s) requested today     Note is dictated utilizing voice recognition software. Although note has been proof read prior to signing, occasional typographical errors still can be missed. If any questions arise, please do not hesitate  to call for verification.   electronically signed by:  Felix Pacini, DO  [facility] - OR

## 2023-04-04 NOTE — Patient Instructions (Addendum)
No follow-ups on file.  Monitor leg closely for worsening infection and follow up asap if this occurs and we will get you to wound clinic.       Great to see you today.  I have refilled the medication(s) we provide.   If labs were collected, we will inform you of lab results once received either by echart message or telephone call.   - echart message- for normal results that have been seen by the patient already.   - telephone call: abnormal results or if patient has not viewed results in their echart.

## 2023-04-05 DIAGNOSIS — M25562 Pain in left knee: Secondary | ICD-10-CM | POA: Diagnosis not present

## 2023-04-05 DIAGNOSIS — R2681 Unsteadiness on feet: Secondary | ICD-10-CM | POA: Diagnosis not present

## 2023-04-05 DIAGNOSIS — M25552 Pain in left hip: Secondary | ICD-10-CM | POA: Diagnosis not present

## 2023-04-05 DIAGNOSIS — I890 Lymphedema, not elsewhere classified: Secondary | ICD-10-CM | POA: Diagnosis not present

## 2023-04-10 DIAGNOSIS — M25552 Pain in left hip: Secondary | ICD-10-CM | POA: Diagnosis not present

## 2023-04-10 DIAGNOSIS — M25562 Pain in left knee: Secondary | ICD-10-CM | POA: Diagnosis not present

## 2023-04-10 DIAGNOSIS — R2681 Unsteadiness on feet: Secondary | ICD-10-CM | POA: Diagnosis not present

## 2023-04-10 DIAGNOSIS — I890 Lymphedema, not elsewhere classified: Secondary | ICD-10-CM | POA: Diagnosis not present

## 2023-04-12 DIAGNOSIS — M1712 Unilateral primary osteoarthritis, left knee: Secondary | ICD-10-CM | POA: Diagnosis not present

## 2023-04-16 DIAGNOSIS — M25562 Pain in left knee: Secondary | ICD-10-CM | POA: Diagnosis not present

## 2023-04-16 DIAGNOSIS — M25552 Pain in left hip: Secondary | ICD-10-CM | POA: Diagnosis not present

## 2023-04-16 DIAGNOSIS — I890 Lymphedema, not elsewhere classified: Secondary | ICD-10-CM | POA: Diagnosis not present

## 2023-04-16 DIAGNOSIS — R2681 Unsteadiness on feet: Secondary | ICD-10-CM | POA: Diagnosis not present

## 2023-04-18 DIAGNOSIS — M25552 Pain in left hip: Secondary | ICD-10-CM | POA: Diagnosis not present

## 2023-04-18 DIAGNOSIS — R2681 Unsteadiness on feet: Secondary | ICD-10-CM | POA: Diagnosis not present

## 2023-04-18 DIAGNOSIS — I890 Lymphedema, not elsewhere classified: Secondary | ICD-10-CM | POA: Diagnosis not present

## 2023-04-18 DIAGNOSIS — M25562 Pain in left knee: Secondary | ICD-10-CM | POA: Diagnosis not present

## 2023-04-19 DIAGNOSIS — M1712 Unilateral primary osteoarthritis, left knee: Secondary | ICD-10-CM | POA: Diagnosis not present

## 2023-04-24 DIAGNOSIS — R2681 Unsteadiness on feet: Secondary | ICD-10-CM | POA: Diagnosis not present

## 2023-04-24 DIAGNOSIS — M25562 Pain in left knee: Secondary | ICD-10-CM | POA: Diagnosis not present

## 2023-04-24 DIAGNOSIS — M25552 Pain in left hip: Secondary | ICD-10-CM | POA: Diagnosis not present

## 2023-04-24 DIAGNOSIS — I890 Lymphedema, not elsewhere classified: Secondary | ICD-10-CM | POA: Diagnosis not present

## 2023-04-25 DIAGNOSIS — Z1330 Encounter for screening examination for mental health and behavioral disorders, unspecified: Secondary | ICD-10-CM | POA: Diagnosis not present

## 2023-04-25 DIAGNOSIS — N763 Subacute and chronic vulvitis: Secondary | ICD-10-CM | POA: Diagnosis not present

## 2023-04-25 DIAGNOSIS — L900 Lichen sclerosus et atrophicus: Secondary | ICD-10-CM | POA: Diagnosis not present

## 2023-04-26 DIAGNOSIS — M1712 Unilateral primary osteoarthritis, left knee: Secondary | ICD-10-CM | POA: Diagnosis not present

## 2023-05-01 DIAGNOSIS — I890 Lymphedema, not elsewhere classified: Secondary | ICD-10-CM | POA: Diagnosis not present

## 2023-05-01 DIAGNOSIS — R2681 Unsteadiness on feet: Secondary | ICD-10-CM | POA: Diagnosis not present

## 2023-05-01 DIAGNOSIS — M25552 Pain in left hip: Secondary | ICD-10-CM | POA: Diagnosis not present

## 2023-05-01 DIAGNOSIS — M25562 Pain in left knee: Secondary | ICD-10-CM | POA: Diagnosis not present

## 2023-05-02 DIAGNOSIS — M25562 Pain in left knee: Secondary | ICD-10-CM | POA: Diagnosis not present

## 2023-05-02 DIAGNOSIS — I890 Lymphedema, not elsewhere classified: Secondary | ICD-10-CM | POA: Diagnosis not present

## 2023-05-02 DIAGNOSIS — R2681 Unsteadiness on feet: Secondary | ICD-10-CM | POA: Diagnosis not present

## 2023-05-02 DIAGNOSIS — M25552 Pain in left hip: Secondary | ICD-10-CM | POA: Diagnosis not present

## 2023-05-03 ENCOUNTER — Telehealth: Payer: Self-pay

## 2023-05-03 NOTE — Telephone Encounter (Signed)
Signed and returned to CMA work basket 

## 2023-05-03 NOTE — Telephone Encounter (Signed)
Faxed

## 2023-05-03 NOTE — Telephone Encounter (Signed)
Received PT eval form. Placed on PCP desk for signature 

## 2023-05-09 DIAGNOSIS — I890 Lymphedema, not elsewhere classified: Secondary | ICD-10-CM | POA: Diagnosis not present

## 2023-05-09 DIAGNOSIS — M25562 Pain in left knee: Secondary | ICD-10-CM | POA: Diagnosis not present

## 2023-05-09 DIAGNOSIS — R2681 Unsteadiness on feet: Secondary | ICD-10-CM | POA: Diagnosis not present

## 2023-05-09 DIAGNOSIS — M25552 Pain in left hip: Secondary | ICD-10-CM | POA: Diagnosis not present

## 2023-05-10 ENCOUNTER — Other Ambulatory Visit: Payer: Self-pay

## 2023-05-10 ENCOUNTER — Other Ambulatory Visit: Payer: Self-pay | Admitting: Internal Medicine

## 2023-05-10 DIAGNOSIS — M25562 Pain in left knee: Secondary | ICD-10-CM | POA: Diagnosis not present

## 2023-05-10 DIAGNOSIS — M25552 Pain in left hip: Secondary | ICD-10-CM | POA: Diagnosis not present

## 2023-05-10 DIAGNOSIS — R2681 Unsteadiness on feet: Secondary | ICD-10-CM | POA: Diagnosis not present

## 2023-05-10 DIAGNOSIS — I890 Lymphedema, not elsewhere classified: Secondary | ICD-10-CM | POA: Diagnosis not present

## 2023-05-10 MED ORDER — DEXCOM G6 SENSOR MISC: 3 refills | Status: DC

## 2023-05-11 ENCOUNTER — Other Ambulatory Visit: Payer: Self-pay

## 2023-05-11 MED ORDER — DEXCOM G6 SENSOR MISC: 3 refills | Status: DC

## 2023-05-14 DIAGNOSIS — M25552 Pain in left hip: Secondary | ICD-10-CM | POA: Diagnosis not present

## 2023-05-14 DIAGNOSIS — R2681 Unsteadiness on feet: Secondary | ICD-10-CM | POA: Diagnosis not present

## 2023-05-14 DIAGNOSIS — M25562 Pain in left knee: Secondary | ICD-10-CM | POA: Diagnosis not present

## 2023-05-14 DIAGNOSIS — I890 Lymphedema, not elsewhere classified: Secondary | ICD-10-CM | POA: Diagnosis not present

## 2023-05-16 DIAGNOSIS — Z86010 Personal history of colonic polyps: Secondary | ICD-10-CM | POA: Diagnosis not present

## 2023-05-16 DIAGNOSIS — R1031 Right lower quadrant pain: Secondary | ICD-10-CM | POA: Diagnosis not present

## 2023-05-16 DIAGNOSIS — R194 Change in bowel habit: Secondary | ICD-10-CM | POA: Diagnosis not present

## 2023-05-17 DIAGNOSIS — M25552 Pain in left hip: Secondary | ICD-10-CM | POA: Diagnosis not present

## 2023-05-17 DIAGNOSIS — I890 Lymphedema, not elsewhere classified: Secondary | ICD-10-CM | POA: Diagnosis not present

## 2023-05-17 DIAGNOSIS — R2681 Unsteadiness on feet: Secondary | ICD-10-CM | POA: Diagnosis not present

## 2023-05-17 DIAGNOSIS — M25562 Pain in left knee: Secondary | ICD-10-CM | POA: Diagnosis not present

## 2023-05-21 ENCOUNTER — Encounter: Payer: Self-pay | Admitting: Family Medicine

## 2023-05-21 ENCOUNTER — Ambulatory Visit (INDEPENDENT_AMBULATORY_CARE_PROVIDER_SITE_OTHER): Payer: Medicare Other | Admitting: Family Medicine

## 2023-05-21 VITALS — BP 138/88 | HR 79 | Temp 97.4°F | Wt 284.0 lb

## 2023-05-21 DIAGNOSIS — W57XXXA Bitten or stung by nonvenomous insect and other nonvenomous arthropods, initial encounter: Secondary | ICD-10-CM

## 2023-05-21 DIAGNOSIS — S80869A Insect bite (nonvenomous), unspecified lower leg, initial encounter: Secondary | ICD-10-CM

## 2023-05-21 MED ORDER — CEPHALEXIN 500 MG PO CAPS: 500.0000 mg | ORAL_CAPSULE | Freq: Three times a day (TID) | ORAL | 0 refills | Status: DC

## 2023-05-21 NOTE — Progress Notes (Signed)
Ann [name] , 10/18/1951, 72 y.o., female MRN: [id number] Patient Care Team    Relationship Specialty Notifications Start End  Natalia Leatherwood, DO PCP - General Family Medicine  04/20/20   Yates Decamp, MD Consulting Physician Cardiology  04/20/20   Cherlyn Roberts, MD Referring Physician Dermatology  04/20/20   Jill Side, OD Referring Physician   04/20/20   Derrill Memo, MD Referring Physician Orthotics  04/20/20   Frankey Poot, MD Referring Physician Obstetrics and Gynecology  04/20/20   Konrad Penta, MD Referring Physician Gastroenterology  04/20/20   Shamleffer, Konrad Dolores, MD Attending Physician Endocrinology  01/31/23     Chief Complaint  Patient presents with   Insect Bite    Had bumps on right leg, ran a fever of 101 on 6/7 and then noticed bumps on left leg. Antibiotic prescribed before seemed to work.      Subjective: Ann [name] is a 72 y.o. Pt presents for an OV with complaints of lower 101 F 3 days ago with chills.  She had no other symptoms to indicate a respiratory illness or GI illness.  Denies change in bowel habits, other than what she has been seeing her GI concerning of.  Denies dysuria or urinary frequency.  She does have some mild swelling of her left lower extremity with redness and a rash.  She states this is itchy.  Currently afebrile. She did have mild cellulitis of her right lower extremity approximately 2 months ago.  Responded well to Keflex.     04/04/2023    3:51 PM 07/05/2022    9:55 AM 02/14/2022   10:59 AM 09/12/2021   11:08 AM 06/22/2021    9:58 AM  Depression screen PHQ 2/9  Decreased Interest 0 0 0 0 0  Down, Depressed, Hopeless 0 0 0 0 0  PHQ - 2 Score 0 0 0 0 0  Altered sleeping   3 3   Tired, decreased energy   0 0   Change in appetite   0 2   Feeling bad or failure about yourself    0 0   Trouble concentrating   0 0   Moving slowly or fidgety/restless   0 0   Suicidal thoughts   0 0   PHQ-9 Score   3 5      Allergies  Allergen Reactions   Amoxicillin Shortness Of Breath, Swelling and Anaphylaxis    Has patient had a PCN reaction causing immediate rash, facial/tongue/throat swelling, SOB or lightheadedness with hypotension: Yes Has patient had a PCN reaction causing severe rash involving mucus membranes or skin necrosis: No Has patient had a PCN reaction that required hospitalization No Has patient had a PCN reaction occurring within the last 10 years: Yes If all of the above answers are "NO", then may proceed with Cephalosporin use.  Has patient had a PCN reaction causing immediate rash, facial/tongue/throat swelling, SOB or lightheadedness with hypotension: Yes Has patient had a PCN reaction causing severe rash involving mucus membranes or skin necrosis: No Has patient had a PCN reaction that required hospitalization No Has patient had a PCN reaction occurring within the last 10 years: Yes If all of the above answers are "NO", then may proceed with Cephalosporin use. Has patient had a PCN reaction causing immediate rash, facial/tongue/throat swelling, SOB or lightheadedness with hypotension: Yes Has patient had a PCN reaction causing severe rash involving mucus membranes or skin necrosis: No Has patient had  a PCN reaction that required hospitalization No Has patient had a PCN reaction occurring within the last 10 years: Yes If all of the above answers are "NO", then may proceed with Cephalosporin use.    Codeine Itching    Tolerates hydrocodone   Ezetimibe    Livalo [Pitavastatin]    Statins Other (See Comments)    Muscle skeletal side effects   Welchol [Colesevelam]     Gi se   Social History   Social History Narrative   Marital status/children/pets: Single. G0P0.   Education/employment: Retired Charity fundraiser:      -smoke alarm in the home:Yes     - wears seatbelt: Yes     - Feels safe in their relationships: Yes   Past Medical History:   Diagnosis Date   Arthritis    osteoarthritis- knees.   Atrial fibrillation (HCC)    Dr. Maisie Fus status post cardioversion chronic anticoagulation   Cellulitis of right lower extremity 12/30/2020   Chicken pox    Colon cancer (HCC) 2001   Radiation, chemo.  Adenocarcinoma of the colon.  Followed by Dr. Kennith Center   Complication of anesthesia    sensitive to narcotics"extra sleepy". Versed- "no memory recall for 4 days postop"   Diabetes mellitus without complication (HCC)    oral and Insulin   Hepatitis    "sub clinical case of Hepatitis in late 70's""has immunities"   Hypertension    Hypothyroidism    Laceration of head 08/04/2019   8 cm laceration forehead/scalp after tripping when mowing her lawn.   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   Ovarian teratoma, right 09/2000   Oophorectomy   Sleep apnea    unable to tolreate cpap   Wisdom teeth extracted    Past Surgical History:  Procedure Laterality Date   ABDOMINAL HYSTERECTOMY  09/2000    Total hysterectomy and oophorectomy   ABDOMINAL WOUND DEHISCENCE Bilateral    multiple times / mesh use with multiple abdominal hernia repair   CARDIOVERSION  2014   Unsuccessful.  Patient reports cardiac ablation was not felt to be appropriate next step by cardiology.   CHOLECYSTECTOMY  04/1998   '98 -Lapraroscopic   COLON RESECTION  2001   "colon cancer"   FOOT SURGERY Bilateral    multiple "hammer toes and bunionectomies"   KNEE ARTHROSCOPY Left 08/2006   meniscectomy   MELANOMA EXCISION     near scapula   TOTAL KNEE ARTHROPLASTY Right 01/24/2016   Procedure: RIGHT TOTAL KNEE ARTHROPLASTY;  Surgeon: Ollen Gross, MD;  Location: [facility];  Service: Orthopedics;  Laterality: Right;   Family History  Problem Relation Age of Onset   Arthritis Mother    Colon cancer Mother    Diabetes Mother    Atrial fibrillation Mother     Diabetes Father    Heart attack Father    Heart disease Father    Stroke Father    Hypertension Maternal Grandmother    Stroke Maternal Grandmother    Stroke Maternal Grandfather    Hypertension Paternal Grandmother    Stroke Paternal Grandmother    Diabetes Paternal Grandfather    Hypertension Paternal Grandfather    Stroke Paternal Grandfather    Diabetes Brother    Brain cancer Brother    Heart disease Brother    Hypertension Brother    Stroke Brother  Allergies as of 05/21/2023       Reactions   Amoxicillin Shortness Of Breath, Swelling, Anaphylaxis   Has patient had a PCN reaction causing immediate rash, facial/tongue/throat swelling, SOB or lightheadedness with hypotension: Yes Has patient had a PCN reaction causing severe rash involving mucus membranes or skin necrosis: No Has patient had a PCN reaction that required hospitalization No Has patient had a PCN reaction occurring within the last 10 years: Yes If all of the above answers are "NO", then may proceed with Cephalosporin use. Has patient had a PCN reaction causing immediate rash, facial/tongue/throat swelling, SOB or lightheadedness with hypotension: Yes Has patient had a PCN reaction causing severe rash involving mucus membranes or skin necrosis: No Has patient had a PCN reaction that required hospitalization No Has patient had a PCN reaction occurring within the last 10 years: Yes If all of the above answers are "NO", then may proceed with Cephalosporin use. Has patient had a PCN reaction causing immediate rash, facial/tongue/throat swelling, SOB or lightheadedness with hypotension: Yes Has patient had a PCN reaction causing severe rash involving mucus membranes or skin necrosis: No Has patient had a PCN reaction that required hospitalization No Has patient had a PCN reaction occurring within the last 10 years: Yes If all of the above answers are "NO", then may proceed with Cephalosporin use.   Codeine Itching    Tolerates hydrocodone   Ezetimibe    Livalo [pitavastatin]    Statins Other (See Comments)   Muscle skeletal side effects   Welchol [colesevelam]    Gi se        Medication List        Accurate as of May 21, 2023  3:29 PM. If you have any questions, ask your nurse or doctor.          allopurinol 100 MG tablet Commonly known as: ZYLOPRIM Take 1 tablet (100 mg total) by mouth daily.   ALPRAZolam 0.25 MG tablet Commonly known as: XANAX Take 1 tablet (0.25 mg total) by mouth daily as needed for anxiety.   Basaglar KwikPen 100 UNIT/ML Inject 46 Units into the skin every morning. What changed: how much to take   cephALEXin 500 MG capsule Commonly known as: KEFLEX Take 1 capsule (500 mg total) by mouth 3 (three) times daily.   colchicine 0.6 MG tablet 2 tabs at onset of flare, followed by 1 tab 2 hours later. Then continue 1 tab daily until flare is resolved.   dabigatran 150 MG Caps capsule Commonly known as: PRADAXA Take 1 capsule (150 mg total) by mouth 2 (two) times daily.   Dexcom G6 Sensor Misc Change every 10 days  DX E11.65   Dexcom G6 Transmitter Misc USE AS DIRECTED   EpiPen 2-Pak 0.3 mg/0.3 mL Soaj injection Generic drug: EPINEPHrine Inject 0.3 mg into the skin as needed. Bee stings   Fiasp FlexTouch 100 UNIT/ML FlexTouch Pen Generic drug: insulin aspart Max daily 60 units   levothyroxine 50 MCG tablet Commonly known as: SYNTHROID Take 1 tablet (50 mcg total) by mouth daily before breakfast.   metoprolol tartrate 50 MG tablet Commonly known as: LOPRESSOR Take 1 tablet (50 mg total) by mouth 2 (two) times daily.   Novofine Pen Needle 32G X 6 MM Misc Generic drug: Insulin Pen Needle Inject 1 Device into the skin in the morning, at noon, in the evening, and at bedtime. Use as instructed to inject insulin   olmesartan-hydrochlorothiazide 40-25 MG tablet Commonly known as: BENICAR HCT  Take 1 tablet by mouth daily.   OneTouch Delica Plus  Lancet33G Misc Apply 1 each topically 3 (three) times daily.   OneTouch Ultra test strip Generic drug: glucose blood SMARTSIG:Via Meter   triamcinolone ointment 0.5 % Commonly known as: KENALOG Apply topically 2 (two) times daily as needed.   zolpidem 5 MG tablet Commonly known as: AMBIEN Take 1 tablet (5 mg total) by mouth at bedtime as needed.        All past medical history, surgical history, allergies, family history, immunizations andmedications were updated in the EMR today and reviewed under the history and medication portions of their EMR.     ROS Negative, with the exception of above mentioned in HPI   Objective:  BP 138/88   Pulse 79   Temp (!) 97.4 F (36.3 C)   Wt 284 lb (128.8 kg)   SpO2 94%   BMI 43.18 kg/m  Body mass index is 43.18 kg/m. Physical Exam Vitals and nursing note reviewed.  Constitutional:      General: She is not in acute distress.    Appearance: Normal appearance. She is normal weight. She is not ill-appearing or toxic-appearing.  HENT:     Head: Normocephalic and atraumatic.  Eyes:     General: No scleral icterus.       Right eye: No discharge.        Left eye: No discharge.     Extraocular Movements: Extraocular movements intact.     Conjunctiva/sclera: Conjunctivae normal.     Pupils: Pupils are equal, round, and reactive to light.  Musculoskeletal:     Right lower leg: Edema present.     Left lower leg: Edema present.     Comments: Mild edema bilaterally lower extremities.  Mild erythema and rash located left lower extremity.  Few areas with excoriation could have been insect bites.  Neurovascularly intact distally  Skin:    Findings: No rash.  Neurological:     Mental Status: She is alert and oriented to person, place, and time. Mental status is at baseline.     Motor: No weakness.     Coordination: Coordination normal.     Gait: Gait normal.  Psychiatric:        Mood and Affect: Mood normal.        Behavior: Behavior  normal.        Thought Content: Thought content normal.        Judgment: Judgment normal.     No results found. No results found. No results found for this or any previous visit (from the past 24 hour(s)).  Assessment/Plan: Ann [name] is a 72 y.o. female present for OV for  Cellulitis (early) With her history of uncontrolled diabetes we will need to monitor closely for full resolution of infection. Keflex 3 times daily x 7 days prescribed, worked well for her in the past for same condition. Follow-up 2-3 weeks if symptoms not resolved, sooner if worsening.  Reviewed expectations re: course of current medical issues. Discussed self-management of symptoms. Outlined signs and symptoms indicating need for more acute intervention. Patient verbalized understanding and all questions were answered. Patient received an After-Visit Summary.    No orders of the defined types were placed in this encounter.  Meds ordered this encounter  Medications   cephALEXin (KEFLEX) 500 MG capsule    Sig: Take 1 capsule (500 mg total) by mouth 3 (three) times daily.    Dispense:  21 capsule    Refill:  0   Referral Orders  No referral(s) requested today     Note is dictated utilizing voice recognition software. Although note has been proof read prior to signing, occasional typographical errors still can be missed. If any questions arise, please do not hesitate to call for verification.   electronically signed by:  Felix Pacini, DO  [facility] - [place]

## 2023-05-21 NOTE — Patient Instructions (Addendum)
Follow up in 2 weeks if not improved , sooner if worsening.          Great to see you today.  I have refilled the medication(s) we provide.   If labs were collected, we will inform you of lab results once received either by echart message or telephone call.   - echart message- for normal results that have been seen by the patient already.   - telephone call: abnormal results or if patient has not viewed results in their echart.

## 2023-05-28 DIAGNOSIS — R2681 Unsteadiness on feet: Secondary | ICD-10-CM | POA: Diagnosis not present

## 2023-05-28 DIAGNOSIS — M25562 Pain in left knee: Secondary | ICD-10-CM | POA: Diagnosis not present

## 2023-05-28 DIAGNOSIS — M25552 Pain in left hip: Secondary | ICD-10-CM | POA: Diagnosis not present

## 2023-05-28 DIAGNOSIS — I890 Lymphedema, not elsewhere classified: Secondary | ICD-10-CM | POA: Diagnosis not present

## 2023-05-30 DIAGNOSIS — H2513 Age-related nuclear cataract, bilateral: Secondary | ICD-10-CM | POA: Diagnosis not present

## 2023-05-30 DIAGNOSIS — E119 Type 2 diabetes mellitus without complications: Secondary | ICD-10-CM | POA: Diagnosis not present

## 2023-05-30 DIAGNOSIS — Z794 Long term (current) use of insulin: Secondary | ICD-10-CM | POA: Diagnosis not present

## 2023-05-30 LAB — HM DIABETES EYE EXAM

## 2023-06-01 ENCOUNTER — Encounter: Payer: Self-pay | Admitting: Cardiology

## 2023-06-04 DIAGNOSIS — I890 Lymphedema, not elsewhere classified: Secondary | ICD-10-CM | POA: Diagnosis not present

## 2023-06-04 DIAGNOSIS — M25552 Pain in left hip: Secondary | ICD-10-CM | POA: Diagnosis not present

## 2023-06-04 DIAGNOSIS — R2681 Unsteadiness on feet: Secondary | ICD-10-CM | POA: Diagnosis not present

## 2023-06-04 DIAGNOSIS — M25562 Pain in left knee: Secondary | ICD-10-CM | POA: Diagnosis not present

## 2023-06-13 DIAGNOSIS — I890 Lymphedema, not elsewhere classified: Secondary | ICD-10-CM | POA: Diagnosis not present

## 2023-06-13 DIAGNOSIS — M25552 Pain in left hip: Secondary | ICD-10-CM | POA: Diagnosis not present

## 2023-06-13 DIAGNOSIS — M25562 Pain in left knee: Secondary | ICD-10-CM | POA: Diagnosis not present

## 2023-06-13 DIAGNOSIS — R2681 Unsteadiness on feet: Secondary | ICD-10-CM | POA: Diagnosis not present

## 2023-06-20 DIAGNOSIS — R2681 Unsteadiness on feet: Secondary | ICD-10-CM | POA: Diagnosis not present

## 2023-06-20 DIAGNOSIS — M25562 Pain in left knee: Secondary | ICD-10-CM | POA: Diagnosis not present

## 2023-06-20 DIAGNOSIS — M25552 Pain in left hip: Secondary | ICD-10-CM | POA: Diagnosis not present

## 2023-06-20 DIAGNOSIS — I890 Lymphedema, not elsewhere classified: Secondary | ICD-10-CM | POA: Diagnosis not present

## 2023-06-22 ENCOUNTER — Telehealth: Payer: Self-pay | Admitting: Family Medicine

## 2023-06-22 ENCOUNTER — Other Ambulatory Visit: Payer: Self-pay

## 2023-06-22 MED ORDER — ALLOPURINOL 100 MG PO TABS: 100.0000 mg | ORAL_TABLET | Freq: Every day | ORAL | 0 refills | Status: DC

## 2023-06-22 NOTE — Telephone Encounter (Signed)
Rx sent to [facility] for 90 d/s. Rx was originally sent to [facility]. Pt has been notified

## 2023-06-22 NOTE — Telephone Encounter (Signed)
Patient is inquiring about her medication allopurinol. She would like to know why it is not being renewed? She wanted to know if it can be sent to the [facility] in [place]. She is asking for a call to let her know that the medication has been sent in to the [facility] in [place]   allopurinol (ZYLOPRIM) 100 MG tablet

## 2023-06-27 DIAGNOSIS — M25562 Pain in left knee: Secondary | ICD-10-CM | POA: Diagnosis not present

## 2023-06-27 DIAGNOSIS — I890 Lymphedema, not elsewhere classified: Secondary | ICD-10-CM | POA: Diagnosis not present

## 2023-06-27 DIAGNOSIS — M25552 Pain in left hip: Secondary | ICD-10-CM | POA: Diagnosis not present

## 2023-06-27 DIAGNOSIS — R2681 Unsteadiness on feet: Secondary | ICD-10-CM | POA: Diagnosis not present

## 2023-07-09 ENCOUNTER — Ambulatory Visit (INDEPENDENT_AMBULATORY_CARE_PROVIDER_SITE_OTHER): Payer: Medicare Other | Admitting: Family Medicine

## 2023-07-09 ENCOUNTER — Encounter: Payer: Self-pay | Admitting: Family Medicine

## 2023-07-09 VITALS — BP 122/80 | HR 93 | Temp 97.2°F | Wt 274.4 lb

## 2023-07-09 DIAGNOSIS — R197 Diarrhea, unspecified: Secondary | ICD-10-CM | POA: Insufficient documentation

## 2023-07-09 DIAGNOSIS — R413 Other amnesia: Secondary | ICD-10-CM

## 2023-07-09 DIAGNOSIS — R4789 Other speech disturbances: Secondary | ICD-10-CM

## 2023-07-09 DIAGNOSIS — E559 Vitamin D deficiency, unspecified: Secondary | ICD-10-CM

## 2023-07-09 DIAGNOSIS — Z823 Family history of stroke: Secondary | ICD-10-CM

## 2023-07-09 DIAGNOSIS — R634 Abnormal weight loss: Secondary | ICD-10-CM | POA: Diagnosis not present

## 2023-07-09 DIAGNOSIS — R4189 Other symptoms and signs involving cognitive functions and awareness: Secondary | ICD-10-CM | POA: Insufficient documentation

## 2023-07-09 HISTORY — DX: Other speech disturbances: R47.89

## 2023-07-09 HISTORY — DX: Diarrhea, unspecified: R19.7

## 2023-07-09 LAB — COMPREHENSIVE METABOLIC PANEL
ALT: 19 U/L (ref 0–35)
AST: 24 U/L (ref 0–37)
Albumin: 4.2 g/dL (ref 3.5–5.2)
Alkaline Phosphatase: 84 U/L (ref 39–117)
BUN: 32 mg/dL — ABNORMAL HIGH (ref 6–23)
CO2: 25 mEq/L (ref 19–32)
Calcium: 9.4 mg/dL (ref 8.4–10.5)
Chloride: 97 mEq/L (ref 96–112)
Creatinine, Ser: 1.08 mg/dL (ref 0.40–1.20)
GFR: 50.79 mL/min — ABNORMAL LOW (ref >60.00)
Glucose, Bld: 272 mg/dL — ABNORMAL HIGH (ref 70–99)
Potassium: 3.7 mEq/L (ref 3.5–5.1)
Sodium: 136 mEq/L (ref 135–145)
Total Bilirubin: 1.9 mg/dL — ABNORMAL HIGH (ref 0.2–1.2)
Total Protein: 6.9 g/dL (ref 6.0–8.3)

## 2023-07-09 LAB — CBC WITH DIFFERENTIAL/PLATELET
Basophils Absolute: 0.1 10*3/uL (ref 0.0–0.1)
Basophils Relative: 1.1 % (ref 0.0–3.0)
Eosinophils Absolute: 0.1 10*3/uL (ref 0.0–0.7)
Eosinophils Relative: 1.4 % (ref 0.0–5.0)
HCT: 45.4 % (ref 36.0–46.0)
Hemoglobin: 15 g/dL (ref 12.0–15.0)
Lymphocytes Relative: 12.3 % (ref 12.0–46.0)
Lymphs Abs: 0.9 10*3/uL (ref 0.7–4.0)
MCHC: 33 g/dL (ref 30.0–36.0)
MCV: 93.3 fl (ref 78.0–100.0)
Monocytes Absolute: 0.4 10*3/uL (ref 0.1–1.0)
Monocytes Relative: 5.2 % (ref 3.0–12.0)
Neutro Abs: 5.9 10*3/uL (ref 1.4–7.7)
Neutrophils Relative %: 80 % — ABNORMAL HIGH (ref 43.0–77.0)
Platelets: 147 10*3/uL — ABNORMAL LOW (ref 150.0–400.0)
RBC: 4.87 Mil/uL (ref 3.87–5.11)
RDW: 14.5 % (ref 11.5–15.5)
WBC: 7.4 10*3/uL (ref 4.0–10.5)

## 2023-07-09 LAB — B12 AND FOLATE PANEL
Folate: 9.7 ng/mL (ref >5.9)
Vitamin B-12: 161 pg/mL — ABNORMAL LOW (ref 211–911)

## 2023-07-09 LAB — T4, FREE: Free T4: 1.05 ng/dL (ref 0.60–1.60)

## 2023-07-09 LAB — TSH: TSH: 2.71 u[IU]/mL (ref 0.35–5.50)

## 2023-07-09 LAB — C-REACTIVE PROTEIN: CRP: 1.2 mg/dL (ref 0.5–20.0)

## 2023-07-09 LAB — VITAMIN D 25 HYDROXY (VIT D DEFICIENCY, FRACTURES): VITD: 14.8 ng/mL — ABNORMAL LOW (ref 30.00–100.00)

## 2023-07-09 NOTE — Progress Notes (Signed)
Ann [name] , 02-04-1951, 72 y.o., female MRN: [id number] Patient Care Team    Relationship Specialty Notifications Start End  Natalia Leatherwood, DO PCP - General Family Medicine  04/20/20   Yates Decamp, MD Consulting Physician Cardiology  04/20/20   Cherlyn Roberts, MD Referring Physician Dermatology  04/20/20   Jill Side, OD Referring Physician   04/20/20   Derrill Memo, MD Referring Physician Orthotics  04/20/20   Frankey Poot, MD Referring Physician Obstetrics and Gynecology  04/20/20   Konrad Penta, MD Referring Physician Gastroenterology  04/20/20   Shamleffer, Konrad Dolores, MD Attending Physician Endocrinology  01/31/23     Chief Complaint  Patient presents with   memory changes   bowel habit change     Subjective: Ann [name] is a 72 y.o. Pt presents for an OV with complaints of chronic diarrhea of over 6 mos duration.  Associated symptoms include Rlq pain.  She is established with digestive health specialist, was assigned to a new provider since Dr. Jason Fila had retired.  Patient reports they did not feel that her diarrhea and fecal urgency required more of a workup and scheduled her for her colonoscopy in September.  She denies fever, chills, nausea, vomiting.  She denies any blood per rectum.  She reports she has had no normal bowel movements for the last 6 months.  She is wondering if she has an adhesion from her mesh.  She has a lack of appetite.  She has lost 6 pounds unintentionally.  She also reports having abrupt onset memory changes and word finding difficulty.  She states she has noticed on 07/01/2023 she was having problems speaking.  She states she knew in her brain which she wanted to say but it was not coming out where she could not think of the word though it was supposed to be.  Since that event she has noticed she is sleeping more and keeping to herself.  She feels like she is lost track of time over the last week.  She spoke with her sister the day  before she was having word finding difficulty and her sister had noticed her speech was slower.  Patient reports compliance with Pradaxa.  She is concerned that she may have had a small stroke, since it runs in her family and her brother died of a stroke.  He denies any other neurological changes. She reports sugars have been high.  TSH normal 02/21/2023       07/09/2023   11:39 AM 04/04/2023    3:51 PM 07/05/2022    9:55 AM 02/14/2022   10:59 AM 09/12/2021   11:08 AM  Depression screen PHQ 2/9  Decreased Interest 0 0 0 0 0  Down, Depressed, Hopeless 0 0 0 0 0  PHQ - 2 Score 0 0 0 0 0  Altered sleeping    3 3  Tired, decreased energy    0 0  Change in appetite    0 2  Feeling bad or failure about yourself     0 0  Trouble concentrating    0 0  Moving slowly or fidgety/restless    0 0  Suicidal thoughts    0 0  PHQ-9 Score    3 5    Allergies  Allergen Reactions   Amoxicillin Shortness Of Breath, Swelling and Anaphylaxis    Has patient had a PCN reaction causing immediate rash, facial/tongue/throat swelling, SOB or lightheadedness with hypotension: Yes Has patient  had a PCN reaction causing severe rash involving mucus membranes or skin necrosis: No Has patient had a PCN reaction that required hospitalization No Has patient had a PCN reaction occurring within the last 10 years: Yes If all of the above answers are "NO", then may proceed with Cephalosporin use.  Has patient had a PCN reaction causing immediate rash, facial/tongue/throat swelling, SOB or lightheadedness with hypotension: Yes Has patient had a PCN reaction causing severe rash involving mucus membranes or skin necrosis: No Has patient had a PCN reaction that required hospitalization No Has patient had a PCN reaction occurring within the last 10 years: Yes If all of the above answers are "NO", then may proceed with Cephalosporin use. Has patient had a PCN reaction causing immediate rash, facial/tongue/throat swelling, SOB or  lightheadedness with hypotension: Yes Has patient had a PCN reaction causing severe rash involving mucus membranes or skin necrosis: No Has patient had a PCN reaction that required hospitalization No Has patient had a PCN reaction occurring within the last 10 years: Yes If all of the above answers are "NO", then may proceed with Cephalosporin use.    Codeine Itching    Tolerates hydrocodone   Ezetimibe    Livalo [Pitavastatin]    Statins Other (See Comments)    Muscle skeletal side effects   Welchol [Colesevelam]     Gi se   Social History   Social History Narrative   Marital status/children/pets: Single. G0P0.   Education/employment: Retired Charity fundraiser:      -smoke alarm in the home:Yes     - wears seatbelt: Yes     - Feels safe in their relationships: Yes   Past Medical History:  Diagnosis Date   Arthritis    osteoarthritis- knees.   Atrial fibrillation (HCC)    Dr. Maisie Fus status post cardioversion chronic anticoagulation   Cellulitis of right lower extremity 12/30/2020   Chicken pox    Colon cancer (HCC) 2001   Radiation, chemo.  Adenocarcinoma of the colon.  Followed by Dr. Kennith Center   Complication of anesthesia    sensitive to narcotics"extra sleepy". Versed- "no memory recall for 4 days postop"   Diabetes mellitus without complication (HCC)    oral and Insulin   Hepatitis    "sub clinical case of Hepatitis in late 70's""has immunities"   Hypertension    Hypothyroidism    Laceration of head 08/04/2019   8 cm laceration forehead/scalp after tripping when mowing her lawn.   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   Ovarian teratoma, right 09/2000   Oophorectomy   Sleep apnea    unable to tolreate cpap   Wisdom teeth extracted    Past Surgical History:  Procedure Laterality Date   ABDOMINAL HYSTERECTOMY  09/2000    Total  hysterectomy and oophorectomy   ABDOMINAL WOUND DEHISCENCE Bilateral    multiple times / mesh use with multiple abdominal hernia repair   CARDIOVERSION  2014   Unsuccessful.  Patient reports cardiac ablation was not felt to be appropriate next step by cardiology.   CHOLECYSTECTOMY  04/1998   '98 -Lapraroscopic   COLON RESECTION  2001   "colon cancer"   FOOT SURGERY Bilateral    multiple "hammer toes and bunionectomies"   KNEE ARTHROSCOPY Left 08/2006   meniscectomy   MELANOMA EXCISION     near scapula  TOTAL KNEE ARTHROPLASTY Right 01/24/2016   Procedure: RIGHT TOTAL KNEE ARTHROPLASTY;  Surgeon: Ollen Gross, MD;  Location: [facility];  Service: Orthopedics;  Laterality: Right;   Family History  Problem Relation Age of Onset   Arthritis Mother    Colon cancer Mother    Diabetes Mother    Atrial fibrillation Mother    Diabetes Father    Heart attack Father    Heart disease Father    Stroke Father    Hypertension Maternal Grandmother    Stroke Maternal Grandmother    Stroke Maternal Grandfather    Hypertension Paternal Grandmother    Stroke Paternal Grandmother    Diabetes Paternal Grandfather    Hypertension Paternal Grandfather    Stroke Paternal Grandfather    Diabetes Brother    Brain cancer Brother    Heart disease Brother    Hypertension Brother    Stroke Brother    Allergies as of 07/09/2023       Reactions   Amoxicillin Shortness Of Breath, Swelling, Anaphylaxis   Has patient had a PCN reaction causing immediate rash, facial/tongue/throat swelling, SOB or lightheadedness with hypotension: Yes Has patient had a PCN reaction causing severe rash involving mucus membranes or skin necrosis: No Has patient had a PCN reaction that required hospitalization No Has patient had a PCN reaction occurring within the last 10 years: Yes If all of the above answers are "NO", then may proceed with Cephalosporin use. Has patient had a PCN reaction causing immediate rash,  facial/tongue/throat swelling, SOB or lightheadedness with hypotension: Yes Has patient had a PCN reaction causing severe rash involving mucus membranes or skin necrosis: No Has patient had a PCN reaction that required hospitalization No Has patient had a PCN reaction occurring within the last 10 years: Yes If all of the above answers are "NO", then may proceed with Cephalosporin use. Has patient had a PCN reaction causing immediate rash, facial/tongue/throat swelling, SOB or lightheadedness with hypotension: Yes Has patient had a PCN reaction causing severe rash involving mucus membranes or skin necrosis: No Has patient had a PCN reaction that required hospitalization No Has patient had a PCN reaction occurring within the last 10 years: Yes If all of the above answers are "NO", then may proceed with Cephalosporin use.   Codeine Itching   Tolerates hydrocodone   Ezetimibe    Livalo [pitavastatin]    Statins Other (See Comments)   Muscle skeletal side effects   Welchol [colesevelam]    Gi se        Medication List        Accurate as of July 09, 2023 12:05 PM. If you have any questions, ask your nurse or doctor.          STOP taking these medications    cephALEXin 500 MG capsule Commonly known as: KEFLEX Stopped by: Felix Pacini       TAKE these medications    allopurinol 100 MG tablet Commonly known as: ZYLOPRIM Take 1 tablet (100 mg total) by mouth daily.   ALPRAZolam 0.25 MG tablet Commonly known as: XANAX Take 1 tablet (0.25 mg total) by mouth daily as needed for anxiety.   Basaglar KwikPen 100 UNIT/ML Inject 46 Units into the skin every morning. What changed: how much to take   colchicine 0.6 MG tablet 2 tabs at onset of flare, followed by 1 tab 2 hours later. Then continue 1 tab daily until flare is resolved.   dabigatran 150 MG Caps capsule Commonly known as:  PRADAXA Take 1 capsule (150 mg total) by mouth 2 (two) times daily.   Dexcom G6 Sensor  Misc Change every 10 days  DX E11.65   Dexcom G6 Transmitter Misc USE AS DIRECTED   EpiPen 2-Pak 0.3 MG/0.3ML Soaj injection Generic drug: EPINEPHrine Inject 0.3 mg into the skin as needed. Bee stings   Fiasp FlexTouch 100 UNIT/ML FlexTouch Pen Generic drug: insulin aspart Max daily 60 units   levothyroxine 50 MCG tablet Commonly known as: SYNTHROID Take 1 tablet (50 mcg total) by mouth daily before breakfast.   metoprolol tartrate 50 MG tablet Commonly known as: LOPRESSOR Take 1 tablet (50 mg total) by mouth 2 (two) times daily.   Novofine Pen Needle 32G X 6 MM Misc Generic drug: Insulin Pen Needle Inject 1 Device into the skin in the morning, at noon, in the evening, and at bedtime. Use as instructed to inject insulin   olmesartan-hydrochlorothiazide 40-25 MG tablet Commonly known as: BENICAR HCT Take 1 tablet by mouth daily.   OneTouch Delica Plus Lancet33G Misc Apply 1 each topically 3 (three) times daily.   OneTouch Ultra test strip Generic drug: glucose blood SMARTSIG:Via Meter   triamcinolone ointment 0.5 % Commonly known as: KENALOG Apply topically 2 (two) times daily as needed.   zolpidem 5 MG tablet Commonly known as: AMBIEN Take 1 tablet (5 mg total) by mouth at bedtime as needed.        All past medical history, surgical history, allergies, family history, immunizations andmedications were updated in the EMR today and reviewed under the history and medication portions of their EMR.     ROS Negative, with the exception of above mentioned in HPI   Objective:  BP 122/80   Pulse 93   Temp (!) 97.2 F (36.2 C)   Wt 274 lb 6.4 oz (124.5 kg)   SpO2 96%   BMI 41.72 kg/m  Body mass index is 41.72 kg/m. Physical Exam Vitals and nursing note reviewed.  Constitutional:      General: She is not in acute distress.    Appearance: Normal appearance. She is not ill-appearing, toxic-appearing or diaphoretic.  HENT:     Head: Normocephalic and  atraumatic.  Eyes:     General: No scleral icterus.       Right eye: No discharge.        Left eye: No discharge.     Extraocular Movements: Extraocular movements intact.     Conjunctiva/sclera: Conjunctivae normal.     Pupils: Pupils are equal, round, and reactive to light.  Cardiovascular:     Rate and Rhythm: Normal rate and regular rhythm.     Heart sounds: No murmur heard. Pulmonary:     Effort: Pulmonary effort is normal. No respiratory distress.     Breath sounds: Normal breath sounds. No wheezing, rhonchi or rales.  Abdominal:     Palpations: Abdomen is soft.     Tenderness: There is abdominal tenderness (RLQ).  Musculoskeletal:     Cervical back: Neck supple.     Right lower leg: No edema.     Left lower leg: No edema.  Skin:    General: Skin is warm.     Findings: No rash.  Neurological:     Mental Status: She is alert and oriented to person, place, and time.     Motor: No weakness.     Coordination: Coordination normal.     Gait: Gait normal.  Psychiatric:        Attention and  Perception: Attention normal.        Mood and Affect: Mood normal. Affect is flat.        Speech: Speech is delayed.        Behavior: Behavior is slowed and withdrawn.        Thought Content: Thought content normal. Thought content is not paranoid or delusional. Thought content does not include homicidal or suicidal ideation.        Cognition and Memory: She exhibits impaired recent memory.        Judgment: Judgment normal.     Comments: Struggling with word finding      No results found. No results found. No results found for this or any previous visit (from the past 24 hour(s)).  Assessment/Plan: Ann [name] is a 72 y.o. female present for OV for  Memory changes/Word finding difficulty/family history stroke Exam is concerning with word finding difficulty, with abrupt onset 1 week ago today.  Strong family history of stroke.  Patient is a diabetic, with hypertension. He does  report compliance with her Pradaxa.  Statin intolerant.  Has declined other medications available for lipid control. - TSH - Vitamin D (25 hydroxy) - B12 and Folate Panel - Thyroid peroxidase antibody - T4, free - Comp Met (CMET) - CBC w/Diff - Vitamin B1 - MR BRAIN W WO CONTRAST; Future  Diarrhea, unspecified type/Unintentional weight loss Diarrhea has been greater than 6 months.  Patient feels the pain in her right lower quadrant is not improving. She does have colonoscopy scheduled in September. - TSH - Vitamin D (25 hydroxy) - Thyroid peroxidase antibody - T4, free - Comp Met (CMET) - CBC w/Diff - C-reactive protein - Lipase Once labs are completed, consider abdominal CT with contrast.  50 min spent with patient covering 2 new urgent problems, requiring extensive lab work up and imaging  Reviewed expectations re: course of current medical issues. Discussed self-management of symptoms. Outlined signs and symptoms indicating need for more acute intervention. Patient verbalized understanding and all questions were answered. Patient received an After-Visit Summary.    Orders Placed This Encounter  Procedures   MR BRAIN W WO CONTRAST   TSH   Vitamin D (25 hydroxy)   B12 and Folate Panel   Thyroid peroxidase antibody   T4, free   Comp Met (CMET)   CBC w/Diff   Vitamin B1   C-reactive protein   Lipase   No orders of the defined types were placed in this encounter.  Referral Orders  No referral(s) requested today     Note is dictated utilizing voice recognition software. Although note has been proof read prior to signing, occasional typographical errors still can be missed. If any questions arise, please do not hesitate to call for verification.   electronically signed by:  Felix Pacini, DO  [facility] - [place]

## 2023-07-09 NOTE — Patient Instructions (Addendum)
Only one  NEW problem can be evaluated per acute appointment slot.  No follow-ups on file.        Great to see you today.  I have refilled the medication(s) we provide.   If labs were collected or images ordered, we will inform you of  results once we have received them and reviewed. We will contact you either by echart message, or telephone call.  Please give ample time to the testing facility, and our office to run,  receive and review results. Please do not call inquiring of results, even if you can see them in your chart. We will contact you as soon as we are able. If it has been over 1 week since the test was completed, and you have not yet heard from [place], then please call us.    - echart message- for normal results that have been seen by the patient already.   - telephone call: abnormal results or if patient has not viewed results in their echart.  If a referral to a specialist was entered for you, please call us in 2 weeks if you have not heard from the specialist office to schedule.

## 2023-07-10 ENCOUNTER — Telehealth: Payer: Self-pay | Admitting: Family Medicine

## 2023-07-10 DIAGNOSIS — E538 Deficiency of other specified B group vitamins: Secondary | ICD-10-CM

## 2023-07-10 DIAGNOSIS — R197 Diarrhea, unspecified: Secondary | ICD-10-CM

## 2023-07-10 DIAGNOSIS — R634 Abnormal weight loss: Secondary | ICD-10-CM

## 2023-07-10 DIAGNOSIS — R1031 Right lower quadrant pain: Secondary | ICD-10-CM

## 2023-07-10 DIAGNOSIS — E559 Vitamin D deficiency, unspecified: Secondary | ICD-10-CM

## 2023-07-10 MED ORDER — VITAMIN D (ERGOCALCIFEROL) 1.25 MG (50000 UNIT) PO CAPS: ORAL_CAPSULE | ORAL | 0 refills | Status: DC

## 2023-07-10 NOTE — Telephone Encounter (Signed)
LM for pt to return call to discuss.  

## 2023-07-10 NOTE — Telephone Encounter (Signed)
Spoke with patient regarding results/recommendations.  

## 2023-07-10 NOTE — Telephone Encounter (Signed)
Patient aware of results and recommendations. °

## 2023-07-10 NOTE — Telephone Encounter (Signed)
Please call patient Her blood cell count is normal, thyroid function is normal.  The inflammatory marker is also normal. She appeared mildly dehydrated on her labs and her kidney function is reflective of mildly decreased secondary to that.  Would encourage her to increase her hydration.  She had a mildly elevated bilirubin of 1.9.  Uncertain what to make of this for now unless it was caused by a fasting state or something she ate prior to lab.  We will need to revisit this after we get the image of her abdomen that I also ordered today.  Her vitamin D and B12 are both severely deficient.  Both of which can cause issues with brain function and cause fatigue. -I have called in a high-dose vitamin D supplement that is taken twice a week for 12 weeks.  This was called into her local [facility] since it is not a long-term dose. -B12 injections need to be started immediately once a week for 4 weeks, then injection every 2 weeks for 4 doses.   -Her B12 and vitamin D are extremely low.  She needs to get on the nurse schedule immediately for B12 start.   We will call her also once we get the brain image results.  And they should be calling her to schedule her abdominal image.  -Please have her schedule provider appointment follow-up for both of these conditions in 2 weeks.   Please make sure the MRI brain is getting schedule stat

## 2023-07-11 ENCOUNTER — Encounter (INDEPENDENT_AMBULATORY_CARE_PROVIDER_SITE_OTHER): Payer: Self-pay

## 2023-07-11 ENCOUNTER — Ambulatory Visit (INDEPENDENT_AMBULATORY_CARE_PROVIDER_SITE_OTHER): Payer: Medicare Other

## 2023-07-11 VITALS — Wt 274.0 lb

## 2023-07-11 DIAGNOSIS — Z0000 Encounter for general adult medical examination without abnormal findings: Secondary | ICD-10-CM | POA: Diagnosis not present

## 2023-07-11 NOTE — Patient Instructions (Signed)
[name] , Thank you for taking time to come for your Medicare Wellness Visit. I appreciate your ongoing commitment to your health goals. Please review the following plan we discussed and let me know if I can assist you in the future.   Referrals/Orders/Follow-Ups/Clinician Recommendations: get to feeling better   This is a list of the screening recommended for you and due dates:  Health Maintenance  Topic Date Due   Colon Cancer Screening  09/07/2023   Flu Shot  07/12/2023   Hemoglobin A1C  07/16/2023   Yearly kidney health urinalysis for diabetes  08/17/2023   Complete foot exam   08/26/2023   Eye exam for diabetics  05/29/2024   Yearly kidney function blood test for diabetes  07/08/2024   Medicare Annual Wellness Visit  07/10/2024   Mammogram  08/31/2024   DTaP/Tdap/Td vaccine (2 - Td or Tdap) 08/03/2029   Pneumonia Vaccine  Completed   DEXA scan (bone density measurement)  Completed   HPV Vaccine  Aged Out   COVID-19 Vaccine  Discontinued   Hepatitis C Screening  Discontinued   Zoster (Shingles) Vaccine  Discontinued    Advanced directives: (Copy Requested) Please bring a copy of your health care power of attorney and living will to the office to be added to your chart at your convenience.  Next Medicare Annual Wellness Visit scheduled for next year: Yes  Preventive Care 74 Years and Older, Female Preventive care refers to lifestyle choices and visits with your health care provider that can promote health and wellness. What does preventive care include? A yearly physical exam. This is also called an annual well check. Dental exams once or twice a year. Routine eye exams. Ask your health care provider how often you should have your eyes checked. Personal lifestyle choices, including: Daily care of your teeth and gums. Regular physical activity. Eating a healthy diet. Avoiding tobacco and drug use. Limiting alcohol use. Practicing safe sex. Taking low-dose aspirin  every day. Taking vitamin and mineral supplements as recommended by your health care provider. What happens during an annual well check? The services and screenings done by your health care provider during your annual well check will depend on your age, overall health, lifestyle risk factors, and family history of disease. Counseling  Your health care provider may ask you questions about your: Alcohol use. Tobacco use. Drug use. Emotional well-being. Home and relationship well-being. Sexual activity. Eating habits. History of falls. Memory and ability to understand (cognition). Work and work Astronomer. Reproductive health. Screening  You may have the following tests or measurements: Height, weight, and BMI. Blood pressure. Lipid and cholesterol levels. These may be checked every 5 years, or more frequently if you are over 74 years old. Skin check. Lung cancer screening. You may have this screening every year starting at age 74 if you have a 30-pack-year history of smoking and currently smoke or have quit within the past 15 years. Fecal occult blood test (FOBT) of the stool. You may have this test every year starting at age 74. Flexible sigmoidoscopy or colonoscopy. You may have a sigmoidoscopy every 5 years or a colonoscopy every 10 years starting at age 74. Hepatitis C blood test. Hepatitis B blood test. Sexually transmitted disease (STD) testing. Diabetes screening. This is done by checking your blood sugar (glucose) after you have not eaten for a while (fasting). You may have this done every 1-3 years. Bone density scan. This is done to screen for osteoporosis. You may have this  done starting at age 35. Mammogram. This may be done every 74-2 years. Talk to your health care provider about how often you should have regular mammograms. Talk with your health care provider about your test results, treatment options, and if necessary, the need for more tests. Vaccines  Your health  care provider may recommend certain vaccines, such as: Influenza vaccine. This is recommended every year. Tetanus, diphtheria, and acellular pertussis (Tdap, Td) vaccine. You may need a Td booster every 10 years. Zoster vaccine. You may need this after age 20. Pneumococcal 13-valent conjugate (PCV13) vaccine. One dose is recommended after age 2. Pneumococcal polysaccharide (PPSV23) vaccine. One dose is recommended after age 29. Talk to your health care provider about which screenings and vaccines you need and how often you need them. This information is not intended to replace advice given to you by your health care provider. Make sure you discuss any questions you have with your health care provider. Document Released: 12/24/2015 Document Revised: 08/16/2016 Document Reviewed: 09/28/2015 Elsevier Interactive Patient Education  2017 [employer].  Fall Prevention in the Home Falls can cause injuries. They can happen to people of all ages. There are many things you can do to make your home safe and to help prevent falls. What can I do on the outside of my home? Regularly fix the edges of walkways and driveways and fix any cracks. Remove anything that might make you trip as you walk through a door, such as a raised step or threshold. Trim any bushes or trees on the path to your home. Use bright outdoor lighting. Clear any walking paths of anything that might make someone trip, such as rocks or tools. Regularly check to see if handrails are loose or broken. Make sure that both sides of any steps have handrails. Any raised decks and porches should have guardrails on the edges. Have any leaves, snow, or ice cleared regularly. Use sand or salt on walking paths during winter. Clean up any spills in your garage right away. This includes oil or grease spills. What can I do in the bathroom? Use night lights. Install grab bars by the toilet and in the tub and shower. Do not use towel bars as grab  bars. Use non-skid mats or decals in the tub or shower. If you need to sit down in the shower, use a plastic, non-slip stool. Keep the floor dry. Clean up any water that spills on the floor as soon as it happens. Remove soap buildup in the tub or shower regularly. Attach bath mats securely with double-sided non-slip rug tape. Do not have throw rugs and other things on the floor that can make you trip. What can I do in the bedroom? Use night lights. Make sure that you have a light by your bed that is easy to reach. Do not use any sheets or blankets that are too big for your bed. They should not hang down onto the floor. Have a firm chair that has side arms. You can use this for support while you get dressed. Do not have throw rugs and other things on the floor that can make you trip. What can I do in the kitchen? Clean up any spills right away. Avoid walking on wet floors. Keep items that you use a lot in easy-to-reach places. If you need to reach something above you, use a strong step stool that has a grab bar. Keep electrical cords out of the way. Do not use floor polish or wax  that makes floors slippery. If you must use wax, use non-skid floor wax. Do not have throw rugs and other things on the floor that can make you trip. What can I do with my stairs? Do not leave any items on the stairs. Make sure that there are handrails on both sides of the stairs and use them. Fix handrails that are broken or loose. Make sure that handrails are as long as the stairways. Check any carpeting to make sure that it is firmly attached to the stairs. Fix any carpet that is loose or worn. Avoid having throw rugs at the top or bottom of the stairs. If you do have throw rugs, attach them to the floor with carpet tape. Make sure that you have a light switch at the top of the stairs and the bottom of the stairs. If you do not have them, ask someone to add them for you. What else can I do to help prevent  falls? Wear shoes that: Do not have high heels. Have rubber bottoms. Are comfortable and fit you well. Are closed at the toe. Do not wear sandals. If you use a stepladder: Make sure that it is fully opened. Do not climb a closed stepladder. Make sure that both sides of the stepladder are locked into place. Ask someone to hold it for you, if possible. Clearly mark and make sure that you can see: Any grab bars or handrails. First and last steps. Where the edge of each step is. Use tools that help you move around (mobility aids) if they are needed. These include: Canes. Walkers. Scooters. Crutches. Turn on the lights when you go into a dark area. Replace any light bulbs as soon as they burn out. Set up your furniture so you have a clear path. Avoid moving your furniture around. If any of your floors are uneven, fix them. If there are any pets around you, be aware of where they are. Review your medicines with your doctor. Some medicines can make you feel dizzy. This can increase your chance of falling. Ask your doctor what other things that you can do to help prevent falls. This information is not intended to replace advice given to you by your health care provider. Make sure you discuss any questions you have with your health care provider. Document Released: 09/23/2009 Document Revised: 05/04/2016 Document Reviewed: 01/01/2015 Elsevier Interactive Patient Education  2017 [employer].

## 2023-07-11 NOTE — Progress Notes (Signed)
Subjective:   Ann [name] is a 72 y.o. female who presents for Medicare Annual (Subsequent) preventive examination.  Vital Signs: Unable to obtain new vitals due to this being a telehealth visit.   Visit Complete: Virtual  I connected with  Ann [name] on 07/11/23 by a audio enabled telemedicine application and verified that I am speaking with the correct person using two identifiers.  Patient Location: Home  Provider Location: Home Office  I discussed the limitations of evaluation and management by telemedicine. The patient expressed understanding and agreed to proceed.   Review of Systems     Cardiac Risk Factors include: advanced age (>85men, >70 women);hypertension;dyslipidemia;diabetes mellitus;obesity (BMI >30kg/m2)     Objective:    Today's Vitals   07/11/23 0906  Weight: 274 lb (124.3 kg)   Body mass index is 41.66 kg/m.     07/11/2023    9:12 AM 07/05/2022    9:56 AM 06/22/2021    9:55 AM 01/24/2016   12:30 PM 01/24/2016    7:34 AM 01/18/2016   12:48 PM  Advanced Directives  Does Patient Have a Medical Advance Directive? Yes Yes Yes Yes  Yes  Type of Estate agent of [place];Living will Healthcare Power of [employer] of [place];Living will Healthcare Power of [employer] Power of [employer] Power of Attorney  Does patient want to make changes to medical advance directive?    No - Patient declined  No - Patient declined  Copy of Healthcare Power of Attorney in Chart? No - copy requested No - copy requested No - copy requested Yes -- No - copy requested    Current Medications (verified) Outpatient Encounter Medications as of 07/11/2023  Medication Sig   allopurinol (ZYLOPRIM) 100 MG tablet Take 1 tablet (100 mg total) by mouth daily.   ALPRAZolam (XANAX) 0.25 MG tablet Take 1 tablet (0.25 mg total) by mouth daily as needed for anxiety.   colchicine 0.6 MG tablet 2 tabs at onset of flare, followed  by 1 tab 2 hours later. Then continue 1 tab daily until flare is resolved.   Continuous Blood Gluc Transmit (DEXCOM G6 TRANSMITTER) MISC USE AS DIRECTED   Continuous Glucose Sensor (DEXCOM G6 SENSOR) MISC Change every 10 days  DX E11.65   dabigatran (PRADAXA) 150 MG CAPS capsule Take 1 capsule (150 mg total) by mouth 2 (two) times daily.   EPIPEN 2-PAK 0.3 MG/0.3ML SOAJ injection Inject 0.3 mg into the skin as needed. Bee stings   insulin aspart (FIASP FLEXTOUCH) 100 UNIT/ML FlexTouch Pen Max daily 60 units   Insulin Glargine (BASAGLAR KWIKPEN) 100 UNIT/ML Inject 46 Units into the skin every morning. (Patient taking differently: Inject 50 Units into the skin every morning.)   Insulin Pen Needle (NOVOFINE PEN NEEDLE) 32G X 6 MM MISC Inject 1 Device into the skin in the morning, at noon, in the evening, and at bedtime. Use as instructed to inject insulin   Lancets (ONETOUCH DELICA PLUS LANCET33G) MISC Apply 1 each topically 3 (three) times daily.   levothyroxine (SYNTHROID) 50 MCG tablet Take 1 tablet (50 mcg total) by mouth daily before breakfast.   metoprolol tartrate (LOPRESSOR) 50 MG tablet Take 1 tablet (50 mg total) by mouth 2 (two) times daily.   olmesartan-hydrochlorothiazide (BENICAR HCT) 40-25 MG tablet Take 1 tablet by mouth daily.   ONETOUCH ULTRA test strip SMARTSIG:Via Meter   triamcinolone ointment (KENALOG) 0.5 % Apply topically 2 (two) times daily as needed.   Vitamin  D, Ergocalciferol, (DRISDOL) 1.25 MG (50000 UNIT) CAPS capsule 1 cap PO Monday and Thursday with food   zolpidem (AMBIEN) 5 MG tablet Take 1 tablet (5 mg total) by mouth at bedtime as needed.   No facility-administered encounter medications on file as of 07/11/2023.    Allergies (verified) Amoxicillin, Ampicillin, Codeine, Ezetimibe, Livalo [pitavastatin], Statins, and Welchol [colesevelam]   History: Past Medical History:  Diagnosis Date   Arthritis    osteoarthritis- knees.   Atrial fibrillation (HCC)    Dr.  Maisie Fus status post cardioversion chronic anticoagulation   Cellulitis of right lower extremity 12/30/2020   Chicken pox    Colon cancer (HCC) 2001   Radiation, chemo.  Adenocarcinoma of the colon.  Followed by Dr. Kennith Center   Complication of anesthesia    sensitive to narcotics"extra sleepy". Versed- "no memory recall for 4 days postop"   Diabetes mellitus without complication (HCC)    oral and Insulin   Hepatitis    "sub clinical case of Hepatitis in late 70's""has immunities"   Hypertension    Hypothyroidism    Laceration of head 08/04/2019   8 cm laceration forehead/scalp after tripping when mowing her lawn.   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   Ovarian teratoma, right 09/2000   Oophorectomy   Sleep apnea    unable to tolreate cpap   Wisdom teeth extracted    Past Surgical History:  Procedure Laterality Date   ABDOMINAL HYSTERECTOMY  09/2000    Total hysterectomy and oophorectomy   ABDOMINAL WOUND DEHISCENCE Bilateral    multiple times / mesh use with multiple abdominal hernia repair   CARDIOVERSION  2014   Unsuccessful.  Patient reports cardiac ablation was not felt to be appropriate next step by cardiology.   CHOLECYSTECTOMY  04/1998   '98 -Lapraroscopic   COLON RESECTION  2001   "colon cancer"   FOOT SURGERY Bilateral    multiple "hammer toes and bunionectomies"   KNEE ARTHROSCOPY Left 08/2006   meniscectomy   MELANOMA EXCISION     near scapula   TOTAL KNEE ARTHROPLASTY Right 01/24/2016   Procedure: RIGHT TOTAL KNEE ARTHROPLASTY;  Surgeon: Ollen Gross, MD;  Location: [facility];  Service: Orthopedics;  Laterality: Right;   Family History  Problem Relation Age of Onset   Arthritis Mother    Colon cancer Mother    Diabetes Mother    Atrial fibrillation Mother    Diabetes Father    Heart attack Father    Heart disease Father    Stroke Father     Hypertension Maternal Grandmother    Stroke Maternal Grandmother    Stroke Maternal Grandfather    Hypertension Paternal Grandmother    Stroke Paternal Grandmother    Diabetes Paternal Grandfather    Hypertension Paternal Grandfather    Stroke Paternal Grandfather    Diabetes Brother    Brain cancer Brother    Heart disease Brother    Hypertension Brother    Stroke Brother    Social History   Socioeconomic History   Marital status: Single    Spouse name: Not on file   Number of children: 0   Years of education: Not on file   Highest education level: Not on file  Occupational History   Not on file  Tobacco Use   Smoking status: Former    Current packs/day: 0.00  Types: Cigarettes    Start date: 01/18/1972    Quit date: 01/17/1997    Years since quitting: 26.4   Smokeless tobacco: Never  Vaping Use   Vaping status: Never Used  Substance and Sexual Activity   Alcohol use: No   Drug use: No   Sexual activity: Not Currently  Other Topics Concern   Not on file  Social History Narrative   Marital status/children/pets: Single. G0P0.   Education/employment: Retired Charity fundraiser:      -smoke alarm in the home:Yes     - wears seatbelt: Yes     - Feels safe in their relationships: Yes   Social Determinants of Health   Financial Resource Strain: Low Risk  (07/11/2023)   Overall Financial Resource Strain (CARDIA)    Difficulty of Paying Living Expenses: Not hard at all  Food Insecurity: No Food Insecurity (07/11/2023)   Hunger Vital Sign    Worried About Running Out of Food in the Last Year: Never true    Ran Out of Food in the Last Year: Never true  Transportation Needs: No Transportation Needs (07/11/2023)   PRAPARE - Administrator, Civil Service (Medical): No    Lack of Transportation (Non-Medical): No  Physical Activity: Inactive (07/11/2023)   Exercise Vital Sign    Days of Exercise per Week: 0 days    Minutes of Exercise  per Session: 0 min  Stress: No Stress Concern Present (07/11/2023)   [employer] of Occupational Health - Occupational Stress Questionnaire    Feeling of Stress : Not at all  Social Connections: Moderately Isolated (07/11/2023)   Social Connection and Isolation Panel [NHANES]    Frequency of Communication with Friends and Family: More than three times a week    Frequency of Social Gatherings with Friends and Family: More than three times a week    Attends Religious Services: More than 4 times per year    Active Member of [employer] or Organizations: No    Attends Engineer, structural: Never    Marital Status: Divorced    Tobacco Counseling Counseling given: Not Answered   Clinical Intake:  Pre-visit preparation completed: Yes  Pain : No/denies pain     BMI - recorded: 41.66 Nutritional Status: BMI > 30  Obese Nutritional Risks: None Diabetes: Yes CBG done?: Yes (270 per pt before) CBG resulted in Enter/ Edit results?: No Did pt. bring in CBG monitor from home?: No  How often do you need to have someone help you when you read instructions, pamphlets, or other written materials from your doctor or pharmacy?: 1 - Never  Interpreter Needed?: No  Information entered by :: Lanier Ensign, LPN   Activities of Daily Living    07/11/2023    9:07 AM  In your present state of health, do you have any difficulty performing the following activities:  Hearing? 0  Vision? 0  Difficulty concentrating or making decisions? 0  Walking or climbing stairs? 0  Dressing or bathing? 0  Doing errands, shopping? 0  Preparing Food and eating ? N  Using the Toilet? N  In the past six months, have you accidently leaked urine? N  Do you have problems with loss of bowel control? N  Managing your Medications? N  Managing your Finances? N  Housekeeping or managing your Housekeeping? N    Patient Care Team: Natalia Leatherwood, DO as PCP - General (Family Medicine) Yates Decamp, MD as  Consulting Physician (  Cardiology) Cherlyn Roberts, MD as Referring Physician (Dermatology) Jill Side, OD as Referring Physician Derrill Memo, MD as Referring Physician (Orthotics) Frankey Poot, MD as Referring Physician (Obstetrics and Gynecology) Konrad Penta, MD as Referring Physician (Gastroenterology) [facility], Konrad Dolores, MD as Attending Physician (Endocrinology)  Indicate any recent Medical Services you may have received from other than [facility] providers in the past year (date may be approximate).     Assessment:   This is a routine wellness examination for Ann [name].  Hearing/Vision screen Hearing Screening - Comments:: Pt denies any hearing issues  Vision Screening - Comments:: Pt follows up with Dr Cherlynn Polo for annual eye exams   Dietary issues and exercise activities discussed:     Goals Addressed             This Visit's Progress    Patient Stated       Wants to feel better        Depression Screen    07/11/2023    9:10 AM 07/09/2023   11:39 AM 04/04/2023    3:51 PM 07/05/2022    9:55 AM 02/14/2022   10:59 AM 09/12/2021   11:08 AM 06/22/2021    9:58 AM  PHQ 2/9 Scores  PHQ - 2 Score 0 0 0 0 0 0 0  PHQ- 9 Score     3 5     Fall Risk    07/11/2023    9:12 AM 07/09/2023   11:39 AM 04/04/2023    3:50 PM 07/05/2022    9:57 AM 09/12/2021   10:53 AM  Fall Risk   Falls in the past year? 0 0 0 0 0  Number falls in past yr: 0 0 0 0 0  Injury with Fall? 0 0 0 0 0  Risk for fall due to : Impaired vision;Impaired balance/gait   Impaired vision;Impaired mobility;Impaired balance/gait   Follow up Falls prevention discussed Falls evaluation completed Falls evaluation completed Falls prevention discussed Falls evaluation completed    MEDICARE RISK AT HOME:  Medicare Risk at Home - 07/11/23 0912     Any stairs in or around the home? Yes    If so, are there any without handrails? No    Home free of loose throw rugs in walkways, pet beds, electrical cords,  etc? Yes    Adequate lighting in your home to reduce risk of falls? Yes    Life alert? No    Use of a cane, walker or w/c? Yes    Grab bars in the bathroom? Yes    Shower chair or bench in shower? Yes    Elevated toilet seat or a handicapped toilet? Yes             TIMED UP AND GO:  Was the test performed?  No    Cognitive Function:        07/11/2023    9:15 AM 07/05/2022    9:59 AM  6CIT Screen  What Year? 0 points 0 points  What month? 0 points 0 points  What time? 0 points   Count back from 20 0 points   Months in reverse 0 points   Repeat phrase 0 points   Total Score 0 points     Immunizations Immunization History  Administered Date(s) Administered   COVID-19, mRNA, vaccine(Comirnaty)12 years and older 10/02/2022   Fluad Quad(high Dose 65+) 09/26/2021, 09/16/2022   Influenza-Unspecified 09/26/2019   Moderna SARS-COV2 Booster Vaccination 09/02/2021   Moderna Sars-Covid-2 Vaccination 01/22/2020, 02/20/2020, 10/20/2020  Pneumococcal Conjugate-13 10/12/2014   Pneumococcal Polysaccharide-23 10/21/2019   Tdap 08/04/2019    TDAP status: Up to date  Flu Vaccine status: Up to date  Pneumococcal vaccine status: Up to date  Covid-19 vaccine status: Completed vaccines  Qualifies for Shingles Vaccine? No    Screening Tests Health Maintenance  Topic Date Due   Colonoscopy  09/07/2023   INFLUENZA VACCINE  07/12/2023   HEMOGLOBIN A1C  07/16/2023   Diabetic kidney evaluation - Urine ACR  08/17/2023   FOOT EXAM  08/26/2023   OPHTHALMOLOGY EXAM  05/29/2024   Diabetic kidney evaluation - eGFR measurement  07/08/2024   Medicare Annual Wellness (AWV)  07/10/2024   MAMMOGRAM  08/31/2024   DTaP/Tdap/Td (2 - Td or Tdap) 08/03/2029   Pneumonia Vaccine 84+ Years old  Completed   DEXA SCAN  Completed   HPV VACCINES  Aged Out   COVID-19 Vaccine  Discontinued   Hepatitis C Screening  Discontinued   Zoster Vaccines- Shingrix  Discontinued    Health  Maintenance  Health Maintenance Due  Topic Date Due   Colonoscopy  09/07/2023    Colorectal cancer screening: Type of screening: Colonoscopy. Completed 09/06/18. Repeat every 5 years  Mammogram status: Completed 08/31/22. Repeat every year  Bone density 03/30/17  repeat every 2 years    Additional Screening:  Hepatitis C Screening:  Completed 07/27/20  Vision Screening: Recommended annual ophthalmology exams for early detection of glaucoma and other disorders of the eye. Is the patient up to date with their annual eye exam?  Yes  Who is the provider or what is the name of the office in which the patient attends annual eye exams? Dr Corky Mull  If pt is not established with a provider, would they like to be referred to a provider to establish care? No .   Dental Screening: Recommended annual dental exams for proper oral hygiene  Diabetic Foot Exam: Diabetic Foot Exam: Completed 05/30/23  Community Resource Referral / Chronic Care Management: CRR required this visit?  No   CCM required this visit?  No     Plan:     I have personally reviewed and noted the following in the patient's chart:   Medical and social history Use of alcohol, tobacco or illicit drugs  Current medications and supplements including opioid prescriptions. Patient is not currently taking opioid prescriptions. Functional ability and status Nutritional status Physical activity Advanced directives List of other physicians Hospitalizations, surgeries, and ER visits in previous 12 months Vitals Screenings to include cognitive, depression, and falls Referrals and appointments  In addition, I have reviewed and discussed with patient certain preventive protocols, quality metrics, and best practice recommendations. A written personalized care plan for preventive services as well as general preventive health recommendations were provided to patient.     Marzella Schlein, LPN   1/61/0960   After Visit  Summary: (Mail) Due to this being a telephonic visit, the after visit summary with patients personalized plan was offered to patient via mail   Nurse Notes: none

## 2023-07-13 ENCOUNTER — Other Ambulatory Visit (HOSPITAL_BASED_OUTPATIENT_CLINIC_OR_DEPARTMENT_OTHER): Payer: Self-pay

## 2023-07-13 ENCOUNTER — Telehealth: Payer: Self-pay | Admitting: Family Medicine

## 2023-07-13 DIAGNOSIS — E519 Thiamine deficiency, unspecified: Secondary | ICD-10-CM

## 2023-07-13 MED ORDER — VITAMIN B1 100 MG PO TABS: ORAL_TABLET | ORAL | 1 refills | Status: AC

## 2023-07-13 NOTE — Telephone Encounter (Signed)
Please call patient Her thiamine (B1) levels are low.  Low thiamine levels also cause difficulty with memory and even speech.  I have called in a thiamine supplement at her local [facility] to start immediately.  Awaiting on MRI results

## 2023-07-13 NOTE — Telephone Encounter (Signed)
Spoke with patient regarding results/recommendations.  

## 2023-07-16 ENCOUNTER — Ambulatory Visit
Admission: RE | Admit: 2023-07-16 | Discharge: 2023-07-16 | Disposition: A | Discharge status: Home / Self Care | Payer: Medicare Other | Source: Ambulatory Visit | Attending: Family Medicine | Admitting: Family Medicine

## 2023-07-16 DIAGNOSIS — R1031 Right lower quadrant pain: Secondary | ICD-10-CM

## 2023-07-16 DIAGNOSIS — R197 Diarrhea, unspecified: Secondary | ICD-10-CM

## 2023-07-16 DIAGNOSIS — R634 Abnormal weight loss: Secondary | ICD-10-CM

## 2023-07-16 MED ORDER — IOPAMIDOL (ISOVUE-300) INJECTION 61%
100.0000 mL | Freq: Once | INTRAVENOUS | Status: AC | PRN
  Administered 2023-07-16: 100 mL via INTRAVENOUS

## 2023-07-17 ENCOUNTER — Ambulatory Visit (INDEPENDENT_AMBULATORY_CARE_PROVIDER_SITE_OTHER): Payer: Medicare Other

## 2023-07-17 DIAGNOSIS — E538 Deficiency of other specified B group vitamins: Secondary | ICD-10-CM | POA: Diagnosis not present

## 2023-07-17 MED ORDER — CYANOCOBALAMIN 1000 MCG/ML IJ SOLN
1000.0000 ug | Freq: Once | INTRAMUSCULAR | Status: AC
Start: 2023-07-17 — End: 2023-07-17
  Administered 2023-07-17: 1000 ug via INTRAMUSCULAR

## 2023-07-17 NOTE — Progress Notes (Signed)
Pt here for 1/4 weekly B12 injection per Dr.Kuneff (B12 injections need to be started immediately once a week for 4 weeks, then injection every 2 weeks for 4 doses.)  B12 given IM, and pt tolerated injection well.  Next B12 injection scheduled for 07/24/23.

## 2023-07-18 ENCOUNTER — Other Ambulatory Visit: Payer: Self-pay

## 2023-07-18 ENCOUNTER — Emergency Department (HOSPITAL_COMMUNITY)
Admission: EM | Admit: 2023-07-18 | Discharge: 2023-07-18 | Disposition: A | Discharge status: Home / Self Care | Payer: Medicare Other | Attending: Emergency Medicine | Admitting: Emergency Medicine

## 2023-07-18 ENCOUNTER — Ambulatory Visit
Admission: RE | Admit: 2023-07-18 | Discharge: 2023-07-18 | Disposition: A | Discharge status: Home / Self Care | Payer: Medicare Other | Source: Ambulatory Visit | Attending: Family Medicine | Admitting: Family Medicine

## 2023-07-18 ENCOUNTER — Other Ambulatory Visit: Payer: Self-pay | Admitting: Family Medicine

## 2023-07-18 ENCOUNTER — Encounter (HOSPITAL_COMMUNITY): Payer: Self-pay

## 2023-07-18 ENCOUNTER — Emergency Department (HOSPITAL_COMMUNITY): Payer: Medicare Other

## 2023-07-18 DIAGNOSIS — E119 Type 2 diabetes mellitus without complications: Secondary | ICD-10-CM | POA: Insufficient documentation

## 2023-07-18 DIAGNOSIS — I482 Chronic atrial fibrillation, unspecified: Secondary | ICD-10-CM | POA: Diagnosis not present

## 2023-07-18 DIAGNOSIS — I708 Atherosclerosis of other arteries: Secondary | ICD-10-CM | POA: Diagnosis not present

## 2023-07-18 DIAGNOSIS — R4701 Aphasia: Secondary | ICD-10-CM | POA: Diagnosis not present

## 2023-07-18 DIAGNOSIS — R413 Other amnesia: Secondary | ICD-10-CM

## 2023-07-18 DIAGNOSIS — I959 Hypotension, unspecified: Secondary | ICD-10-CM | POA: Diagnosis not present

## 2023-07-18 DIAGNOSIS — I6622 Occlusion and stenosis of left posterior cerebral artery: Secondary | ICD-10-CM | POA: Diagnosis not present

## 2023-07-18 DIAGNOSIS — I6503 Occlusion and stenosis of bilateral vertebral arteries: Secondary | ICD-10-CM | POA: Diagnosis not present

## 2023-07-18 DIAGNOSIS — I639 Cerebral infarction, unspecified: Secondary | ICD-10-CM | POA: Diagnosis not present

## 2023-07-18 DIAGNOSIS — I4891 Unspecified atrial fibrillation: Secondary | ICD-10-CM | POA: Diagnosis not present

## 2023-07-18 DIAGNOSIS — Z794 Long term (current) use of insulin: Secondary | ICD-10-CM | POA: Insufficient documentation

## 2023-07-18 DIAGNOSIS — R410 Disorientation, unspecified: Secondary | ICD-10-CM | POA: Diagnosis not present

## 2023-07-18 DIAGNOSIS — R4182 Altered mental status, unspecified: Secondary | ICD-10-CM | POA: Diagnosis not present

## 2023-07-18 DIAGNOSIS — I69320 Aphasia following cerebral infarction: Secondary | ICD-10-CM

## 2023-07-18 DIAGNOSIS — I6523 Occlusion and stenosis of bilateral carotid arteries: Secondary | ICD-10-CM | POA: Diagnosis not present

## 2023-07-18 DIAGNOSIS — R4789 Other speech disturbances: Secondary | ICD-10-CM

## 2023-07-18 LAB — CBC
HCT: 41.2 % (ref 36.0–46.0)
Hemoglobin: 14 g/dL (ref 12.0–15.0)
MCH: 31.7 pg (ref 26.0–34.0)
MCHC: 34 g/dL (ref 30.0–36.0)
MCV: 93.2 fL (ref 80.0–100.0)
Platelets: 151 10*3/uL (ref 150–400)
RBC: 4.42 MIL/uL (ref 3.87–5.11)
RDW: 14 % (ref 11.5–15.5)
WBC: 7.4 10*3/uL (ref 4.0–10.5)
nRBC: 0 % (ref 0.0–0.2)

## 2023-07-18 LAB — LIPID PANEL
Cholesterol: 216 mg/dL — ABNORMAL HIGH (ref 0–200)
HDL: 39 mg/dL — ABNORMAL LOW (ref >40)
LDL Cholesterol: 134 mg/dL — ABNORMAL HIGH (ref 0–99)
Total CHOL/HDL Ratio: 5.5 RATIO
Triglycerides: 213 mg/dL — ABNORMAL HIGH (ref <150)
VLDL: 43 mg/dL — ABNORMAL HIGH (ref 0–40)

## 2023-07-18 LAB — BASIC METABOLIC PANEL
Anion gap: 15 (ref 5–15)
BUN: 37 mg/dL — ABNORMAL HIGH (ref 8–23)
CO2: 24 mmol/L (ref 22–32)
Calcium: 8.9 mg/dL (ref 8.9–10.3)
Chloride: 98 mmol/L (ref 98–111)
Creatinine, Ser: 1.25 mg/dL — ABNORMAL HIGH (ref 0.44–1.00)
GFR, Estimated: 45 mL/min — ABNORMAL LOW (ref >60)
Glucose, Bld: 126 mg/dL — ABNORMAL HIGH (ref 70–99)
Potassium: 3.5 mmol/L (ref 3.5–5.1)
Sodium: 137 mmol/L (ref 135–145)

## 2023-07-18 LAB — HEMOGLOBIN A1C
Hgb A1c MFr Bld: 9.5 % — ABNORMAL HIGH (ref 4.8–5.6)
Mean Plasma Glucose: 225.95 mg/dL

## 2023-07-18 MED ORDER — GADOPICLENOL 0.5 MMOL/ML IV SOLN
10.0000 mL | Freq: Once | INTRAVENOUS | Status: AC | PRN
  Administered 2023-07-18: 10 mL via INTRAVENOUS

## 2023-07-18 MED ORDER — IOHEXOL 350 MG/ML SOLN
75.0000 mL | Freq: Once | INTRAVENOUS | Status: AC | PRN
  Administered 2023-07-18: 75 mL via INTRAVENOUS

## 2023-07-18 NOTE — Discharge Instructions (Addendum)
It was our pleasure to provide your ER care today - we hope that you feel better.  Our neurologist indicates it is very important for you to take your PRADAXA 2x/day.   Follow up with neurology as outpatient in 1-2 weeks.   Return to ER right away if worse, new symptoms, change in speech or vision, new numbness/weakness, or other concern.

## 2023-07-18 NOTE — ED Provider Notes (Signed)
[place] EMERGENCY DEPARTMENT AT [facility] Provider Note   CSN: [id number] Arrival date & time: 07/18/23  1123     History  Chief Complaint  Patient presents with   expressive aphasia    ANIELLA [name] is [name] 72 y.o. female.  Pt with hx afib, on pradaxa, indicates ~ 2.5 weeks ago onset of trouble getting words out - pt indicates can think of what she wants to say, but then can't recall some of the words and express her thoughts as she wants to.  Patient had outpatient MRI today, was told showed subacute stroke and was sent to ED. Since onset, symptoms possibly mildly improved, no acute worsening this week. Denies numbness or weakness. Denies change in vision. Denies change in normal functional ability or ability to attend to ADLs.  Hx afib, on pradaxa, indicates has been compliant w normal meds.   The history is provided by the patient, medical records and the EMS personnel.       Home Medications Prior to Admission medications   Medication Sig Start Date End Date Taking? Authorizing Provider  allopurinol (ZYLOPRIM) 100 MG tablet Take 1 tablet (100 mg total) by mouth daily. 06/22/23   [name], Ann A, DO  ALPRAZolam (XANAX) 0.25 MG tablet Take 1 tablet (0.25 mg total) by mouth daily as needed for anxiety. 02/26/23   [name], Ann A, DO  colchicine 0.6 MG tablet 2 tabs at onset of flare, followed by 1 tab 2 hours later. Then continue 1 tab daily until flare is resolved. 11/14/22   [name], Ann A, DO  Continuous Blood Gluc Transmit (DEXCOM G6 TRANSMITTER) MISC USE AS DIRECTED 12/18/22   [name], Ann Dolores, MD  Continuous Glucose Sensor (DEXCOM G6 SENSOR) MISC Change every 10 days  DX E11.65 05/11/23   [name], Ann Dolores, MD  dabigatran (PRADAXA) 150 MG CAPS capsule Take 1 capsule (150 mg total) by mouth 2 (two) times daily. 01/31/23   Yates Decamp, MD  EPIPEN 2-PAK 0.3 MG/0.3ML SOAJ injection Inject 0.3 mg into the skin as needed. Bee stings 11/11/15   [provider]  insulin aspart (FIASP FLEXTOUCH) 100 UNIT/ML FlexTouch Pen Max daily 60 units 02/02/23   [name], Ann Dolores, MD  Insulin Glargine (BASAGLAR KWIKPEN) 100 UNIT/ML Inject 46 Units into the skin every morning. Patient taking differently: Inject 50 Units into the skin every morning. 08/25/22   [name], Ann Dolores, MD  Insulin Pen Needle (NOVOFINE PEN NEEDLE) 32G X 6 MM MISC Inject 1 Device into the skin in the morning, at noon, in the evening, and at bedtime. Use as instructed to inject insulin 04/21/22   [name], Ann Dolores, MD  Lancets [facility] DELICA PLUS [place]) MISC Apply 1 each topically 3 (three) times daily. 10/18/20   [provider]  levothyroxine (SYNTHROID) 50 MCG tablet Take 1 tablet (50 mcg total) by mouth daily before breakfast. 02/21/23   [name], Ann A, DO  metoprolol tartrate (LOPRESSOR) 50 MG tablet Take 1 tablet (50 mg total) by mouth 2 (two) times daily. 02/21/23   [name], Ann A, DO  olmesartan-hydrochlorothiazide (BENICAR HCT) 40-25 MG tablet Take 1 tablet by mouth daily. 02/21/23   Claiborne Ann [name], Ann A, DO  ONETOUCH ULTRA test strip SMARTSIG:Via Meter 10/18/20   [provider]  triamcinolone ointment (KENALOG) 0.5 % Apply topically 2 (two) times daily as needed. 02/07/21   [provider]  vitamin B-1 (VITAMIN B1) 100 MG tablet Take 2 tablets (200 mg total) by mouth daily for 7  days, THEN 1 tablet (100 mg total) daily. 07/13/23 10/08/23  Ann Pacini A, DO  Vitamin D, Ergocalciferol, (DRISDOL) 1.25 MG (50000 UNIT) CAPS capsule 1 cap PO Monday and Thursday with food 07/10/23   [name], Ann A, DO  zolpidem (AMBIEN) 5 MG tablet Take 1 tablet (5 mg total) by mouth at bedtime as needed. 02/26/23   [name], Ann A, DO      Allergies    Amoxicillin, Ampicillin, Codeine, Ezetimibe, Livalo [pitavastatin], Statins, and Welchol [colesevelam]    Review of Systems   Review of Systems  Constitutional:  Negative for chills and fever.   HENT:  Negative for trouble swallowing.   Eyes:  Negative for visual disturbance.  Respiratory:  Negative for shortness of breath.   Cardiovascular:  Negative for chest pain.  Gastrointestinal:  Negative for abdominal pain, nausea and vomiting.  Genitourinary:  Negative for flank pain.  Musculoskeletal:  Negative for back pain and neck pain.  Skin:  Negative for rash.  Neurological:  Positive for speech difficulty. Negative for weakness and headaches.  Psychiatric/Behavioral:  Negative for confusion.     Physical Exam Updated Vital Signs BP (!) 123/111   Pulse 80   Temp (!) 97.5 F (36.4 C) (Oral)   Resp 20   Ht 1.727 m (5\' 8" )   Wt 122.5 kg   SpO2 98%   BMI 41.05 kg/m  Physical Exam Vitals and nursing note reviewed.  Constitutional:      Appearance: Normal appearance. She is well-developed.  HENT:     Head: Atraumatic.     Nose: Nose normal.     Mouth/Throat:     Mouth: Mucous membranes are moist.  Eyes:     General: No scleral icterus.    Conjunctiva/sclera: Conjunctivae normal.     Pupils: Pupils are equal, round, and reactive to light.  Neck:     Vascular: No carotid bruit.     Trachea: No tracheal deviation.  Cardiovascular:     Rate and Rhythm: Normal rate. Rhythm irregular.     Pulses: Normal pulses.     Heart sounds: Normal heart sounds. No murmur heard.    No friction rub. No gallop.  Pulmonary:     Effort: Pulmonary effort is normal. No respiratory distress.     Breath sounds: Normal breath sounds.  Abdominal:     General: There is no distension.     Palpations: Abdomen is soft.     Tenderness: There is no abdominal tenderness.  Musculoskeletal:        General: No swelling.     Cervical back: Normal range of motion and neck supple. No rigidity. No muscular tenderness.  Skin:    General: Skin is warm and dry.     Findings: No rash.  Neurological:     Mental Status: She is alert.     Comments: Alert, speech clear, fluent. Pt occasionally with  halting pattern to speech when trying to express certain words or phrases c/w mild expressive aphasia. Motor intact bil, stre 5/5. No pronator drift. Sens grossly intact.   Psychiatric:        Mood and Affect: Mood normal.     ED Results / Procedures / Treatments   Labs (all labs ordered are listed, but only abnormal results are displayed) Results for orders placed or performed during the hospital encounter of 07/18/23  Basic metabolic panel  Result Value Ref Range   Sodium 137 135 - 145 mmol/L   Potassium 3.5 3.5 - 5.1  mmol/L   Chloride 98 98 - 111 mmol/L   CO2 24 22 - 32 mmol/L   Glucose, Bld 126 (H) 70 - 99 mg/dL   BUN 37 (H) 8 - 23 mg/dL   Creatinine, Ser 4.13 (H) 0.44 - 1.00 mg/dL   Calcium 8.9 8.9 - 24.4 mg/dL   GFR, Estimated 45 (L) >60 mL/min   Anion gap 15 5 - 15  CBC  Result Value Ref Range   WBC 7.4 4.0 - 10.5 K/uL   RBC 4.42 3.87 - 5.11 MIL/uL   Hemoglobin 14.0 12.0 - 15.0 g/dL   HCT 01.0 27.2 - 53.6 %   MCV 93.2 80.0 - 100.0 fL   MCH 31.7 26.0 - 34.0 pg   MCHC 34.0 30.0 - 36.0 g/dL   RDW 64.4 03.4 - 74.2 %   Platelets 151 150 - 400 K/uL   nRBC 0.0 0.0 - 0.2 %   CT ANGIO HEAD NECK W WO CM  Result Date: 07/18/2023 CLINICAL DATA:  Neuro deficit, acute, stroke suspected. Speech disturbance over the last few days. Left basal ganglia stroke shown by MRI. EXAM: CT ANGIOGRAPHY HEAD AND NECK WITH AND WITHOUT CONTRAST TECHNIQUE: Multidetector CT imaging of the head and neck was performed using the standard protocol during bolus administration of intravenous contrast. Multiplanar CT image reconstructions and MIPs were obtained to evaluate the vascular anatomy. Carotid stenosis measurements (when applicable) are obtained utilizing NASCET criteria, using the distal internal carotid diameter as the denominator. RADIATION DOSE REDUCTION: This exam was performed according to the departmental dose-optimization program which includes automated exposure control, adjustment of the mA  and/or kV according to patient size and/or use of iterative reconstruction technique. CONTRAST:  75mL OMNIPAQUE IOHEXOL 350 MG/ML SOLN COMPARISON:  MRI same day FINDINGS: CT HEAD FINDINGS Brain: Abnormal low-density seen in the region of the left basal ganglia and radiating white matter tracts consistent with the subacute infarction shown by MRI. No definite hyperdense blood products by CT. Elsewhere, the brain shows age related volume loss with mild chronic small-vessel change of the white matter. No mass, hydrocephalus or extra-axial collection. Vascular: There is atherosclerotic calcification of the major vessels at the base of the brain. Skull: Negative Sinuses/Orbits: Clear/normal Other: None Review of the MIP images confirms the above findings CTA NECK FINDINGS Aortic arch: Aortic atherosclerosis. Branching pattern is normal. Severe stenosis of the proximal left subclavian artery. Right carotid system: Common carotid artery is tortuous and shows scattered plaque but is widely patent to the bifurcation. Calcified plaque at the carotid bifurcation and ICA bulb. Minimal diameter of the proximal ICA is 3 mm. Compared to [name] more distal cervical ICA diameter of 4 mm, this indicates [name] 25% stenosis. Left carotid system: Common carotid artery shows scattered plaque but is sufficiently patent to the bifurcation region. Calcified plaque at the carotid bifurcation and ICA bulb. Minimal diameter at the proximal ICA is 2 mm. Compared to [name] more distal cervical ICA diameter of 4 mm, this indicates [name] 50% stenosis. Vertebral arteries: Atherosclerotic plaque at the right vertebral artery origin with 50% stenosis. Beyond that, the vessel is patent through the cervical region to the foramen magnum. Severe stenosis of the proximal left subclavian artery as noted above. There is occlusion of the proximal left vertebral artery. The vessel shows some reconstituted flow beginning in the upper cervical region due to cervical collaterals.  The vessel does show flow through the foramen magnum. Skeleton: Ordinary cervical spondylosis. Other neck: No mass or lymphadenopathy. Upper chest:  Lung apices are clear. Review of the MIP images confirms the above findings CTA HEAD FINDINGS Anterior circulation: Both internal carotid arteries are patent through the skull base and siphon regions. There is siphon atherosclerotic calcification with stenosis estimated at 30-50% on both sides. The anterior and middle cerebral vessels are patent. No large vessel occlusion or correctable focal proximal stenosis. No aneurysm or vascular malformation. Posterior circulation: Both vertebral arteries show antegrade flow through the foramen magnum. No stenosis of the right vertebral artery. 30% stenosis of the left vertebral artery at the foramen magnum. Both vessels reach the basilar artery. No basilar stenosis. Posterior circulation branch vessels are patent. There is [name] severe focal stenosis of the left PCA at the P1 P2 junction. Venous sinuses: Patent and normal. Anatomic variants: None significant otherwise. Review of the MIP images confirms the above findings IMPRESSION: 1. Aortic atherosclerosis. 2. Atherosclerotic disease at both carotid bifurcations. 25% stenosis of the proximal ICA on the right and 50% stenosis of the proximal ICA on the left. 3. 50% stenosis of the right vertebral artery origin. Severe stenosis of the proximal left subclavian artery. Occlusion of the proximal left vertebral artery with reconstituted flow in the upper cervical region due to cervical collaterals. 30% stenosis of the left vertebral artery at the foramen magnum. Severe focal stenosis of the left PCA at the P1 P2 junction. 4. Atherosclerotic disease in both carotid siphon regions with stenosis estimated at 30-50% on both sides. Aortic Atherosclerosis (ICD10-I70.0). Electronically Signed   By: Paulina Fusi M.D.   On: 07/18/2023 14:13   MR BRAIN W WO CONTRAST  Result Date:  07/18/2023 CLINICAL DATA:  Altered mental status, word finding difficulty for 6 weeks. EXAM: MRI HEAD WITHOUT AND WITH CONTRAST TECHNIQUE: Multiplanar, multiecho pulse sequences of the brain and surrounding structures were obtained without and with intravenous contrast. CONTRAST:  10 cc Vueway COMPARISON:  None Available. FINDINGS: Brain: There is diffusion restriction with associated FLAIR signal abnormality in the left caudate and corona radiata consistent with acute to early subacute infarct. T1 hyperintensity and ill-defined SWI signal dropout is consistent with petechial hemorrhage. There is no mass effect or midline shift. There is no other evidence of acute infarct or intracranial hemorrhage. There is no extra-axial fluid collection. Background parenchymal volume is normal for age. The ventricles are normal in size. Patchy FLAIR signal abnormality in the supratentorial white matter and pons likely reflects underlying chronic small-vessel ischemic change, mild for age. The pituitary and suprasellar region are normal. There is no mass lesion or abnormal enhancement. Vascular: Normal flow voids. Skull and upper cervical spine: Normal marrow signal. Sinuses/Orbits: The paranasal sinuses are clear. The globes and orbits are unremarkable. Other: The mastoid air cells and middle ear cavities are clear. IMPRESSION: Acute to early subacute infarct in the left basal ganglia with petechial hemorrhage but no mass effect or midline shift. Findings were initially discussed with the MRI technologist at the time of scan acquisition, who was advised to direct the patient to the emergency department. The findings were then discussed with EMS at approximately 11:00 am. Findings discussed with provider Ann [name] at 11:22 am. Electronically Signed   By: Lesia Hausen M.D.   On: 07/18/2023 11:29    EKG EKG Interpretation Date/Time:  Wednesday July 18 2023 11:36:26 EDT Ventricular Rate:  57 PR Interval:    QRS  Duration:  104 QT Interval:  404 QTC Calculation: 394 R Axis:   59  Text Interpretation: Atrial fibrillation Non-specific ST-t changes Confirmed  by Cathren Laine ([id number]) on 07/18/2023 11:43:45 AM  Radiology CT ANGIO HEAD NECK W WO CM  Result Date: 07/18/2023 CLINICAL DATA:  Neuro deficit, acute, stroke suspected. Speech disturbance over the last few days. Left basal ganglia stroke shown by MRI. EXAM: CT ANGIOGRAPHY HEAD AND NECK WITH AND WITHOUT CONTRAST TECHNIQUE: Multidetector CT imaging of the head and neck was performed using the standard protocol during bolus administration of intravenous contrast. Multiplanar CT image reconstructions and MIPs were obtained to evaluate the vascular anatomy. Carotid stenosis measurements (when applicable) are obtained utilizing NASCET criteria, using the distal internal carotid diameter as the denominator. RADIATION DOSE REDUCTION: This exam was performed according to the departmental dose-optimization program which includes automated exposure control, adjustment of the mA and/or kV according to patient size and/or use of iterative reconstruction technique. CONTRAST:  75mL OMNIPAQUE IOHEXOL 350 MG/ML SOLN COMPARISON:  MRI same day FINDINGS: CT HEAD FINDINGS Brain: Abnormal low-density seen in the region of the left basal ganglia and radiating white matter tracts consistent with the subacute infarction shown by MRI. No definite hyperdense blood products by CT. Elsewhere, the brain shows age related volume loss with mild chronic small-vessel change of the white matter. No mass, hydrocephalus or extra-axial collection. Vascular: There is atherosclerotic calcification of the major vessels at the base of the brain. Skull: Negative Sinuses/Orbits: Clear/normal Other: None Review of the MIP images confirms the above findings CTA NECK FINDINGS Aortic arch: Aortic atherosclerosis. Branching pattern is normal. Severe stenosis of the proximal left subclavian artery. Right carotid  system: Common carotid artery is tortuous and shows scattered plaque but is widely patent to the bifurcation. Calcified plaque at the carotid bifurcation and ICA bulb. Minimal diameter of the proximal ICA is 3 mm. Compared to [name] more distal cervical ICA diameter of 4 mm, this indicates [name] 25% stenosis. Left carotid system: Common carotid artery shows scattered plaque but is sufficiently patent to the bifurcation region. Calcified plaque at the carotid bifurcation and ICA bulb. Minimal diameter at the proximal ICA is 2 mm. Compared to [name] more distal cervical ICA diameter of 4 mm, this indicates [name] 50% stenosis. Vertebral arteries: Atherosclerotic plaque at the right vertebral artery origin with 50% stenosis. Beyond that, the vessel is patent through the cervical region to the foramen magnum. Severe stenosis of the proximal left subclavian artery as noted above. There is occlusion of the proximal left vertebral artery. The vessel shows some reconstituted flow beginning in the upper cervical region due to cervical collaterals. The vessel does show flow through the foramen magnum. Skeleton: Ordinary cervical spondylosis. Other neck: No mass or lymphadenopathy. Upper chest: Lung apices are clear. Review of the MIP images confirms the above findings CTA HEAD FINDINGS Anterior circulation: Both internal carotid arteries are patent through the skull base and siphon regions. There is siphon atherosclerotic calcification with stenosis estimated at 30-50% on both sides. The anterior and middle cerebral vessels are patent. No large vessel occlusion or correctable focal proximal stenosis. No aneurysm or vascular malformation. Posterior circulation: Both vertebral arteries show antegrade flow through the foramen magnum. No stenosis of the right vertebral artery. 30% stenosis of the left vertebral artery at the foramen magnum. Both vessels reach the basilar artery. No basilar stenosis. Posterior circulation branch vessels are patent.  There is [name] severe focal stenosis of the left PCA at the P1 P2 junction. Venous sinuses: Patent and normal. Anatomic variants: None significant otherwise. Review of the MIP images confirms the above findings IMPRESSION: 1. Aortic atherosclerosis.  2. Atherosclerotic disease at both carotid bifurcations. 25% stenosis of the proximal ICA on the right and 50% stenosis of the proximal ICA on the left. 3. 50% stenosis of the right vertebral artery origin. Severe stenosis of the proximal left subclavian artery. Occlusion of the proximal left vertebral artery with reconstituted flow in the upper cervical region due to cervical collaterals. 30% stenosis of the left vertebral artery at the foramen magnum. Severe focal stenosis of the left PCA at the P1 P2 junction. 4. Atherosclerotic disease in both carotid siphon regions with stenosis estimated at 30-50% on both sides. Aortic Atherosclerosis (ICD10-I70.0). Electronically Signed   By: Paulina Fusi M.D.   On: 07/18/2023 14:13   MR BRAIN W WO CONTRAST  Result Date: 07/18/2023 CLINICAL DATA:  Altered mental status, word finding difficulty for 6 weeks. EXAM: MRI HEAD WITHOUT AND WITH CONTRAST TECHNIQUE: Multiplanar, multiecho pulse sequences of the brain and surrounding structures were obtained without and with intravenous contrast. CONTRAST:  10 cc Vueway COMPARISON:  None Available. FINDINGS: Brain: There is diffusion restriction with associated FLAIR signal abnormality in the left caudate and corona radiata consistent with acute to early subacute infarct. T1 hyperintensity and ill-defined SWI signal dropout is consistent with petechial hemorrhage. There is no mass effect or midline shift. There is no other evidence of acute infarct or intracranial hemorrhage. There is no extra-axial fluid collection. Background parenchymal volume is normal for age. The ventricles are normal in size. Patchy FLAIR signal abnormality in the supratentorial white matter and pons likely reflects  underlying chronic small-vessel ischemic change, mild for age. The pituitary and suprasellar region are normal. There is no mass lesion or abnormal enhancement. Vascular: Normal flow voids. Skull and upper cervical spine: Normal marrow signal. Sinuses/Orbits: The paranasal sinuses are clear. The globes and orbits are unremarkable. Other: The mastoid air cells and middle ear cavities are clear. IMPRESSION: Acute to early subacute infarct in the left basal ganglia with petechial hemorrhage but no mass effect or midline shift. Findings were initially discussed with the MRI technologist at the time of scan acquisition, who was advised to direct the patient to the emergency department. The findings were then discussed with EMS at approximately 11:00 am. Findings discussed with provider Ann [name] at 11:22 am. Electronically Signed   By: Lesia Hausen M.D.   On: 07/18/2023 11:29    Procedures Procedures    Medications Ordered in ED Medications  iohexol (OMNIPAQUE) 350 MG/ML injection 75 mL (75 mLs Intravenous Contrast Given 07/18/23 1250)    ED Course/ Medical Decision Making/ [name]&P                                 Medical Decision Making Problems Addressed: Aphasia due to recent stroke: acute illness or injury with systemic symptoms that poses [name] threat to life or bodily functions Atrial fibrillation, chronic (HCC): chronic illness or injury with exacerbation, progression, or side effects of treatment that poses [name] threat to life or bodily functions Expressive aphasia: acute illness or injury with systemic symptoms that poses [name] threat to life or bodily functions Insulin dependent type 2 diabetes mellitus (HCC): chronic illness or injury with exacerbation, progression, or side effects of treatment that poses [name] threat to life or bodily functions  Amount and/or Complexity of Data Reviewed Independent Historian: EMS    Details: hx External Data Reviewed: notes. Labs: ordered. Decision-making details  documented in ED Course. Radiology: ordered and independent  interpretation performed. Decision-making details documented in ED Course. ECG/medicine tests: ordered and independent interpretation performed. Decision-making details documented in ED Course.  Risk Prescription drug management. Decision regarding hospitalization.   Iv ns. Continuous pulse ox and cardiac monitoring. Labs ordered/sent. Imaging reviewed.  Differential diagnosis includes cva, tia, etc. Dispo decision including potential need for admission considered - will get labs and imaging and reassess.   Reviewed nursing notes and prior charts for additional history. External reports reviewed. Additional history from: EMS.   Cardiac monitor: afib, rate 70. Neurology consulted.   Labs reviewed/interpreted by me - wbc and hgb normal. K normal.   MRI reviewed/interpreted by me - no acute cva.   Recheck, no new c/o, pt requests d/c, indicates not willing to be admitted or stay for further testing.   Neurology rec outpt f/u, and to instruct to take her pradaxa bid, pt indicates has adequate of her meds at home.   Pt currently appears stable for d/c.   Return precautions provided.             Final Clinical Impression(s) / ED Diagnoses Final diagnoses:  Expressive aphasia  Atrial fibrillation, chronic (HCC)  Insulin dependent type 2 diabetes mellitus (HCC)  Aphasia due to recent stroke    Rx / DC Orders ED Discharge Orders     None         Cathren Laine, MD 07/18/23 1517

## 2023-07-18 NOTE — ED Triage Notes (Signed)
Pt bib [facility] from [facility] imagining where she went for routine MRI after having dysphasia for a few days. Pt was sent here for treatment of subacute stroke. Pt arrives aox4, resp eu

## 2023-07-18 NOTE — ED Notes (Signed)
EMS told patient that the hospital would get her back to her car at a different medical facility and when this RN told her that the ambulance will not take her back she stated she would just walk because it was on [address]. This RN offered patient to get social work involved to get her back to her car at the imaging place. She declined the offer and took a bus pass to help get her back to her car.

## 2023-07-18 NOTE — ED Notes (Signed)
Patient ambulated to the restroom

## 2023-07-18 NOTE — ED Notes (Signed)
Patient transported to CT 

## 2023-07-18 NOTE — Consult Note (Addendum)
Neurology Consultation  Reason for Consult: Stroke Referring Physician: Dr. Denton Lank  CC:  History is obtained from: patient  HPI: Ann [name] is a 72 y.o. female with a past medical history of Afib on pradaxa, hypertension, and diabetes presenting with ~2 weeks of difficulty getting words out/expressive aphasia. The patient knows what she wants to say, but is unable to get the words out. This problem has been constant over the last 2 weeks. She had an outpatient MRI today which showed acute to early subacute infarct in the left basal ganglia and she was advised to come to the ED. She continues to have word finding difficulty. No dysphagia or vision difficulties.   The patient also endorses weakness in both her legs, but attributes this to her known osteoarthritis. This is not a new symptom. Additionally, the patient states that she is not taking the Pradaxa twice daily, she is only taking it once a day, and has been doing it this way for the past year.     LKW: >2 weeks ago TNK given?: No  Mechanical Thrombectomy? No Premorbid modified Rankin scale (mRS): 1 1-No significant post stroke disability and can perform usual duties with stroke symptoms   ROS: Full ROS was performed and is negative except as noted in the HPI.   Past Medical History:  Diagnosis Date   Arthritis    osteoarthritis- knees.   Atrial fibrillation (HCC)    Dr. Maisie Fus status post cardioversion chronic anticoagulation   Cellulitis of right lower extremity 12/30/2020   Chicken pox    Colon cancer (HCC) 2001   Radiation, chemo.  Adenocarcinoma of the colon.  Followed by Dr. Kennith Center   Complication of anesthesia    sensitive to narcotics"extra sleepy". Versed- "no memory recall for 4 days postop"   Diabetes mellitus without complication (HCC)    oral and Insulin   Hepatitis    "sub clinical case of Hepatitis in late 70's""has immunities"   Hypertension    Hypothyroidism    Laceration of head 08/04/2019   8 cm  laceration forehead/scalp after tripping when mowing her lawn.   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   Ovarian teratoma, right 09/2000   Oophorectomy   Sleep apnea    unable to tolreate cpap   Wisdom teeth extracted       Family History  Problem Relation Age of Onset   Arthritis Mother    Colon cancer Mother    Diabetes Mother    Atrial fibrillation Mother    Diabetes Father    Heart attack Father    Heart disease Father    Stroke Father    Hypertension Maternal Grandmother    Stroke Maternal Grandmother    Stroke Maternal Grandfather    Hypertension Paternal Grandmother    Stroke Paternal Grandmother    Diabetes Paternal Grandfather    Hypertension Paternal Grandfather    Stroke Paternal Grandfather    Diabetes Brother    Brain cancer Brother    Heart disease Brother    Hypertension Brother    Stroke Brother      Social History:   reports that she quit smoking about 26 years ago. Her smoking use included cigarettes. She started smoking about 51 years ago. She has never used smokeless tobacco. She reports that she does not drink alcohol and does not use drugs.  Medications  No current facility-administered medications for this encounter.  Current Outpatient Medications:    allopurinol (ZYLOPRIM) 100 MG tablet, Take 1 tablet (100 mg total) by mouth daily., Disp: 90 tablet, Rfl: 0   ALPRAZolam (XANAX) 0.25 MG tablet, Take 1 tablet (0.25 mg total) by mouth daily as needed for anxiety., Disp: 30 tablet, Rfl: 5   colchicine 0.6 MG tablet, 2 tabs at onset of flare, followed by 1 tab 2 hours later. Then continue 1 tab daily until flare is resolved., Disp: 30 tablet, Rfl: 5   Continuous Blood Gluc Transmit (DEXCOM G6 TRANSMITTER) MISC, USE AS DIRECTED, Disp: 1 each, Rfl: 3   Continuous Glucose Sensor (DEXCOM G6 SENSOR) MISC, Change every 10 days  DX E11.65, Disp: 9  each, Rfl: 3   dabigatran (PRADAXA) 150 MG CAPS capsule, Take 1 capsule (150 mg total) by mouth 2 (two) times daily., Disp: 200 capsule, Rfl: 3   EPIPEN 2-PAK 0.3 MG/0.3ML SOAJ injection, Inject 0.3 mg into the skin as needed. Bee stings, Disp: , Rfl: 1   insulin aspart (FIASP FLEXTOUCH) 100 UNIT/ML FlexTouch Pen, Max daily 60 units, Disp: 60 mL, Rfl: 3   Insulin Glargine (BASAGLAR KWIKPEN) 100 UNIT/ML, Inject 46 Units into the skin every morning. (Patient taking differently: Inject 50 Units into the skin every morning.), Disp: 45 mL, Rfl: 3   Insulin Pen Needle (NOVOFINE PEN NEEDLE) 32G X 6 MM MISC, Inject 1 Device into the skin in the morning, at noon, in the evening, and at bedtime. Use as instructed to inject insulin, Disp: 400 each, Rfl: 3   Lancets (ONETOUCH DELICA PLUS LANCET33G) MISC, Apply 1 each topically 3 (three) times daily., Disp: , Rfl:    levothyroxine (SYNTHROID) 50 MCG tablet, Take 1 tablet (50 mcg total) by mouth daily before breakfast., Disp: 90 tablet, Rfl: 3   metoprolol tartrate (LOPRESSOR) 50 MG tablet, Take 1 tablet (50 mg total) by mouth 2 (two) times daily., Disp: 180 tablet, Rfl: 1   olmesartan-hydrochlorothiazide (BENICAR HCT) 40-25 MG tablet, Take 1 tablet by mouth daily., Disp: 90 tablet, Rfl: 1   ONETOUCH ULTRA test strip, SMARTSIG:Via Meter, Disp: , Rfl:    triamcinolone ointment (KENALOG) 0.5 %, Apply topically 2 (two) times daily as needed., Disp: , Rfl:    vitamin B-1 (VITAMIN B1) 100 MG tablet, Take 2 tablets (200 mg total) by mouth daily for 7 days, THEN 1 tablet (100 mg total) daily., Disp: 100 tablet, Rfl: 1   Vitamin D, Ergocalciferol, (DRISDOL) 1.25 MG (50000 UNIT) CAPS capsule, 1 cap PO Monday and Thursday with food, Disp: 24 capsule, Rfl: 0   zolpidem (AMBIEN) 5 MG tablet, Take 1 tablet (5 mg total) by mouth at bedtime as needed., Disp: 30 tablet, Rfl: 5  (Not in a hospital admission)     Exam: Current vital signs: BP 122/77   Pulse 62   Temp (!)  97.5 F (36.4 C) (Oral)   Resp 20   Ht 5\' 8"  (1.727 m)   Wt 122.5 kg   SpO2 98%   BMI 41.05 kg/m  Vital signs in last 24 hours: Temp:  [97.5 F (36.4 C)] 97.5 F (36.4 C) (08/07 1128) Pulse Rate:  [62] 62 (08/07 1128) Resp:  [20] 20 (08/07 1128) BP: (122)/(77) 122/77 (08/07 1134) SpO2:  [98 %] 98 % (08/07 1128) Weight:  [122.5 kg] 122.5 kg (08/07 1126)  GENERAL: Awake, alert in NAD HEENT: - Normocephalic and atraumatic, dry mm, no LN++, no Thyromegally LUNGS - Clear to auscultation  bilaterally with no wheezes CV - S1S2 RRR, no m/r/g, equal pulses bilaterally. ABDOMEN - Soft, nontender, nondistended with normoactive BS Ext: warm, well perfused, intact peripheral pulses, 1+ edema  NEURO:  Mental Status: AA&Ox3  Language: speech is clear.  Naming, repetition, fluency, and comprehension intact. Still has some expressive aphasia.  Cranial Nerves: PERRL. EOMI, visual fields full, no facial asymmetry, facial sensation intact, hearing intact, tongue/uvula/soft palate midline, normal sternocleidomastoid and trapezius muscle strength. No evidence of tongue atrophy or fasciculations Motor: 5/5 strength bilateral upper and lower extremities Tone: is normal and bulk is normal Sensation- Intact to light touch bilaterally Coordination: FTN intact bilaterally, no ataxia in BLE. Gait- deferred    Labs I have reviewed labs in epic and the results pertinent to this consultation are:   CBC unremarkable Cr 1.25  CBC    Component Value Date/Time   WBC 7.4 07/18/2023 1137   RBC 4.42 07/18/2023 1137   HGB 14.0 07/18/2023 1137   HCT 41.2 07/18/2023 1137   PLT 151 07/18/2023 1137   MCV 93.2 07/18/2023 1137   MCH 31.7 07/18/2023 1137   MCHC 34.0 07/18/2023 1137   RDW 14.0 07/18/2023 1137   LYMPHSABS 0.9 07/09/2023 1111   MONOABS 0.4 07/09/2023 1111   EOSABS 0.1 07/09/2023 1111   BASOSABS 0.1 07/09/2023 1111    CMP     Component Value Date/Time   NA 137 07/18/2023 1137   K 3.5  07/18/2023 1137   CL 98 07/18/2023 1137   CO2 24 07/18/2023 1137   GLUCOSE 126 (H) 07/18/2023 1137   BUN 37 (H) 07/18/2023 1137   BUN 37 (A) 06/27/2019 0000   CREATININE 1.25 (H) 07/18/2023 1137   CALCIUM 8.9 07/18/2023 1137   PROT 6.9 07/09/2023 1111   ALBUMIN 4.2 07/09/2023 1111   AST 24 07/09/2023 1111   ALT 19 07/09/2023 1111   ALKPHOS 84 07/09/2023 1111   BILITOT 1.9 (H) 07/09/2023 1111   GFRNONAA 45 (L) 07/18/2023 1137   GFRAA >60 01/26/2016 0359    Lipid Panel     Component Value Date/Time   CHOL 232 (H) 02/21/2023 1009   TRIG 311.0 (H) 02/21/2023 1009   HDL 41.30 02/21/2023 1009   CHOLHDL 6 02/21/2023 1009   VLDL 62.2 (H) 02/21/2023 1009   LDLCALC 160 04/02/2019 0000   LDLDIRECT 139.0 02/21/2023 1009     Imaging I have reviewed the images obtained:  MRI examination of the brain: IMPRESSION: Acute to early subacute infarct in the left basal ganglia with petechial hemorrhage but no mass effect or midline shift.  Assessment:  72 y.o. female presenting with ~2 weeks of expressive aphasia and found to have acute to early subacute infarct in left basal ganglia with petechial hemorrhage on outpatient MRI. Stroke is likely secondary to afib, as she has not been taking her Pradaxa twice daily as prescribed. Emphasized the importance of taking this medication twice daily to prevent a devastating stroke.    Recommendations: - HgbA1c, fasting lipid panel - The following imaging if indicated  - CTA head/neck - Resume home Pradaxa 150 mg twice daily  - Risk factor modification - Stable to discharge   Rayann Atway, DO Internal Medicine Resident, PGY-3   NEUROHOSPITALIST ADDENDUM Performed a face to face diagnostic evaluation.   I have reviewed the contents of history and physical exam as documented by PA/ARNP/Resident and agree with above documentation.  I have discussed and formulated the above plan as documented. Edits to the note have been made  as  needed.  Impression/Key exam findings/Plan: presents with 2 wk hx of aphasia. MRI brain shows a large left basal ganglia stroke. The stroke appears embolic since it is too large to be small vessel. On further questioning, she endorses taking pradaxa once a day instead of the prescribed twice a day. She seems to be aware that she should be taking it once a day. Her reasoning for taking it once a day is not entirely clear. We discussed disabling nature of strokes and the importance of taking her pradaxa twice a day. She expresses verbal understanding and assures me that going forward, she will take it twice a day.  MRI also shows tiny petechial hemorrhage. Given symptoms have been ongoing for 2 weeks and that the petechial hemorrhage is barely visible on MRI and does not show up on same day CT Head, should be fine for her to resume pradaxa and risk of rehemorrhage is fairly low.  With the negative CTA head and neck, she can be discharged with outpatient neurology follow up.  Will see if we can collect Lipid panel and Hemoglobin A1c. Patient is adamant about going home. Infact, wont even take her shoes off and wants to leave hospital ASAP.  Erick Blinks, MD [facility] [phone]   If 7pm to 7am, please call on call as listed on AMION.

## 2023-07-23 ENCOUNTER — Telehealth: Payer: Self-pay | Admitting: Family Medicine

## 2023-07-23 DIAGNOSIS — I517 Cardiomegaly: Secondary | ICD-10-CM

## 2023-07-23 DIAGNOSIS — K5790 Diverticulosis of intestine, part unspecified, without perforation or abscess without bleeding: Secondary | ICD-10-CM

## 2023-07-23 DIAGNOSIS — I2510 Atherosclerotic heart disease of native coronary artery without angina pectoris: Secondary | ICD-10-CM | POA: Insufficient documentation

## 2023-07-23 DIAGNOSIS — I700 Atherosclerosis of aorta: Secondary | ICD-10-CM | POA: Insufficient documentation

## 2023-07-23 HISTORY — DX: Diverticulosis of intestine, part unspecified, without perforation or abscess without bleeding: K57.90

## 2023-07-23 NOTE — Telephone Encounter (Signed)
Spoke with patient regarding results/recommendations.  

## 2023-07-23 NOTE — Telephone Encounter (Signed)
Please call patient, have received the results of her CT abdomen That she has diverticulosis of the colon, but no diverticulitis or inflammation.  No other concerning features were visualized of her abdomen.   Incidentally, it did note she has mild cardiomegaly (enlargement of the heart), aortic atherosclerosis, coronary artery and mitral annular calcifications. -We will forward these over to her cardiology team to have on file.  I recommend she discuss this with them on her next appointment.

## 2023-07-24 ENCOUNTER — Ambulatory Visit: Payer: Medicare Other | Admitting: Family Medicine

## 2023-07-24 ENCOUNTER — Encounter: Payer: Self-pay | Admitting: Family Medicine

## 2023-07-24 VITALS — BP 131/74 | HR 90 | Temp 97.9°F | Wt 281.4 lb

## 2023-07-24 DIAGNOSIS — I6381 Other cerebral infarction due to occlusion or stenosis of small artery: Secondary | ICD-10-CM

## 2023-07-24 DIAGNOSIS — R4789 Other speech disturbances: Secondary | ICD-10-CM | POA: Diagnosis not present

## 2023-07-24 DIAGNOSIS — I6501 Occlusion and stenosis of right vertebral artery: Secondary | ICD-10-CM

## 2023-07-24 DIAGNOSIS — K5790 Diverticulosis of intestine, part unspecified, without perforation or abscess without bleeding: Secondary | ICD-10-CM

## 2023-07-24 DIAGNOSIS — R413 Other amnesia: Secondary | ICD-10-CM | POA: Diagnosis not present

## 2023-07-24 DIAGNOSIS — Z823 Family history of stroke: Secondary | ICD-10-CM

## 2023-07-24 DIAGNOSIS — I517 Cardiomegaly: Secondary | ICD-10-CM

## 2023-07-24 DIAGNOSIS — I6522 Occlusion and stenosis of left carotid artery: Secondary | ICD-10-CM | POA: Diagnosis not present

## 2023-07-24 DIAGNOSIS — I2510 Atherosclerotic heart disease of native coronary artery without angina pectoris: Secondary | ICD-10-CM | POA: Diagnosis not present

## 2023-07-24 DIAGNOSIS — E519 Thiamine deficiency, unspecified: Secondary | ICD-10-CM

## 2023-07-24 DIAGNOSIS — I6523 Occlusion and stenosis of bilateral carotid arteries: Secondary | ICD-10-CM | POA: Diagnosis not present

## 2023-07-24 DIAGNOSIS — I700 Atherosclerosis of aorta: Secondary | ICD-10-CM | POA: Diagnosis not present

## 2023-07-24 DIAGNOSIS — I771 Stricture of artery: Secondary | ICD-10-CM | POA: Insufficient documentation

## 2023-07-24 DIAGNOSIS — E559 Vitamin D deficiency, unspecified: Secondary | ICD-10-CM | POA: Diagnosis not present

## 2023-07-24 DIAGNOSIS — I6502 Occlusion and stenosis of left vertebral artery: Secondary | ICD-10-CM | POA: Diagnosis not present

## 2023-07-24 HISTORY — DX: Other cerebral infarction due to occlusion or stenosis of small artery: I63.81

## 2023-07-24 NOTE — Progress Notes (Signed)
Ann [name] , 1951-12-02, 72 y.o., female MRN: [id number] Patient Care Team    Relationship Specialty Notifications Start End  Natalia Leatherwood, DO PCP - General Family Medicine  07/18/23   Yates Decamp, MD Consulting Physician Cardiology  04/20/20   Cherlyn Roberts, MD Referring Physician Dermatology  04/20/20   Jill Side, OD Referring Physician   04/20/20   Derrill Memo, MD Referring Physician Orthotics  04/20/20   Frankey Poot, MD Referring Physician Obstetrics and Gynecology  04/20/20   Konrad Penta, MD Referring Physician Gastroenterology  04/20/20   Shamleffer, Konrad Dolores, MD Attending Physician Endocrinology  01/31/23     Chief Complaint  Patient presents with   memory changes   Cerebrovascular Accident     Subjective: Ann [name] is a 72 y.o. to follow-up on last visit for abdominal pain and memory speech deficit.    Abdominal pain: We reviewed CT findings today which included diverticulosis without any diverticular flare.  No other concerning features concerning her abdomen. Prior note: Pt presents for an OV with complaints of chronic diarrhea of over 6 mos duration.  Associated symptoms include Rlq pain.  She is established with digestive health specialist, was assigned to a new provider since Dr. Jason Fila had retired.  Patient reports they did not feel that her diarrhea and fecal urgency required more of a workup and scheduled her for her colonoscopy in September.  She denies fever, chills, nausea, vomiting.  She denies any blood per rectum.  She reports she has had no normal bowel movements for the last 6 months.  She is wondering if she has an adhesion from her mesh.  She has a lack of appetite.  She has lost 6 pounds unintentionally.  Basal ganglia stroke: Patient presented 2 weeks ago with memory and speech changes.  She had had those changes for over a week prior to seeking treatment.  During her workup we found that she had a basal ganglia stroke.  She  was also found to be deficient in B1, B12 and vitamin D.  She has started the thiamine and the vitamin D supplements prescribed.  She is now on her second B12 injection. She reports she has noticed her speech has improved, but she still has occasions where she is having word finding difficulty.  She still feels fatigued.  And she is starting to feel some mild depression concerning her having a stroke.  She endorses she had only been taking the Pradaxa once a day. Patient additionally underwent CTA head and neck in which she was found to have subclavian stenosis, carotid artery stenosis, vascular occlusions of the vertebral artery (see full report below). Prior note: She also reports having abrupt onset memory changes and word finding difficulty.  She states she has noticed on 07/01/2023 she was having problems speaking.  She states she knew in her brain which she wanted to say but it was not coming out where she could not think of the word though it was supposed to be.  Since that event she has noticed she is sleeping more and keeping to herself.  She feels like she is lost track of time over the last week.  She spoke with her sister the day before she was having word finding difficulty and her sister had noticed her speech was slower.  Patient reports compliance with Pradaxa.  She is concerned that she may have had a small stroke, since it runs in her family and her  brother died of a stroke.  He denies any other neurological changes. She reports sugars have been high.  TSH normal 02/21/2023   MRI brain 07/18/2023: IMPRESSION: Acute to early subacute infarct in the left basal ganglia with petechial hemorrhage but no mass effect or midline shift.  CT abdomen: 07/16/2023 IMPRESSION: 1. Colonic diverticulosis with no acute diverticulitis. 2. Mild cardiomegaly. 3. Aortic Atherosclerosis (ICD10-I70.0) including coronary artery and mitral annular calcification. 4. Status post cholecystectomy and  hysterectomy.  07/18/2023 CT angio head and neck: IMPRESSION: 1. Aortic atherosclerosis. 2. Atherosclerotic disease at both carotid bifurcations. 25% stenosis of the proximal ICA on the right and 50% stenosis of the proximal ICA on the left. 3. 50% stenosis of the right vertebral artery origin. Severe stenosis of the proximal left subclavian artery. Occlusion of the proximal left vertebral artery with reconstituted flow in the upper cervical region due to cervical collaterals. 30% stenosis of the left vertebral artery at the foramen magnum. Severe focal stenosis of the left PCA at the P1 P2 junction. 4. Atherosclerotic disease in both carotid siphon regions with stenosis estimated at 30-50% on both sides.      07/11/2023    9:10 AM 07/09/2023   11:39 AM 04/04/2023    3:51 PM 07/05/2022    9:55 AM 02/14/2022   10:59 AM  Depression screen PHQ 2/9  Decreased Interest 0 0 0 0 0  Down, Depressed, Hopeless 0 0 0 0 0  PHQ - 2 Score 0 0 0 0 0  Altered sleeping     3  Tired, decreased energy     0  Change in appetite     0  Feeling bad or failure about yourself      0  Trouble concentrating     0  Moving slowly or fidgety/restless     0  Suicidal thoughts     0  PHQ-9 Score     3    Allergies  Allergen Reactions   Amoxicillin Shortness Of Breath, Swelling and Anaphylaxis    Has patient had a PCN reaction causing immediate rash, facial/tongue/throat swelling, SOB or lightheadedness with hypotension: Yes Has patient had a PCN reaction causing severe rash involving mucus membranes or skin necrosis: No Has patient had a PCN reaction that required hospitalization No Has patient had a PCN reaction occurring within the last 10 years: Yes If all of the above answers are "NO", then may proceed with Cephalosporin use.  Has patient had a PCN reaction causing immediate rash, facial/tongue/throat swelling, SOB or lightheadedness with hypotension: Yes Has patient had a PCN reaction causing severe  rash involving mucus membranes or skin necrosis: No Has patient had a PCN reaction that required hospitalization No Has patient had a PCN reaction occurring within the last 10 years: Yes If all of the above answers are "NO", then may proceed with Cephalosporin use. Has patient had a PCN reaction causing immediate rash, facial/tongue/throat swelling, SOB or lightheadedness with hypotension: Yes Has patient had a PCN reaction causing severe rash involving mucus membranes or skin necrosis: No Has patient had a PCN reaction that required hospitalization No Has patient had a PCN reaction occurring within the last 10 years: Yes If all of the above answers are "NO", then may proceed with Cephalosporin use.    Ampicillin    Codeine Itching    Tolerates hydrocodone   Ezetimibe    Livalo [Pitavastatin]    Statins Other (See Comments)    Muscle skeletal side effects  Welchol [Colesevelam]     Gi se   Social History   Social History Narrative   Marital status/children/pets: Single. G0P0.   Education/employment: Retired Charity fundraiser:      -smoke alarm in the home:Yes     - wears seatbelt: Yes     - Feels safe in their relationships: Yes   Past Medical History:  Diagnosis Date   Arthritis    osteoarthritis- knees.   Atrial fibrillation (HCC)    Dr. Maisie Fus status post cardioversion chronic anticoagulation   Cellulitis of right lower extremity 12/30/2020   Chicken pox    Colon cancer (HCC) 2001   Radiation, chemo.  Adenocarcinoma of the colon.  Followed by Dr. Kennith Center   Complication of anesthesia    sensitive to narcotics"extra sleepy". Versed- "no memory recall for 4 days postop"   Diabetes mellitus without complication (HCC)    oral and Insulin   Hepatitis    "sub clinical case of Hepatitis in late 70's""has immunities"   Hypertension    Hypothyroidism    Laceration of head 08/04/2019   8 cm laceration forehead/scalp after tripping when mowing her  lawn.   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   Ovarian teratoma, right 09/2000   Oophorectomy   Sleep apnea    unable to tolreate cpap   Wisdom teeth extracted    Past Surgical History:  Procedure Laterality Date   ABDOMINAL HYSTERECTOMY  09/2000    Total hysterectomy and oophorectomy   ABDOMINAL WOUND DEHISCENCE Bilateral    multiple times / mesh use with multiple abdominal hernia repair   CARDIOVERSION  2014   Unsuccessful.  Patient reports cardiac ablation was not felt to be appropriate next step by cardiology.   CHOLECYSTECTOMY  04/1998   '98 -Lapraroscopic   COLON RESECTION  2001   "colon cancer"   FOOT SURGERY Bilateral    multiple "hammer toes and bunionectomies"   KNEE ARTHROSCOPY Left 08/2006   meniscectomy   MELANOMA EXCISION     near scapula   TOTAL KNEE ARTHROPLASTY Right 01/24/2016   Procedure: RIGHT TOTAL KNEE ARTHROPLASTY;  Surgeon: Ollen Gross, MD;  Location: [facility];  Service: Orthopedics;  Laterality: Right;   Family History  Problem Relation Age of Onset   Arthritis Mother    Colon cancer Mother    Diabetes Mother    Atrial fibrillation Mother    Diabetes Father    Heart attack Father    Heart disease Father    Stroke Father    Hypertension Maternal Grandmother    Stroke Maternal Grandmother    Stroke Maternal Grandfather    Hypertension Paternal Grandmother    Stroke Paternal Grandmother    Diabetes Paternal Grandfather    Hypertension Paternal Grandfather    Stroke Paternal Grandfather    Diabetes Brother    Brain cancer Brother    Heart disease Brother    Hypertension Brother    Stroke Brother    Allergies as of 07/24/2023       Reactions   Amoxicillin Shortness Of Breath, Swelling, Anaphylaxis   Has patient had a PCN reaction causing immediate rash, facial/tongue/throat swelling, SOB or lightheadedness with hypotension:  Yes Has patient had a PCN reaction causing severe rash involving mucus membranes or skin necrosis: No Has patient had a PCN reaction that required hospitalization No Has patient  had a PCN reaction occurring within the last 10 years: Yes If all of the above answers are "NO", then may proceed with Cephalosporin use. Has patient had a PCN reaction causing immediate rash, facial/tongue/throat swelling, SOB or lightheadedness with hypotension: Yes Has patient had a PCN reaction causing severe rash involving mucus membranes or skin necrosis: No Has patient had a PCN reaction that required hospitalization No Has patient had a PCN reaction occurring within the last 10 years: Yes If all of the above answers are "NO", then may proceed with Cephalosporin use. Has patient had a PCN reaction causing immediate rash, facial/tongue/throat swelling, SOB or lightheadedness with hypotension: Yes Has patient had a PCN reaction causing severe rash involving mucus membranes or skin necrosis: No Has patient had a PCN reaction that required hospitalization No Has patient had a PCN reaction occurring within the last 10 years: Yes If all of the above answers are "NO", then may proceed with Cephalosporin use.   Ampicillin    Codeine Itching   Tolerates hydrocodone   Ezetimibe    Livalo [pitavastatin]    Statins Other (See Comments)   Muscle skeletal side effects   Welchol [colesevelam]    Gi se        Medication List        Accurate as of July 24, 2023 12:40 PM. If you have any questions, ask your nurse or doctor.          allopurinol 100 MG tablet Commonly known as: ZYLOPRIM Take 1 tablet (100 mg total) by mouth daily.   ALPRAZolam 0.25 MG tablet Commonly known as: XANAX Take 1 tablet (0.25 mg total) by mouth daily as needed for anxiety.   Basaglar KwikPen 100 UNIT/ML Inject 46 Units into the skin every morning. What changed: how much to take   colchicine 0.6 MG tablet 2 tabs at onset of  flare, followed by 1 tab 2 hours later. Then continue 1 tab daily until flare is resolved.   dabigatran 150 MG Caps capsule Commonly known as: PRADAXA Take 1 capsule (150 mg total) by mouth 2 (two) times daily.   Dexcom G6 Sensor Misc Change every 10 days  DX E11.65   Dexcom G6 Transmitter Misc USE AS DIRECTED   EpiPen 2-Pak 0.3 MG/0.3ML Soaj injection Generic drug: EPINEPHrine Inject 0.3 mg into the skin as needed. Bee stings   Fiasp FlexTouch 100 UNIT/ML FlexTouch Pen Generic drug: insulin aspart Max daily 60 units   levothyroxine 50 MCG tablet Commonly known as: SYNTHROID Take 1 tablet (50 mcg total) by mouth daily before breakfast.   metoprolol tartrate 50 MG tablet Commonly known as: LOPRESSOR Take 1 tablet (50 mg total) by mouth 2 (two) times daily.   Novofine Pen Needle 32G X 6 MM Misc Generic drug: Insulin Pen Needle Inject 1 Device into the skin in the morning, at noon, in the evening, and at bedtime. Use as instructed to inject insulin   olmesartan-hydrochlorothiazide 40-25 MG tablet Commonly known as: BENICAR HCT Take 1 tablet by mouth daily.   OneTouch Delica Plus Lancet33G Misc Apply 1 each topically 3 (three) times daily.   OneTouch Ultra test strip Generic drug: glucose blood SMARTSIG:Via Meter   thiamine 100 MG tablet Commonly known as: VITAMIN B1 Take 2 tablets (200 mg total) by mouth daily for 7 days, THEN 1 tablet (100 mg total) daily. Start taking on: July 13, 2023   triamcinolone ointment 0.5 % Commonly known as: KENALOG Apply topically 2 (two) times daily as  needed.   Vitamin D (Ergocalciferol) 1.25 MG (50000 UNIT) Caps capsule Commonly known as: DRISDOL 1 cap PO Monday and Thursday with food   zolpidem 5 MG tablet Commonly known as: AMBIEN Take 1 tablet (5 mg total) by mouth at bedtime as needed.        All past medical history, surgical history, allergies, family history, immunizations andmedications were updated in the EMR  today and reviewed under the history and medication portions of their EMR.     ROS Negative, with the exception of above mentioned in HPI   Objective:  BP 131/74   Pulse 90   Temp 97.9 F (36.6 C)   Wt 281 lb 6.4 oz (127.6 kg)   SpO2 94%   BMI 42.79 kg/m  Body mass index is 42.79 kg/m. Physical Exam Vitals and nursing note reviewed.  Constitutional:      General: She is not in acute distress.    Appearance: Normal appearance. She is not ill-appearing, toxic-appearing or diaphoretic.  HENT:     Head: Normocephalic and atraumatic.  Eyes:     General: No scleral icterus.       Right eye: No discharge.        Left eye: No discharge.     Extraocular Movements: Extraocular movements intact.     Conjunctiva/sclera: Conjunctivae normal.     Pupils: Pupils are equal, round, and reactive to light.  Pulmonary:     Effort: Pulmonary effort is normal.  Neurological:     Mental Status: She is alert and oriented to person, place, and time. Mental status is at baseline.     Cranial Nerves: No facial asymmetry.     Motor: No weakness.     Coordination: Coordination normal.     Gait: Gait normal.     Comments: Infrequent, but present word finding difficulty remains.  Psychiatric:        Attention and Perception: Attention normal.        Mood and Affect: Mood normal. Affect is flat.        Speech: Speech is delayed.        Behavior: Behavior is slowed and withdrawn.        Thought Content: Thought content normal. Thought content is not paranoid or delusional. Thought content does not include homicidal or suicidal ideation.        Cognition and Memory: She exhibits impaired recent memory.        Judgment: Judgment normal.     Comments: Struggling with word finding      No results found. No results found. No results found for this or any previous visit (from the past 24 hour(s)).  Assessment/Plan: Ann [name] is a 73 y.o. female present for OV for  Basal ganglia  stroke/memory changes/Word finding difficulty/family history stroke Today we reviewed findings of MRI and CTA head and neck. Refer to neurology has been placed for her. Cardiologist was made aware of recent stroke Patient reports she has started taking her Pradaxa twice a day as prescribed now.   Thiamine deficiency/vitamin D deficiency/B12 deficiency/word finding difficulties/fatigue Continue B1 supplement Continue ergocalciferol supplement B12 injections to continue as prescribed. Follow-up in 8 weeks with provider for retesting.  Aortic atherosclerosis (HCC)/cardiomegaly/coronary artery calcification/subclavian artery stenosis/vertebral artery occlusion/left carotid artery stenosis/internal carotid artery stenosis bilaterally/stenosis of right vertebral artery Patient is now taking Pradaxa twice daily as prescribed by cardiology. We reviewed MRI and CTA head and neck results today. Will refer to vascular surgery to further  evaluate the multiple levels of stenosis and occlusion throughout her head and neck vasculature and provide her recommendations. - Ambulatory referral to Vascular Surgery  Diverticulosis Reassured patient her CT abdomen results were reassuring.  Diverticulosis only present.  Reviewed expectations re: course of current medical issues. Discussed self-management of symptoms. Outlined signs and symptoms indicating need for more acute intervention. Patient verbalized understanding and all questions were answered. Patient received an After-Visit Summary.    Orders Placed This Encounter  Procedures   Ambulatory referral to Neurology   Ambulatory referral to Vascular Surgery   No orders of the defined types were placed in this encounter.  Referral Orders         Ambulatory referral to Neurology         Ambulatory referral to Vascular Surgery       Note is dictated utilizing voice recognition software. Although note has been proof read prior to signing,  occasional typographical errors still can be missed. If any questions arise, please do not hesitate to call for verification.   electronically signed by:  Felix Pacini, DO  [facility] - OR

## 2023-07-24 NOTE — Patient Instructions (Addendum)
Return in about 1 week (around 07/31/2023) for nurse visit b12 and 8 weeks with provider for vitamin def.        Great to see you today.  I have refilled the medication(s) we provide.   If labs were collected or images ordered, we will inform you of  results once we have received them and reviewed. We will contact you either by echart message, or telephone call.  Please give ample time to the testing facility, and our office to run,  receive and review results. Please do not call inquiring of results, even if you can see them in your chart. We will contact you as soon as we are able. If it has been over 1 week since the test was completed, and you have not yet heard from [place], then please call us.    - echart message- for normal results that have been seen by the patient already.   - telephone call: abnormal results or if patient has not viewed results in their echart.  If a referral to a specialist was entered for you, please call us in 2 weeks if you have not heard from the specialist office to schedule. '

## 2023-07-25 ENCOUNTER — Other Ambulatory Visit: Payer: Self-pay | Admitting: Family Medicine

## 2023-07-25 ENCOUNTER — Telehealth: Payer: Self-pay

## 2023-07-25 NOTE — Telephone Encounter (Signed)
Transition Care Management Follow-up Telephone Call Date of discharge and from where: 07/18/2023 [facility] How have you been since you were released from the hospital? Patient she is feeling about the same, but she is able to verbalize her thoughts better. Any questions or concerns? No  Items Reviewed: Did the pt receive and understand the discharge instructions provided? Yes  Medications obtained and verified?  No medication prescribed. Other? No  Any new allergies since your discharge? No  Dietary orders reviewed? Yes Do you have support at home? Yes   Follow up appointments reviewed:  PCP Hospital f/u appt confirmed? Yes  Scheduled to see Natalia Leatherwood, DO on 07/24/2023 @  [facility] [place]. Specialist Hospital f/u appt confirmed? No  Scheduled to see  on  @ . Are transportation arrangements needed? No  If their condition worsens, is the pt aware to call PCP or go to the Emergency Dept.? Yes Was the patient provided with contact information for the PCP's office or ED? Yes Was to pt encouraged to call back with questions or concerns? Yes   [facility]  [facility] Population Health Community Resource Care Guide   [email or web address]  ?? [phone]   Website: [email or web address]  [email or web address]

## 2023-07-26 NOTE — Progress Notes (Signed)
I will schedule her for a visit, looks complex.  F/U stroke and A. Fib and carotid stenosis

## 2023-07-27 ENCOUNTER — Encounter: Payer: Self-pay | Admitting: Physician Assistant

## 2023-07-27 ENCOUNTER — Encounter: Payer: Self-pay | Admitting: Internal Medicine

## 2023-07-27 ENCOUNTER — Ambulatory Visit (INDEPENDENT_AMBULATORY_CARE_PROVIDER_SITE_OTHER): Payer: Medicare Other | Admitting: Internal Medicine

## 2023-07-27 VITALS — BP 114/70 | HR 74 | Ht 68.0 in | Wt 273.0 lb

## 2023-07-27 DIAGNOSIS — E1169 Type 2 diabetes mellitus with other specified complication: Secondary | ICD-10-CM

## 2023-07-27 DIAGNOSIS — Z794 Long term (current) use of insulin: Secondary | ICD-10-CM | POA: Diagnosis not present

## 2023-07-27 DIAGNOSIS — E1165 Type 2 diabetes mellitus with hyperglycemia: Secondary | ICD-10-CM | POA: Diagnosis not present

## 2023-07-27 DIAGNOSIS — E1159 Type 2 diabetes mellitus with other circulatory complications: Secondary | ICD-10-CM | POA: Diagnosis not present

## 2023-07-27 DIAGNOSIS — E785 Hyperlipidemia, unspecified: Secondary | ICD-10-CM | POA: Diagnosis not present

## 2023-07-27 MED ORDER — SEMAGLUTIDE(0.25 OR 0.5MG/DOS) 2 MG/3ML ~~LOC~~ SOPN: 0.5000 mg | PEN_INJECTOR | SUBCUTANEOUS | 3 refills | Status: DC

## 2023-07-27 NOTE — Progress Notes (Signed)
Name: Ann [name]  Age/ Sex: 73 y.o., female   MRN/ DOB: [id number], 1951/07/11     PCP: Natalia Leatherwood, DO   Reason for Endocrinology Evaluation: Type 2 Diabetes Mellitus  Initial Endocrine Consultative Visit: 09/22/2020    PATIENT IDENTIFIER: Ann [name] is a 72 y.o. female with a past medical history of T2Dm, A. Fib ,HTn and dyslipidemia, Has hx of colon cancer  . The patient has followed with Endocrinology clinic since 2021 for consultative assistance with management of her diabetes.  DIABETIC HISTORY:  Ann [name] was diagnosed with DM in 2013. Intolerant to Metformin.Farxiga and invokana caused genital irritation.Trulicity -caused urinary retention . Her hemoglobin A1c has ranged from 7.6% in 2022, peaking at 8.8% in 2022.    She had a normal thyroid ultrasound in May 2023, which was prompted by thyromegaly on physical exam.  She was followed up by Dr. Everardo All from 2021 until March 2023 SUBJECTIVE:   During the last visit (01/15/2023): Marland Kitchen A1c 8.6 %     Today (07/27/2023): Ann [name] is here for a follow up on diabetes management .She checks her blood sugars multiple times a day through CGM. The patient has had hypoglycemic episodes since the last clinic visit, she is symptomatic with these episodes  She presented to the ED with aphasia, was diagnosed with acute CVA 07/18/2023  She takes 20 units of Novolog once a day with a meal , she takes   Had bug bites  9 weeks ago which made her sick and had been eating one meal a day   She has diarrhea  Denies nausea or vomiting    HOME DIABETES REGIMEN:  Basaglar 50 units daily  Novolog 10 units TIDQAC- takes 20 units  Novolog 5 units with a snack     Statin: yes ACE-I/ARB: yes    CONTINUOUS GLUCOSE MONITORING RECORD INTERPRETATION    Dates of Recording:1/23-01/15/2023  Sensor description:dexcom  Results statistics:   CGM use % of time 71  Average and SD 225/71  Time in range 28 %  % Time Above 180  35  % Time above 250 36  % Time Below target <1      Glycemic patterns summary: Bg's trend down at night but high during the day   Hyperglycemic episodes  postprandial   Hypoglycemic episodes occurred after a bolus   Overnight periods: variable       DIABETIC COMPLICATIONS: Microvascular complications:  Denies:  Last Eye Exam: Completed 2023  Macrovascular complications:  CVA 07/2023 Denies: CAD, PVD   HISTORY:  Past Medical History:  Past Medical History:  Diagnosis Date   Arthritis    osteoarthritis- knees.   Atrial fibrillation (HCC)    Dr. Maisie Fus status post cardioversion chronic anticoagulation   Cellulitis of right lower extremity 12/30/2020   Chicken pox    Colon cancer (HCC) 2001   Radiation, chemo.  Adenocarcinoma of the colon.  Followed by Dr. Kennith Center   Complication of anesthesia    sensitive to narcotics"extra sleepy". Versed- "no memory recall for 4 days postop"   Diabetes mellitus without complication (HCC)    oral and Insulin   Hepatitis    "sub clinical case of Hepatitis in late 70's""has immunities"   Hypertension    Hypothyroidism    Laceration of head 08/04/2019   8 cm laceration forehead/scalp after tripping when mowing her lawn.   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   Ovarian teratoma, right 09/2000   Oophorectomy   Sleep apnea    unable to tolreate cpap   Wisdom teeth extracted    Past Surgical History:  Past Surgical History:  Procedure Laterality Date   ABDOMINAL HYSTERECTOMY  09/2000    Total hysterectomy and oophorectomy   ABDOMINAL WOUND DEHISCENCE Bilateral    multiple times / mesh use with multiple abdominal hernia repair   CARDIOVERSION  2014   Unsuccessful.  Patient reports cardiac ablation was not felt to be appropriate next step by cardiology.   CHOLECYSTECTOMY  04/1998   '98 -Lapraroscopic   COLON RESECTION   2001   "colon cancer"   FOOT SURGERY Bilateral    multiple "hammer toes and bunionectomies"   KNEE ARTHROSCOPY Left 08/2006   meniscectomy   MELANOMA EXCISION     near scapula   TOTAL KNEE ARTHROPLASTY Right 01/24/2016   Procedure: RIGHT TOTAL KNEE ARTHROPLASTY;  Surgeon: Ollen Gross, MD;  Location: [facility];  Service: Orthopedics;  Laterality: Right;   Social History:  reports that she quit smoking about 26 years ago. Her smoking use included cigarettes. She started smoking about 51 years ago. She has never used smokeless tobacco. She reports that she does not drink alcohol and does not use drugs. Family History:  Family History  Problem Relation Age of Onset   Arthritis Mother    Colon cancer Mother    Diabetes Mother    Atrial fibrillation Mother    Diabetes Father    Heart attack Father    Heart disease Father    Stroke Father    Hypertension Maternal Grandmother    Stroke Maternal Grandmother    Stroke Maternal Grandfather    Hypertension Paternal Grandmother    Stroke Paternal Grandmother    Diabetes Paternal Grandfather    Hypertension Paternal Grandfather    Stroke Paternal Grandfather    Diabetes Brother    Brain cancer Brother    Heart disease Brother    Hypertension Brother    Stroke Brother      HOME MEDICATIONS: Allergies as of 07/27/2023       Reactions   Amoxicillin Shortness Of Breath, Swelling, Anaphylaxis   Has patient had a PCN reaction causing immediate rash, facial/tongue/throat swelling, SOB or lightheadedness with hypotension: Yes Has patient had a PCN reaction causing severe rash involving mucus membranes or skin necrosis: No Has patient had a PCN reaction that required hospitalization No Has patient had a PCN reaction occurring within the last 10 years: Yes If all of the above answers are "NO", then may proceed with Cephalosporin use. Has patient had a PCN reaction causing immediate rash, facial/tongue/throat swelling, SOB or lightheadedness  with hypotension: Yes Has patient had a PCN reaction causing severe rash involving mucus membranes or skin necrosis: No Has patient had a PCN reaction that required hospitalization No Has patient had a PCN reaction occurring within the last 10 years: Yes If all of the above answers are "NO", then may proceed with Cephalosporin use. Has patient had a PCN reaction causing immediate rash, facial/tongue/throat swelling, SOB or lightheadedness with hypotension: Yes Has patient had a PCN reaction causing severe rash involving mucus membranes or skin necrosis: No Has patient had a PCN reaction that required hospitalization No Has patient had a PCN reaction occurring within the last 10 years: Yes If all of the above answers are "NO", then may proceed with Cephalosporin use.   Ampicillin  Codeine Itching   Tolerates hydrocodone   Ezetimibe    Livalo [pitavastatin]    Statins Other (See Comments)   Muscle skeletal side effects   Welchol [colesevelam]    Gi se        Medication List        Accurate as of July 27, 2023  1:06 PM. If you have any questions, ask your nurse or doctor.          allopurinol 100 MG tablet Commonly known as: ZYLOPRIM Take 1 tablet (100 mg total) by mouth daily.   ALPRAZolam 0.25 MG tablet Commonly known as: XANAX Take 1 tablet (0.25 mg total) by mouth daily as needed for anxiety.   Basaglar KwikPen 100 UNIT/ML Inject 46 Units into the skin every morning. What changed: how much to take   colchicine 0.6 MG tablet 2 tabs at onset of flare, followed by 1 tab 2 hours later. Then continue 1 tab daily until flare is resolved.   dabigatran 150 MG Caps capsule Commonly known as: PRADAXA Take 1 capsule (150 mg total) by mouth 2 (two) times daily.   Dexcom G6 Sensor Misc Change every 10 days  DX E11.65   Dexcom G6 Transmitter Misc USE AS DIRECTED   EpiPen 2-Pak 0.3 MG/0.3ML Soaj injection Generic drug: EPINEPHrine Inject 0.3 mg into the skin as  needed. Bee stings   Fiasp FlexTouch 100 UNIT/ML FlexTouch Pen Generic drug: insulin aspart Max daily 60 units   levothyroxine 50 MCG tablet Commonly known as: SYNTHROID Take 1 tablet (50 mcg total) by mouth daily before breakfast.   metoprolol tartrate 50 MG tablet Commonly known as: LOPRESSOR Take 1 tablet (50 mg total) by mouth 2 (two) times daily.   Novofine Pen Needle 32G X 6 MM Misc Generic drug: Insulin Pen Needle Inject 1 Device into the skin in the morning, at noon, in the evening, and at bedtime. Use as instructed to inject insulin   olmesartan-hydrochlorothiazide 40-25 MG tablet Commonly known as: BENICAR HCT Take 1 tablet by mouth daily.   OneTouch Delica Plus Lancet33G Misc Apply 1 each topically 3 (three) times daily.   OneTouch Ultra test strip Generic drug: glucose blood SMARTSIG:Via Meter   thiamine 100 MG tablet Commonly known as: VITAMIN B1 Take 2 tablets (200 mg total) by mouth daily for 7 days, THEN 1 tablet (100 mg total) daily. Start taking on: July 13, 2023   triamcinolone ointment 0.5 % Commonly known as: KENALOG Apply topically 2 (two) times daily as needed.   Vitamin D (Ergocalciferol) 1.25 MG (50000 UNIT) Caps capsule Commonly known as: DRISDOL 1 cap PO Monday and Thursday with food   zolpidem 5 MG tablet Commonly known as: AMBIEN Take 1 tablet (5 mg total) by mouth at bedtime as needed.         OBJECTIVE:   Vital Signs: BP 114/70 (BP Location: Left Arm, Patient Position: Sitting, Cuff Size: Large)   Pulse 74   Ht 5\' 8"  (1.727 m)   Wt 273 lb (123.8 kg)   SpO2 97%   BMI 41.51 kg/m   Wt Readings from Last 3 Encounters:  07/27/23 273 lb (123.8 kg)  07/24/23 281 lb 6.4 oz (127.6 kg)  07/18/23 270 lb (122.5 kg)     Exam: General: Pt appears well and is in NAD  Right thyroid nodule palpated   Lungs: Clear with good BS bilat   Heart: RRR   Extremities: Non pitting  pretibial edema.   Neuro: MS is good  with appropriate  affect, pt is alert and Ox3   DM Foot Exam 07/27/2023 The skin of the feet is  without sores or ulcerations, pt noted with discoloration of the 1st and 2nd right toes, cold to the touch  The pedal pulses are 1+ on right and 1+ on left. The sensation is intact to a screening 5.07, 10 gram monofilament bilaterally        DATA REVIEWED:  Lab Results  Component Value Date   HGBA1C 9.5 (H) 07/18/2023   HGBA1C 8.6 (A) 01/15/2023   HGBA1C 8.2 (A) 08/25/2022    Latest Reference Range & Units 07/18/23 11:37  Sodium 135 - 145 mmol/L 137  Potassium 3.5 - 5.1 mmol/L 3.5  Chloride 98 - 111 mmol/L 98  CO2 22 - 32 mmol/L 24  Glucose 70 - 99 mg/dL 476 (H)  BUN 8 - 23 mg/dL 37 (H)  Creatinine 5.46 - 1.00 mg/dL 5.03 (H)  Calcium 8.9 - 10.3 mg/dL 8.9  Anion gap 5 - 15  15    Latest Reference Range & Units 07/18/23 15:30  Total CHOL/HDL Ratio RATIO 5.5  Cholesterol 0 - 200 mg/dL 546 (H)  HDL Cholesterol >40 mg/dL 39 (L)  LDL (calc) 0 - 99 mg/dL 568 (H)  Triglycerides <150 mg/dL 127 (H)  VLDL 0 - 40 mg/dL 43 (H)  (H): Data is abnormally high (L): Data is abnormally low  Old records , labs and images have been reviewed.     ASSESSMENT / PLAN / RECOMMENDATIONS:   1) Type 2 Diabetes Mellitus, Poorly controlled, without complications - Most recent A1c of 9.5 %. Goal A1c < 7.0 %.     - Pt with hyperglycemia  -Patient with fear of hypoglycemia which limits optimizing glucose control  -Intolerant to metformin and SGLT2 inhibitors -Historically she has declined GLP 1 agonist due to risk of cancer.  Today we discussed the importance of considering this given her most recent CVA, we discussed the cardiovascular as well as renal benefits as well as GI side effects -I will reduce insulin preemptively as below to prevent hypoglycemia while on Ozempic -She was given patient assistance forms in case Ozempic is cost prohibitive  MEDICATIONS: Start Ozempic 0.25 mg once weekly for 6 weeks, then  increase to 0.5 mg weekly Decrease Basaglar to 46 units daily Take NovoLog 14 units before each meal  Correction scale: Novolog (BG-130/15) TIDQAC   EDUCATION / INSTRUCTIONS: BG monitoring instructions: Patient is instructed to check her blood sugars 3 times a day, before meals . Call [facility] if: BG persistently < 70  I reviewed the Rule of 15 for the treatment of hypoglycemia in detail with the patient. Literature supplied.    2) Dyslipidemia :  -LDL above goal.  LDL goal <51 mg/DL -Patient states she has been on multiple statins before with side effects, she could not recall specific side effects but she is not keen on starting any at this time.  We did discuss Repatha as an option but she does not want to add any more injections at this time -Will discuss this further if no treatment has been started by different provider  F/U in 3 months       Signed electronically by: Lyndle Herrlich, MD  [facility]  [facility] [address] [place] [place], [place] [address] Phone: [phone] FAX: [phone]   CC: Natalia Leatherwood, DO [address] [place] [address] Phone: [phone]  Fax: [phone]  Return to Endocrinology clinic as below: Future Appointments  Date Time Provider Department Center  07/31/2023 10:45 AM [facility] NURSE [facility]  08/10/2023  2:30 PM Yates Decamp, MD [facility] None  08/29/2023 11:20 AM Maeola Harman, MD [facility]  09/18/2023 10:20 AM Felix Pacini A, DO [facility]  01/31/2024 11:00 AM Yates Decamp, MD [facility] None  07/16/2024  8:45 AM [facility] ANNUAL WELLNESS VISIT [facility]

## 2023-07-27 NOTE — Patient Instructions (Signed)
-   Start Ozempic 0.25 mg once weekly for 6 weeks than increase to 0.5 mg weekly  - Decrease Basaglar 46 units daily  - Take Novolog 14 units with each meal  - Novolog correctional insulin: ADD extra units on insulin to your meal-time Novolog dose if your blood sugars are higher than 145. Use the scale below to help guide you:   Blood sugar before meal Number of units to inject  145 0 unit  146 - 160 1 units  161 - 175 2 units  176 - 190 3 units  191 - 205 4 units  206 - 220 5 units  221 - 235 6 units  236 - 250 7 units  251 - 265 8 units  266 - 280 9 units   281- 295 10 units      HOW TO TREAT LOW BLOOD SUGARS (Blood sugar LESS THAN 70 MG/DL) Please follow the RULE OF 15 for the treatment of hypoglycemia treatment (when your (blood sugars are less than 70 mg/dL)   STEP 1: Take 15 grams of carbohydrates when your blood sugar is low, which includes:  3-4 GLUCOSE TABS  OR 3-4 OZ OF JUICE OR REGULAR SODA OR ONE TUBE OF GLUCOSE GEL    STEP 2: RECHECK blood sugar in 15 MINUTES STEP 3: If your blood sugar is still low at the 15 minute recheck --> then, go back to STEP 1 and treat AGAIN with another 15 grams of carbohydrates.

## 2023-07-31 ENCOUNTER — Ambulatory Visit (INDEPENDENT_AMBULATORY_CARE_PROVIDER_SITE_OTHER): Payer: Medicare Other

## 2023-07-31 DIAGNOSIS — E538 Deficiency of other specified B group vitamins: Secondary | ICD-10-CM

## 2023-07-31 MED ORDER — CYANOCOBALAMIN 1000 MCG/ML IJ SOLN
1000.0000 ug | Freq: Once | INTRAMUSCULAR | Status: AC
Start: 2023-07-31 — End: 2023-07-31
  Administered 2023-07-31: 1000 ug via INTRAMUSCULAR

## 2023-07-31 NOTE — Progress Notes (Signed)
 Pt here for 1/4 weekly B12 injection per Dr.Kuneff (B12 injections need to be started immediately once a week for 4 weeks, then injection every 2 weeks for 4 doses.)  B12 given IM, and pt tolerated injection well.  Next B12 injection scheduled for 07/24/23.

## 2023-08-07 ENCOUNTER — Ambulatory Visit (INDEPENDENT_AMBULATORY_CARE_PROVIDER_SITE_OTHER): Payer: Medicare Other

## 2023-08-07 DIAGNOSIS — E538 Deficiency of other specified B group vitamins: Secondary | ICD-10-CM

## 2023-08-07 MED ORDER — CYANOCOBALAMIN 1000 MCG/ML IJ SOLN
1000.0000 ug | Freq: Once | INTRAMUSCULAR | Status: AC
Start: 2023-08-07 — End: 2023-08-07
  Administered 2023-08-07: 1000 ug via INTRAMUSCULAR

## 2023-08-07 NOTE — Progress Notes (Signed)
Pt here for 1/4 weekly B12 injection per Dr.Kuneff (B12 injections need to be started immediately once a week for 4 weeks, then injection every 2 weeks for 4 doses.)   B12 given IM, and pt tolerated injection well.

## 2023-08-08 ENCOUNTER — Encounter: Payer: Self-pay | Admitting: Internal Medicine

## 2023-08-10 ENCOUNTER — Ambulatory Visit: Payer: Medicare Other | Admitting: Cardiology

## 2023-08-10 ENCOUNTER — Encounter: Payer: Self-pay | Admitting: Cardiology

## 2023-08-10 VITALS — BP 93/66 | HR 78 | Resp 16 | Ht 68.0 in | Wt 280.0 lb

## 2023-08-10 DIAGNOSIS — I771 Stricture of artery: Secondary | ICD-10-CM

## 2023-08-10 DIAGNOSIS — I4821 Permanent atrial fibrillation: Secondary | ICD-10-CM

## 2023-08-10 DIAGNOSIS — I639 Cerebral infarction, unspecified: Secondary | ICD-10-CM

## 2023-08-10 DIAGNOSIS — G720 Drug-induced myopathy: Secondary | ICD-10-CM | POA: Diagnosis not present

## 2023-08-10 DIAGNOSIS — T466X5A Adverse effect of antihyperlipidemic and antiarteriosclerotic drugs, initial encounter: Secondary | ICD-10-CM | POA: Diagnosis not present

## 2023-08-10 DIAGNOSIS — E782 Mixed hyperlipidemia: Secondary | ICD-10-CM

## 2023-08-10 MED ORDER — EVOLOCUMAB 140 MG/ML ~~LOC~~ SOAJ
140.0000 mg | Freq: Once | SUBCUTANEOUS | Status: AC
Start: 2023-08-10 — End: 2023-08-10
  Administered 2023-08-10: 140 mg via SUBCUTANEOUS

## 2023-08-10 MED ORDER — REPATHA SURECLICK 140 MG/ML ~~LOC~~ SOAJ
140.0000 mg | SUBCUTANEOUS | 3 refills | Status: DC
Start: 2023-08-10 — End: 2024-02-01

## 2023-08-10 NOTE — Progress Notes (Signed)
Primary Physician/Referring:  Natalia Leatherwood, DO  Patient ID: Ann [name], female    DOB: 03/25/1951, 72 y.o.   MRN: [id number]  Chief Complaint  Patient presents with   Permanent atrial fibrillation [facility])   Follow-up   HPI:    Ann [name]  is a 72 y.o.  Patient presented to the emergency room on 07/17/2022 with expressive aphasia, MRI revealing subacute stroke.  CT angiogram/MRI revealing moderate diffuse atherosclerotic changes in bilateral ICA constituting 25 to 50% stenosis and severe stenosis and near occlusion of the left vertebral artery and also left PCA and P1 and P2 junction stenosis.  She was also found to have severe stenosis in the left subclavian artery.  Stroke felt to be embolic due to patient not taking Pradaxa as recommended.  She now presents for follow-up.  States that her expressive aphasia has improved.  She feels generally weak since stroke.  She is concerned about recurrence of stroke.  Chronic dyspnea has remained stable.  Past Medical History:  Diagnosis Date   Arthritis    osteoarthritis- knees.   Atrial fibrillation (HCC)    Dr. Maisie Fus status post cardioversion chronic anticoagulation   Cellulitis of right lower extremity 12/30/2020   Chicken pox    Colon cancer (HCC) 2001   Radiation, chemo.  Adenocarcinoma of the colon.  Followed by Dr. Kennith Center   Complication of anesthesia    sensitive to narcotics"extra sleepy". Versed- "no memory recall for 4 days postop"   Diabetes mellitus without complication (HCC)    oral and Insulin   Hepatitis    "sub clinical case of Hepatitis in late 70's""has immunities"   Hypertension    Hypothyroidism    Laceration of head 08/04/2019   8 cm laceration forehead/scalp after tripping when mowing her lawn.   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   Ovarian teratoma, right 09/2000   Oophorectomy   Sleep  apnea    unable to tolreate cpap   Wisdom teeth extracted    Past Surgical History:  Procedure Laterality Date   ABDOMINAL HYSTERECTOMY  09/2000    Total hysterectomy and oophorectomy   ABDOMINAL WOUND DEHISCENCE Bilateral    multiple times / mesh use with multiple abdominal hernia repair   CARDIOVERSION  2014   Unsuccessful.  Patient reports cardiac ablation was not felt to be appropriate next step by cardiology.   CHOLECYSTECTOMY  04/1998   '98 -Lapraroscopic   COLON RESECTION  2001   "colon cancer"   FOOT SURGERY Bilateral    multiple "hammer toes and bunionectomies"   KNEE ARTHROSCOPY Left 08/2006   meniscectomy   MELANOMA EXCISION     near scapula   TOTAL KNEE ARTHROPLASTY Right 01/24/2016   Procedure: RIGHT TOTAL KNEE ARTHROPLASTY;  Surgeon: Ollen Gross, MD;  Location: [facility];  Service: Orthopedics;  Laterality: Right;    Social History   Tobacco Use   Smoking status: Former    Current packs/day: 0.00    Types: Cigarettes    Start date: 01/18/1972    Quit date: 01/17/1997    Years since quitting: 26.5   Smokeless tobacco: Never  Substance Use Topics   Alcohol use: No   Marital Status: Single  ROS  Review of Systems  Cardiovascular:  Positive for dyspnea on exertion and leg swelling. Negative for chest pain.  Gastrointestinal:  Negative for  melena.  Neurological:  Positive for disturbances in coordination.   Objective  Blood pressure 93/66, pulse 78, resp. rate 16, height 5\' 8"  (1.727 m), weight 280 lb (127 kg), SpO2 98%. Body mass index is 42.57 kg/m.     08/10/2023    2:02 PM 07/27/2023    1:01 PM 07/24/2023   10:25 AM  Vitals with BMI  Height 5\' 8"  5\' 8"    Weight 280 lbs 273 lbs 281 lbs 6 oz  BMI 42.58 41.52 42.8  Systolic 93 114 131  Diastolic 66 70 74  Pulse 78 74 90     Physical Exam Constitutional:      Appearance: She is morbidly obese.  Neck:     Vascular: Carotid bruit (right carotid bruit) present.  Cardiovascular:     Rate and Rhythm:  Normal rate. Rhythm irregular.     Pulses: Normal pulses and intact distal pulses.     Heart sounds: No murmur heard.    No gallop. No S3 or S4 sounds.  Pulmonary:     Effort: Pulmonary effort is normal.     Breath sounds: Normal breath sounds.  Abdominal:     General: Bowel sounds are normal.     Palpations: Abdomen is soft.  Musculoskeletal:     Right lower leg: Edema (2 + pitting edema) present.     Left lower leg: Edema (2 + pitting edema) present.    Laboratory examination:   Lab Results  Component Value Date   NA 137 07/18/2023   K 3.5 07/18/2023   CO2 24 07/18/2023   GLUCOSE 126 (H) 07/18/2023   BUN 37 (H) 07/18/2023   CREATININE 1.25 (H) 07/18/2023   CALCIUM 8.9 07/18/2023   GFRNONAA 45 (L) 07/18/2023       Latest Ref Rng & Units 07/18/2023   11:37 AM 07/09/2023   11:11 AM 02/21/2023   10:09 AM  CMP  Glucose 70 - 99 mg/dL 629  528  413   BUN 8 - 23 mg/dL 37  32  26   Creatinine 0.44 - 1.00 mg/dL 2.44  0.10  2.72   Sodium 135 - 145 mmol/L 137  136  137   Potassium 3.5 - 5.1 mmol/L 3.5  3.7  3.7   Chloride 98 - 111 mmol/L 98  97  99   CO2 22 - 32 mmol/L 24  25  26    Calcium 8.9 - 10.3 mg/dL 8.9  9.4  8.4   Total Protein 6.0 - 8.3 g/dL  6.9  6.2   Total Bilirubin 0.2 - 1.2 mg/dL  1.9  1.0   Alkaline Phos 39 - 117 U/L  84  76   AST 0 - 37 U/L  24  19   ALT 0 - 35 U/L  19  15       Latest Ref Rng & Units 07/18/2023   11:37 AM 07/09/2023   11:11 AM 02/21/2023   10:09 AM  CBC  WBC 4.0 - 10.5 K/uL 7.4  7.4  8.1   Hemoglobin 12.0 - 15.0 g/dL 53.6  64.4  03.4   Hematocrit 36.0 - 46.0 % 41.2  45.4  41.4   Platelets 150 - 400 K/uL 151  147.0  143.0     Lipid Panel     Component Value Date/Time   CHOL 216 (H) 07/18/2023 1530   TRIG 213 (H) 07/18/2023 1530   HDL 39 (L) 07/18/2023 1530   CHOLHDL 5.5 07/18/2023 1530   VLDL 43 (H)  07/18/2023 1530   LDLCALC 134 (H) 07/18/2023 1530   LDLDIRECT 139.0 02/21/2023 1009    HEMOGLOBIN A1C Lab Results  Component Value  Date   HGBA1C 9.5 (H) 07/18/2023   MPG 225.95 07/18/2023   TSH Recent Labs    02/21/23 1009 07/09/23 1111  TSH 3.51 2.71   Last vitamin D Lab Results  Component Value Date   VD25OH 14.80 (L) 07/09/2023   Medications and allergies   Allergies  Allergen Reactions   Amoxicillin Shortness Of Breath, Swelling and Anaphylaxis    Has patient had a PCN reaction causing immediate rash, facial/tongue/throat swelling, SOB or lightheadedness with hypotension: Yes Has patient had a PCN reaction causing severe rash involving mucus membranes or skin necrosis: No Has patient had a PCN reaction that required hospitalization No Has patient had a PCN reaction occurring within the last 10 years: Yes If all of the above answers are "NO", then may proceed with Cephalosporin use.  Has patient had a PCN reaction causing immediate rash, facial/tongue/throat swelling, SOB or lightheadedness with hypotension: Yes Has patient had a PCN reaction causing severe rash involving mucus membranes or skin necrosis: No Has patient had a PCN reaction that required hospitalization No Has patient had a PCN reaction occurring within the last 10 years: Yes If all of the above answers are "NO", then may proceed with Cephalosporin use. Has patient had a PCN reaction causing immediate rash, facial/tongue/throat swelling, SOB or lightheadedness with hypotension: Yes Has patient had a PCN reaction causing severe rash involving mucus membranes or skin necrosis: No Has patient had a PCN reaction that required hospitalization No Has patient had a PCN reaction occurring within the last 10 years: Yes If all of the above answers are "NO", then may proceed with Cephalosporin use.    Ampicillin    Codeine Itching    Tolerates hydrocodone   Ezetimibe    Livalo [Pitavastatin]    Statins Other (See Comments)    Muscle skeletal side effects   Welchol [Colesevelam]     Gi se    Current Outpatient Medications:    allopurinol  (ZYLOPRIM) 100 MG tablet, Take 1 tablet (100 mg total) by mouth daily., Disp: 90 tablet, Rfl: 0   ALPRAZolam (XANAX) 0.25 MG tablet, Take 1 tablet (0.25 mg total) by mouth daily as needed for anxiety., Disp: 30 tablet, Rfl: 5   colchicine 0.6 MG tablet, 2 tabs at onset of flare, followed by 1 tab 2 hours later. Then continue 1 tab daily until flare is resolved., Disp: 30 tablet, Rfl: 5   Continuous Blood Gluc Transmit (DEXCOM G6 TRANSMITTER) MISC, USE AS DIRECTED, Disp: 1 each, Rfl: 3   Continuous Glucose Sensor (DEXCOM G6 SENSOR) MISC, Change every 10 days  DX E11.65, Disp: 9 each, Rfl: 3   dabigatran (PRADAXA) 150 MG CAPS capsule, Take 1 capsule (150 mg total) by mouth 2 (two) times daily., Disp: 200 capsule, Rfl: 3   EPIPEN 2-PAK 0.3 MG/0.3ML SOAJ injection, Inject 0.3 mg into the skin as needed. Bee stings, Disp: , Rfl: 1   Evolocumab (REPATHA SURECLICK) 140 MG/ML SOAJ, Inject 140 mg into the skin every 14 (fourteen) days., Disp: 6 mL, Rfl: 3   insulin aspart (FIASP FLEXTOUCH) 100 UNIT/ML FlexTouch Pen, Max daily 60 units, Disp: 60 mL, Rfl: 3   Insulin Glargine (BASAGLAR KWIKPEN) 100 UNIT/ML, Inject 46 Units into the skin every morning. (Patient taking differently: Inject 50 Units into the skin every morning.), Disp: 45 mL, Rfl: 3  Insulin Pen Needle (NOVOFINE PEN NEEDLE) 32G X 6 MM MISC, Inject 1 Device into the skin in the morning, at noon, in the evening, and at bedtime. Use as instructed to inject insulin, Disp: 400 each, Rfl: 3   Lancets (ONETOUCH DELICA PLUS LANCET33G) MISC, Apply 1 each topically 3 (three) times daily., Disp: , Rfl:    levothyroxine (SYNTHROID) 50 MCG tablet, Take 1 tablet (50 mcg total) by mouth daily before breakfast., Disp: 90 tablet, Rfl: 3   metoprolol tartrate (LOPRESSOR) 50 MG tablet, Take 1 tablet (50 mg total) by mouth 2 (two) times daily., Disp: 180 tablet, Rfl: 1   olmesartan-hydrochlorothiazide (BENICAR HCT) 40-25 MG tablet, Take 1 tablet by mouth daily., Disp:  90 tablet, Rfl: 1   ONETOUCH ULTRA test strip, SMARTSIG:Via Meter, Disp: , Rfl:    Semaglutide,0.25 or 0.5MG /DOS, 2 MG/3ML SOPN, Inject 0.5 mg into the skin once a week., Disp: 9 mL, Rfl: 3   triamcinolone ointment (KENALOG) 0.5 %, Apply topically 2 (two) times daily as needed., Disp: , Rfl:    vitamin B-1 (VITAMIN B1) 100 MG tablet, Take 2 tablets (200 mg total) by mouth daily for 7 days, THEN 1 tablet (100 mg total) daily., Disp: 100 tablet, Rfl: 1   Vitamin D, Ergocalciferol, (DRISDOL) 1.25 MG (50000 UNIT) CAPS capsule, 1 cap PO Monday and Thursday with food, Disp: 24 capsule, Rfl: 0   zolpidem (AMBIEN) 5 MG tablet, Take 1 tablet (5 mg total) by mouth at bedtime as needed., Disp: 30 tablet, Rfl: 5   Radiology:   MRI brain 07/18/2023: Acute to early subacute infarct in the left basal ganglia with petechial hemorrhage but no mass effect or midline shift.  CT angio head and neck 07/18/2023:  1. Aortic atherosclerosis. 2. Atherosclerotic disease at both carotid bifurcations. 25% stenosis of the proximal ICA on the right and 50% stenosis of the proximal ICA on the left. 3. 50% stenosis of the right vertebral artery origin. Severe stenosis of the proximal left subclavian artery. Occlusion of the proximal left vertebral artery with reconstituted flow in the upper cervical region due to cervical collaterals. 30% stenosis of the left vertebral artery at the foramen magnum. Severe focal stenosis of the left PCA at the P1 P2 junction. 4. Atherosclerotic disease in both carotid siphon regions with stenosis estimated at 30-50% on both sides.  Cardiac Studies:   Lexiscan Tetrofosmin stress test 05/25/2021: Lexiscan nuclear stress test performed using 1-day protocol. SPECT images show small sized, mild intensity, reversible perfusion defect in mid to basal inferolateral myocardium. Stress LVEF 73%. Low risk study.  Echocardiogram 05/25/2021: Left ventricle cavity is normal in size. Mild concentric  hypertrophy of the left ventricle. Normal LV systolic function with visual EF 55-60%. Normal global wall motion. Unable to evaluate diastolic function due to atrial fibrillation. Left atrial cavity is moderately dilated. Mild tricuspid regurgitation. Estimated pulmonary artery systolic pressure 32 mmHg. No significant change compared to previous study in 2019.  Carotid artery duplex 06/01/2021: Duplex suggests stenosis in the right internal carotid artery (<50%).  Duplex suggests stenosis in the right common carotid artery (<50%), probably minimal heterogenous plaque. Duplex suggests stenosis in the left internal carotid artery (<50%), probably minimal heterogenous plaque. Duplex suggests stenosis in the left external carotid artery (<50%). Antegrade right vertebral artery flow. Antegrade left vertebral artery flow. Follow up is appropriate if clinically indicated.  EKG:   EKG 08/10/2023: Atrial fibrillation with controlled ventricular sponsor at the rate of 64 beats minute, normal axis.  No  evidence of ischemia.  Compared to 01/31/2023, no significant change.  Assessment     ICD-10-CM   1. Permanent atrial fibrillation (HCC)  I48.21 EKG 12-Lead    2. Left basal ganglia embolic stroke (HCC)  I63.9 Evolocumab (REPATHA SURECLICK) 140 MG/ML SOAJ    3. Mixed hyperlipidemia  E78.2 Evolocumab (REPATHA SURECLICK) 140 MG/ML SOAJ    Evolocumab SOAJ 140 mg    4. Statin myopathy  G72.0    T46.6X5A     5. Stenosis of left subclavian artery (HCC)  I77.1       CHA2DS2-VASc Score is 6.  Yearly risk of stroke: 10% (A, Female, HTN, DM, CVA).  Score of 1=0.6; 2=2.2; 3=3.2; 4=4.8; 5=7.2; 6=9.8; 7=>9.8) -(CHF; HTN; vasc disease DM,  Female = 1; Age <65 =0; 65-74 = 1,  >75 =2; stroke/embolism= 2).    There are no discontinued medications.    Meds ordered this encounter  Medications   Evolocumab (REPATHA SURECLICK) 140 MG/ML SOAJ    Sig: Inject 140 mg into the skin every 14 (fourteen) days.     Dispense:  6 mL    Refill:  3   Evolocumab SOAJ 140 mg    Orders Placed This Encounter  Procedures   EKG 12-Lead   Recommendations:   ABIGIAL MANNE is a 72 y.o. Caucasian female patient with permanent atrial fibrillation, uncontrolled diabetes, hypertension, morbid obesity with OSA unable to tolerate CPAP, hyperlipidemia, unable to tolerate statins and also does not want to be on any lipid-lowering therapy.    Patient presented to the emergency room on 07/17/2022 with expressive aphasia, MRI revealing subacute stroke.  CT angiogram/MRI revealing moderate diffuse atherosclerotic changes in bilateral ICA constituting 25 to 50% stenosis and severe stenosis and near occlusion of the left vertebral artery and also left PCA and P1 and P2 junction stenosis.  She was also found to have severe stenosis in the left subclavian artery.  Stroke felt to be embolic due to patient not taking Pradaxa as recommended.  She now presents for follow-up.  1. Permanent atrial fibrillation [facility]) Patient with permanent atrial fibrillation, presently on Pradaxa now she is taking her medication as prescribed twice daily.  She is gradually recuperating from her dysarthria and generalized weakness. - EKG 12-Lead  2. Left basal ganglia embolic stroke (HCC) Felt to be cardioembolic. - Evolocumab (REPATHA SURECLICK) 140 MG/ML SOAJ; Inject 140 mg into the skin every 14 (fourteen) days.  Dispense: 6 mL; Refill: 3  3. Mixed hyperlipidemia Patient has hypercholesterolemia previously was reluctant to starting any lipid-lowering therapy, discussed regarding Repatha and she is willing to do this.  Administrations This Visit     Evolocumab SOAJ 140 mg     Admin Date 08/10/2023 Action Given Dose 140 mg Route Subcutaneous Documented By Mares, Lesley            - Evolocumab (REPATHA SURECLICK) 140 MG/ML SOAJ; Inject 140 mg into the skin every 14 (fourteen) days.  Dispense: 6 mL; Refill: 3 - Evolocumab SOAJ 140  mg  4. Statin myopathy Intolerant to statins due to severe myalgias and arthralgias making her crippled.  Advised her that she needs lipid profile testing in 4 to 6 weeks, goal LDL <70.  Diabetes continues to be uncontrolled, she has been started on Ozempic hopefully this should help as well.  I will see her back in 6 months for follow-up.   5.  Left subclavian artery stenosis/occlusion noted on CT scan She is asymptomatic with regard to subclavian stenosis  without symptoms of claudication or dizziness and syncope with vertebrobasilar insufficiency.  She already has occluded left vertebral artery as evidenced by CT angiogram done on 07/18/2023 and mild to moderate atherosclerotic changes in the carotid arteries. No indication for invasive strategy, continue medical therapy and risk modification only.  I will see her back in 6 months for follow-up.    Yates Decamp, MD, [facility] 08/10/2023, 4:31 PM Office: [phone]

## 2023-08-14 ENCOUNTER — Ambulatory Visit (INDEPENDENT_AMBULATORY_CARE_PROVIDER_SITE_OTHER): Payer: Medicare Other

## 2023-08-14 DIAGNOSIS — E538 Deficiency of other specified B group vitamins: Secondary | ICD-10-CM

## 2023-08-14 MED ORDER — CYANOCOBALAMIN 1000 MCG/ML IJ SOLN
1000.0000 ug | Freq: Once | INTRAMUSCULAR | Status: AC
Start: 2023-08-14 — End: 2023-08-14
  Administered 2023-08-14: 1000 ug via INTRAMUSCULAR

## 2023-08-14 NOTE — Progress Notes (Signed)
Pt here for 1/4 weekly B12 injection per Dr.Kuneff (B12 injections need to be started immediately once a week for 4 weeks, then injection every 2 weeks for 4 doses.)   B12 given IM, and pt tolerated injection well.     Please sign in absence of PCP

## 2023-08-16 ENCOUNTER — Ambulatory Visit: Payer: Medicare Other | Admitting: Physician Assistant

## 2023-08-17 ENCOUNTER — Other Ambulatory Visit: Payer: Self-pay | Admitting: Family Medicine

## 2023-08-23 ENCOUNTER — Other Ambulatory Visit: Payer: Self-pay

## 2023-08-23 DIAGNOSIS — E782 Mixed hyperlipidemia: Secondary | ICD-10-CM

## 2023-08-23 DIAGNOSIS — I639 Cerebral infarction, unspecified: Secondary | ICD-10-CM

## 2023-08-24 ENCOUNTER — Encounter: Payer: Self-pay | Admitting: Physician Assistant

## 2023-08-24 ENCOUNTER — Ambulatory Visit (INDEPENDENT_AMBULATORY_CARE_PROVIDER_SITE_OTHER): Payer: Medicare Other | Admitting: Physician Assistant

## 2023-08-24 VITALS — BP 90/69 | HR 67 | Resp 18 | Ht 68.0 in | Wt 269.0 lb

## 2023-08-24 DIAGNOSIS — R4701 Aphasia: Secondary | ICD-10-CM | POA: Insufficient documentation

## 2023-08-24 DIAGNOSIS — I6350 Cerebral infarction due to unspecified occlusion or stenosis of unspecified cerebral artery: Secondary | ICD-10-CM | POA: Insufficient documentation

## 2023-08-24 DIAGNOSIS — I6381 Other cerebral infarction due to occlusion or stenosis of small artery: Secondary | ICD-10-CM

## 2023-08-24 DIAGNOSIS — Z8673 Personal history of transient ischemic attack (TIA), and cerebral infarction without residual deficits: Secondary | ICD-10-CM | POA: Insufficient documentation

## 2023-08-24 NOTE — Progress Notes (Signed)
Assessment/Plan:    The patient is seen in neurologic consultation at the request of Kuneff, Renee A, DO for the evaluation of speech difficulties Ann [name] is a very pleasant 72 y.o. year old RH female with a history of hypertension, hyperlipidemia, A-fib noncompliant with anticoagulation, history of colon cancer in 2001, hypothyroidism, B1 and B12 deficiency, vitamin D deficiency, history of melanoma status post wide excision near scapula, OSA unable to tolerate CPAP, expressive aphasia following a recent basal ganglia subacute stroke on 08/03/2023, permanent atrial fibrillation not compliant with the second dose of Pradaxa until recently.  She reports that her aphasia is better, but has not been resolved.  She denies any other strokelike symptoms.  She does have during this visit some difficulties expressing her words, or at times takes her a while to keep the correct answer.  She denies any memory issues.  In fact, her MoCA today is 28/30.  She will likely perform better if she were to be able to produce more words during the fluency test.  She is able to participate in her ADLs.  She drives without any difficulties.  Recent basal ganglia subacute stroke Expressive aphasia, improved  Patient presented with expressive aphasia, and MRI of the brain without contrast was remarkable for subacute stroke.  CT angio of the head and neck showed moderate diffuse atherosclerotic changes in the bilateral ICA (25 to 50% stenosis) and severe stenosis and near occlusion of the left vertebral artery, also left PCA P1 and P2 junction stenosis.  She also was found to have severe stenosis of the left subclavian artery.  The stroke felt to be embolic due to patient not being compliant with Pradaxa twice daily, she was only using it daily, which was not the way it was prescribed by cardiology.     Recommend adherence to anticoagulation with Pradaxa twice daily Continue Repatha as per cardiology Monitor other  cardiovascular risk factors, including blood pressure and diabetes.  Patient recently started on Ozempic Follow-up with cardiology. Continue secondary stroke prevention, goal BP less than 130/90 Left subclavian artery stenosis and occlusion noted on CT angio of 07/18/2023, asymptomatic for dizziness or syncope or claudication, to continue on medical therapy, without indication for invasive strategy as per cardiology recommendations. Recommend speech therapy referral due to recent expressive aphasia due to stroke Increase activity as tolerated.  Patient is to see her orthopedist at some point, for physical therapy due to knee arthritis Continue B1 and B12, vitamin D replenishment [place] up in  6 months   Subjective:    The patient is here alone.  How long did patient have memory difficulties?  Patient denies any memory difficulties.  She reports inability to express the words.  "I knew it was a stroke but waited 2 weeks before going to the hospital to be seen. I do not have any memory issues.  ".I can't tell you what words, it can be a simple word, basic word or complicated word .  repeats oneself?  Denies  Disoriented when walking into a room?  Patient denies   Leaving objects in unusual places?  denies   Wandering behavior? denies   Any personality changes ? Denies. Any history of depression?: denies. Hallucinations or paranoia? Denies.   Seizures? Denies.    Any sleep changes?  Sleeps well .Denies vivid dreams, REM behavior or sleepwalking   Sleep apnea? denies   Any hygiene concerns?  Denies.   Independent of bathing and dressing? Endorsed  Does the  patient need help with medications? Patient is in charge.   Who is in charge of the finances?  Patient is in charge     Any changes in appetite?   Decreased.       Patient have trouble swallowing?  Denies.   Does the patient cook? Yes, denies any issues Any headaches?  Denies.   Chronic pain? Denies.   Ambulates with difficulty?  Endorsed, due to L knee arthritis. Sometimes she uses the cane for stability. Did PT until the stroke.   Recent falls or head injuries? denies     Vision changes? Denies  Unilateral weakness, numbness or tingling? Denies.  Denies any history of TIA. Denies vertigo dizziness or vision changes. Denies headaches, dysarthria or dysphagia. No confusion or seizures. Denies any chest pain, or shortness of breath. Denies any fever or chills, or night sweats. No tobacco. No new meds or hormonal supplements. Denies any recent long distance trips . No sick contacts. No new stressors present in personal life. Patient was not very active  Strong family history of stroke   Any tremors? denies   Any anosmia? Denies.   Any incontinence of urine? Denies.   Any bowel dysfunction?  She had recent diarrhea. She has a history of AdCa Colon at [facility] in 2001, s/p resection and chemo     Patient lives alone   History of heavy alcohol intake? Denies.   History of heavy tobacco use? Denies.   Family history of dementia? Denies  Does patient drive? No   I retired Garment/textile technologist. Retired at age 20   MRI brain 07/18/2023, personally reviewed: Chronic small vessel ischemic changes, mild for age Mild L>R cerebral atrophy Acute to early subacute infarct in the left basal ganglia with petechial hemorrhage but no mass effect or midline shift.   CT angio head and neck 07/18/2023:  1. Aortic atherosclerosis. 2. Atherosclerotic disease at both carotid bifurcations. 25% stenosis of the proximal ICA on the right and 50% stenosis of the proximal ICA on the left. 3. 50% stenosis of the right vertebral artery origin. Severe stenosis of the proximal left subclavian artery. Occlusion of the proximal left vertebral artery with reconstituted flow in the upper cervical region due to cervical collaterals. 30% stenosis of the left vertebral artery at the foramen magnum. Severe focal stenosis of the left PCA at the P1 P2 junction. 4.  Atherosclerotic disease in both carotid siphon regions with stenosis estimated at 30-50% on both sides.  Lexiscan Tetrofosmin stress test 05/25/2021: Lexiscan nuclear stress test performed using 1-day protocol. SPECT images show small sized, mild intensity, reversible perfusion defect in mid to basal inferolateral myocardium. Stress LVEF 73%. Low risk study.   2 D Echo Left ventricle cavity is normal in size. Mild concentric hypertrophy of the left ventricle. Normal LV systolic function with visual EF 55-60%. Normal global wall motion. Unable to evaluate diastolic function due to atrial fibrillation. Left atrial cavity is moderately dilated. Mild tricuspid regurgitation. Estimated pulmonary artery systolic pressure 32 mmHg. No significant change compared to previous study in 2019.   Carotid artery duplex 06/01/2021: Duplex suggests stenosis in the right internal carotid artery (<50%).  Duplex suggests stenosis in the right common carotid artery (<50%), probably minimal heterogenous plaque. Duplex suggests stenosis in the left internal carotid artery (<50%), probably minimal heterogenous plaque. Duplex suggests stenosis in the left external carotid artery (<50%). Antegrade right vertebral artery flow. Antegrade left vertebral artery flow. Follow up is appropriate if clinically indicated.  EKG 08/10/2023: Atrial fibrillation with controlled ventricular sponsor at the rate of 64 beats minute, normal axis.  No evidence of ischemia.  Compared to 01/31/2023, no significant change.   Pertinent labs August 2024 A1c 9.5, elevated lipid panel with TC 216, TG 213, HDL 39, VLDL 43 LDL 134, normal CBC, creatinine 1.25, A1c less than 6 on July 2024, TSH 2.71, B12 161   Allergies  Allergen Reactions   Amoxicillin Shortness Of Breath, Swelling and Anaphylaxis    Has patient had a PCN reaction causing immediate rash, facial/tongue/throat swelling, SOB or lightheadedness with hypotension: Yes Has patient  had a PCN reaction causing severe rash involving mucus membranes or skin necrosis: No Has patient had a PCN reaction that required hospitalization No Has patient had a PCN reaction occurring within the last 10 years: Yes If all of the above answers are "NO", then may proceed with Cephalosporin use.  Has patient had a PCN reaction causing immediate rash, facial/tongue/throat swelling, SOB or lightheadedness with hypotension: Yes Has patient had a PCN reaction causing severe rash involving mucus membranes or skin necrosis: No Has patient had a PCN reaction that required hospitalization No Has patient had a PCN reaction occurring within the last 10 years: Yes If all of the above answers are "NO", then may proceed with Cephalosporin use. Has patient had a PCN reaction causing immediate rash, facial/tongue/throat swelling, SOB or lightheadedness with hypotension: Yes Has patient had a PCN reaction causing severe rash involving mucus membranes or skin necrosis: No Has patient had a PCN reaction that required hospitalization No Has patient had a PCN reaction occurring within the last 10 years: Yes If all of the above answers are "NO", then may proceed with Cephalosporin use.    Ampicillin    Codeine Itching    Tolerates hydrocodone   Ezetimibe    Livalo [Pitavastatin]    Statins Other (See Comments)    Muscle skeletal side effects   Welchol [Colesevelam]     Gi se    Current Outpatient Medications  Medication Instructions   allopurinol (ZYLOPRIM) 100 mg, Oral, Daily   ALPRAZolam (XANAX) 0.25 mg, Oral, Daily PRN   Basaglar KwikPen 46 Units, Subcutaneous, BH-each morning   colchicine 0.6 MG tablet 2 tabs at onset of flare, followed by 1 tab 2 hours later. Then continue 1 tab daily until flare is resolved.   Continuous Blood Gluc Transmit (DEXCOM G6 TRANSMITTER) MISC USE AS DIRECTED   Continuous Glucose Sensor (DEXCOM G6 SENSOR) MISC Change every 10 days  DX E11.65   dabigatran (PRADAXA) 150  mg, Oral, 2 times daily   EpiPen 2-Pak 0.3 mg, As needed   insulin aspart (FIASP FLEXTOUCH) 100 UNIT/ML FlexTouch Pen Max daily 60 units   Insulin Pen Needle (NOVOFINE PEN NEEDLE) 32G X 6 MM MISC 1 Device, Subcutaneous, 4 times daily, Use as instructed to inject insulin   Lancets (ONETOUCH DELICA PLUS LANCET33G) MISC 1 each, Topical, 3 times daily   levothyroxine (SYNTHROID) 50 mcg, Oral, Daily before breakfast   metoprolol tartrate (LOPRESSOR) 50 mg, Oral, 2 times daily   olmesartan-hydrochlorothiazide (BENICAR HCT) 40-25 MG tablet 1 tablet, Oral, Daily   ONETOUCH ULTRA test strip SMARTSIG:Via Meter   Repatha SureClick 140 mg, Subcutaneous, Every 14 days   Semaglutide(0.25 or 0.5MG /DOS) 0.5 mg, Subcutaneous, Weekly   triamcinolone ointment (KENALOG) 0.5 % Topical, 2 times daily PRN   vitamin B-1 (VITAMIN B1) 100 MG tablet Take 2 tablets (200 mg total) by mouth daily for 7 days, THEN  1 tablet (100 mg total) daily.   Vitamin D, Ergocalciferol, (DRISDOL) 1.25 MG (50000 UNIT) CAPS capsule 1 CAP ON MONDAY AND THURSDAY WITH FOOD   zolpidem (AMBIEN) 5 mg, Oral, At bedtime PRN     VITALS:   Vitals:   08/24/23 0751  BP: 90/69  Pulse: 67  Resp: 18  SpO2: 98%  Weight: 269 lb (122 kg)  Height: 5\' 8"  (1.727 m)      PHYSICAL EXAM   HEENT:  Normocephalic, atraumatic.  The superficial temporal arteries are without ropiness or tenderness. Cardiovascular: Irregular rate and rhythm. Lungs: Clear to auscultation bilaterally. Neck: No bruits   NEUROLOGICAL:    08/24/2023    9:00 AM  Montreal Cognitive Assessment   Visuospatial/ Executive (0/5) 5  Naming (0/3) 3  Attention: Read list of digits (0/2) 2  Attention: Read list of letters (0/1) 1  Attention: Serial 7 subtraction starting at 100 (0/3) 3  Language: Repeat phrase (0/2) 2  Language : Fluency (0/1) 0  Abstraction (0/2) 2  Delayed Recall (0/5) 4  Orientation (0/6) 6  Total 28  Adjusted Score (based on education) 28        No  data to display           Orientation:  Alert and oriented to person, place and time. Mild aphasia, no dysarthria. Fund of knowledge is appropriate. Recent and remote memory impaired.  Attention and concentration are normal . Able to name objects and repeat phrases. Delayed recall 4/5 Cranial nerves: There is good facial symmetry. Extraocular muscles are intact and visual fields are full to confrontational testing. Speech is not very fluent but clear. No tongue deviation. Hearing is intact to conversational tone.  Tone: Tone is good throughout. Sensation: Sensation is intact to light touch.  Vibration is intact at the bilateral big toe.  Coordination: The patient has no difficulty with RAM's or FNF bilaterally. Normal FNT Motor: Strength is 5/5 in the bilateral upper and lower extremities. There is no pronator drift. There are no fasciculations noted. DTR's: Deep tendon reflexes are 1/4 bilaterally. Gait and Station: The patient is able to ambulate with some difficulty due to arthritis, L knee pain. Gait is cautious and narrow. Stride length is normal         Thank you for allowing [place] the opportunity to participate in the care of this nice patient. Please do not hesitate to contact us for any questions or concerns.   Total time spent on today's visit was 60 minutes dedicated to this patient today, preparing to see patient, examining the patient, ordering tests and/or medications and counseling the patient, documenting clinical information in the EHR or other health record, independently interpreting results and communicating results to the patient/family, discussing treatment and goals, answering patient's questions and coordinating care.  Cc:  Natalia Leatherwood, DO  [facility] 08/24/2023 10:14 AM

## 2023-08-24 NOTE — Patient Instructions (Addendum)
Follow up in 6 months  Continue Pradaxa 2 times a day Follow up with Cardiology, recommend secondary stroke prevention less than 130/90   Referral to ST  Continue B1 and B12 and D replenishment  Increase activity

## 2023-08-27 ENCOUNTER — Encounter: Payer: Self-pay | Admitting: Physician Assistant

## 2023-08-28 ENCOUNTER — Other Ambulatory Visit: Payer: Self-pay | Admitting: Family Medicine

## 2023-08-28 ENCOUNTER — Ambulatory Visit (INDEPENDENT_AMBULATORY_CARE_PROVIDER_SITE_OTHER): Payer: Medicare Other

## 2023-08-28 DIAGNOSIS — E538 Deficiency of other specified B group vitamins: Secondary | ICD-10-CM

## 2023-08-28 MED ORDER — CYANOCOBALAMIN 1000 MCG/ML IJ SOLN
1000.0000 ug | Freq: Once | INTRAMUSCULAR | Status: AC
Start: 2023-08-28 — End: 2023-08-28
  Administered 2023-08-28: 1000 ug via INTRAMUSCULAR

## 2023-08-28 NOTE — Progress Notes (Signed)
Pt here for biweekly B12 injection per Dr. Claiborne Billings  B12 given IM, and pt tolerated injection well.  Next B12 injection scheduled for 2 weeks

## 2023-08-29 ENCOUNTER — Ambulatory Visit (INDEPENDENT_AMBULATORY_CARE_PROVIDER_SITE_OTHER): Payer: Medicare Other | Admitting: Vascular Surgery

## 2023-08-29 ENCOUNTER — Encounter: Payer: Self-pay | Admitting: Vascular Surgery

## 2023-08-29 VITALS — BP 116/86 | HR 101 | Temp 97.8°F | Ht 68.0 in | Wt 269.6 lb

## 2023-08-29 DIAGNOSIS — I6522 Occlusion and stenosis of left carotid artery: Secondary | ICD-10-CM

## 2023-08-29 NOTE — Progress Notes (Signed)
Patient ID: Ann [name], female   DOB: 1951/02/03, 72 y.o.   MRN: [id number]  Reason for Consult: No chief complaint on file.   Referred by Natalia Leatherwood, DO  Subjective:     HPI:  Ann [name] is a 72 y.o. female with patient on Pradaxa.  She recently had an episode of expressive aphasia was found to have basal ganglia subacute stroke in August of this year.  At that time she was not compliant with her Pradaxa was having expressive aphasia which has resolved.  She does not have any upper or lower extremity weakness related to the recent stroke.  CTA did demonstrate stenosis of her internal carotid arteries bilaterally.  She has never had any carotid interventions.  She is on Repatha as well as Pradaxa.  She does have known left subclavian artery occlusion but remains asymptomatic without dizziness or syncope. Her chief complaint today is lower extremity swelling which have been worse since she has had chigger bites.  She denies any previous interventions of her lower extremities.  Past Medical History:  Diagnosis Date   Arthritis    osteoarthritis- knees.   Atrial fibrillation (HCC)    Dr. Maisie Fus status post cardioversion chronic anticoagulation   Cellulitis of right lower extremity 12/30/2020   Chicken pox    Colon cancer (HCC) 2001   Radiation, chemo.  Adenocarcinoma of the colon.  Followed by Dr. Kennith Center   Complication of anesthesia    sensitive to narcotics"extra sleepy". Versed- "no memory recall for 4 days postop"   Diabetes mellitus without complication (HCC)    oral and Insulin   Hepatitis    "sub clinical case of Hepatitis in late 70's""has immunities"   Hypertension    Hypothyroidism    Laceration of head 08/04/2019   8 cm laceration forehead/scalp after tripping when mowing her lawn.   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   Ovarian teratoma, right  09/2000   Oophorectomy   Sleep apnea    unable to tolreate cpap   Wisdom teeth extracted    Family History  Problem Relation Age of Onset   Arthritis Mother    Colon cancer Mother    Diabetes Mother    Atrial fibrillation Mother    Diabetes Father    Heart attack Father    Heart disease Father    Stroke Father    Hypertension Maternal Grandmother    Stroke Maternal Grandmother    Stroke Maternal Grandfather    Hypertension Paternal Grandmother    Stroke Paternal Grandmother    Diabetes Paternal Grandfather    Hypertension Paternal Grandfather    Stroke Paternal Grandfather    Diabetes Brother    Brain cancer Brother    Heart disease Brother    Hypertension Brother    Stroke Brother    Past Surgical History:  Procedure Laterality Date   ABDOMINAL HYSTERECTOMY  09/2000    Total hysterectomy and oophorectomy   ABDOMINAL WOUND DEHISCENCE Bilateral    multiple times / mesh use with multiple abdominal hernia repair   CARDIOVERSION  2014   Unsuccessful.  Patient reports cardiac ablation was not felt to be appropriate next step by cardiology.   CHOLECYSTECTOMY  04/1998   '98 -Lapraroscopic   COLON RESECTION  2001   "colon cancer"   FOOT SURGERY Bilateral    multiple "hammer toes  and bunionectomies"   KNEE ARTHROSCOPY Left 08/2006   meniscectomy   MELANOMA EXCISION     near scapula   TOTAL KNEE ARTHROPLASTY Right 01/24/2016   Procedure: RIGHT TOTAL KNEE ARTHROPLASTY;  Surgeon: Ollen Gross, MD;  Location: [facility];  Service: Orthopedics;  Laterality: Right;    Short Social History:  Social History   Tobacco Use   Smoking status: Former    Current packs/day: 0.00    Types: Cigarettes    Start date: 01/18/1972    Quit date: 01/17/1997    Years since quitting: 26.6   Smokeless tobacco: Never  Substance Use Topics   Alcohol use: No    Allergies  Allergen Reactions   Amoxicillin Shortness Of Breath, Swelling and Anaphylaxis    Has patient had a PCN reaction causing  immediate rash, facial/tongue/throat swelling, SOB or lightheadedness with hypotension: Yes Has patient had a PCN reaction causing severe rash involving mucus membranes or skin necrosis: No Has patient had a PCN reaction that required hospitalization No Has patient had a PCN reaction occurring within the last 10 years: Yes If all of the above answers are "NO", then may proceed with Cephalosporin use.  Has patient had a PCN reaction causing immediate rash, facial/tongue/throat swelling, SOB or lightheadedness with hypotension: Yes Has patient had a PCN reaction causing severe rash involving mucus membranes or skin necrosis: No Has patient had a PCN reaction that required hospitalization No Has patient had a PCN reaction occurring within the last 10 years: Yes If all of the above answers are "NO", then may proceed with Cephalosporin use. Has patient had a PCN reaction causing immediate rash, facial/tongue/throat swelling, SOB or lightheadedness with hypotension: Yes Has patient had a PCN reaction causing severe rash involving mucus membranes or skin necrosis: No Has patient had a PCN reaction that required hospitalization No Has patient had a PCN reaction occurring within the last 10 years: Yes If all of the above answers are "NO", then may proceed with Cephalosporin use.    Ampicillin    Codeine Itching    Tolerates hydrocodone   Ezetimibe    Livalo [Pitavastatin]    Statins Other (See Comments)    Muscle skeletal side effects   Welchol [Colesevelam]     Gi se    Current Outpatient Medications  Medication Sig Dispense Refill   allopurinol (ZYLOPRIM) 100 MG tablet Take 1 tablet (100 mg total) by mouth daily. 90 tablet 0   ALPRAZolam (XANAX) 0.25 MG tablet Take 1 tablet (0.25 mg total) by mouth daily as needed for anxiety. 30 tablet 5   colchicine 0.6 MG tablet 2 tabs at onset of flare, followed by 1 tab 2 hours later. Then continue 1 tab daily until flare is resolved. 30 tablet 5    Continuous Blood Gluc Transmit (DEXCOM G6 TRANSMITTER) MISC USE AS DIRECTED 1 each 3   Continuous Glucose Sensor (DEXCOM G6 SENSOR) MISC Change every 10 days  DX E11.65 9 each 3   dabigatran (PRADAXA) 150 MG CAPS capsule Take 1 capsule (150 mg total) by mouth 2 (two) times daily. 200 capsule 3   EPIPEN 2-PAK 0.3 MG/0.3ML SOAJ injection Inject 0.3 mg into the skin as needed. Bee stings (Patient not taking: Reported on 08/24/2023)  1   Evolocumab (REPATHA SURECLICK) 140 MG/ML SOAJ Inject 140 mg into the skin every 14 (fourteen) days. 6 mL 3   insulin aspart (FIASP FLEXTOUCH) 100 UNIT/ML FlexTouch Pen Max daily 60 units 60 mL 3   Insulin Glargine (  BASAGLAR KWIKPEN) 100 UNIT/ML Inject 46 Units into the skin every morning. (Patient taking differently: Inject 50 Units into the skin every morning.) 45 mL 3   Insulin Pen Needle (NOVOFINE PEN NEEDLE) 32G X 6 MM MISC Inject 1 Device into the skin in the morning, at noon, in the evening, and at bedtime. Use as instructed to inject insulin 400 each 3   Lancets (ONETOUCH DELICA PLUS LANCET33G) MISC Apply 1 each topically 3 (three) times daily.     levothyroxine (SYNTHROID) 50 MCG tablet Take 1 tablet (50 mcg total) by mouth daily before breakfast. 90 tablet 3   metoprolol tartrate (LOPRESSOR) 50 MG tablet Take 1 tablet (50 mg total) by mouth 2 (two) times daily. 180 tablet 1   olmesartan-hydrochlorothiazide (BENICAR HCT) 40-25 MG tablet Take 1 tablet by mouth daily. 90 tablet 1   ONETOUCH ULTRA test strip SMARTSIG:Via Meter     Semaglutide,0.25 or 0.5MG /DOS, 2 MG/3ML SOPN Inject 0.5 mg into the skin once a week. 9 mL 3   triamcinolone ointment (KENALOG) 0.5 % Apply topically 2 (two) times daily as needed.     vitamin B-1 (VITAMIN B1) 100 MG tablet Take 2 tablets (200 mg total) by mouth daily for 7 days, THEN 1 tablet (100 mg total) daily. 100 tablet 1   Vitamin D, Ergocalciferol, (DRISDOL) 1.25 MG (50000 UNIT) CAPS capsule 1 CAP ON MONDAY AND THURSDAY WITH FOOD 24  capsule 0   zolpidem (AMBIEN) 5 MG tablet Take 1 tablet (5 mg total) by mouth at bedtime as needed. 30 tablet 5   No current facility-administered medications for this visit.    Review of Systems  Constitutional: Positive for fatigue.  HENT: HENT negative.  Eyes: Eyes negative.  Cardiovascular: Positive for leg swelling.  GI: Gastrointestinal negative.  Musculoskeletal: Positive for gait problem and leg pain.  Hematologic: Hematologic/lymphatic negative.  Psychiatric: Psychiatric negative.        Objective:  Objective  Vitals:   08/29/23 1123 08/29/23 1125  BP: 95/63 116/86  Pulse: (!) 101   Temp: 97.8 F (36.6 C)   SpO2: 92%      Physical Exam HENT:     Nose: Nose normal.  Eyes:     Pupils: Pupils are equal, round, and reactive to light.  Neck:     Vascular: No carotid bruit.  Cardiovascular:     Rate and Rhythm: Normal rate.  Pulmonary:     Effort: Pulmonary effort is normal.  Abdominal:     General: Abdomen is flat.  Musculoskeletal:     Right lower leg: Edema present.     Left lower leg: Edema present.  Skin:    General: Skin is dry.     Capillary Refill: Capillary refill takes less than 2 seconds.  Neurological:     General: No focal deficit present.     Mental Status: She is alert.     Data:  CTA IMPRESSION: 1. Aortic atherosclerosis. 2. Atherosclerotic disease at both carotid bifurcations. 25% stenosis of the proximal ICA on the right and 50% stenosis of the proximal ICA on the left. 3. 50% stenosis of the right vertebral artery origin. Severe stenosis of the proximal left subclavian artery. Occlusion of the proximal left vertebral artery with reconstituted flow in the upper cervical region due to cervical collaterals. 30% stenosis of the left vertebral artery at the foramen magnum. Severe focal stenosis of the left PCA at the P1 P2 junction. 4. Atherosclerotic disease in both carotid siphon regions with stenosis  estimated at 30-50% on both  sides.  MRI IMPRESSION: Acute to early subacute infarct in the left basal ganglia with petechial hemorrhage but no mass effect or midline shift.      Assessment/Plan:     72 year old female with recent lacunar infarct on the left with 50% heavily calcified stenosis by CT angio which may very well be overestimated given that is entirely calcified and there appears to be no evidence of thrombus there.  As such I have recommended continued Pradaxa and consideration of addition of aspirin.  She did receive injection of Repatha and will need continued statin therapy if possible.  I recommended follow-up here in 6 months with carotid duplex for further evaluation of what is currently considered an asymptomatic carotid lesion on the left with minimal stenosis on the right.  We can also evaluate her for venous reflux in the future if her swelling persist but I have recommended compression stockings as tolerated at this time.     Maeola Harman MD [facility]

## 2023-08-31 ENCOUNTER — Other Ambulatory Visit: Payer: Self-pay

## 2023-08-31 DIAGNOSIS — I6522 Occlusion and stenosis of left carotid artery: Secondary | ICD-10-CM

## 2023-09-03 ENCOUNTER — Ambulatory Visit: Payer: Medicare Other | Attending: Physician Assistant

## 2023-09-03 ENCOUNTER — Other Ambulatory Visit: Payer: Self-pay

## 2023-09-03 DIAGNOSIS — R482 Apraxia: Secondary | ICD-10-CM | POA: Insufficient documentation

## 2023-09-03 DIAGNOSIS — I6381 Other cerebral infarction due to occlusion or stenosis of small artery: Secondary | ICD-10-CM | POA: Insufficient documentation

## 2023-09-03 DIAGNOSIS — I6350 Cerebral infarction due to unspecified occlusion or stenosis of unspecified cerebral artery: Secondary | ICD-10-CM | POA: Diagnosis not present

## 2023-09-03 DIAGNOSIS — R4701 Aphasia: Secondary | ICD-10-CM | POA: Insufficient documentation

## 2023-09-03 NOTE — Patient Instructions (Signed)
Speech Exercises  Repeat these phrases 2 times, 2 times a day  Call the cat "Buttercup" A calendar of [place] Four floors to cover Yellow oil ointment Fellow lovers of felines Catastrophe in [place] Plump plumbers' plums The church's chimes chimed Telling time 'til eleven Five valve levers Keep the gate closed Go see that guy Fat cows give milk Automatic Data Gophers Fat frogs flip freely [employer] into bed Get that game to [employer] Thick thistles stick together Cinnamon aluminum linoleum Black bugs blood Lovely lemon linament Red leather, yellow leather  Big grocery buggy    Purple baby carriage [facility] Proper copper coffee pot Ripe purple cabbage Three free throws [employer] tackled  [employer] dipped the dessert  [employer] Buckle that [employer] Shirts shrink, shells shouldn't [place] [employer] Take the tackle box File the flash message Give me five flapjacks Fundamental relatives Dye the pets purple Talking [place] time after time Dark chocolate chunks Political landscape of the kingdom Actuary genius We played yo-yos yesterday

## 2023-09-03 NOTE — Therapy (Signed)
OUTPATIENT SPEECH LANGUAGE PATHOLOGY EVALUATION   Patient Name: Ann [name] MRN: [id number] DOB:04/20/1951, 72 y.o., female Today's Date: 09/03/2023  PCP: Felix Pacini, DO REFERRING PROVIDER: Durenda Age, PA-C  END OF SESSION:  End of Session - 09/03/23 1300     Visit Number 1    Number of Visits 7    Date for SLP Re-Evaluation 10/19/23    SLP Start Time 1055    SLP Stop Time  1143    SLP Time Calculation (min) 48 min    Activity Tolerance Patient tolerated treatment well             Past Medical History:  Diagnosis Date   Arthritis    osteoarthritis- knees.   Atrial fibrillation (HCC)    Dr. Maisie Fus status post cardioversion chronic anticoagulation   Cellulitis of right lower extremity 12/30/2020   Chicken pox    Colon cancer (HCC) 2001   Radiation, chemo.  Adenocarcinoma of the colon.  Followed by Dr. Kennith Center   Complication of anesthesia    sensitive to narcotics"extra sleepy". Versed- "no memory recall for 4 days postop"   Diabetes mellitus without complication (HCC)    oral and Insulin   Hepatitis    "sub clinical case of Hepatitis in late 70's""has immunities"   Hypertension    Hypothyroidism    Laceration of head 08/04/2019   8 cm laceration forehead/scalp after tripping when mowing her lawn.   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   Ovarian teratoma, right 09/2000   Oophorectomy   Sleep apnea    unable to tolreate cpap   Wisdom teeth extracted    Past Surgical History:  Procedure Laterality Date   ABDOMINAL HYSTERECTOMY  09/2000    Total hysterectomy and oophorectomy   ABDOMINAL WOUND DEHISCENCE Bilateral    multiple times / mesh use with multiple abdominal hernia repair   CARDIOVERSION  2014   Unsuccessful.  Patient reports cardiac ablation was not felt to be appropriate next step by cardiology.   CHOLECYSTECTOMY  04/1998   '98  -Lapraroscopic   COLON RESECTION  2001   "colon cancer"   FOOT SURGERY Bilateral    multiple "hammer toes and bunionectomies"   KNEE ARTHROSCOPY Left 08/2006   meniscectomy   MELANOMA EXCISION     near scapula   TOTAL KNEE ARTHROPLASTY Right 01/24/2016   Procedure: RIGHT TOTAL KNEE ARTHROPLASTY;  Surgeon: Ollen Gross, MD;  Location: [facility];  Service: Orthopedics;  Laterality: Right;   Patient Active Problem List   Diagnosis Date Noted   Expressive aphasia 08/24/2023   Cerebrovascular accident (CVA) due to stenosis of cerebral artery (HCC) 08/24/2023   Basal ganglia stroke (HCC) 07/24/2023   Vertebral artery occlusion, left 07/24/2023   Subclavian artery stenosis (HCC) 07/24/2023   Left carotid artery stenosis 07/24/2023   Internal carotid artery stenosis, bilateral-25% right and 50% left proximal ICA stenosis 07/24/2023   Aortic atherosclerosis (HCC) 07/23/2023   Coronary artery calcification of native artery 07/23/2023   Cardiomegaly 07/23/2023   Diverticulosis 07/23/2023   Thiamine deficiency 07/13/2023   FH: stroke 07/09/2023   Unintentional weight loss 07/09/2023   Word finding difficulty 07/09/2023   Diarrhea 07/09/2023   Vitamin D deficiency 07/09/2023   Memory changes 07/09/2023   Type 2 diabetes mellitus with hyperglycemia, with long-term current use of insulin (HCC) 08/25/2022   Morbid obesity (HCC)  09/12/2021   Elevated bilirubin 03/22/2021   Gout 07/23/2020   Sleep disturbance 04/20/2020   Anxiety 04/20/2020   Atrial fibrillation (HCC)    Type 2 diabetes mellitus with hyperlipidemia (HCC)    Hypertension    Hypothyroidism    Chronic vulvitis 11/13/2018   OA (osteoarthritis) of knee 01/24/2016   Mitral valve disease 08/16/2012    ONSET DATE: August 2024   REFERRING DIAG:  I63.50 (ICD-10-CM) - Cerebrovascular accident (CVA) due to stenosis of cerebral artery (HCC)  I63.81 (ICD-10-CM) - Basal ganglia stroke (HCC)  R47.01 (ICD-10-CM) - Expressive aphasia     THERAPY DIAG:  Aphasia  Verbal apraxia  Rationale for Evaluation and Treatment: Rehabilitation  SUBJECTIVE:   SUBJECTIVE STATEMENT: "I don't find myself wanting to talk in conversations. But that may be because I feel like I'm recovering." "The words are in my brain, but I won't be able to bring them to my mouth."  Pt accompanied by: self  PERTINENT HISTORY: hypertension, hyperlipidemia, A-fib noncompliant with anticoagulation, history of colon cancer in 2001, hypothyroidism, B1 and B12 deficiency, vitamin D deficiency, history of melanoma status post wide excision near scapula, OSA unable to tolerate CPAP  PAIN:  Are you having pain? No  FALLS: Has patient fallen in last 6 months?  No  LIVING ENVIRONMENT: Lives with: lives alone Lives in: House/apartment  PLOF:  Level of assistance: Independent with ADLs, Independent with IADLs Employment: Retired  PATIENT GOALS: "I don't know what to expect."  OBJECTIVE:   DIAGNOSTIC FINDINGS:  MRI HEAD WITHOUT AND WITH CONTRAST  07/18/23 CLINICAL DATA:  Altered mental status, word finding difficulty for 6 weeks.   TECHNIQUE: Multiplanar, multiecho pulse sequences of the brain and surrounding structures were obtained without and with intravenous contrast.   CONTRAST:  10 cc Vueway   COMPARISON:  None Available.   FINDINGS: Brain: There is diffusion restriction with associated FLAIR signal abnormality in the left caudate and corona radiata consistent with acute to early subacute infarct. T1 hyperintensity and ill-defined SWI signal dropout is consistent with petechial hemorrhage. There is no mass effect or midline shift.   There is no other evidence of acute infarct or intracranial hemorrhage. There is no extra-axial fluid collection.   Background parenchymal volume is normal for age. The ventricles are normal in size. Patchy FLAIR signal abnormality in the supratentorial white matter and pons likely reflects  underlying chronic small-vessel ischemic change, mild for age.   The pituitary and suprasellar region are normal. There is no mass lesion or abnormal enhancement.   Vascular: Normal flow voids.   Skull and upper cervical spine: Normal marrow signal.   Sinuses/Orbits: The paranasal sinuses are clear. The globes and orbits are unremarkable.   Other: The mastoid air cells and middle ear cavities are clear.   IMPRESSION: Acute to early subacute infarct in the left basal ganglia with petechial hemorrhage but no mass effect or midline shift.   Findings were initially discussed with the MRI technologist at the time of scan acquisition, who was advised to direct the patient to the emergency department. The findings were then discussed with EMS at approximately 11:00 am. Findings discussed with provider RENEE KUNEFF at 11:22 am.     COGNITION: Overall cognitive status: Within functional limits for tasks assessed Denies overt s/sx cognitive communication deficits.   AUDITORY COMPREHENSION: Overall auditory comprehension: Appears intact  READING COMPREHENSION: Intact (reported)  EXPRESSION: verbal  VERBAL EXPRESSION: Level of generative/spontaneous verbalization: conversation Repetition: Impaired: phrase and sentence Pragmatics: Appears intact  Comments: Semantic paraphasias only noted in phrase and sentence repetition Effective technique:  slowed speech rate was very helpful in eliminating s/sx of language or speech deficits  WRITTEN EXPRESSION:  Pt denies any difficulty  MOTOR SPEECH: Overall motor speech: impaired Level of impairment: Sentence and Conversation Respiration: thoracic breathing and diaphragmatic/abdominal breathing Phonation: normal Resonance: WFL Articulation: Impaired: sentence and conversation Intelligibility: Intelligible Motor planning: Impaired Motor speech errors: aware and inconsistent Interfering components:  none noted Effective technique: slow  rate  Errors were not noted in conversation however pt speaking at a slower rate and mildly halting nature, rarely, to mitigate errors. Aphasic errors were not noted in conversation, only in repetition tasks.  ORAL MOTOR EXAMINATION: Overall status: Impaired: Labial: Bilateral (Coordination) Lingual: Bilateral (Coordination) With more intricate oral motor assessment tasks, pt's movement was mildly halting in nature.     PATIENT REPORTED OUTCOME MEASURES (PROM): Communication Participation Item Bank: provided during next therapy session.   TODAY'S TREATMENT:                                                                                                                                         DATE:   09/03/23: SLP assisted and educated pt about how to complete a HEP with consonant-laden words. Pt had semantic paraphasias (mostly difficulty exchanging plurals/non-plural - e.g., valve levers) which she corrected 70% of the time. SLP had to provide initial mod A for deliberate/purposeful articulation, faded to rare min A.    PATIENT EDUCATION: Education details: procedure for HEP, results of eval: suspect aphasia with some apraxia possible, rationale for frequency and duration of ST, how ST will assist pt Person educated: Patient Education method: Explanation, Demonstration, and Verbal cues Education comprehension: verbalized understanding, returned demonstration, verbal cues required, and needs further education   GOALS: Goals reviewed with patient? Yes  SHORT TERM GOALS: Target date: 10/05/23  Pt will complete consonant-laden phrases (HEP) with 100% accuracy Baseline: Goal status: INITIAL  2.  Pt will complete 10 minutes simple-mod complex conversation using compensations in 2 sessions Baseline:  Goal status: INITIAL   LONG TERM GOALS: Target date: 11/05/23  Pt will complete 15 minutes simple-mod complex conversation using compensations in 2 sessions Baseline:  Goal status:  INITIAL  2.  Pt will complete consonant-laden phrases (HEP) with 100% accuracy in 2 sessions after 10/05/23 Baseline:  Goal status: INITIAL  3.  Pt will improve PROM score Baseline:  Goal status: INITIAL  4.  Pt will indicate she is more comfortable to engage in conversation than previous to ST Baseline:  Goal status: INITIAL   ASSESSMENT:  CLINICAL IMPRESSION: Patient is a 72 y.o. F who was seen today for assessment of speech and langauge following CVA in August 2024. "I hung out for a little while (pt's chart states two weeks) before going to the hospital." Today she demonstrates s/sx of aphasia, and of verbal apraxia. She denies  overt s/sx dysphagia, and states cognitive-communication skills are unchanged. SLP recommeded x1/week x6 weeks and pt agreed to four weeks, saying, "I don't know what to expect, I'll try 4 weeks."  OBJECTIVE IMPAIRMENTS: include aphasia and apraxia. These impairments are limiting patient from household responsibilities, ADLs/IADLs, and effectively communicating at home and in community. Factors affecting potential to achieve goals and functional outcome are cooperation/participation level. Patient will benefit from skilled SLP services to address above impairments and improve overall function.  REHAB POTENTIAL: Good  PLAN:  SLP FREQUENCY: 1x/week  SLP DURATION: 6 weeks, however pt scheduled 4 weeks  PLANNED INTERVENTIONS: Language facilitation, Internal/external aids, Oral motor exercises, Functional tasks, SLP instruction and feedback, Compensatory strategies, and Patient/family education    [facility], CCC-SLP 09/03/2023, 1:02 PM

## 2023-09-10 ENCOUNTER — Ambulatory Visit: Payer: Medicare Other

## 2023-09-11 ENCOUNTER — Ambulatory Visit (INDEPENDENT_AMBULATORY_CARE_PROVIDER_SITE_OTHER): Payer: Medicare Other

## 2023-09-11 DIAGNOSIS — E538 Deficiency of other specified B group vitamins: Secondary | ICD-10-CM

## 2023-09-11 MED ORDER — CYANOCOBALAMIN 1000 MCG/ML IJ SOLN
1000.0000 ug | Freq: Once | INTRAMUSCULAR | Status: AC
Start: 2023-09-11 — End: 2023-09-11
  Administered 2023-09-11: 1000 ug via INTRAMUSCULAR

## 2023-09-11 NOTE — Progress Notes (Signed)
Pt here for bi weekly B12 injection per Dr Claiborne Billings   B12 given IM, and pt tolerated injection well.   Next B12 injection scheduled for 2 weeks  1/4 q2wks

## 2023-09-12 ENCOUNTER — Ambulatory Visit: Payer: Medicare Other | Attending: Physician Assistant

## 2023-09-12 DIAGNOSIS — R4701 Aphasia: Secondary | ICD-10-CM | POA: Diagnosis present

## 2023-09-12 DIAGNOSIS — R482 Apraxia: Secondary | ICD-10-CM | POA: Diagnosis not present

## 2023-09-12 NOTE — Patient Instructions (Signed)
Speech Exercises  Repeat these phrases 2 times, 2 times a day  Call the cat "Buttercup" A calendar of [place] Four floors to cover Yellow oil ointment Fellow lovers of felines Catastrophe in [place] Plump plumbers' plums The church's chimes chimed Telling time 'til eleven Five valve levers Keep the gate closed Go see that guy Fat cows give milk Automatic Data Gophers Fat frogs flip freely [employer] into bed Get that game to [employer] Thick thistles stick together Cinnamon aluminum linoleum Black bugs blood Lovely lemon linament Red leather, yellow leather  Big grocery buggy    Purple baby carriage [facility] Proper copper coffee pot Ripe purple cabbage Three free throws [employer] tackled  [employer] dipped the dessert  [employer] Buckle that [employer] Shirts shrink, shells shouldn't [place] [employer] Take the tackle box File the flash message Give me five flapjacks Fundamental relatives Dye the pets purple Talking [place] time after time Dark chocolate chunks Political landscape of the kingdom Actuary genius We played yo-yos yesterday

## 2023-09-12 NOTE — Therapy (Signed)
OUTPATIENT SPEECH LANGUAGE PATHOLOGY TREATMENT   Patient Name: Ann [name] MRN: [id number] DOB:20-Aug-1951, 72 y.o., female Today's Date: 09/12/2023  PCP: Felix Pacini, DO REFERRING PROVIDER: Durenda Age, PA-C  END OF SESSION:  End of Session - 09/12/23 1451     Visit Number 2    Number of Visits 7    Date for SLP Re-Evaluation 10/19/23    SLP Start Time 1020    SLP Stop Time  1100    SLP Time Calculation (min) 40 min    Activity Tolerance Patient tolerated treatment well              Past Medical History:  Diagnosis Date   Arthritis    osteoarthritis- knees.   Atrial fibrillation (HCC)    Dr. Maisie Fus status post cardioversion chronic anticoagulation   Cellulitis of right lower extremity 12/30/2020   Chicken pox    Colon cancer (HCC) 2001   Radiation, chemo.  Adenocarcinoma of the colon.  Followed by Dr. Kennith Center   Complication of anesthesia    sensitive to narcotics"extra sleepy". Versed- "no memory recall for 4 days postop"   Diabetes mellitus without complication (HCC)    oral and Insulin   Hepatitis    "sub clinical case of Hepatitis in late 70's""has immunities"   Hypertension    Hypothyroidism    Laceration of head 08/04/2019   8 cm laceration forehead/scalp after tripping when mowing her lawn.   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   Ovarian teratoma, right 09/2000   Oophorectomy   Sleep apnea    unable to tolreate cpap   Wisdom teeth extracted    Past Surgical History:  Procedure Laterality Date   ABDOMINAL HYSTERECTOMY  09/2000    Total hysterectomy and oophorectomy   ABDOMINAL WOUND DEHISCENCE Bilateral    multiple times / mesh use with multiple abdominal hernia repair   CARDIOVERSION  2014   Unsuccessful.  Patient reports cardiac ablation was not felt to be appropriate next step by cardiology.   CHOLECYSTECTOMY  04/1998   '98  -Lapraroscopic   COLON RESECTION  2001   "colon cancer"   FOOT SURGERY Bilateral    multiple "hammer toes and bunionectomies"   KNEE ARTHROSCOPY Left 08/2006   meniscectomy   MELANOMA EXCISION     near scapula   TOTAL KNEE ARTHROPLASTY Right 01/24/2016   Procedure: RIGHT TOTAL KNEE ARTHROPLASTY;  Surgeon: Ollen Gross, MD;  Location: [facility];  Service: Orthopedics;  Laterality: Right;   Patient Active Problem List   Diagnosis Date Noted   Expressive aphasia 08/24/2023   Cerebrovascular accident (CVA) due to stenosis of cerebral artery (HCC) 08/24/2023   Basal ganglia stroke (HCC) 07/24/2023   Vertebral artery occlusion, left 07/24/2023   Subclavian artery stenosis (HCC) 07/24/2023   Left carotid artery stenosis 07/24/2023   Internal carotid artery stenosis, bilateral-25% right and 50% left proximal ICA stenosis 07/24/2023   Aortic atherosclerosis (HCC) 07/23/2023   Coronary artery calcification of native artery 07/23/2023   Cardiomegaly 07/23/2023   Diverticulosis 07/23/2023   Thiamine deficiency 07/13/2023   FH: stroke 07/09/2023   Unintentional weight loss 07/09/2023   Word finding difficulty 07/09/2023   Diarrhea 07/09/2023   Vitamin D deficiency 07/09/2023   Memory changes 07/09/2023   Type 2 diabetes mellitus with hyperglycemia, with long-term current use of insulin (HCC) 08/25/2022   Morbid obesity (  HCC) 09/12/2021   Elevated bilirubin 03/22/2021   Gout 07/23/2020   Sleep disturbance 04/20/2020   Anxiety 04/20/2020   Atrial fibrillation (HCC)    Type 2 diabetes mellitus with hyperlipidemia (HCC)    Hypertension    Hypothyroidism    Chronic vulvitis 11/13/2018   OA (osteoarthritis) of knee 01/24/2016   Mitral valve disease 08/16/2012    ONSET DATE: August 2024   REFERRING DIAG:  I63.50 (ICD-10-CM) - Cerebrovascular accident (CVA) due to stenosis of cerebral artery (HCC)  I63.81 (ICD-10-CM) - Basal ganglia stroke (HCC)  R47.01 (ICD-10-CM) - Expressive aphasia     THERAPY DIAG:  Aphasia  Verbal apraxia  Rationale for Evaluation and Treatment: Rehabilitation  SUBJECTIVE:   SUBJECTIVE STATEMENT: "I am having less faux pas. I am finishing my sentences more."   Pt accompanied by: self  PERTINENT HISTORY: hypertension, hyperlipidemia, A-fib noncompliant with anticoagulation, history of colon cancer in 2001, hypothyroidism, B1 and B12 deficiency, vitamin D deficiency, history of melanoma status post wide excision near scapula, OSA unable to tolerate CPAP  PAIN:  Are you having pain? No  FALLS: Has patient fallen in last 6 months?  No   PATIENT GOALS: "I don't know what to expect."  OBJECTIVE:   DIAGNOSTIC FINDINGS:  MRI HEAD WITHOUT AND WITH CONTRAST  07/18/23 CLINICAL DATA:  Altered mental status, word finding difficulty for 6 weeks.   TECHNIQUE: Multiplanar, multiecho pulse sequences of the brain and surrounding structures were obtained without and with intravenous contrast.   CONTRAST:  10 cc Vueway   COMPARISON:  None Available.   FINDINGS: Brain: There is diffusion restriction with associated FLAIR signal abnormality in the left caudate and corona radiata consistent with acute to early subacute infarct. T1 hyperintensity and ill-defined SWI signal dropout is consistent with petechial hemorrhage. There is no mass effect or midline shift.   There is no other evidence of acute infarct or intracranial hemorrhage. There is no extra-axial fluid collection.   Background parenchymal volume is normal for age. The ventricles are normal in size. Patchy FLAIR signal abnormality in the supratentorial white matter and pons likely reflects underlying chronic small-vessel ischemic change, mild for age.   The pituitary and suprasellar region are normal. There is no mass lesion or abnormal enhancement.   Vascular: Normal flow voids.   Skull and upper cervical spine: Normal marrow signal.   Sinuses/Orbits: The paranasal sinuses are  clear. The globes and orbits are unremarkable.   Other: The mastoid air cells and middle ear cavities are clear.   IMPRESSION: Acute to early subacute infarct in the left basal ganglia with petechial hemorrhage but no mass effect or midline shift.   Findings were initially discussed with the MRI technologist at the time of scan acquisition, who was advised to direct the patient to the emergency department. The findings were then discussed with EMS at approximately 11:00 am. Findings discussed with provider RENEE KUNEFF at 11:22 am.   PATIENT REPORTED OUTCOME MEASURES (PROM): Communication Participation Item Bank: provided 09/12/23 and asked to return next session.   TODAY'S TREATMENT:  DATE:  09/12/23: Pt arrived on day of last scheduled appointment two hours early and decided not to wait that long, and arrived today 45 minutes early for her appointment.  Today, Pam had questions re: basal ganglia CVA effects on LE movement.  SLP provided infor from [facility] encouraged her to ask her neurologist.  SLP and pt practiced her HEP (consonant laden phrases) and pt had 4 aphasic and one apraxic error. Apraxic error and one aphasic error were self corrected, pt req'd min-mod A for error awarness with other aphasic errors. SLP strongly encouraged pt to slow her rate of speech down so she can be more accurate with syntax and she will have greater opportunity to self correct her errors. SLP provided pt with homework to practice reduced speech rate practicing 20 minutes BID.    09/03/23: SLP assisted and educated pt about how to complete a HEP with consonant-laden words. Pt had semantic paraphasias (mostly difficulty exchanging plurals/non-plural - e.g., valve levers) which she corrected 70% of the time. SLP had to provide initial mod A for deliberate/purposeful  articulation, faded to rare min A.    PATIENT EDUCATION: Education details: procedure for HEP, results of eval: suspect aphasia with some apraxia possible, rationale for frequency and duration of ST, how ST will assist pt Person educated: Patient Education method: Explanation, Demonstration, and Verbal cues Education comprehension: verbalized understanding, returned demonstration, verbal cues required, and needs further education   GOALS: Goals reviewed with patient? Yes  SHORT TERM GOALS: Target date: 10/05/23  Pt will complete consonant-laden phrases (HEP) with 100% accuracy Baseline: Goal status: INITIAL  2.  Pt will complete 10 minutes simple-mod complex conversation using compensations in 2 sessions Baseline:  Goal status: INITIAL   LONG TERM GOALS: Target date: 11/05/23  Pt will complete 15 minutes simple-mod complex conversation using compensations in 2 sessions Baseline:  Goal status: INITIAL  2.  Pt will complete consonant-laden phrases (HEP) with 100% accuracy in 2 sessions after 10/05/23 Baseline:  Goal status: INITIAL  3.  Pt will improve PROM score Baseline:  Goal status: INITIAL  4.  Pt will indicate she is more comfortable to engage in conversation than previous to ST Baseline:  Goal status: INITIAL   ASSESSMENT:  CLINICAL IMPRESSION: Patient is a 72 y.o. F who was seen today for assessment of speech and langauge following CVA in August 2024. Error awareness of aphasic errors during HEP was 25%. SLP introduced the need for pt to reduce speech rate to improve sentence generation and to improve error awareness.  Pt states "I hung out for a little while (pt's chart states two weeks) before going to the hospital." She denies overt s/sx dysphagia, and states cognitive-communication skills are unchanged. SLP recommeded x1/week x6 weeks and pt agreed to four weeks, saying, "I don't know what to expect, I'll try 4 weeks."  OBJECTIVE IMPAIRMENTS: include aphasia  and apraxia. These impairments are limiting patient from household responsibilities, ADLs/IADLs, and effectively communicating at home and in community. Factors affecting potential to achieve goals and functional outcome are cooperation/participation level. Patient will benefit from skilled SLP services to address above impairments and improve overall function.  REHAB POTENTIAL: Good  PLAN:  SLP FREQUENCY: 1x/week  SLP DURATION: 6 weeks, however pt scheduled 4 weeks  PLANNED INTERVENTIONS: Language facilitation, Internal/external aids, Oral motor exercises, Functional tasks, SLP instruction and feedback, Compensatory strategies, and Patient/family education    [facility], CCC-SLP 09/12/2023, 2:51 PM

## 2023-09-17 ENCOUNTER — Ambulatory Visit: Payer: Medicare Other

## 2023-09-17 DIAGNOSIS — R482 Apraxia: Secondary | ICD-10-CM

## 2023-09-17 DIAGNOSIS — R4701 Aphasia: Secondary | ICD-10-CM

## 2023-09-17 NOTE — Therapy (Signed)
OUTPATIENT SPEECH LANGUAGE PATHOLOGY TREATMENT   Patient Name: Ann [name] MRN: [id number] DOB:02/07/1951, 72 y.o., female Today's Date: 09/17/2023  PCP: Felix Pacini, DO REFERRING PROVIDER: Durenda Age, PA-C  END OF SESSION:  End of Session - 09/17/23 1205     Visit Number 3    Number of Visits 7    Date for SLP Re-Evaluation 10/19/23    SLP Start Time 1103    SLP Stop Time  1141    SLP Time Calculation (min) 38 min    Activity Tolerance Patient tolerated treatment well               Past Medical History:  Diagnosis Date   Arthritis    osteoarthritis- knees.   Atrial fibrillation (HCC)    Dr. Maisie Fus status post cardioversion chronic anticoagulation   Cellulitis of right lower extremity 12/30/2020   Chicken pox    Colon cancer (HCC) 2001   Radiation, chemo.  Adenocarcinoma of the colon.  Followed by Dr. Kennith Center   Complication of anesthesia    sensitive to narcotics"extra sleepy". Versed- "no memory recall for 4 days postop"   Diabetes mellitus without complication (HCC)    oral and Insulin   Hepatitis    "sub clinical case of Hepatitis in late 70's""has immunities"   Hypertension    Hypothyroidism    Laceration of head 08/04/2019   8 cm laceration forehead/scalp after tripping when mowing her lawn.   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   Ovarian teratoma, right 09/2000   Oophorectomy   Sleep apnea    unable to tolreate cpap   Wisdom teeth extracted    Past Surgical History:  Procedure Laterality Date   ABDOMINAL HYSTERECTOMY  09/2000    Total hysterectomy and oophorectomy   ABDOMINAL WOUND DEHISCENCE Bilateral    multiple times / mesh use with multiple abdominal hernia repair   CARDIOVERSION  2014   Unsuccessful.  Patient reports cardiac ablation was not felt to be appropriate next step by cardiology.   CHOLECYSTECTOMY  04/1998   '98  -Lapraroscopic   COLON RESECTION  2001   "colon cancer"   FOOT SURGERY Bilateral    multiple "hammer toes and bunionectomies"   KNEE ARTHROSCOPY Left 08/2006   meniscectomy   MELANOMA EXCISION     near scapula   TOTAL KNEE ARTHROPLASTY Right 01/24/2016   Procedure: RIGHT TOTAL KNEE ARTHROPLASTY;  Surgeon: Ollen Gross, MD;  Location: [facility];  Service: Orthopedics;  Laterality: Right;   Patient Active Problem List   Diagnosis Date Noted   Expressive aphasia 08/24/2023   Cerebrovascular accident (CVA) due to stenosis of cerebral artery (HCC) 08/24/2023   Basal ganglia stroke (HCC) 07/24/2023   Vertebral artery occlusion, left 07/24/2023   Subclavian artery stenosis (HCC) 07/24/2023   Left carotid artery stenosis 07/24/2023   Internal carotid artery stenosis, bilateral-25% right and 50% left proximal ICA stenosis 07/24/2023   Aortic atherosclerosis (HCC) 07/23/2023   Coronary artery calcification of native artery 07/23/2023   Cardiomegaly 07/23/2023   Diverticulosis 07/23/2023   Thiamine deficiency 07/13/2023   FH: stroke 07/09/2023   Unintentional weight loss 07/09/2023   Word finding difficulty 07/09/2023   Diarrhea 07/09/2023   Vitamin D deficiency 07/09/2023   Memory changes 07/09/2023   Type 2 diabetes mellitus with hyperglycemia, with long-term current use of insulin (HCC) 08/25/2022   Morbid  obesity (HCC) 09/12/2021   Elevated bilirubin 03/22/2021   Gout 07/23/2020   Sleep disturbance 04/20/2020   Anxiety 04/20/2020   Atrial fibrillation (HCC)    Type 2 diabetes mellitus with hyperlipidemia (HCC)    Hypertension    Hypothyroidism    Chronic vulvitis 11/13/2018   OA (osteoarthritis) of knee 01/24/2016   Mitral valve disease 08/16/2012    ONSET DATE: August 2024   REFERRING DIAG:  I63.50 (ICD-10-CM) - Cerebrovascular accident (CVA) due to stenosis of cerebral artery (HCC)  I63.81 (ICD-10-CM) - Basal ganglia stroke (HCC)  R47.01 (ICD-10-CM) - Expressive aphasia     THERAPY DIAG:  Aphasia  Verbal apraxia  Rationale for Evaluation and Treatment: Rehabilitation  SUBJECTIVE:   SUBJECTIVE STATEMENT:  "I didn't bring my words. I'm sorry." "I think I need this therapy. Do you?" (SLP responded affirmatively)  Pt accompanied by: self  PERTINENT HISTORY: hypertension, hyperlipidemia, A-fib noncompliant with anticoagulation, history of colon cancer in 2001, hypothyroidism, B1 and B12 deficiency, vitamin D deficiency, history of melanoma status post wide excision near scapula, OSA unable to tolerate CPAP  PAIN:  Are you having pain? No  FALLS: Has patient fallen in last 6 months?  No   PATIENT GOALS: "I don't know what to expect."  OBJECTIVE:   DIAGNOSTIC FINDINGS:  MRI HEAD WITHOUT AND WITH CONTRAST  07/18/23 CLINICAL DATA:  Altered mental status, word finding difficulty for 6 weeks.   TECHNIQUE: Multiplanar, multiecho pulse sequences of the brain and surrounding structures were obtained without and with intravenous contrast.   CONTRAST:  10 cc Vueway   COMPARISON:  None Available.   FINDINGS: Brain: There is diffusion restriction with associated FLAIR signal abnormality in the left caudate and corona radiata consistent with acute to early subacute infarct. T1 hyperintensity and ill-defined SWI signal dropout is consistent with petechial hemorrhage. There is no mass effect or midline shift.   There is no other evidence of acute infarct or intracranial hemorrhage. There is no extra-axial fluid collection.   Background parenchymal volume is normal for age. The ventricles are normal in size. Patchy FLAIR signal abnormality in the supratentorial white matter and pons likely reflects underlying chronic small-vessel ischemic change, mild for age.   The pituitary and suprasellar region are normal. There is no mass lesion or abnormal enhancement.   Vascular: Normal flow voids.   Skull and upper cervical spine: Normal marrow  signal.   Sinuses/Orbits: The paranasal sinuses are clear. The globes and orbits are unremarkable.   Other: The mastoid air cells and middle ear cavities are clear.   IMPRESSION: Acute to early subacute infarct in the left basal ganglia with petechial hemorrhage but no mass effect or midline shift.   Findings were initially discussed with the MRI technologist at the time of scan acquisition, who was advised to direct the patient to the emergency department. The findings were then discussed with EMS at approximately 11:00 am. Findings discussed with provider RENEE KUNEFF at 11:22 am.   PATIENT REPORTED OUTCOME MEASURES (PROM): Communication Participation Item Bank: provided 09/12/23 and asked to return next session.   TODAY'S TREATMENT:  DATE:  09/17/23: SLP assisted Pam in completing some of her homework from previous session - req'd min-mod A for >2 definitions, and to put responses in definition form rather than in sentence form. When Pam completed this task providing sentences, it was easier for her to complete. SLP worked with pt on [employer] to encourage semantic relationships between words, foster carryover of word finding ability, and to provide her with additional language tasks to complete at home. Pam req'd occasional min verbal cues for responses that were complete sentences faded to independence .  09/12/23: Pt arrived on day of last scheduled appointment two hours early and decided not to wait that long, and arrived today 45 minutes early for her appointment.  Today, Pam had questions re: basal ganglia CVA effects on LE movement.  SLP provided infor from [facility] encouraged her to ask her neurologist.  SLP and pt practiced her HEP (consonant laden phrases) and pt had 4 aphasic and one apraxic error. Apraxic error and one  aphasic error were self corrected, pt req'd min-mod A for error awarness with other aphasic errors. SLP strongly encouraged pt to slow her rate of speech down so she can be more accurate with syntax and she will have greater opportunity to self correct her errors. SLP provided pt with homework to practice reduced speech rate practicing 20 minutes BID.    09/03/23: SLP assisted and educated pt about how to complete a HEP with consonant-laden words. Pt had semantic paraphasias (mostly difficulty exchanging plurals/non-plural - e.g., valve levers) which she corrected 70% of the time. SLP had to provide initial mod A for deliberate/purposeful articulation, faded to rare min A.    PATIENT EDUCATION: Education details: procedure for HEP, results of eval: suspect aphasia with some apraxia possible, rationale for frequency and duration of ST, how ST will assist pt Person educated: Patient Education method: Explanation, Demonstration, and Verbal cues Education comprehension: verbalized understanding, returned demonstration, verbal cues required, and needs further education   GOALS: Goals reviewed with patient? Yes  SHORT TERM GOALS: Target date: 10/05/23  Pt will complete consonant-laden phrases (HEP) with 100% accuracy Baseline: Goal status: INITIAL  2.  Pt will complete 10 minutes simple-mod complex conversation using compensations in 2 sessions Baseline:  Goal status: INITIAL   LONG TERM GOALS: Target date: 11/05/23  Pt will complete 15 minutes simple-mod complex conversation using compensations in 2 sessions Baseline:  Goal status: INITIAL  2.  Pt will complete consonant-laden phrases (HEP) with 100% accuracy in 2 sessions after 10/05/23 Baseline:  Goal status: INITIAL  3.  Pt will improve PROM score Baseline:  Goal status: INITIAL  4.  Pt will indicate she is more comfortable to engage in conversation than previous to ST Baseline:  Goal status:  INITIAL   ASSESSMENT:  CLINICAL IMPRESSION: Patient is a 72 y.o. F who was seen today for assessment of speech and langauge following CVA in August 2024. Error awareness of aphasic errors during HEP was 25%. SLP introduced the need for pt to reduce speech rate to improve sentence generation and to improve error awareness.  Pt states "I hung out for a little while (pt's chart states two weeks) before going to the hospital." She denies overt s/sx dysphagia, and states cognitive-communication skills are unchanged. SLP recommeded x1/week x6 weeks and pt agreed to four weeks, saying, "I don't know what to expect, I'll try 4 weeks."  OBJECTIVE IMPAIRMENTS: include aphasia and apraxia. These impairments are limiting patient from household  responsibilities, ADLs/IADLs, and effectively communicating at home and in community. Factors affecting potential to achieve goals and functional outcome are cooperation/participation level. Patient will benefit from skilled SLP services to address above impairments and improve overall function.  REHAB POTENTIAL: Good  PLAN:  SLP FREQUENCY: 1x/week  SLP DURATION: 6 weeks, however pt scheduled 4 weeks  PLANNED INTERVENTIONS: Language facilitation, Internal/external aids, Oral motor exercises, Functional tasks, SLP instruction and feedback, Compensatory strategies, and Patient/family education    [facility], CCC-SLP 09/17/2023, 12:06 PM

## 2023-09-18 ENCOUNTER — Encounter: Payer: Self-pay | Admitting: Family Medicine

## 2023-09-18 ENCOUNTER — Ambulatory Visit (INDEPENDENT_AMBULATORY_CARE_PROVIDER_SITE_OTHER): Payer: Medicare Other | Admitting: Family Medicine

## 2023-09-18 VITALS — BP 124/78 | HR 65 | Temp 97.5°F | Wt 267.8 lb

## 2023-09-18 DIAGNOSIS — F419 Anxiety disorder, unspecified: Secondary | ICD-10-CM | POA: Diagnosis not present

## 2023-09-18 DIAGNOSIS — R4701 Aphasia: Secondary | ICD-10-CM

## 2023-09-18 DIAGNOSIS — E1169 Type 2 diabetes mellitus with other specified complication: Secondary | ICD-10-CM

## 2023-09-18 DIAGNOSIS — I6381 Other cerebral infarction due to occlusion or stenosis of small artery: Secondary | ICD-10-CM

## 2023-09-18 DIAGNOSIS — G479 Sleep disorder, unspecified: Secondary | ICD-10-CM | POA: Diagnosis not present

## 2023-09-18 DIAGNOSIS — Z794 Long term (current) use of insulin: Secondary | ICD-10-CM

## 2023-09-18 DIAGNOSIS — E034 Atrophy of thyroid (acquired): Secondary | ICD-10-CM | POA: Diagnosis not present

## 2023-09-18 DIAGNOSIS — I4821 Permanent atrial fibrillation: Secondary | ICD-10-CM | POA: Diagnosis not present

## 2023-09-18 DIAGNOSIS — E538 Deficiency of other specified B group vitamins: Secondary | ICD-10-CM

## 2023-09-18 DIAGNOSIS — I771 Stricture of artery: Secondary | ICD-10-CM

## 2023-09-18 DIAGNOSIS — R4789 Other speech disturbances: Secondary | ICD-10-CM

## 2023-09-18 DIAGNOSIS — E785 Hyperlipidemia, unspecified: Secondary | ICD-10-CM

## 2023-09-18 DIAGNOSIS — I10 Essential (primary) hypertension: Secondary | ICD-10-CM

## 2023-09-18 DIAGNOSIS — E559 Vitamin D deficiency, unspecified: Secondary | ICD-10-CM

## 2023-09-18 DIAGNOSIS — Z23 Encounter for immunization: Secondary | ICD-10-CM | POA: Diagnosis not present

## 2023-09-18 DIAGNOSIS — M109 Gout, unspecified: Secondary | ICD-10-CM

## 2023-09-18 DIAGNOSIS — I6522 Occlusion and stenosis of left carotid artery: Secondary | ICD-10-CM

## 2023-09-18 DIAGNOSIS — I6502 Occlusion and stenosis of left vertebral artery: Secondary | ICD-10-CM

## 2023-09-18 DIAGNOSIS — I6350 Cerebral infarction due to unspecified occlusion or stenosis of unspecified cerebral artery: Secondary | ICD-10-CM

## 2023-09-18 DIAGNOSIS — I6523 Occlusion and stenosis of bilateral carotid arteries: Secondary | ICD-10-CM

## 2023-09-18 DIAGNOSIS — E519 Thiamine deficiency, unspecified: Secondary | ICD-10-CM | POA: Diagnosis not present

## 2023-09-18 DIAGNOSIS — E1165 Type 2 diabetes mellitus with hyperglycemia: Secondary | ICD-10-CM

## 2023-09-18 DIAGNOSIS — I700 Atherosclerosis of aorta: Secondary | ICD-10-CM

## 2023-09-18 DIAGNOSIS — I2510 Atherosclerotic heart disease of native coronary artery without angina pectoris: Secondary | ICD-10-CM

## 2023-09-18 LAB — VITAMIN B12: Vitamin B-12: 645 pg/mL (ref 211–911)

## 2023-09-18 LAB — VITAMIN D 25 HYDROXY (VIT D DEFICIENCY, FRACTURES): VITD: 22.29 ng/mL — ABNORMAL LOW (ref 30.00–100.00)

## 2023-09-18 MED ORDER — METOPROLOL TARTRATE 50 MG PO TABS: 50.0000 mg | ORAL_TABLET | Freq: Two times a day (BID) | ORAL | 1 refills | Status: DC

## 2023-09-18 MED ORDER — COLCHICINE 0.6 MG PO TABS: ORAL_TABLET | ORAL | 5 refills | Status: DC

## 2023-09-18 MED ORDER — ALLOPURINOL 100 MG PO TABS: 100.0000 mg | ORAL_TABLET | Freq: Every day | ORAL | 1 refills | Status: DC

## 2023-09-18 MED ORDER — OLMESARTAN MEDOXOMIL-HCTZ 40-25 MG PO TABS: 1.0000 | ORAL_TABLET | Freq: Every day | ORAL | 1 refills | Status: DC

## 2023-09-18 MED ORDER — ZOLPIDEM TARTRATE 5 MG PO TABS: 5.0000 mg | ORAL_TABLET | Freq: Every evening | ORAL | 5 refills | Status: DC | PRN

## 2023-09-18 MED ORDER — ALPRAZOLAM 0.25 MG PO TABS: 0.2500 mg | ORAL_TABLET | Freq: Every day | ORAL | 5 refills | Status: DC | PRN

## 2023-09-18 NOTE — Progress Notes (Signed)
Patient ID: Ann [name], female  DOB: October 12, 1951, 72 y.o.   MRN: [id number] Patient Care Team    Relationship Specialty Notifications Start End  Natalia Leatherwood, DO PCP - General Family Medicine  07/18/23   Yates Decamp, MD Consulting Physician Cardiology  04/20/20   Cherlyn Roberts, MD Referring Physician Dermatology  04/20/20   Jill Side, OD Referring Physician   04/20/20   Derrill Memo, MD Referring Physician Orthotics  04/20/20   Frankey Poot, MD Referring Physician Obstetrics and Gynecology  04/20/20   Konrad Penta, MD Referring Physician Gastroenterology  04/20/20   Shamleffer, Konrad Dolores, MD Attending Physician Endocrinology  01/31/23     Chief Complaint  Patient presents with   Medical Management of Chronic Issues    vitamin deficiency     Subjective: Ann [name] is a 72 y.o.  female present for Chronic Conditions/illness Management and follow up on recent stroke, word finding difficulties and vitamin deficiencies b1, b12 and vit d.  HTN/HLD/morbid obesity/atrial fibrillation: Pt reports compliance with metoprolol 50 mg twice daily and olmesartan-HCTZ 40-25 mg daily. Blood pressures ranges at home than normal limits.  Patient denies chest pain, shortness of breath, dizziness or lower extremity edema.  Pt declined statin. RF: Hypertension, hyperlipidemia, obesity, diabetes, fib, family history of heart disease  Gout: Patient reports compliance with allopurinol 100 mg daily.  Sleep disturbance/anxiety Patient reports compliance with Ambien 5 mg nightly.  Without use she is not able to sleep.  She will take Xanax at times, she uses Xanax as needed only for increased anxiety or unable to fall asleep.  She denies oversedation.       07/11/2023    9:10 AM 07/09/2023   11:39 AM 04/04/2023    3:51 PM 07/05/2022    9:55 AM 02/14/2022   10:59 AM  Depression screen PHQ 2/9  Decreased Interest 0 0 0 0 0  Down, Depressed, Hopeless 0 0 0 0 0  PHQ - 2 Score 0 0 0  0 0  Altered sleeping     3  Tired, decreased energy     0  Change in appetite     0  Feeling bad or failure about yourself      0  Trouble concentrating     0  Moving slowly or fidgety/restless     0  Suicidal thoughts     0  PHQ-9 Score     3      02/14/2022   11:01 AM 09/12/2021   11:08 AM  GAD 7 : Generalized Anxiety Score  Nervous, Anxious, on Edge 0 1  Control/stop worrying 0 0  Worry too much - different things 0 0  Trouble relaxing 0 0  Restless 0 0  Easily annoyed or irritable 0 0  Afraid - awful might happen 0 0  Total GAD 7 Score 0 1          08/24/2023    8:02 AM 07/11/2023    9:12 AM 07/09/2023   11:39 AM 04/04/2023    3:50 PM 07/05/2022    9:57 AM  Fall Risk   Falls in the past year? 0 0 0 0 0  Number falls in past yr: 0 0 0 0 0  Injury with Fall? 0 0 0 0 0  Risk for fall due to :  Impaired vision;Impaired balance/gait   Impaired vision;Impaired mobility;Impaired balance/gait  Follow up Falls evaluation completed Falls prevention discussed Falls evaluation completed Falls evaluation completed  Falls prevention discussed    Immunization History  Administered Date(s) Administered   Fluad Quad(high Dose 65+) 09/26/2021, 09/16/2022   Fluad Trivalent(High Dose 65+) 09/18/2023   Influenza-Unspecified 09/26/2019   Moderna SARS-COV2 Booster Vaccination 09/02/2021   Moderna Sars-Covid-2 Vaccination 01/22/2020, 02/20/2020, 10/20/2020   Pfizer(Comirnaty)Fall Seasonal Vaccine 12 years and older 10/02/2022   Pneumococcal Conjugate-13 10/12/2014   Pneumococcal Polysaccharide-23 10/21/2019   Tdap 08/04/2019    No results found.  Past Medical History:  Diagnosis Date   Arthritis    osteoarthritis- knees.   Atrial fibrillation (HCC)    Dr. Maisie Fus status post cardioversion chronic anticoagulation   Cellulitis of right lower extremity 12/30/2020   Chicken pox    Colon cancer (HCC) 2001   Radiation, chemo.  Adenocarcinoma of the colon.  Followed by Dr. Kennith Center    Complication of anesthesia    sensitive to narcotics"extra sleepy". Versed- "no memory recall for 4 days postop"   Diabetes mellitus without complication (HCC)    oral and Insulin   Hepatitis    "sub clinical case of Hepatitis in late 70's""has immunities"   Hypertension    Hypothyroidism    Laceration of head 08/04/2019   8 cm laceration forehead/scalp after tripping when mowing her lawn.   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   Ovarian teratoma, right 09/2000   Oophorectomy   Sleep apnea    unable to tolreate cpap   Wisdom teeth extracted    Allergies  Allergen Reactions   Amoxicillin Shortness Of Breath, Swelling and Anaphylaxis    Has patient had a PCN reaction causing immediate rash, facial/tongue/throat swelling, SOB or lightheadedness with hypotension: Yes Has patient had a PCN reaction causing severe rash involving mucus membranes or skin necrosis: No Has patient had a PCN reaction that required hospitalization No Has patient had a PCN reaction occurring within the last 10 years: Yes If all of the above answers are "NO", then may proceed with Cephalosporin use.  Has patient had a PCN reaction causing immediate rash, facial/tongue/throat swelling, SOB or lightheadedness with hypotension: Yes Has patient had a PCN reaction causing severe rash involving mucus membranes or skin necrosis: No Has patient had a PCN reaction that required hospitalization No Has patient had a PCN reaction occurring within the last 10 years: Yes If all of the above answers are "NO", then may proceed with Cephalosporin use. Has patient had a PCN reaction causing immediate rash, facial/tongue/throat swelling, SOB or lightheadedness with hypotension: Yes Has patient had a PCN reaction causing severe rash involving mucus membranes or skin necrosis: No Has patient had a PCN reaction that required  hospitalization No Has patient had a PCN reaction occurring within the last 10 years: Yes If all of the above answers are "NO", then may proceed with Cephalosporin use.    Ampicillin    Codeine Itching    Tolerates hydrocodone   Ezetimibe    Livalo [Pitavastatin]    Statins Other (See Comments)    Muscle skeletal side effects   Welchol [Colesevelam]     Gi se   Past Surgical History:  Procedure Laterality Date   ABDOMINAL HYSTERECTOMY  09/2000    Total hysterectomy and oophorectomy   ABDOMINAL WOUND DEHISCENCE Bilateral    multiple times / mesh use with multiple abdominal hernia repair   CARDIOVERSION  2014   Unsuccessful.  Patient reports cardiac  ablation was not felt to be appropriate next step by cardiology.   CHOLECYSTECTOMY  04/1998   '98 -Lapraroscopic   COLON RESECTION  2001   "colon cancer"   FOOT SURGERY Bilateral    multiple "hammer toes and bunionectomies"   KNEE ARTHROSCOPY Left 08/2006   meniscectomy   MELANOMA EXCISION     near scapula   TOTAL KNEE ARTHROPLASTY Right 01/24/2016   Procedure: RIGHT TOTAL KNEE ARTHROPLASTY;  Surgeon: Ollen Gross, MD;  Location: [facility];  Service: Orthopedics;  Laterality: Right;   Family History  Problem Relation Age of Onset   Arthritis Mother    Colon cancer Mother    Diabetes Mother    Atrial fibrillation Mother    Diabetes Father    Heart attack Father    Heart disease Father    Stroke Father    Hypertension Maternal Grandmother    Stroke Maternal Grandmother    Stroke Maternal Grandfather    Hypertension Paternal Grandmother    Stroke Paternal Grandmother    Diabetes Paternal Grandfather    Hypertension Paternal Grandfather    Stroke Paternal Grandfather    Diabetes Brother    Brain cancer Brother    Heart disease Brother    Hypertension Brother    Stroke Brother    Social History   Social History Narrative   Marital status/children/pets: Single. G0P0.   Education/employment: Retired Dance movement psychotherapist:      -smoke alarm in the home:Yes     - wears seatbelt: Yes     - Feels safe in their relationships: Yes   Right handed   Drinks caffeine prn   Lives alone   Two story home   retired    Allergies as of 09/18/2023       Reactions   Amoxicillin Shortness Of Breath, Swelling, Anaphylaxis   Has patient had a PCN reaction causing immediate rash, facial/tongue/throat swelling, SOB or lightheadedness with hypotension: Yes Has patient had a PCN reaction causing severe rash involving mucus membranes or skin necrosis: No Has patient had a PCN reaction that required hospitalization No Has patient had a PCN reaction occurring within the last 10 years: Yes If all of the above answers are "NO", then may proceed with Cephalosporin use. Has patient had a PCN reaction causing immediate rash, facial/tongue/throat swelling, SOB or lightheadedness with hypotension: Yes Has patient had a PCN reaction causing severe rash involving mucus membranes or skin necrosis: No Has patient had a PCN reaction that required hospitalization No Has patient had a PCN reaction occurring within the last 10 years: Yes If all of the above answers are "NO", then may proceed with Cephalosporin use. Has patient had a PCN reaction causing immediate rash, facial/tongue/throat swelling, SOB or lightheadedness with hypotension: Yes Has patient had a PCN reaction causing severe rash involving mucus membranes or skin necrosis: No Has patient had a PCN reaction that required hospitalization No Has patient had a PCN reaction occurring within the last 10 years: Yes If all of the above answers are "NO", then may proceed with Cephalosporin use.   Ampicillin    Codeine Itching   Tolerates hydrocodone   Ezetimibe    Livalo [pitavastatin]    Statins Other (See Comments)   Muscle skeletal side effects   Welchol [colesevelam]    Gi se        Medication List        Accurate as of September 18, 2023 12:08 PM. If you have  any questions, ask your nurse or doctor.          allopurinol 100 MG tablet Commonly known as: ZYLOPRIM Take 1 tablet (100 mg total) by mouth daily.   ALPRAZolam 0.25 MG tablet Commonly known as: XANAX Take 1 tablet (0.25 mg total) by mouth daily as needed for anxiety.   Basaglar KwikPen 100 UNIT/ML Inject 46 Units into the skin every morning. What changed: how much to take   colchicine 0.6 MG tablet 2 tabs at onset of flare, followed by 1 tab 2 hours later. Then continue 1 tab daily until flare is resolved.   dabigatran 150 MG Caps capsule Commonly known as: PRADAXA Take 1 capsule (150 mg total) by mouth 2 (two) times daily.   Dexcom G6 Sensor Misc Change every 10 days  DX E11.65   Dexcom G6 Transmitter Misc USE AS DIRECTED   EpiPen 2-Pak 0.3 MG/0.3ML Soaj injection Generic drug: EPINEPHrine Inject 0.3 mg into the skin as needed. Bee stings   Fiasp FlexTouch 100 UNIT/ML FlexTouch Pen Generic drug: insulin aspart Max daily 60 units   levothyroxine 50 MCG tablet Commonly known as: SYNTHROID Take 1 tablet (50 mcg total) by mouth daily before breakfast.   metoprolol tartrate 50 MG tablet Commonly known as: LOPRESSOR Take 1 tablet (50 mg total) by mouth 2 (two) times daily.   Novofine Pen Needle 32G X 6 MM Misc Generic drug: Insulin Pen Needle Inject 1 Device into the skin in the morning, at noon, in the evening, and at bedtime. Use as instructed to inject insulin   olmesartan-hydrochlorothiazide 40-25 MG tablet Commonly known as: BENICAR HCT Take 1 tablet by mouth daily.   OneTouch Delica Plus Lancet33G Misc Apply 1 each topically 3 (three) times daily.   OneTouch Ultra test strip Generic drug: glucose blood SMARTSIG:Via Meter   PRESCRIPTION MEDICATION Inject 50,000 mcg as directed every 14 (fourteen) days. Vitamin B12 injections   Repatha SureClick 140 MG/ML Soaj Generic drug: Evolocumab Inject 140 mg into the skin every  14 (fourteen) days.   Semaglutide(0.25 or 0.5MG /DOS) 2 MG/3ML Sopn Inject 0.5 mg into the skin once a week.   thiamine 100 MG tablet Commonly known as: Vitamin B-1 Take 100 mg by mouth daily.   thiamine 100 MG tablet Commonly known as: VITAMIN B1 Take 2 tablets (200 mg total) by mouth daily for 7 days, THEN 1 tablet (100 mg total) daily. Start taking on: July 13, 2023   triamcinolone ointment 0.5 % Commonly known as: KENALOG Apply topically 2 (two) times daily as needed.   Vitamin D (Ergocalciferol) 1.25 MG (50000 UNIT) Caps capsule Commonly known as: DRISDOL 1 CAP ON MONDAY AND THURSDAY WITH FOOD   zolpidem 5 MG tablet Commonly known as: AMBIEN Take 1 tablet (5 mg total) by mouth at bedtime as needed.        All past medical history, surgical history, allergies, family history, immunizations andmedications were updated in the EMR today and reviewed under the history and medication portions of their EMR.     ROS: 14 pt review of systems performed and negative (unless mentioned in an HPI)  Objective: BP 124/78   Pulse 65   Temp (!) 97.5 F (36.4 C)   Wt 267 lb 12.8 oz (121.5 kg)   SpO2 96%   BMI 40.72 kg/m  Physical Exam Vitals and nursing note reviewed.  Constitutional:      General: She is not in acute distress.    Appearance: Normal appearance. She is obese.  She is not ill-appearing, toxic-appearing or diaphoretic.  HENT:     Head: Normocephalic and atraumatic.  Eyes:     General: No scleral icterus.       Right eye: No discharge.        Left eye: No discharge.     Extraocular Movements: Extraocular movements intact.     Conjunctiva/sclera: Conjunctivae normal.     Pupils: Pupils are equal, round, and reactive to light.  Cardiovascular:     Rate and Rhythm: Normal rate. Rhythm irregular.     Heart sounds: No murmur heard. Pulmonary:     Effort: Pulmonary effort is normal. No respiratory distress.     Breath sounds: Normal breath sounds. No wheezing,  rhonchi or rales.  Musculoskeletal:     Right lower leg: No edema.     Left lower leg: No edema.  Skin:    General: Skin is warm.     Coloration: Skin is not pale.     Findings: No rash.  Neurological:     Mental Status: She is alert and oriented to person, place, and time. Mental status is at baseline.     Motor: No weakness.     Gait: Gait normal.  Psychiatric:        Mood and Affect: Mood normal.        Behavior: Behavior normal.        Thought Content: Thought content normal.        Judgment: Judgment normal.     Assessment/plan: MATTALYN IRIZARRY is a 72 y.o. female present for [facility] Anxiety/sleep disturbance Stable Continue Xanax 0.25 mg daily daily as needed.  She rarely uses this medicine. She uses medications appropriately and understands risks of drug class. Continue Ambien at the 5 mg dose for her.  She understands 5 mg is the maximum recommended dose for those in her age bracket.  If for some reason her Medicare plan will not cover this we can consider GoodRx prescription since generic Ambien is rather inexpensive. Patient understands State regulations on controlled substances and [facility] policy on prescribing controlled substances.  Contract UTD [place] controlled substance database reviewed today 09/18/23 Patient aware a face-to-face follow-up will be needed if she would need refills after 6 months.  Patient aware she will need to request these scripts they will not be automatically refilled since they are controlled substances.  Hypertension/morbid obesity/A-fib:  Stable Continue Pradaxa twice daily> cardiology prescribes Continue metoprolol 50 mg twice daily Continue valsartan-HCTZ 40-25 daily Heart healthy diet recommended Statin declined She is established with cardio.  She reports her cardiologist wants to see her once a year, encouraged her to call him back and get into see him since she recently had a stroke and has A-fib.  His prior  recommendations preexisted a new condition and he would want to see her.  DM w/ hyperlipidemia:  Diabetes managed by endocrine. Last A1c 9.5 Cholesterol has been elevated.  Heart healthy diet and routine exercise encouraged.  Medication management has been declined by patient for her lipids.  Diabetic eye exam up-to-date 05/30/2023 Foot exam UTD Pneumonia vac up-to-date Microalbumin collected today  Gout  Continue allopurinol 100 mg daily - Continue colchicine as needed - Patient was provided with low purine diet  B12 deficiency/Thiamine deficiency/Vitamin D deficiency/expressive aphasia - Vitamin B1 - Vitamin D (25 hydroxy) - Vitamin B12 Patient will be guided on supplements after results received.  Influenza vaccine needed - Flu Vaccine Trivalent High Dose (Fluad)  Hypothyroidism due  to acquired atrophy of thyroid Continue levothyroxine 50 mcg daily.  Labs due next visit  Aortic atherosclerosis (HCC)/Coronary artery calcification of native artery/Basal ganglia stroke (HCC)/Subclavian artery stenosis (HCC)/Vertebral artery occlusion, left/Left carotid artery stenosis/ Internal carotid artery stenosis, bilateral-25% right and 50% left proximal ICA stenosis/Word finding difficulty/Expressive aphasia/Cerebrovascular accident (CVA) due to stenosis of cerebral artery (HCC)/ Continue to follow with cardiology and neurology for management  Patient mentions today that her gastroenterologist asked her neurologist for clearance to have her colonoscopy completed, since she had a recent stroke.  She reports, her neurologist told her that would have to come from her PCP.  With a recent stroke history, A-fib and anticoagulation, the type of recommendations or gastroenterology is requesting would need to come from her neurology team and her cardiology team.  I recommended she call her gastroenterologist and see if they need to resubmit that to her neurology and cardiology teams for further advice,  since gastro is specifically requesting the neurological and cardiac risk assessments, and not her general medical risk assessment.  54 minutes spent in pt encounter on the day of service reviewing multiple chronic conditions, recent stroke follow-up, recent vitamin deficiency follow-up.  Return in about 24 weeks (around 03/04/2024) for Routine chronic condition follow-up.  Orders Placed This Encounter  Procedures   Flu Vaccine Trivalent High Dose (Fluad)   Vitamin B12   Vitamin B1   Vitamin D (25 hydroxy)   Urine Microalbumin w/creat. ratio    Meds ordered this encounter  Medications   zolpidem (AMBIEN) 5 MG tablet    Sig: Take 1 tablet (5 mg total) by mouth at bedtime as needed.    Dispense:  30 tablet    Refill:  5   olmesartan-hydrochlorothiazide (BENICAR HCT) 40-25 MG tablet    Sig: Take 1 tablet by mouth daily.    Dispense:  90 tablet    Refill:  1   metoprolol tartrate (LOPRESSOR) 50 MG tablet    Sig: Take 1 tablet (50 mg total) by mouth 2 (two) times daily.    Dispense:  180 tablet    Refill:  1   ALPRAZolam (XANAX) 0.25 MG tablet    Sig: Take 1 tablet (0.25 mg total) by mouth daily as needed for anxiety.    Dispense:  30 tablet    Refill:  5   allopurinol (ZYLOPRIM) 100 MG tablet    Sig: Take 1 tablet (100 mg total) by mouth daily.    Dispense:  90 tablet    Refill:  1   colchicine 0.6 MG tablet    Sig: 2 tabs at onset of flare, followed by 1 tab 2 hours later. Then continue 1 tab daily until flare is resolved.    Dispense:  30 tablet    Refill:  5    Referral Orders  No referral(s) requested today      Note is dictated utilizing voice recognition software. Although note has been proof read prior to signing, occasional typographical errors still can be missed. If any questions arise, please do not hesitate to call for verification.  Electronically signed by: Felix Pacini, DO [facility] [place]

## 2023-09-18 NOTE — Patient Instructions (Signed)

## 2023-09-19 ENCOUNTER — Other Ambulatory Visit: Payer: Self-pay | Admitting: Family Medicine

## 2023-09-19 MED ORDER — "NEEDLE (DISP) 25G X 1"" MISC": 1.0000 mL | 11 refills | Status: DC

## 2023-09-19 MED ORDER — VITAMIN D (ERGOCALCIFEROL) 1.25 MG (50000 UNIT) PO CAPS: ORAL_CAPSULE | ORAL | 3 refills | Status: DC

## 2023-09-19 MED ORDER — "SYRINGE/NEEDLE (DISP) 25G X 5/8"" 3 ML MISC": 1.0000 mL | 11 refills | Status: DC

## 2023-09-19 MED ORDER — CYANOCOBALAMIN 1000 MCG/ML IJ SOLN: 1000.0000 ug | INTRAMUSCULAR | 11 refills | Status: DC

## 2023-09-19 NOTE — Addendum Note (Signed)
Addended by: Filomena Jungling on: 09/19/2023 12:06 PM   Modules accepted: Orders

## 2023-09-19 NOTE — Telephone Encounter (Signed)
Please inform patient her vit d is improving (now 84, was 14). I have refilled the high dose, twice weekly, supplement > I called this in to her mail in pharmacy since she will likely require this high dose long term now.   B12 levels are much improved. Now 645, was 161. B1 levels are pending and can take up to a week to return.   If she would like to perform her B12 injections at home every 2 weeks, we can call the prescription in for her to do so at a local pharmacy. She can continue them here if desired.

## 2023-09-19 NOTE — Telephone Encounter (Signed)
Pt will do next scheduled b12 and will start doing b12 at home after. Rx pending for 1 yr med and syringe/needle supply

## 2023-09-21 ENCOUNTER — Other Ambulatory Visit: Payer: Medicare Other

## 2023-09-21 DIAGNOSIS — E1169 Type 2 diabetes mellitus with other specified complication: Secondary | ICD-10-CM

## 2023-09-21 DIAGNOSIS — E785 Hyperlipidemia, unspecified: Secondary | ICD-10-CM | POA: Diagnosis not present

## 2023-09-22 LAB — MICROALBUMIN / CREATININE URINE RATIO
Creatinine, Urine: 97 mg/dL (ref 20–275)
Microalb Creat Ratio: 2 mg/g{creat} (ref <30)
Microalb, Ur: 0.2 mg/dL

## 2023-09-24 ENCOUNTER — Telehealth: Payer: Self-pay | Admitting: Physician Assistant

## 2023-09-24 ENCOUNTER — Ambulatory Visit: Payer: Medicare Other

## 2023-09-24 DIAGNOSIS — R4701 Aphasia: Secondary | ICD-10-CM

## 2023-09-24 DIAGNOSIS — R482 Apraxia: Secondary | ICD-10-CM | POA: Diagnosis not present

## 2023-09-24 NOTE — Patient Instructions (Signed)
   Marlowe Kays, PA-C  [facility] Neurology [address], [address] [place], [place] [address] [phone]

## 2023-09-24 NOTE — Telephone Encounter (Signed)
Received fax from [facility] requesting clearance for procedure. It is in [employer]

## 2023-09-24 NOTE — Therapy (Signed)
OUTPATIENT SPEECH LANGUAGE PATHOLOGY TREATMENT   Patient Name: Ann [name] MRN: [id number] DOB:[birth year], 72 y.o., female Today's Date: 09/24/2023  PCP: Felix Pacini, DO REFERRING PROVIDER: Durenda Age, PA-C  END OF SESSION:  End of Session - 09/24/23 1221     Visit Number 4    Number of Visits 7    Date for SLP Re-Evaluation 10/19/23    SLP Start Time 1220    SLP Stop Time  1300    SLP Time Calculation (min) 40 min    Activity Tolerance Patient tolerated treatment well               Past Medical History:  Diagnosis Date   Arthritis    osteoarthritis- knees.   Atrial fibrillation (HCC)    Dr. Maisie Fus status post cardioversion chronic anticoagulation   Cellulitis of right lower extremity 12/30/2020   Chicken pox    Colon cancer (HCC) 2001   Radiation, chemo.  Adenocarcinoma of the colon.  Followed by Dr. Kennith Center   Complication of anesthesia    sensitive to narcotics"extra sleepy". Versed- "no memory recall for 4 days postop"   Diabetes mellitus without complication (HCC)    oral and Insulin   Hepatitis    "sub clinical case of Hepatitis in late 70's""has immunities"   Hypertension    Hypothyroidism    Laceration of head 08/04/2019   8 cm laceration forehead/scalp after tripping when mowing her lawn.   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   Ovarian teratoma, right 09/2000   Oophorectomy   Sleep apnea    unable to tolreate cpap   Wisdom teeth extracted    Past Surgical History:  Procedure Laterality Date   ABDOMINAL HYSTERECTOMY  09/2000    Total hysterectomy and oophorectomy   ABDOMINAL WOUND DEHISCENCE Bilateral    multiple times / mesh use with multiple abdominal hernia repair   CARDIOVERSION  2014   Unsuccessful.  Patient reports cardiac ablation was not felt to be appropriate next step by cardiology.   CHOLECYSTECTOMY  04/1998   '98  -Lapraroscopic   COLON RESECTION  2001   "colon cancer"   FOOT SURGERY Bilateral    multiple "hammer toes and bunionectomies"   KNEE ARTHROSCOPY Left 08/2006   meniscectomy   MELANOMA EXCISION     near scapula   TOTAL KNEE ARTHROPLASTY Right 01/24/2016   Procedure: RIGHT TOTAL KNEE ARTHROPLASTY;  Surgeon: Ollen Gross, MD;  Location: [facility];  Service: Orthopedics;  Laterality: Right;   Patient Active Problem List   Diagnosis Date Noted   Expressive aphasia 08/24/2023   Cerebrovascular accident (CVA) due to stenosis of cerebral artery (HCC) 08/24/2023   Basal ganglia stroke (HCC) 07/24/2023   Vertebral artery occlusion, left 07/24/2023   Subclavian artery stenosis (HCC) 07/24/2023   Left carotid artery stenosis 07/24/2023   Internal carotid artery stenosis, bilateral-25% right and 50% left proximal ICA stenosis 07/24/2023   Aortic atherosclerosis (HCC) 07/23/2023   Coronary artery calcification of native artery 07/23/2023   Cardiomegaly 07/23/2023   Diverticulosis 07/23/2023   Thiamine deficiency 07/13/2023   FH: stroke 07/09/2023   Word finding difficulty 07/09/2023   Diarrhea 07/09/2023   Vitamin D deficiency 07/09/2023   Memory changes 07/09/2023   Type 2 diabetes mellitus with hyperglycemia, with long-term current use of insulin (HCC) 08/25/2022   Morbid obesity (HCC) 09/12/2021   Elevated  bilirubin 03/22/2021   Gout 07/23/2020   Sleep disturbance 04/20/2020   Anxiety 04/20/2020   Atrial fibrillation (HCC)    Type 2 diabetes mellitus with hyperlipidemia (HCC)    Hypertension    Hypothyroidism    Chronic vulvitis 11/13/2018   OA (osteoarthritis) of knee 01/24/2016   Mitral valve disease 08/16/2012    ONSET DATE: August 2024   REFERRING DIAG:  I63.50 (ICD-10-CM) - Cerebrovascular accident (CVA) due to stenosis of cerebral artery (HCC)  I63.81 (ICD-10-CM) - Basal ganglia stroke (HCC)  R47.01 (ICD-10-CM) - Expressive aphasia    THERAPY DIAG:  Aphasia  Rationale  for Evaluation and Treatment: Rehabilitation  SUBJECTIVE:   SUBJECTIVE STATEMENT:  "Am I improving?"  Pt accompanied by: self  PERTINENT HISTORY: hypertension, hyperlipidemia, A-fib noncompliant with anticoagulation, history of colon cancer in 2001, hypothyroidism, B1 and B12 deficiency, vitamin D deficiency, history of melanoma status post wide excision near scapula, OSA unable to tolerate CPAP  PAIN:  Are you having pain? No  FALLS: Has patient fallen in last 6 months?  No   PATIENT GOALS: "I don't know what to expect."  OBJECTIVE:   DIAGNOSTIC FINDINGS:  MRI HEAD WITHOUT AND WITH CONTRAST  07/18/23 CLINICAL DATA:  Altered mental status, word finding difficulty for 6 weeks.   TECHNIQUE: Multiplanar, multiecho pulse sequences of the brain and surrounding structures were obtained without and with intravenous contrast.   CONTRAST:  10 cc Vueway   COMPARISON:  None Available.   FINDINGS: Brain: There is diffusion restriction with associated FLAIR signal abnormality in the left caudate and corona radiata consistent with acute to early subacute infarct. T1 hyperintensity and ill-defined SWI signal dropout is consistent with petechial hemorrhage. There is no mass effect or midline shift.   There is no other evidence of acute infarct or intracranial hemorrhage. There is no extra-axial fluid collection.   Background parenchymal volume is normal for age. The ventricles are normal in size. Patchy FLAIR signal abnormality in the supratentorial white matter and pons likely reflects underlying chronic small-vessel ischemic change, mild for age.   The pituitary and suprasellar region are normal. There is no mass lesion or abnormal enhancement.   Vascular: Normal flow voids.   Skull and upper cervical spine: Normal marrow signal.   Sinuses/Orbits: The paranasal sinuses are clear. The globes and orbits are unremarkable.   Other: The mastoid air cells and middle ear cavities  are clear.   IMPRESSION: Acute to early subacute infarct in the left basal ganglia with petechial hemorrhage but no mass effect or midline shift.   Findings were initially discussed with the MRI technologist at the time of scan acquisition, who was advised to direct the patient to the emergency department. The findings were then discussed with EMS at approximately 11:00 am. Findings discussed with provider RENEE KUNEFF at 11:22 am.   PATIENT REPORTED OUTCOME MEASURES (PROM): Communication Participation Item Bank: 10/30 with lower scores indicates decreased participation in communication events. "0" (very much) indicated with "getting your turn in a fast moving conversation" and "trying to persuade a friend or family member to see a different point of view."   TODAY'S TREATMENT:  DATE:  09/24/23: Pt reports to SLP if she has anomia she is able to think of the word later and this was not the case two weeks ago. SLP told pt about the importance of regular communication in order to practice verbal expression.  Pt states she has two or three ladies she talks to on the phone, regularly and realizes how important it is to talk during the week and will hold to these conversations in weeks and months to come.  HEP was performed and pt had 95% success with self-correction. She and SLP engaged in 20 minutes conversation today about her prior health history and about her church and pt without anomia or dysnomia. She stated she thought she was improving, and she and SLP agreed two more sessions would be warranted.   09/17/23: SLP assisted Pam in completing some of her homework from previous session - req'd min-mod A for >2 definitions, and to put responses in definition form rather than in sentence form. When Pam completed this task providing sentences, it was easier for her  to complete. SLP worked with pt on [employer] to encourage semantic relationships between words, foster carryover of word finding ability, and to provide her with additional language tasks to complete at home. Pam req'd occasional min verbal cues for responses that were complete sentences faded to independence .  09/12/23: Pt arrived on day of last scheduled appointment two hours early and decided not to wait that long, and arrived today 45 minutes early for her appointment.  Today, Pam had questions re: basal ganglia CVA effects on LE movement.  SLP provided infor from [facility] encouraged her to ask her neurologist.  SLP and pt practiced her HEP (consonant laden phrases) and pt had 4 aphasic and one apraxic error. Apraxic error and one aphasic error were self corrected, pt req'd min-mod A for error awarness with other aphasic errors. SLP strongly encouraged pt to slow her rate of speech down so she can be more accurate with syntax and she will have greater opportunity to self correct her errors. SLP provided pt with homework to practice reduced speech rate practicing 20 minutes BID.    09/03/23: SLP assisted and educated pt about how to complete a HEP with consonant-laden words. Pt had semantic paraphasias (mostly difficulty exchanging plurals/non-plural - e.g., valve levers) which she corrected 70% of the time. SLP had to provide initial mod A for deliberate/purposeful articulation, faded to rare min A.    PATIENT EDUCATION: Education details: procedure for HEP, results of eval: suspect aphasia with some apraxia possible, rationale for frequency and duration of ST, how ST will assist pt Person educated: Patient Education method: Explanation, Demonstration, and Verbal cues Education comprehension: verbalized understanding, returned demonstration, verbal cues required, and needs further education   GOALS: Goals reviewed with patient? Yes  SHORT TERM GOALS: Target date:  10/05/23  Pt will complete consonant-laden phrases (HEP) with 100% accuracy Baseline: Goal status: Met  2.  Pt will complete 10 minutes simple-mod complex conversation using compensations in 2 sessions Baseline: 09/24/23 Goal status: INITIAL   LONG TERM GOALS: Target date: 11/05/23  Pt will complete 15 minutes simple-mod complex conversation using compensations in 2 sessions Baseline: 09/24/23 Goal status: INITIAL  2.  Pt will complete consonant-laden phrases (HEP) with 100% accuracy in 2 sessions after 10/05/23 Baseline:  Goal status: INITIAL  3.  Pt will improve PROM score Baseline:  Goal status: INITIAL  4.  Pt will indicate she is more  comfortable to engage in conversation than previous to ST Baseline:  Goal status: INITIAL   ASSESSMENT:  CLINICAL IMPRESSION: Patient is a 72 y.o. F who was seen today for assessment of speech and langauge following CVA in August 2024. Error awareness of aphasic errors during HEP was 90%. Today she and SLP engaged in 20 minutes conversation without aphasic errors, due to pt's intentional rate reduction. SLP introduced the need for pt to reduce speech rate to improve sentence generation and to improve error awareness. She would cont to benefit from skilled ST.   OBJECTIVE IMPAIRMENTS: include aphasia and apraxia. These impairments are limiting patient from household responsibilities, ADLs/IADLs, and effectively communicating at home and in community. Factors affecting potential to achieve goals and functional outcome are cooperation/participation level. Patient will benefit from skilled SLP services to address above impairments and improve overall function.  REHAB POTENTIAL: Good  PLAN:  SLP FREQUENCY: 1x/week  SLP DURATION: 6 weeks  PLANNED INTERVENTIONS: Language facilitation, Internal/external aids, Oral motor exercises, Functional tasks, SLP instruction and feedback, Compensatory strategies, and Patient/family  education    [facility], CCC-SLP 09/24/2023, 12:22 PM

## 2023-09-25 ENCOUNTER — Ambulatory Visit (INDEPENDENT_AMBULATORY_CARE_PROVIDER_SITE_OTHER): Payer: Medicare Other

## 2023-09-25 DIAGNOSIS — E538 Deficiency of other specified B group vitamins: Secondary | ICD-10-CM

## 2023-09-25 MED ORDER — CYANOCOBALAMIN 1000 MCG/ML IJ SOLN
1000.0000 ug | Freq: Once | INTRAMUSCULAR | Status: AC
Start: 2023-09-25 — End: 2023-09-25
  Administered 2023-09-25: 1000 ug via INTRAMUSCULAR

## 2023-09-25 NOTE — Progress Notes (Signed)
Pt here for monthly B12 injection per Dr Claiborne Billings  B12 given IM, and pt tolerated injection well.  Next B12 injection scheduled for 4 weeks.  Pt stated she did pick up b12 prescription. But she feels better coming to [place] to have them administered.

## 2023-09-26 ENCOUNTER — Other Ambulatory Visit: Payer: Self-pay | Admitting: Family Medicine

## 2023-09-26 LAB — VITAMIN B1, WHOLE BLOOD: Vitamin B1 (Thiamine), Blood: 205 nmol/L — ABNORMAL HIGH (ref 78–185)

## 2023-10-01 ENCOUNTER — Ambulatory Visit: Payer: Medicare Other

## 2023-10-01 DIAGNOSIS — R482 Apraxia: Secondary | ICD-10-CM | POA: Diagnosis not present

## 2023-10-01 DIAGNOSIS — R4701 Aphasia: Secondary | ICD-10-CM | POA: Diagnosis not present

## 2023-10-01 NOTE — Therapy (Signed)
OUTPATIENT SPEECH LANGUAGE PATHOLOGY TREATMENT   Patient Name: Ann [name] MRN: [id number] DOB:09-Jun-1951, 72 y.o., female Today's Date: 10/01/2023  PCP: Felix Pacini, DO REFERRING PROVIDER: Durenda Age, PA-C  END OF SESSION:  End of Session - 10/01/23 1240     Visit Number 5    Number of Visits 7    Date for SLP Re-Evaluation 10/19/23    SLP Start Time 1237    SLP Stop Time  1315    SLP Time Calculation (min) 38 min    Activity Tolerance Patient tolerated treatment well               Past Medical History:  Diagnosis Date   Arthritis    osteoarthritis- knees.   Atrial fibrillation (HCC)    Dr. Maisie Fus status post cardioversion chronic anticoagulation   Cellulitis of right lower extremity 12/30/2020   Chicken pox    Colon cancer (HCC) 2001   Radiation, chemo.  Adenocarcinoma of the colon.  Followed by Dr. Kennith Center   Complication of anesthesia    sensitive to narcotics"extra sleepy". Versed- "no memory recall for 4 days postop"   Diabetes mellitus without complication (HCC)    oral and Insulin   Hepatitis    "sub clinical case of Hepatitis in late 70's""has immunities"   Hypertension    Hypothyroidism    Laceration of head 08/04/2019   8 cm laceration forehead/scalp after tripping when mowing her lawn.   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   Ovarian teratoma, right 09/2000   Oophorectomy   Sleep apnea    unable to tolreate cpap   Wisdom teeth extracted    Past Surgical History:  Procedure Laterality Date   ABDOMINAL HYSTERECTOMY  09/2000    Total hysterectomy and oophorectomy   ABDOMINAL WOUND DEHISCENCE Bilateral    multiple times / mesh use with multiple abdominal hernia repair   CARDIOVERSION  2014   Unsuccessful.  Patient reports cardiac ablation was not felt to be appropriate next step by cardiology.   CHOLECYSTECTOMY  04/1998   '98  -Lapraroscopic   COLON RESECTION  2001   "colon cancer"   FOOT SURGERY Bilateral    multiple "hammer toes and bunionectomies"   KNEE ARTHROSCOPY Left 08/2006   meniscectomy   MELANOMA EXCISION     near scapula   TOTAL KNEE ARTHROPLASTY Right 01/24/2016   Procedure: RIGHT TOTAL KNEE ARTHROPLASTY;  Surgeon: Ollen Gross, MD;  Location: [facility];  Service: Orthopedics;  Laterality: Right;   Patient Active Problem List   Diagnosis Date Noted   Expressive aphasia 08/24/2023   Cerebrovascular accident (CVA) due to stenosis of cerebral artery (HCC) 08/24/2023   Basal ganglia stroke (HCC) 07/24/2023   Vertebral artery occlusion, left 07/24/2023   Subclavian artery stenosis (HCC) 07/24/2023   Left carotid artery stenosis 07/24/2023   Internal carotid artery stenosis, bilateral-25% right and 50% left proximal ICA stenosis 07/24/2023   Aortic atherosclerosis (HCC) 07/23/2023   Coronary artery calcification of native artery 07/23/2023   Cardiomegaly 07/23/2023   Diverticulosis 07/23/2023   Thiamine deficiency 07/13/2023   FH: stroke 07/09/2023   Word finding difficulty 07/09/2023   Diarrhea 07/09/2023   Vitamin D deficiency 07/09/2023   Memory changes 07/09/2023   Type 2 diabetes mellitus with hyperglycemia, with long-term current use of insulin (HCC) 08/25/2022   Morbid obesity (HCC) 09/12/2021   Elevated  bilirubin 03/22/2021   Gout 07/23/2020   Sleep disturbance 04/20/2020   Anxiety 04/20/2020   Atrial fibrillation (HCC)    Type 2 diabetes mellitus with hyperlipidemia (HCC)    Hypertension    Hypothyroidism    Chronic vulvitis 11/13/2018   OA (osteoarthritis) of knee 01/24/2016   Mitral valve disease 08/16/2012    ONSET DATE: August 2024   REFERRING DIAG:  I63.50 (ICD-10-CM) - Cerebrovascular accident (CVA) due to stenosis of cerebral artery (HCC)  I63.81 (ICD-10-CM) - Basal ganglia stroke (HCC)  R47.01 (ICD-10-CM) - Expressive aphasia    THERAPY DIAG:  Aphasia  Verbal  apraxia  Rationale for Evaluation and Treatment: Rehabilitation  SUBJECTIVE:   SUBJECTIVE STATEMENT:  "How do I prevent another stroke? It's a big thing."  Pt accompanied by: self  PERTINENT HISTORY: hypertension, hyperlipidemia, A-fib noncompliant with anticoagulation, history of colon cancer in 2001, hypothyroidism, B1 and B12 deficiency, vitamin D deficiency, history of melanoma status post wide excision near scapula, OSA unable to tolerate CPAP  PAIN:  Are you having pain? No  FALLS: Has patient fallen in last 6 months?  No   PATIENT GOALS: "I don't know what to expect."  OBJECTIVE:   DIAGNOSTIC FINDINGS:  MRI HEAD WITHOUT AND WITH CONTRAST  07/18/23 CLINICAL DATA:  Altered mental status, word finding difficulty for 6 weeks.   TECHNIQUE: Multiplanar, multiecho pulse sequences of the brain and surrounding structures were obtained without and with intravenous contrast.   CONTRAST:  10 cc Vueway   COMPARISON:  None Available.   FINDINGS: Brain: There is diffusion restriction with associated FLAIR signal abnormality in the left caudate and corona radiata consistent with acute to early subacute infarct. T1 hyperintensity and ill-defined SWI signal dropout is consistent with petechial hemorrhage. There is no mass effect or midline shift.   There is no other evidence of acute infarct or intracranial hemorrhage. There is no extra-axial fluid collection.   Background parenchymal volume is normal for age. The ventricles are normal in size. Patchy FLAIR signal abnormality in the supratentorial white matter and pons likely reflects underlying chronic small-vessel ischemic change, mild for age.   The pituitary and suprasellar region are normal. There is no mass lesion or abnormal enhancement.   Vascular: Normal flow voids.   Skull and upper cervical spine: Normal marrow signal.   Sinuses/Orbits: The paranasal sinuses are clear. The globes and orbits are unremarkable.    Other: The mastoid air cells and middle ear cavities are clear.   IMPRESSION: Acute to early subacute infarct in the left basal ganglia with petechial hemorrhage but no mass effect or midline shift.   Findings were initially discussed with the MRI technologist at the time of scan acquisition, who was advised to direct the patient to the emergency department. The findings were then discussed with EMS at approximately 11:00 am. Findings discussed with provider RENEE KUNEFF at 11:22 am.   PATIENT REPORTED OUTCOME MEASURES (PROM): Communication Participation Item Bank: 10/30 with lower scores indicates decreased participation in communication events. "0" (very much) indicated with "getting your turn in a fast moving conversation" and "trying to persuade a friend or family member to see a different point of view."   TODAY'S TREATMENT:  DATE:  10/01/23: Pt reiterates she thinks ST is helpful for her. She has had multiple conversations over the weekend and tells SLP she did better than she would have just prior to ST. HEP completed with pt looking at handout and looking up, instead of reading them off the paper. Pam said that was different than previous completion and completed with 93% accuracy. Pt had questions on CVA prevention so SLP printed off information today and reviewed it with her (see "Pt instructions"). Pam expressed thanks for the information and she and SLP agreed she could decr frequency to once every other week due to progress.   09/24/23: Pt reports to SLP if she has anomia she is able to think of the word later and this was not the case two weeks ago. SLP told pt about the importance of regular communication in order to practice verbal expression.  Pt states she has two or three ladies she talks to on the phone, regularly and realizes how important  it is to talk during the week and will hold to these conversations in weeks and months to come.  HEP was performed and pt had 95% success with self-correction. She and SLP engaged in 20 minutes conversation today about her prior health history and about her church and pt without anomia or dysnomia. She stated she thought she was improving, and she and SLP agreed two more sessions would be warranted.   09/17/23: SLP assisted Pam in completing some of her homework from previous session - req'd min-mod A for >2 definitions, and to put responses in definition form rather than in sentence form. When Pam completed this task providing sentences, it was easier for her to complete. SLP worked with pt on [employer] to encourage semantic relationships between words, foster carryover of word finding ability, and to provide her with additional language tasks to complete at home. Pam req'd occasional min verbal cues for responses that were complete sentences faded to independence .  09/12/23: Pt arrived on day of last scheduled appointment two hours early and decided not to wait that long, and arrived today 45 minutes early for her appointment.  Today, Pam had questions re: basal ganglia CVA effects on LE movement.  SLP provided infor from [facility] encouraged her to ask her neurologist.  SLP and pt practiced her HEP (consonant laden phrases) and pt had 4 aphasic and one apraxic error. Apraxic error and one aphasic error were self corrected, pt req'd min-mod A for error awarness with other aphasic errors. SLP strongly encouraged pt to slow her rate of speech down so she can be more accurate with syntax and she will have greater opportunity to self correct her errors. SLP provided pt with homework to practice reduced speech rate practicing 20 minutes BID.    09/03/23: SLP assisted and educated pt about how to complete a HEP with consonant-laden words. Pt had semantic paraphasias (mostly difficulty  exchanging plurals/non-plural - e.g., valve levers) which she corrected 70% of the time. SLP had to provide initial mod A for deliberate/purposeful articulation, faded to rare min A.    PATIENT EDUCATION: Education details: procedure for HEP, results of eval: suspect aphasia with some apraxia possible, rationale for frequency and duration of ST, how ST will assist pt Person educated: Patient Education method: Explanation, Demonstration, and Verbal cues Education comprehension: verbalized understanding, returned demonstration, verbal cues required, and needs further education   GOALS: Goals reviewed with patient? Yes  SHORT TERM GOALS: Target date:  10/05/23  Pt will complete consonant-laden phrases (HEP) with 100% accuracy Baseline: Goal status: Met  2.  Pt will complete 10 minutes simple-mod complex conversation using compensations in 2 sessions Baseline: 09/24/23 Goal status: INITIAL   LONG TERM GOALS: Target date: 11/05/23  Pt will complete 15 minutes simple-mod complex conversation using compensations in 2 sessions Baseline: 09/24/23 Goal status: Met  2.  Pt will complete consonant-laden phrases (HEP) with 100% accuracy in 2 sessions after 10/05/23 Baseline:  Goal status: INITIAL  3.  Pt will improve PROM score Baseline:  Goal status: INITIAL  4.  Pt will indicate she is more comfortable to engage in conversation than previous to ST Baseline:  Goal status: Met   ASSESSMENT:  CLINICAL IMPRESSION: Patient is a 72 y.o. F who was seen today for assessment of speech and langauge following CVA in August 2024. Error awareness of aphasic errors during HEP was 93%. Today she and SLP engaged in 15 minutes conversation without aphasic errors, due to pt's intentional rate reduction. She would cont to benefit from skilled ST at once every other week.  OBJECTIVE IMPAIRMENTS: include aphasia and apraxia. These impairments are limiting patient from household responsibilities,  ADLs/IADLs, and effectively communicating at home and in community. Factors affecting potential to achieve goals and functional outcome are cooperation/participation level. Patient will benefit from skilled SLP services to address above impairments and improve overall function.  REHAB POTENTIAL: Good  PLAN:  SLP FREQUENCY: every other week  SLP DURATION: 6 weeks  PLANNED INTERVENTIONS: Language facilitation, Internal/external aids, Oral motor exercises, Functional tasks, SLP instruction and feedback, Compensatory strategies, and Patient/family education    [facility], CCC-SLP 10/01/2023, 12:43 PM

## 2023-10-01 NOTE — Patient Instructions (Signed)
Stroke prevention info provided  [email or web address]  [email or web address]

## 2023-10-02 NOTE — Telephone Encounter (Signed)
[facility] called in wanting a status on the clearance they sent over

## 2023-10-04 DIAGNOSIS — Z23 Encounter for immunization: Secondary | ICD-10-CM | POA: Diagnosis not present

## 2023-10-09 ENCOUNTER — Ambulatory Visit (INDEPENDENT_AMBULATORY_CARE_PROVIDER_SITE_OTHER): Payer: Medicare Other

## 2023-10-09 DIAGNOSIS — E538 Deficiency of other specified B group vitamins: Secondary | ICD-10-CM | POA: Diagnosis not present

## 2023-10-09 MED ORDER — CYANOCOBALAMIN 1000 MCG/ML IJ SOLN
1000.0000 ug | Freq: Once | INTRAMUSCULAR | Status: AC
Start: 2023-10-09 — End: 2023-10-09
  Administered 2023-10-09: 1000 ug via INTRAMUSCULAR

## 2023-10-09 NOTE — Progress Notes (Signed)
Pt here for monthly B12 injection per Dr Kuneff ? ?B12 1000mcg given IM, and pt tolerated injection well. ? ?Next B12 injection scheduled for 2 weeks ? ?

## 2023-10-15 ENCOUNTER — Ambulatory Visit: Payer: Medicare Other | Attending: Physician Assistant

## 2023-10-15 DIAGNOSIS — R4701 Aphasia: Secondary | ICD-10-CM | POA: Diagnosis not present

## 2023-10-15 DIAGNOSIS — R482 Apraxia: Secondary | ICD-10-CM | POA: Insufficient documentation

## 2023-10-15 NOTE — Therapy (Unsigned)
OUTPATIENT SPEECH LANGUAGE PATHOLOGY TREATMENT/ DISCHARGE   Patient Name: Ann [name] MRN: [id number] DOB:[birth year], 72 y.o., female Today's Date: 10/15/2023  PCP: Felix Pacini, DO REFERRING PROVIDER: Durenda Age, PA-C  END OF SESSION:  End of Session - 10/15/23 1234     Visit Number 6    Number of Visits 7    Date for SLP Re-Evaluation 10/19/23    SLP Start Time 1234    SLP Stop Time  1315    SLP Time Calculation (min) 41 min    Activity Tolerance Patient tolerated treatment well               Past Medical History:  Diagnosis Date   Arthritis    osteoarthritis- knees.   Atrial fibrillation (HCC)    Dr. Maisie Fus status post cardioversion chronic anticoagulation   Cellulitis of right lower extremity 12/30/2020   Chicken pox    Colon cancer (HCC) 2001   Radiation, chemo.  Adenocarcinoma of the colon.  Followed by Dr. Kennith Center   Complication of anesthesia    sensitive to narcotics"extra sleepy". Versed- "no memory recall for 4 days postop"   Diabetes mellitus without complication (HCC)    oral and Insulin   Hepatitis    "sub clinical case of Hepatitis in late 70's""has immunities"   Hypertension    Hypothyroidism    Laceration of head 08/04/2019   8 cm laceration forehead/scalp after tripping when mowing her lawn.   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   Ovarian teratoma, right 09/2000   Oophorectomy   Sleep apnea    unable to tolreate cpap   Wisdom teeth extracted    Past Surgical History:  Procedure Laterality Date   ABDOMINAL HYSTERECTOMY  09/2000    Total hysterectomy and oophorectomy   ABDOMINAL WOUND DEHISCENCE Bilateral    multiple times / mesh use with multiple abdominal hernia repair   CARDIOVERSION  2014   Unsuccessful.  Patient reports cardiac ablation was not felt to be appropriate next step by cardiology.   CHOLECYSTECTOMY   04/1998   '98 -Lapraroscopic   COLON RESECTION  2001   "colon cancer"   FOOT SURGERY Bilateral    multiple "hammer toes and bunionectomies"   KNEE ARTHROSCOPY Left 08/2006   meniscectomy   MELANOMA EXCISION     near scapula   TOTAL KNEE ARTHROPLASTY Right 01/24/2016   Procedure: RIGHT TOTAL KNEE ARTHROPLASTY;  Surgeon: Ollen Gross, MD;  Location: [facility];  Service: Orthopedics;  Laterality: Right;   Patient Active Problem List   Diagnosis Date Noted   Expressive aphasia 08/24/2023   Cerebrovascular accident (CVA) due to stenosis of cerebral artery (HCC) 08/24/2023   Basal ganglia stroke (HCC) 07/24/2023   Vertebral artery occlusion, left 07/24/2023   Subclavian artery stenosis (HCC) 07/24/2023   Left carotid artery stenosis 07/24/2023   Internal carotid artery stenosis, bilateral-25% right and 50% left proximal ICA stenosis 07/24/2023   Aortic atherosclerosis (HCC) 07/23/2023   Coronary artery calcification of native artery 07/23/2023   Cardiomegaly 07/23/2023   Diverticulosis 07/23/2023   Thiamine deficiency 07/13/2023   FH: stroke 07/09/2023   Word finding difficulty 07/09/2023   Diarrhea 07/09/2023   Vitamin D deficiency 07/09/2023   Memory changes 07/09/2023   Type 2 diabetes mellitus with hyperglycemia, with long-term current use of insulin (HCC) 08/25/2022   Morbid obesity (HCC) 09/12/2021  Elevated bilirubin 03/22/2021   Gout 07/23/2020   Sleep disturbance 04/20/2020   Anxiety 04/20/2020   Atrial fibrillation (HCC)    Type 2 diabetes mellitus with hyperlipidemia (HCC)    Hypertension    Hypothyroidism    Chronic vulvitis 11/13/2018   OA (osteoarthritis) of knee 01/24/2016   Mitral valve disease 08/16/2012   SPEECH THERAPY DISCHARGE SUMMARY  Visits from Start of Care: 6  Current functional level related to goals / functional outcomes: See below. Pt feels like her speech is much improved. She met all LTGs   Remaining deficits: Mild intermittent expressive  aphasia,    Education / Equipment: See therapy notes for details.    Patient agrees to discharge. Patient goals were met. Patient is being discharged due to being pleased with the current functional level..      ONSET DATE: August 2024   REFERRING DIAG:  I81.50 (ICD-10-CM) - Cerebrovascular accident (CVA) due to stenosis of cerebral artery (HCC)  I63.81 (ICD-10-CM) - Basal ganglia stroke (HCC)  R47.01 (ICD-10-CM) - Expressive aphasia    THERAPY DIAG:  Aphasia  Verbal apraxia  Rationale for Evaluation and Treatment: Rehabilitation  SUBJECTIVE:   SUBJECTIVE STATEMENT:  "I have a conflict with our last appointment next time; Would you think today could be our last session?"  Pt accompanied by: self  PERTINENT HISTORY: hypertension, hyperlipidemia, A-fib noncompliant with anticoagulation, history of colon cancer in 2001, hypothyroidism, B1 and B12 deficiency, vitamin D deficiency, history of melanoma status post wide excision near scapula, OSA unable to tolerate CPAP  PAIN:  Are you having pain? No  FALLS: Has patient fallen in last 6 months?  No   PATIENT GOALS: "I don't know what to expect."  OBJECTIVE:   DIAGNOSTIC FINDINGS:  MRI HEAD WITHOUT AND WITH CONTRAST  07/18/23 CLINICAL DATA:  Altered mental status, word finding difficulty for 6 weeks.   TECHNIQUE: Multiplanar, multiecho pulse sequences of the brain and surrounding structures were obtained without and with intravenous contrast.   CONTRAST:  10 cc Vueway   COMPARISON:  None Available.   FINDINGS: Brain: There is diffusion restriction with associated FLAIR signal abnormality in the left caudate and corona radiata consistent with acute to early subacute infarct. T1 hyperintensity and ill-defined SWI signal dropout is consistent with petechial hemorrhage. There is no mass effect or midline shift.   There is no other evidence of acute infarct or intracranial hemorrhage. There is no extra-axial fluid  collection.   Background parenchymal volume is normal for age. The ventricles are normal in size. Patchy FLAIR signal abnormality in the supratentorial white matter and pons likely reflects underlying chronic small-vessel ischemic change, mild for age.   The pituitary and suprasellar region are normal. There is no mass lesion or abnormal enhancement.   Vascular: Normal flow voids.   Skull and upper cervical spine: Normal marrow signal.   Sinuses/Orbits: The paranasal sinuses are clear. The globes and orbits are unremarkable.   Other: The mastoid air cells and middle ear cavities are clear.   IMPRESSION: Acute to early subacute infarct in the left basal ganglia with petechial hemorrhage but no mass effect or midline shift.   Findings were initially discussed with the MRI technologist at the time of scan acquisition, who was advised to direct the patient to the emergency department. The findings were then discussed with EMS at approximately 11:00 am. Findings discussed with provider RENEE KUNEFF at 11:22 am.   PATIENT REPORTED OUTCOME MEASURES (PROM): Communication Participation Item Bank: 10/30 with  lower scores indicates decreased participation in communication events. "0" (very much) indicated with "getting your turn in a fast moving conversation" and "trying to persuade a friend or family member to see a different point of view."   TODAY'S TREATMENT:                                                                                                                                         DATE:  10/15/23:  20-minute minimal complex and min-mod complex conversation topics were with WNL language. Pt told SLP she feels like d/c is appropriate at this time - no further concerns. SLP told pt to cont with HEP for 3 more weeks, at least 3 times a week.Marland Kitchen PROM was completed today, with a score of 28/32 (lower scores indicates decreased participation in communication events due to sx),  indicating a better QOL and a decr in pt's sx from initial administration.  10/01/23: Pt reiterates she thinks ST is helpful for her. She has had multiple conversations over the weekend and tells SLP she did better than she would have just prior to ST. HEP completed with pt looking at handout and looking up, instead of reading them off the paper. Pam said that was different than previous completion and completed with 93% accuracy. Pt had questions on CVA prevention so SLP printed off information today and reviewed it with her (see "Pt instructions"). Pam expressed thanks for the information and she and SLP agreed she could decr frequency to once every other week due to progress.   09/24/23: Pt reports to SLP if she has anomia she is able to think of the word later and this was not the case two weeks ago. SLP told pt about the importance of regular communication in order to practice verbal expression.  Pt states she has two or three ladies she talks to on the phone, regularly and realizes how important it is to talk during the week and will hold to these conversations in weeks and months to come.  HEP was performed and pt had 95% success with self-correction. She and SLP engaged in 20 minutes conversation today about her prior health history and about her church and pt without anomia or dysnomia. She stated she thought she was improving, and she and SLP agreed two more sessions would be warranted.   09/17/23: SLP assisted Pam in completing some of her homework from previous session - req'd min-mod A for >2 definitions, and to put responses in definition form rather than in sentence form. When Pam completed this task providing sentences, it was easier for her to complete. SLP worked with pt on [employer] to encourage semantic relationships between words, foster carryover of word finding ability, and to provide her with additional language tasks to complete at home. Pam req'd occasional min verbal  cues for responses that were complete sentences faded to independence .  09/12/23: Pt arrived on day of last scheduled appointment two hours early and decided not to wait that long, and arrived today 45 minutes early for her appointment.  Today, Pam had questions re: basal ganglia CVA effects on LE movement.  SLP provided infor from [facility] encouraged her to ask her neurologist.  SLP and pt practiced her HEP (consonant laden phrases) and pt had 4 aphasic and one apraxic error. Apraxic error and one aphasic error were self corrected, pt req'd min-mod A for error awarness with other aphasic errors. SLP strongly encouraged pt to slow her rate of speech down so she can be more accurate with syntax and she will have greater opportunity to self correct her errors. SLP provided pt with homework to practice reduced speech rate practicing 20 minutes BID.    09/03/23: SLP assisted and educated pt about how to complete a HEP with consonant-laden words. Pt had semantic paraphasias (mostly difficulty exchanging plurals/non-plural - e.g., valve levers) which she corrected 70% of the time. SLP had to provide initial mod A for deliberate/purposeful articulation, faded to rare min A.    PATIENT EDUCATION: Education details: procedure for HEP, results of eval: suspect aphasia with some apraxia possible, rationale for frequency and duration of ST, how ST will assist pt Person educated: Patient Education method: Explanation, Demonstration, and Verbal cues Education comprehension: verbalized understanding, returned demonstration, verbal cues required, and needs further education   GOALS: Goals reviewed with patient? Yes  SHORT TERM GOALS: Target date: 10/05/23  Pt will complete consonant-laden phrases (HEP) with 100% accuracy Baseline: Goal status: Met  2.  Pt will complete 10 minutes simple-mod complex conversation using compensations in 2 sessions Baseline: 09/24/23 Goal status: Partially  met   LONG TERM GOALS: Target date: 11/05/23  Pt will complete 15 minutes simple-mod complex conversation using compensations in 2 sessions Baseline: 09/24/23 Goal status: Met  2.  Pt will complete consonant-laden phrases (HEP) with 100% accuracy in 2 sessions after 10/05/23,  Baseline:  Goal status: Met  3.  Pt will improve PROM score Baseline:  Goal status: Met  4.  Pt will indicate she is more comfortable to engage in conversation than previous to ST Baseline:  Goal status: Met   ASSESSMENT:  CLINICAL IMPRESSION: Patient is a 72 y.o. F who was seen today for treatment of speech and langauge following CVA in August 2024. See today's treatment" for more details. Error awareness of aphasic errors during HEP was 95%. Today she and SLP engaged in 20+ minutes conversation without aphasic errors, due to pt's intentional rate reduction. She agrees with d/c at this time.  OBJECTIVE IMPAIRMENTS: include aphasia and apraxia. These impairments are limiting patient from household responsibilities, ADLs/IADLs, and effectively communicating at home and in community. Factors affecting potential to achieve goals and functional outcome are cooperation/participation level. Patient will benefit from skilled SLP services to address above impairments and improve overall function.  REHAB POTENTIAL: Good  PLAN:  SLP FREQUENCY: every other week  SLP DURATION: 6 weeks  PLANNED INTERVENTIONS: Language facilitation, Internal/external aids, Oral motor exercises, Functional tasks, SLP instruction and feedback, Compensatory strategies, and Patient/family education    [facility], CCC-SLP 10/15/2023, 12:34 PM

## 2023-10-16 NOTE — Telephone Encounter (Signed)
Received clearance paper from [facility]. It is in [employer]

## 2023-10-16 NOTE — Telephone Encounter (Signed)
noted 

## 2023-10-17 ENCOUNTER — Other Ambulatory Visit (HOSPITAL_COMMUNITY): Payer: Self-pay

## 2023-10-17 ENCOUNTER — Telehealth: Payer: Self-pay

## 2023-10-17 DIAGNOSIS — N763 Subacute and chronic vulvitis: Secondary | ICD-10-CM | POA: Diagnosis not present

## 2023-10-17 DIAGNOSIS — L900 Lichen sclerosus et atrophicus: Secondary | ICD-10-CM | POA: Diagnosis not present

## 2023-10-17 NOTE — Telephone Encounter (Signed)
Pharmacy Patient Advocate Encounter   Received notification from Physician's Office that prior authorization for ZOLPIDEM is required/requested.   Insurance verification completed.   The patient is insured through [employer] .   Per test claim: PA required; PA submitted to above mentioned insurance via CoverMyMeds Key/confirmation #/EOC Key: [id number] Status is pending

## 2023-10-17 NOTE — Telephone Encounter (Signed)
Noted  

## 2023-10-17 NOTE — Telephone Encounter (Signed)
Please assist with PA completion for Zolpidem

## 2023-10-18 ENCOUNTER — Other Ambulatory Visit (HOSPITAL_COMMUNITY): Payer: Self-pay

## 2023-10-18 NOTE — Telephone Encounter (Signed)
Addt information has been requested as of 11/7 and has been sent back to the plan as of 11/7.. status is pending

## 2023-10-19 ENCOUNTER — Telehealth: Payer: Self-pay | Admitting: Physician Assistant

## 2023-10-19 NOTE — Telephone Encounter (Signed)
I spoke with patient and clearance was sent to [facility]. Copy sent to scan. Patient thanked me for calling.

## 2023-10-19 NOTE — Telephone Encounter (Signed)
Duwayne Heck called in from [facility]. She stated she has left a few messages and has not heard back. They had received a clearance previously, but it didn't have what they needed on it.

## 2023-10-19 NOTE — Telephone Encounter (Signed)
Patient called and LM on VM 10/18/23. She has been calling for a while and no one called her back. I called her twice this AM 10/19/23 and no answer no VM

## 2023-10-19 NOTE — Telephone Encounter (Signed)
I called no voicemail unable to leave a message at 8:18 10/19/2023. Never received a note she had called.

## 2023-10-19 NOTE — Telephone Encounter (Signed)
Refaxed this am will resend again

## 2023-10-22 ENCOUNTER — Telehealth (HOSPITAL_BASED_OUTPATIENT_CLINIC_OR_DEPARTMENT_OTHER): Payer: Self-pay | Admitting: *Deleted

## 2023-10-22 NOTE — Telephone Encounter (Signed)
Pharmacy please advise on holding Pradaxa for 2 days prior to colonoscopy scheduled for TBD. Hx of Afib and CVA (medication non compliance-07/18/2023). Thank you.

## 2023-10-22 NOTE — Telephone Encounter (Signed)
Pharmacy Patient Advocate Encounter   Received notification from [facility] that Prior Authorization for Zolpidem has been DENIED.  Full denial letter will be uploaded to the media tab. See denial reason below.       PT MUST TRIAL AND FAIL DOXEPIN

## 2023-10-22 NOTE — Telephone Encounter (Signed)
Patient with diagnosis of afib on Pradaxa for anticoagulation.    Procedure: COLONOSCOPY  Date of procedure: TBD   CHA2DS2-VASc Score = 6   This indicates a 9.7% annual risk of stroke. The patient's score is based upon: CHF History: 0 HTN History: 1 Diabetes History: 1 Stroke History: 2 Vascular Disease History: 0 Age Score: 1 Gender Score: 1     CrCl 54 mL/min  Platelet count 151    Per office protocol, patient can hold Pradaxa for 2 days prior to procedure.     **This guidance is not considered finalized until pre-operative APP has relayed final recommendations.**

## 2023-10-22 NOTE — Telephone Encounter (Signed)
   Patient Name: Ann [name]  DOB: 1951/03/21 MRN: [id number]  Primary Cardiologist: Dr. Jacinto Halim  Chart reviewed as part of pre-operative protocol coverage. Given past medical history and time since last visit, based on ACC/AHA guidelines, [name] is at acceptable risk for the planned procedure without further cardiovascular testing.   Procedure: COLONOSCOPY  Date of procedure: TBD     CHA2DS2-VASc Score = 6   This indicates a 9.7% annual risk of stroke. The patient's score is based upon: CHF History: 0 HTN History: 1 Diabetes History: 1 Stroke History: 2 Vascular Disease History: 0 Age Score: 1 Gender Score: 1       CrCl 54 mL/min  Platelet count 151       Per office protocol, patient can hold Pradaxa for 2 days prior to procedure.   I will route this recommendation to the requesting party via Epic fax function and remove from pre-op pool.  Please call with questions.  Joni Reining, NP 10/22/2023, 3:26 PM

## 2023-10-22 NOTE — Telephone Encounter (Signed)
Noted  

## 2023-10-22 NOTE — Telephone Encounter (Signed)
   Pre-operative Risk Assessment    Patient Name: Ann [name]  DOB: 27-Jan-1951 MRN: [id number]      Request for Surgical Clearance    Procedure:   COLONOSCOPY  Date of Surgery:  Clearance TBD                                 Surgeon:  Rinaldo Ratel, MD Surgeon's Group or Practice Name:  [facility] Phone number:  [phone] Fax number:  [phone]   Type of Clearance Requested:   - Pharmacy:  Hold [facility]ig[facility] (Pradaxa) X'S 2 DAYS   Type of Anesthesia:  Not Indicated   Additional requests/questions:    Ann [name]   10/22/2023, 11:54 AM

## 2023-10-23 ENCOUNTER — Ambulatory Visit (INDEPENDENT_AMBULATORY_CARE_PROVIDER_SITE_OTHER): Payer: Medicare Other

## 2023-10-23 DIAGNOSIS — E538 Deficiency of other specified B group vitamins: Secondary | ICD-10-CM

## 2023-10-24 ENCOUNTER — Telehealth: Payer: Self-pay

## 2023-10-24 NOTE — Telephone Encounter (Signed)
Message sent to pt.

## 2023-10-26 MED ORDER — CYANOCOBALAMIN 1000 MCG/ML IJ SOLN
1000.0000 ug | Freq: Once | INTRAMUSCULAR | Status: AC
Start: 2023-10-26 — End: 2023-10-23
  Administered 2023-10-23: 1000 ug via INTRAMUSCULAR

## 2023-10-26 NOTE — Progress Notes (Signed)
Pt here for monthly B12 injection per Dr Kuneff ? ?B12 1000mcg given IM, and pt tolerated injection well. ? ?Next B12 injection scheduled for 2 weeks ? ?

## 2023-11-06 ENCOUNTER — Telehealth: Payer: Self-pay

## 2023-11-06 ENCOUNTER — Ambulatory Visit (INDEPENDENT_AMBULATORY_CARE_PROVIDER_SITE_OTHER): Payer: Medicare Other

## 2023-11-06 DIAGNOSIS — E538 Deficiency of other specified B group vitamins: Secondary | ICD-10-CM

## 2023-11-06 MED ORDER — CYANOCOBALAMIN 1000 MCG/ML IJ SOLN
1000.0000 ug | Freq: Once | INTRAMUSCULAR | Status: AC
  Administered 2023-11-06: 1000 ug via INTRAMUSCULAR

## 2023-11-06 NOTE — Telephone Encounter (Signed)
Pt is wanting to know how long she will need to be on b12. I did not see note after lab draw for b12 instructions. Rx previously sent but pt prefers to come to office.

## 2023-11-06 NOTE — Addendum Note (Signed)
Addended by: Filomena Jungling on: 11/06/2023 10:37 AM   Modules accepted: Orders

## 2023-11-06 NOTE — Progress Notes (Signed)
Pt here for bi weekly  B12 injection per Dr Claiborne Billings   B12 given IM, and pt tolerated injection well.   Next B12 injection scheduled for 2 weeks.  Please sign in absence of pcp

## 2023-11-13 NOTE — Telephone Encounter (Addendum)
07/09/2023-"B12 injections need to be started immediately once a week for 4 weeks, then injection every 2 weeks for 4 doses (in office)."    - Since her body does not absorb oral b12 (given the very low initial levels), she will need to continue every 2-3 weeks B12 injections indefinitely, to keep her levels in normal range.  I have called in the prescription of B12 injections, to her pharmacy 09/19/2023 for her to self administer at home.

## 2023-11-13 NOTE — Telephone Encounter (Signed)
Spoke with patient regarding results/recommendations.  

## 2023-11-14 ENCOUNTER — Ambulatory Visit: Payer: Medicare Other

## 2023-11-16 ENCOUNTER — Telehealth: Payer: Self-pay

## 2023-11-16 NOTE — Telephone Encounter (Signed)
Patient provided information and verbalized understanding

## 2023-11-16 NOTE — Telephone Encounter (Signed)
Patient having a colonoscopy Monday and needs to know how to adjust her insulin.

## 2023-11-20 DIAGNOSIS — K635 Polyp of colon: Secondary | ICD-10-CM | POA: Diagnosis not present

## 2023-11-20 DIAGNOSIS — Z1211 Encounter for screening for malignant neoplasm of colon: Secondary | ICD-10-CM | POA: Diagnosis not present

## 2023-11-20 DIAGNOSIS — K5730 Diverticulosis of large intestine without perforation or abscess without bleeding: Secondary | ICD-10-CM | POA: Diagnosis not present

## 2023-11-20 DIAGNOSIS — D120 Benign neoplasm of cecum: Secondary | ICD-10-CM | POA: Diagnosis not present

## 2023-11-20 DIAGNOSIS — Z860101 Personal history of adenomatous and serrated colon polyps: Secondary | ICD-10-CM | POA: Diagnosis not present

## 2023-11-20 DIAGNOSIS — D122 Benign neoplasm of ascending colon: Secondary | ICD-10-CM | POA: Diagnosis not present

## 2023-11-21 ENCOUNTER — Ambulatory Visit: Payer: Medicare Other

## 2023-11-27 ENCOUNTER — Ambulatory Visit (INDEPENDENT_AMBULATORY_CARE_PROVIDER_SITE_OTHER): Payer: Medicare Other

## 2023-11-27 DIAGNOSIS — E538 Deficiency of other specified B group vitamins: Secondary | ICD-10-CM | POA: Diagnosis not present

## 2023-11-27 MED ORDER — CYANOCOBALAMIN 1000 MCG/ML IJ SOLN
1000.0000 ug | INTRAMUSCULAR | Status: DC
  Administered 2023-11-27 – 2024-01-29 (×4): 1000 ug via INTRAMUSCULAR

## 2023-11-27 NOTE — Progress Notes (Signed)
Pt here for monthly B12 injection per Dr Kuneff ? ?B12 1000mcg given IM, and pt tolerated injection well. ? ?Next B12 injection scheduled for 2 weeks ? ?

## 2023-12-11 ENCOUNTER — Ambulatory Visit: Payer: Medicare Other

## 2023-12-18 ENCOUNTER — Ambulatory Visit (INDEPENDENT_AMBULATORY_CARE_PROVIDER_SITE_OTHER): Payer: Medicare Other

## 2023-12-18 DIAGNOSIS — E538 Deficiency of other specified B group vitamins: Secondary | ICD-10-CM

## 2023-12-18 NOTE — Progress Notes (Signed)
Pt here for monthly B12 injection per Dr Kuneff ? ?B12 1000mcg given IM, and pt tolerated injection well. ? ?Next B12 injection scheduled for 2 weeks ? ?

## 2023-12-31 ENCOUNTER — Ambulatory Visit: Payer: Medicare Other | Admitting: Internal Medicine

## 2023-12-31 NOTE — Progress Notes (Deleted)
Name: Ann [name]  Age/ Sex: 73 y.o., female   MRN/ DOB: [id number], Mar 02, 1951     PCP: Natalia Leatherwood, DO   Reason for Endocrinology Evaluation: Type 2 Diabetes Mellitus  Initial Endocrine Consultative Visit: 09/22/2020    PATIENT IDENTIFIER: Ann [name] is a 73 y.o. female with a past medical history of T2Dm, A. Fib ,HTn and dyslipidemia, Has hx of colon cancer  . The patient has followed with Endocrinology clinic since 2021 for consultative assistance with management of her diabetes.  DIABETIC HISTORY:  Ann [name] was diagnosed with DM in 2013. Intolerant to Metformin.Farxiga and invokana caused genital irritation.Trulicity -caused urinary retention . Her hemoglobin A1c has ranged from 7.6% in 2022, peaking at 8.8% in 2022.    She had a normal thyroid ultrasound in May 2023, which was prompted by thyromegaly on physical exam.  She was followed up by Dr. Everardo All from 2021 until March 2023   Started Ozempic 07/2023 with an A1c of 9.5%   SUBJECTIVE:   During the last visit (07/27/2023): Marland Kitchen A1c 9.5%     Today (12/31/2023): Ann [name] is here for a follow up on diabetes management .She checks her blood sugars multiple times a day through CGM. The patient has had hypoglycemic episodes since the last clinic visit, she is symptomatic with these episodes  She presented to the ED with aphasia, was diagnosed with acute CVA 07/18/2023  She takes 20 units of Novolog once a day with a meal , she takes   Had bug bites  9 weeks ago which made her sick and had been eating one meal a day   She has diarrhea  Denies nausea or vomiting    HOME DIABETES REGIMEN:  Ozempic 0.5 mg weekly Basaglar 46 units daily  Novolog 14 units TIDQAC CF : Novolog (BG-130/15) TIDQAC     Statin: yes ACE-I/ARB: yes    CONTINUOUS GLUCOSE MONITORING RECORD INTERPRETATION    Dates of Recording:1/23-01/15/2023  Sensor description:dexcom  Results statistics:   CGM use % of time 71   Average and SD 225/71  Time in range 28 %  % Time Above 180 35  % Time above 250 36  % Time Below target <1      Glycemic patterns summary: Bg's trend down at night but high during the day   Hyperglycemic episodes  postprandial   Hypoglycemic episodes occurred after a bolus   Overnight periods: variable       DIABETIC COMPLICATIONS: Microvascular complications:  Denies:  Last Eye Exam: Completed 2023  Macrovascular complications:  CVA 07/2023 Denies: CAD, PVD   HISTORY:  Past Medical History:  Past Medical History:  Diagnosis Date   Arthritis    osteoarthritis- knees.   Atrial fibrillation (HCC)    Dr. Maisie Fus status post cardioversion chronic anticoagulation   Cellulitis of right lower extremity 12/30/2020   Chicken pox    Colon cancer (HCC) 2001   Radiation, chemo.  Adenocarcinoma of the colon.  Followed by Dr. Kennith Center   Complication of anesthesia    sensitive to narcotics"extra sleepy". Versed- "no memory recall for 4 days postop"   Diabetes mellitus without complication (HCC)    oral and Insulin   Hepatitis    "sub clinical case of Hepatitis in late 70's""has immunities"   Hypertension    Hypothyroidism    Laceration of head 08/04/2019   8 cm laceration forehead/scalp after tripping when mowing her lawn.   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   Ovarian teratoma, right 09/2000   Oophorectomy   Sleep apnea    unable to tolreate cpap   Wisdom teeth extracted    Past Surgical History:  Past Surgical History:  Procedure Laterality Date   ABDOMINAL HYSTERECTOMY  09/2000    Total hysterectomy and oophorectomy   ABDOMINAL WOUND DEHISCENCE Bilateral    multiple times / mesh use with multiple abdominal hernia repair   CARDIOVERSION  2014   Unsuccessful.  Patient reports cardiac ablation was not felt to be appropriate next step by cardiology.    CHOLECYSTECTOMY  04/1998   '98 -Lapraroscopic   COLON RESECTION  2001   "colon cancer"   FOOT SURGERY Bilateral    multiple "hammer toes and bunionectomies"   KNEE ARTHROSCOPY Left 08/2006   meniscectomy   MELANOMA EXCISION     near scapula   TOTAL KNEE ARTHROPLASTY Right 01/24/2016   Procedure: RIGHT TOTAL KNEE ARTHROPLASTY;  Surgeon: Ollen Gross, MD;  Location: [facility];  Service: Orthopedics;  Laterality: Right;   Social History:  reports that she quit smoking about 26 years ago. Her smoking use included cigarettes. She started smoking about 51 years ago. She has never used smokeless tobacco. She reports that she does not drink alcohol and does not use drugs. Family History:  Family History  Problem Relation Age of Onset   Arthritis Mother    Colon cancer Mother    Diabetes Mother    Atrial fibrillation Mother    Diabetes Father    Heart attack Father    Heart disease Father    Stroke Father    Hypertension Maternal Grandmother    Stroke Maternal Grandmother    Stroke Maternal Grandfather    Hypertension Paternal Grandmother    Stroke Paternal Grandmother    Diabetes Paternal Grandfather    Hypertension Paternal Grandfather    Stroke Paternal Grandfather    Diabetes Brother    Brain cancer Brother    Heart disease Brother    Hypertension Brother    Stroke Brother      HOME MEDICATIONS: Allergies as of 12/31/2023       Reactions   Amoxicillin Shortness Of Breath, Swelling, Anaphylaxis   Has patient had a PCN reaction causing immediate rash, facial/tongue/throat swelling, SOB or lightheadedness with hypotension: Yes Has patient had a PCN reaction causing severe rash involving mucus membranes or skin necrosis: No Has patient had a PCN reaction that required hospitalization No Has patient had a PCN reaction occurring within the last 10 years: Yes If all of the above answers are "NO", then may proceed with Cephalosporin use. Has patient had a PCN reaction causing  immediate rash, facial/tongue/throat swelling, SOB or lightheadedness with hypotension: Yes Has patient had a PCN reaction causing severe rash involving mucus membranes or skin necrosis: No Has patient had a PCN reaction that required hospitalization No Has patient had a PCN reaction occurring within the last 10 years: Yes If all of the above answers are "NO", then may proceed with Cephalosporin use. Has patient had a PCN reaction causing immediate rash, facial/tongue/throat swelling, SOB or lightheadedness with hypotension: Yes Has patient had a PCN reaction causing severe rash involving mucus membranes or skin necrosis: No Has patient had a PCN reaction that required hospitalization No Has patient had a PCN reaction occurring within the last 10 years: Yes If all of the above  answers are "NO", then may proceed with Cephalosporin use.   Ampicillin    Codeine Itching   Tolerates hydrocodone   Ezetimibe    Livalo [pitavastatin]    Statins Other (See Comments)   Muscle skeletal side effects   Welchol [colesevelam]    Gi se        Medication List        Accurate as of December 31, 2023  8:56 AM. If you have any questions, ask your nurse or doctor.          allopurinol 100 MG tablet Commonly known as: ZYLOPRIM Take 1 tablet (100 mg total) by mouth daily.   ALPRAZolam 0.25 MG tablet Commonly known as: XANAX Take 1 tablet (0.25 mg total) by mouth daily as needed for anxiety.   Basaglar KwikPen 100 UNIT/ML Inject 46 Units into the skin every morning. What changed: how much to take   colchicine 0.6 MG tablet 2 tabs at onset of flare, followed by 1 tab 2 hours later. Then continue 1 tab daily until flare is resolved.   cyanocobalamin 1000 MCG/ML injection Commonly known as: VITAMIN B12 Inject 1 mL (1,000 mcg total) into the muscle every 14 (fourteen) days.   dabigatran 150 MG Caps capsule Commonly known as: PRADAXA Take 1 capsule (150 mg total) by mouth 2 (two) times  daily.   Dexcom G6 Sensor Misc Change every 10 days  DX E11.65   Dexcom G6 Transmitter Misc USE AS DIRECTED   EpiPen 2-Pak 0.3 MG/0.3ML Soaj injection Generic drug: EPINEPHrine Inject 0.3 mg into the skin as needed. Bee stings   Fiasp FlexTouch 100 UNIT/ML FlexTouch Pen Generic drug: insulin aspart Max daily 60 units   levothyroxine 50 MCG tablet Commonly known as: SYNTHROID Take 1 tablet (50 mcg total) by mouth daily before breakfast.   metoprolol tartrate 50 MG tablet Commonly known as: LOPRESSOR Take 1 tablet (50 mg total) by mouth 2 (two) times daily.   NEEDLE (DISP) 25 G 25G X 1" Misc Inject 1 mL into the muscle every 14 (fourteen) days.   Novofine Pen Needle 32G X 6 MM Misc Generic drug: Insulin Pen Needle Inject 1 Device into the skin in the morning, at noon, in the evening, and at bedtime. Use as instructed to inject insulin   olmesartan-hydrochlorothiazide 40-25 MG tablet Commonly known as: BENICAR HCT Take 1 tablet by mouth daily.   OneTouch Delica Plus Lancet33G Misc Apply 1 each topically 3 (three) times daily.   OneTouch Ultra test strip Generic drug: glucose blood SMARTSIG:Via Meter   PRESCRIPTION MEDICATION Inject 50,000 mcg as directed every 14 (fourteen) days. Vitamin B12 injections   Repatha SureClick 140 MG/ML Soaj Generic drug: Evolocumab Inject 140 mg into the skin every 14 (fourteen) days.   Semaglutide(0.25 or 0.5MG /DOS) 2 MG/3ML Sopn Inject 0.5 mg into the skin once a week.   SYRINGE-NEEDLE (DISP) 3 ML 25G X 5/8" 3 ML Misc Inject 1 mL into the muscle every 14 (fourteen) days.   thiamine 100 MG tablet Commonly known as: Vitamin B-1 Take 100 mg by mouth daily.   triamcinolone ointment 0.5 % Commonly known as: KENALOG Apply topically 2 (two) times daily as needed.   Vitamin D (Ergocalciferol) 1.25 MG (50000 UNIT) Caps capsule Commonly known as: DRISDOL 1 CAP ON MONDAY AND THURSDAY WITH FOOD   zolpidem 5 MG tablet Commonly known  as: AMBIEN Take 1 tablet (5 mg total) by mouth at bedtime as needed.  OBJECTIVE:   Vital Signs: There were no vitals taken for this visit.  Wt Readings from Last 3 Encounters:  09/18/23 267 lb 12.8 oz (121.5 kg)  08/29/23 269 lb 9.6 oz (122.3 kg)  08/24/23 269 lb (122 kg)     Exam: General: Pt appears well and is in NAD  Right thyroid nodule palpated   Lungs: Clear with good BS bilat   Heart: RRR   Extremities: Non pitting  pretibial edema.   Neuro: MS is good with appropriate affect, pt is alert and Ox3   DM Foot Exam 07/27/2023 The skin of the feet is  without sores or ulcerations, pt noted with discoloration of the 1st and 2nd right toes, cold to the touch  The pedal pulses are 1+ on right and 1+ on left. The sensation is intact to a screening 5.07, 10 gram monofilament bilaterally        DATA REVIEWED:  Lab Results  Component Value Date   HGBA1C 9.5 (H) 07/18/2023   HGBA1C 8.6 (A) 01/15/2023   HGBA1C 8.2 (A) 08/25/2022    Latest Reference Range & Units 07/18/23 11:37  Sodium 135 - 145 mmol/L 137  Potassium 3.5 - 5.1 mmol/L 3.5  Chloride 98 - 111 mmol/L 98  CO2 22 - 32 mmol/L 24  Glucose 70 - 99 mg/dL 161 (H)  BUN 8 - 23 mg/dL 37 (H)  Creatinine 0.96 - 1.00 mg/dL 0.45 (H)  Calcium 8.9 - 10.3 mg/dL 8.9  Anion gap 5 - 15  15    Latest Reference Range & Units 07/18/23 15:30  Total CHOL/HDL Ratio RATIO 5.5  Cholesterol 0 - 200 mg/dL 409 (H)  HDL Cholesterol >40 mg/dL 39 (L)  LDL (calc) 0 - 99 mg/dL 811 (H)  Triglycerides <150 mg/dL 914 (H)  VLDL 0 - 40 mg/dL 43 (H)    Latest Reference Range & Units 09/21/23 15:34  Microalb, Ur mg/dL 0.2  MICROALB/CREAT RATIO <30 mg/g creat 2  Creatinine, Urine 20 - 275 mg/dL 97     Old records , labs and images have been reviewed.     ASSESSMENT / PLAN / RECOMMENDATIONS:   1) Type 2 Diabetes Mellitus, Poorly controlled, without complications - Most recent A1c of 9.5 %. Goal A1c < 7.0 %.     - Pt with  hyperglycemia  -Patient with fear of hypoglycemia which limits optimizing glucose control  -Intolerant to metformin and SGLT2 inhibitors -Historically she has declined GLP 1 agonist due to risk of cancer.  Today we discussed the importance of considering this given her most recent CVA, we discussed the cardiovascular as well as renal benefits as well as GI side effects -I will reduce insulin preemptively as below to prevent hypoglycemia while on Ozempic -She was given patient assistance forms in case Ozempic is cost prohibitive  MEDICATIONS: Start Ozempic 0.25 mg once weekly for 6 weeks, then increase to 0.5 mg weekly Decrease Basaglar to 46 units daily Take NovoLog 14 units before each meal  Correction scale: Novolog (BG-130/15) TIDQAC   EDUCATION / INSTRUCTIONS: BG monitoring instructions: Patient is instructed to check her blood sugars 3 times a day, before meals . Call [facility] if: BG persistently < 70  I reviewed the Rule of 15 for the treatment of hypoglycemia in detail with the patient. Literature supplied.    2) Dyslipidemia :  -LDL above goal.  LDL goal <78 mg/DL -Patient states she has been on multiple statins before with side effects, she could not  recall specific side effects but she is not keen on starting any at this time.  We did discuss Repatha as an option but she does not want to add any more injections at this time -Will discuss this further if no treatment has been started by different provider  F/U in 3 months       Signed electronically by: Lyndle Herrlich, MD  [facility]  [facility] [address] [place], [place] [address] Phone: [phone] FAX: [phone]   CC: Natalia Leatherwood, DO [address] [place] [address] Phone: [phone]  Fax: [phone]  Return to Endocrinology clinic as below: Future Appointments  Date Time Provider Department Center  12/31/2023 12:10 PM  Eknoor Novack, Konrad Dolores, MD [facility] None  01/10/2024 10:15 AM [facility] CLINICAL SUPPORT [facility]  01/16/2024  1:00 PM [facility] 1 [facility]  01/16/2024  1:30 PM [facility] PA [facility]  02/01/2024  4:00 PM Yates Decamp, MD [facility]  02/21/2024 11:30 AM Gwynneth Munson, Sung Amabile, PA-C [facility] None  07/16/2024  8:45 AM [facility] ANNUAL WELLNESS VISIT [facility]

## 2024-01-02 ENCOUNTER — Ambulatory Visit (INDEPENDENT_AMBULATORY_CARE_PROVIDER_SITE_OTHER): Payer: Medicare Other | Admitting: Internal Medicine

## 2024-01-02 ENCOUNTER — Encounter: Payer: Self-pay | Admitting: Internal Medicine

## 2024-01-02 VITALS — Ht 68.0 in | Wt 256.6 lb

## 2024-01-02 DIAGNOSIS — E785 Hyperlipidemia, unspecified: Secondary | ICD-10-CM | POA: Diagnosis not present

## 2024-01-02 DIAGNOSIS — E119 Type 2 diabetes mellitus without complications: Secondary | ICD-10-CM

## 2024-01-02 DIAGNOSIS — E1169 Type 2 diabetes mellitus with other specified complication: Secondary | ICD-10-CM

## 2024-01-02 DIAGNOSIS — E1159 Type 2 diabetes mellitus with other circulatory complications: Secondary | ICD-10-CM | POA: Diagnosis not present

## 2024-01-02 DIAGNOSIS — E1165 Type 2 diabetes mellitus with hyperglycemia: Secondary | ICD-10-CM

## 2024-01-02 DIAGNOSIS — Z794 Long term (current) use of insulin: Secondary | ICD-10-CM | POA: Diagnosis not present

## 2024-01-02 LAB — POCT GLYCOSYLATED HEMOGLOBIN (HGB A1C): Hemoglobin A1C: 7.7 % — AB (ref 4.0–5.6)

## 2024-01-02 LAB — GLUCOSE, POCT (MANUAL RESULT ENTRY): POC Glucose: 195 mg/dL — AB (ref 70–99)

## 2024-01-02 MED ORDER — SEMAGLUTIDE(0.25 OR 0.5MG/DOS) 2 MG/3ML ~~LOC~~ SOPN: 0.5000 mg | PEN_INJECTOR | SUBCUTANEOUS | 3 refills | Status: DC

## 2024-01-02 MED ORDER — NOVOFINE PEN NEEDLE 32G X 6 MM MISC: 1.0000 | Freq: Four times a day (QID) | 3 refills | Status: DC

## 2024-01-02 MED ORDER — DEXCOM G7 SENSOR MISC: 1.0000 | 3 refills | Status: DC

## 2024-01-02 MED ORDER — FIASP FLEXTOUCH 100 UNIT/ML ~~LOC~~ SOPN: PEN_INJECTOR | SUBCUTANEOUS | 3 refills | Status: DC

## 2024-01-02 MED ORDER — BASAGLAR KWIKPEN 100 UNIT/ML ~~LOC~~ SOPN: 46.0000 [IU] | PEN_INJECTOR | SUBCUTANEOUS | 3 refills | Status: DC

## 2024-01-02 NOTE — Patient Instructions (Signed)
-   Ozempic 0.5 mg weekly  - Basaglar 46 units daily  - Novolog 10 units with each meal  - Novolog correctional insulin: ADD extra units on insulin to your meal-time Novolog dose if your blood sugars are higher than 145. Use the scale below to help guide you:   Blood sugar before meal Number of units to inject  145 0 unit  146 - 160 1 units  161 - 175 2 units  176 - 190 3 units  191 - 205 4 units  206 - 220 5 units  221 - 235 6 units  236 - 250 7 units  251 - 265 8 units  266 - 280 9 units   281- 295 10 units      HOW TO TREAT LOW BLOOD SUGARS (Blood sugar LESS THAN 70 MG/DL) Please follow the RULE OF 15 for the treatment of hypoglycemia treatment (when your (blood sugars are less than 70 mg/dL)   STEP 1: Take 15 grams of carbohydrates when your blood sugar is low, which includes:  3-4 GLUCOSE TABS  OR 3-4 OZ OF JUICE OR REGULAR SODA OR ONE TUBE OF GLUCOSE GEL    STEP 2: RECHECK blood sugar in 15 MINUTES STEP 3: If your blood sugar is still low at the 15 minute recheck --> then, go back to STEP 1 and treat AGAIN with another 15 grams of carbohydrates.

## 2024-01-02 NOTE — Progress Notes (Signed)
Name: Ann [name]  Age/ Sex: 73 y.o., female   MRN/ DOB: [id number], 11/03/1951     PCP: Natalia Leatherwood, DO   Reason for Endocrinology Evaluation: Type 2 Diabetes Mellitus  Initial Endocrine Consultative Visit: 09/22/2020    PATIENT IDENTIFIER: Ann [name] is a 73 y.o. female with a past medical history of T2Dm, A. Fib ,HTn and dyslipidemia, Has hx of colon cancer  . The patient has followed with Endocrinology clinic since 2021 for consultative assistance with management of her diabetes.  DIABETIC HISTORY:  Ann [name] was diagnosed with DM in 2013. Intolerant to Metformin.Farxiga and invokana caused genital irritation.Trulicity -caused urinary retention . Her hemoglobin A1c has ranged from 7.6% in 2022, peaking at 8.8% in 2022.    She had a normal thyroid ultrasound in May 2023, which was prompted by thyromegaly on physical exam.  She was followed up by Dr. Everardo All from 2021 until March 2023   Started Ozempic 07/2023 with an A1c of 9.5%   SUBJECTIVE:   During the last visit (07/27/2023): Marland Kitchen A1c 9.5%     Today (01/02/2024): Ann [name] is here for a follow up on diabetes management .She checks her blood sugars multiple times a day through CGM. The patient has had one episode of hypoglycemic episodes since the last clinic visit, she is symptomatic with these episodes   Denies nausea or vomiting in general but this weekend she had an episode of gastroenteritis  She has diarrhea all the time since July, 2024. Ozempic started 07/2023   Has been eating 1 meal a day     HOME DIABETES REGIMEN:  Ozempic 0.5 mg weekly Basaglar 46 units daily  Novolog 10 units TIDQAC CF : Novolog (BG-130/15) TIDQAC     Statin: yes ACE-I/ARB: yes    CONTINUOUS GLUCOSE MONITORING RECORD INTERPRETATION: No receiver today      DIABETIC COMPLICATIONS: Microvascular complications:  Denies:  Last Eye Exam: Completed 2023  Macrovascular complications:  CVA 07/2023 Denies:  CAD, PVD   HISTORY:  Past Medical History:  Past Medical History:  Diagnosis Date   Arthritis    osteoarthritis- knees.   Atrial fibrillation (HCC)    Dr. Maisie Fus status post cardioversion chronic anticoagulation   Cellulitis of right lower extremity 12/30/2020   Chicken pox    Colon cancer (HCC) 2001   Radiation, chemo.  Adenocarcinoma of the colon.  Followed by Dr. Kennith Center   Complication of anesthesia    sensitive to narcotics"extra sleepy". Versed- "no memory recall for 4 days postop"   Diabetes mellitus without complication (HCC)    oral and Insulin   Hepatitis    "sub clinical case of Hepatitis in late 70's""has immunities"   Hypertension    Hypothyroidism    Laceration of head 08/04/2019   8 cm laceration forehead/scalp after tripping when mowing her lawn.   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   Ovarian teratoma, right 09/2000   Oophorectomy   Sleep apnea    unable to tolreate cpap   Wisdom teeth extracted    Past Surgical History:  Past Surgical History:  Procedure Laterality Date   ABDOMINAL HYSTERECTOMY  09/2000    Total hysterectomy and oophorectomy   ABDOMINAL WOUND DEHISCENCE Bilateral    multiple times / mesh use with multiple abdominal hernia repair   CARDIOVERSION  2014   Unsuccessful.  Patient  reports cardiac ablation was not felt to be appropriate next step by cardiology.   CHOLECYSTECTOMY  04/1998   '98 -Lapraroscopic   COLON RESECTION  2001   "colon cancer"   FOOT SURGERY Bilateral    multiple "hammer toes and bunionectomies"   KNEE ARTHROSCOPY Left 08/2006   meniscectomy   MELANOMA EXCISION     near scapula   TOTAL KNEE ARTHROPLASTY Right 01/24/2016   Procedure: RIGHT TOTAL KNEE ARTHROPLASTY;  Surgeon: Ollen Gross, MD;  Location: [facility];  Service: Orthopedics;  Laterality: Right;   Social History:  reports that she quit smoking about  26 years ago. Her smoking use included cigarettes. She started smoking about 51 years ago. She has never used smokeless tobacco. She reports that she does not drink alcohol and does not use drugs. Family History:  Family History  Problem Relation Age of Onset   Arthritis Mother    Colon cancer Mother    Diabetes Mother    Atrial fibrillation Mother    Diabetes Father    Heart attack Father    Heart disease Father    Stroke Father    Hypertension Maternal Grandmother    Stroke Maternal Grandmother    Stroke Maternal Grandfather    Hypertension Paternal Grandmother    Stroke Paternal Grandmother    Diabetes Paternal Grandfather    Hypertension Paternal Grandfather    Stroke Paternal Grandfather    Diabetes Brother    Brain cancer Brother    Heart disease Brother    Hypertension Brother    Stroke Brother      HOME MEDICATIONS: Allergies as of 01/02/2024       Reactions   Amoxicillin Shortness Of Breath, Swelling, Anaphylaxis   Has patient had a PCN reaction causing immediate rash, facial/tongue/throat swelling, SOB or lightheadedness with hypotension: Yes Has patient had a PCN reaction causing severe rash involving mucus membranes or skin necrosis: No Has patient had a PCN reaction that required hospitalization No Has patient had a PCN reaction occurring within the last 10 years: Yes If all of the above answers are "NO", then may proceed with Cephalosporin use. Has patient had a PCN reaction causing immediate rash, facial/tongue/throat swelling, SOB or lightheadedness with hypotension: Yes Has patient had a PCN reaction causing severe rash involving mucus membranes or skin necrosis: No Has patient had a PCN reaction that required hospitalization No Has patient had a PCN reaction occurring within the last 10 years: Yes If all of the above answers are "NO", then may proceed with Cephalosporin use. Has patient had a PCN reaction causing immediate rash, facial/tongue/throat  swelling, SOB or lightheadedness with hypotension: Yes Has patient had a PCN reaction causing severe rash involving mucus membranes or skin necrosis: No Has patient had a PCN reaction that required hospitalization No Has patient had a PCN reaction occurring within the last 10 years: Yes If all of the above answers are "NO", then may proceed with Cephalosporin use.   Ampicillin    Codeine Itching   Tolerates hydrocodone   Ezetimibe    Livalo [pitavastatin]    Statins Other (See Comments)   Muscle skeletal side effects   Welchol [colesevelam]    Gi se        Medication List        Accurate as of January 02, 2024  9:25 AM. If you have any questions, ask your nurse or doctor.          STOP taking these medications  Dexcom G6 Transmitter Misc       TAKE these medications    allopurinol 100 MG tablet Commonly known as: ZYLOPRIM Take 1 tablet (100 mg total) by mouth daily.   ALPRAZolam 0.25 MG tablet Commonly known as: XANAX Take 1 tablet (0.25 mg total) by mouth daily as needed for anxiety.   Basaglar KwikPen 100 UNIT/ML Inject 46 Units into the skin every morning. What changed: how much to take   colchicine 0.6 MG tablet 2 tabs at onset of flare, followed by 1 tab 2 hours later. Then continue 1 tab daily until flare is resolved.   cyanocobalamin 1000 MCG/ML injection Commonly known as: VITAMIN B12 Inject 1 mL (1,000 mcg total) into the muscle every 14 (fourteen) days.   dabigatran 150 MG Caps capsule Commonly known as: PRADAXA Take 1 capsule (150 mg total) by mouth 2 (two) times daily.   Dexcom G7 Sensor Misc 1 Device by Does not apply route as directed. What changed:  how much to take how to take this when to take this additional instructions   EpiPen 2-Pak 0.3 MG/0.3ML Soaj injection Generic drug: EPINEPHrine Inject 0.3 mg into the skin as needed. Bee stings   Fiasp FlexTouch 100 UNIT/ML FlexTouch Pen Generic drug: insulin aspart Max daily 60  units   levothyroxine 50 MCG tablet Commonly known as: SYNTHROID Take 1 tablet (50 mcg total) by mouth daily before breakfast.   metoprolol tartrate 50 MG tablet Commonly known as: LOPRESSOR Take 1 tablet (50 mg total) by mouth 2 (two) times daily.   NEEDLE (DISP) 25 G 25G X 1" Misc Inject 1 mL into the muscle every 14 (fourteen) days.   Novofine Pen Needle 32G X 6 MM Misc Generic drug: Insulin Pen Needle Inject 1 Device into the skin in the morning, at noon, in the evening, and at bedtime. Use as instructed to inject insulin   olmesartan-hydrochlorothiazide 40-25 MG tablet Commonly known as: BENICAR HCT Take 1 tablet by mouth daily.   OneTouch Delica Plus Lancet33G Misc Apply 1 each topically 3 (three) times daily.   OneTouch Ultra test strip Generic drug: glucose blood SMARTSIG:Via Meter   PRESCRIPTION MEDICATION Inject 50,000 mcg as directed every 14 (fourteen) days. Vitamin B12 injections   Repatha SureClick 140 MG/ML Soaj Generic drug: Evolocumab Inject 140 mg into the skin every 14 (fourteen) days.   Semaglutide(0.25 or 0.5MG /DOS) 2 MG/3ML Sopn Inject 0.5 mg into the skin once a week.   SYRINGE-NEEDLE (DISP) 3 ML 25G X 5/8" 3 ML Misc Inject 1 mL into the muscle every 14 (fourteen) days.   thiamine 100 MG tablet Commonly known as: Vitamin B-1 Take 100 mg by mouth daily.   triamcinolone ointment 0.5 % Commonly known as: KENALOG Apply topically 2 (two) times daily as needed.   Vitamin D (Ergocalciferol) 1.25 MG (50000 UNIT) Caps capsule Commonly known as: DRISDOL 1 CAP ON MONDAY AND THURSDAY WITH FOOD   zolpidem 5 MG tablet Commonly known as: AMBIEN Take 1 tablet (5 mg total) by mouth at bedtime as needed.         OBJECTIVE:   Vital Signs: Ht 5\' 8"  (1.727 m)   Wt 256 lb 9.6 oz (116.4 kg)   BMI 39.02 kg/m   Wt Readings from Last 3 Encounters:  01/02/24 256 lb 9.6 oz (116.4 kg)  09/18/23 267 lb 12.8 oz (121.5 kg)  08/29/23 269 lb 9.6 oz (122.3  kg)     Exam: General: Pt appears well and is in NAD  Lungs: Clear with good BS bilat   Heart: RRR   Extremities: Non pitting  pretibial edema.   Neuro: MS is good with appropriate affect, pt is alert and Ox3   DM Foot Exam 07/27/2023 The skin of the feet is  without sores or ulcerations, pt noted with discoloration of the 1st and 2nd right toes, cold to the touch  The pedal pulses are 1+ on right and 1+ on left. The sensation is intact to a screening 5.07, 10 gram monofilament bilaterally        DATA REVIEWED:  Lab Results  Component Value Date   HGBA1C 7.7 (A) 01/02/2024   HGBA1C 9.5 (H) 07/18/2023   HGBA1C 8.6 (A) 01/15/2023    Latest Reference Range & Units 07/18/23 11:37  Sodium 135 - 145 mmol/L 137  Potassium 3.5 - 5.1 mmol/L 3.5  Chloride 98 - 111 mmol/L 98  CO2 22 - 32 mmol/L 24  Glucose 70 - 99 mg/dL 409 (H)  BUN 8 - 23 mg/dL 37 (H)  Creatinine 8.11 - 1.00 mg/dL 9.14 (H)  Calcium 8.9 - 10.3 mg/dL 8.9  Anion gap 5 - 15  15    Latest Reference Range & Units 07/18/23 15:30  Total CHOL/HDL Ratio RATIO 5.5  Cholesterol 0 - 200 mg/dL 782 (H)  HDL Cholesterol >40 mg/dL 39 (L)  LDL (calc) 0 - 99 mg/dL 956 (H)  Triglycerides <150 mg/dL 213 (H)  VLDL 0 - 40 mg/dL 43 (H)    Latest Reference Range & Units 09/21/23 15:34  Microalb, Ur mg/dL 0.2  MICROALB/CREAT RATIO <30 mg/g creat 2  Creatinine, Urine 20 - 275 mg/dL 97     Old records , labs and images have been reviewed.     ASSESSMENT / PLAN / RECOMMENDATIONS:   1) Type 2 Diabetes Mellitus, Sub-optimally  controlled, without complications - Most recent A1c of 7.7 %. Goal A1c < 7.0 %.    -A1c is trending down -In office BG 154 Mg/DL -Intolerant to metformin and SGLT2 inhibitors -Patient forgot her Dexcom receiver today, no glucose data, she was provided with #1 sensor sample and a receiver of Dexcom G7 and a new prescription was sent to her pharmacy -She does have chronic diarrhea, has been evaluated by  GI, it appears that the diarrhea started prior to initiating Ozempic, but I did asked the patient to hold off on taking Ozempic for 2 weeks and keep a close eye on the diarrhea.  If the diarrhea resolves then we will need to change Ozempic, if the diarrhea does not change then she may go back on the Ozempic as it has no impact on the diarrhea. -Unfortunately, she continues to self adjust her prandial insulin, patient states that she takes 10 units of NovoLog every morning regardless of whether she eats a meal or not, she has also been drinking sugar sweetened beverages and using that as a meal to justify taking 10 units of NovoLog even without eating a meal.  I did discuss the risk of hypoglycemia and the importance of taking insulin safely, I did advise the patient to avoid all sugar sweetened beverages and if she is not going to eat a meal but she does have hypoglycemia then she may use the correction scale only -No changes at this time   MEDICATIONS: Continue Ozempic 0.5 mg weekly Continue  Basaglar to 46 units daily Take NovoLog 10 units before each meal  Correction scale: Novolog (BG-130/15) TIDQAC   EDUCATION / INSTRUCTIONS: BG monitoring  instructions: Patient is instructed to check her blood sugars 3 times a day, before meals . Call [facility] clinic if: BG persistently < 70  I reviewed the Rule of 15 for the treatment of hypoglycemia in detail with the patient. Literature supplied.    2) Dyslipidemia :  - Per cardiology on Repatha   F/U in 4 months       Signed electronically by: Lyndle Herrlich, MD  [facility]  [facility] [address] [place] [place], [place] [address] Phone: [phone] FAX: [phone]   CC: Natalia Leatherwood, DO [address] [place] [address] Phone: [phone]  Fax: [phone]  Return to Endocrinology clinic as below: Future Appointments  Date Time Provider Department Center  01/02/2024  10:10 AM Calahan Pak, Konrad Dolores, MD [facility] None  01/10/2024 10:15 AM [facility] CLINICAL SUPPORT [facility]  01/16/2024  1:00 PM [facility] VASC 1 [facility]  01/16/2024  1:30 PM [facility]  02/01/2024  4:00 PM Yates Decamp, MD [facility]  02/21/2024 11:30 AM Marcos Eke, PA-C [facility] None  07/16/2024  8:45 AM [facility] ANNUAL WELLNESS VISIT [facility]

## 2024-01-08 ENCOUNTER — Ambulatory Visit: Payer: Medicare Other

## 2024-01-08 ENCOUNTER — Ambulatory Visit (INDEPENDENT_AMBULATORY_CARE_PROVIDER_SITE_OTHER): Payer: Medicare Other

## 2024-01-08 DIAGNOSIS — E538 Deficiency of other specified B group vitamins: Secondary | ICD-10-CM | POA: Diagnosis not present

## 2024-01-08 NOTE — Progress Notes (Signed)
Pt here for monthly B12 injection per kuneff  B12 given IM, and pt tolerated injection well.  Next B12 injection scheduled for 3 weeks

## 2024-01-10 ENCOUNTER — Ambulatory Visit: Payer: Medicare Other

## 2024-01-11 IMAGING — US US THYROID
1 series · 14 of 25 positions shown · non-contrast
Comparison: None Available.

CLINICAL DATA: Thyromegaly on the right

EXAM:
THYROID ULTRASOUND
TECHNIQUE: Ultrasound examination of the thyroid gland and adjacent soft
tissues was performed.

[Series 1: us thyroid · 0.07mm/px · 14 of 38 slices shown]
[im 1/38]
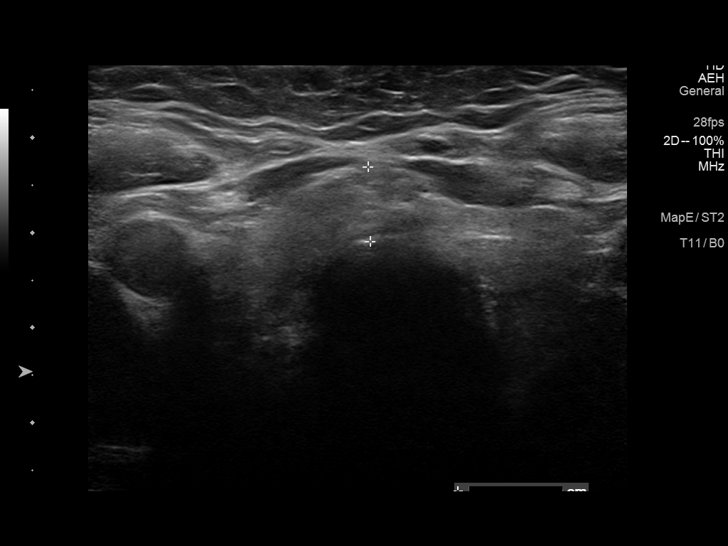
[im 4/38]
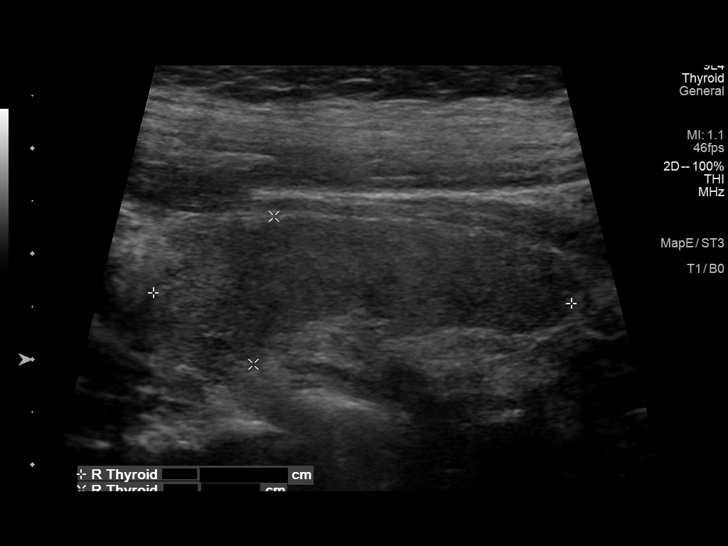
[im 7/38]
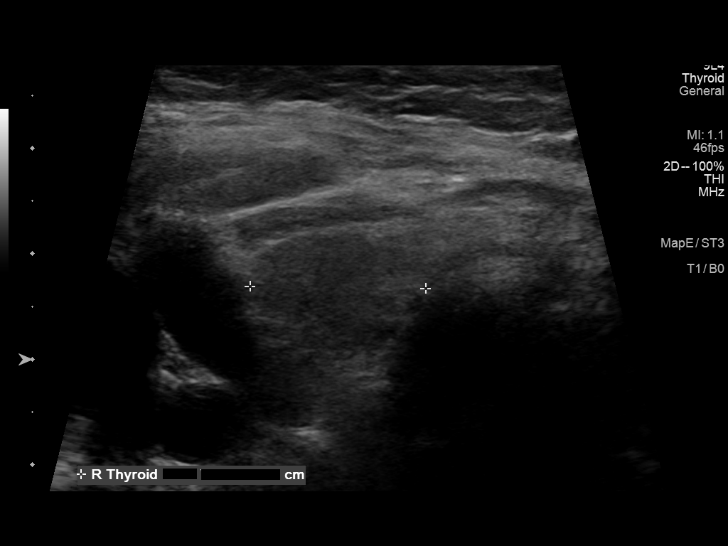
[im 10/38]
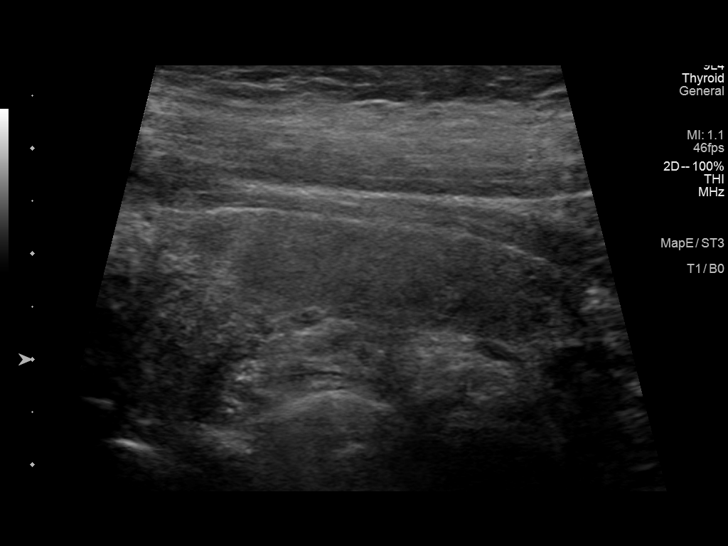
[im 13/38]
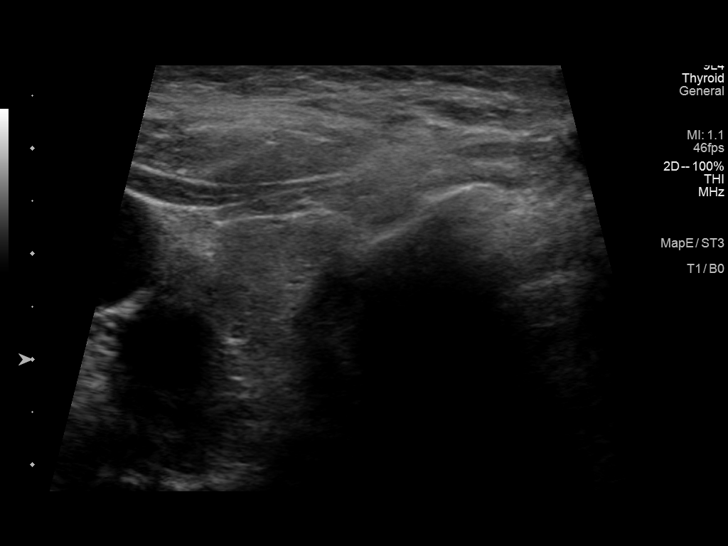
[im 14/38]
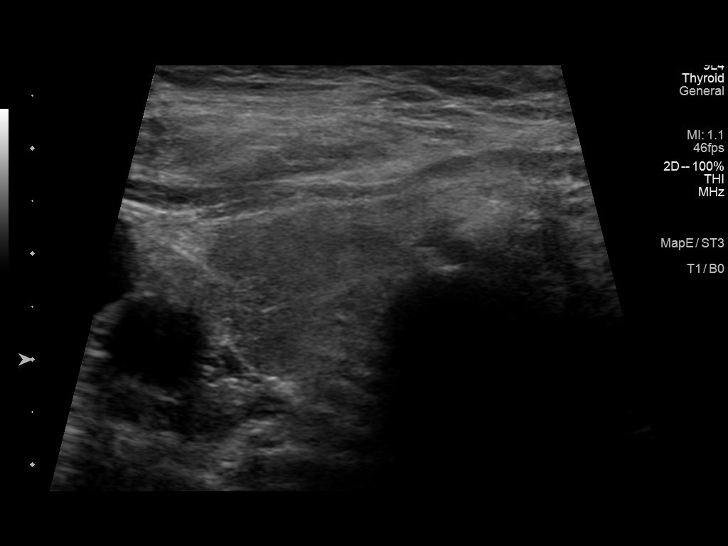
[im 17/38]
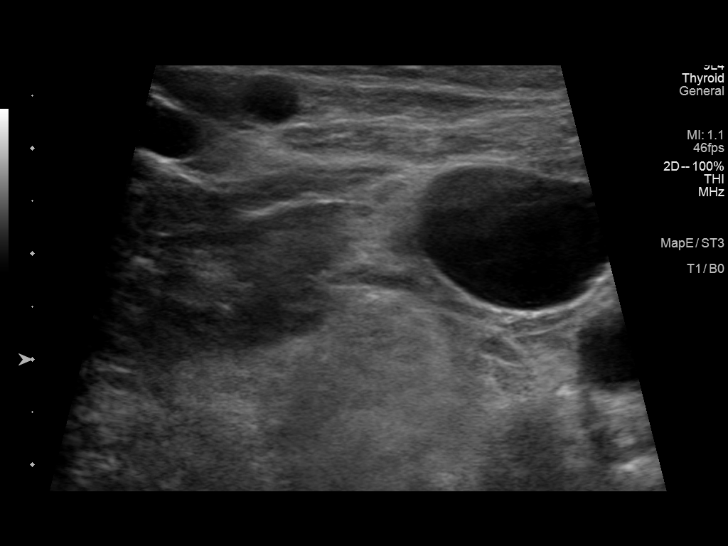
[im 21/38]
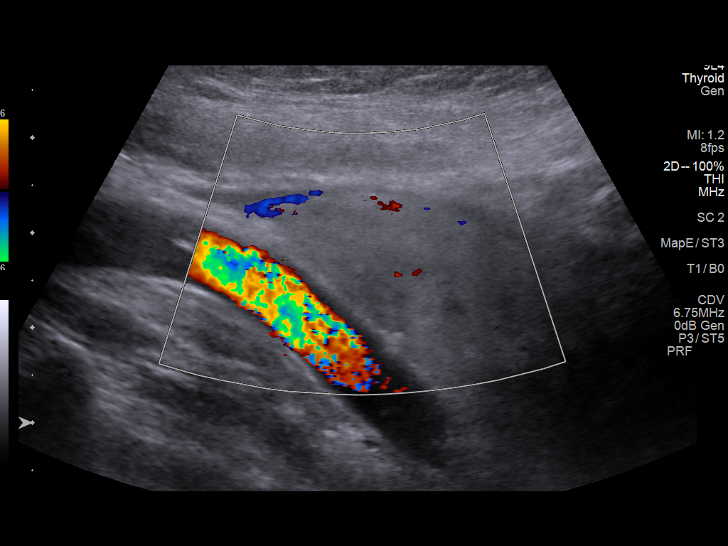
[im 24/38]
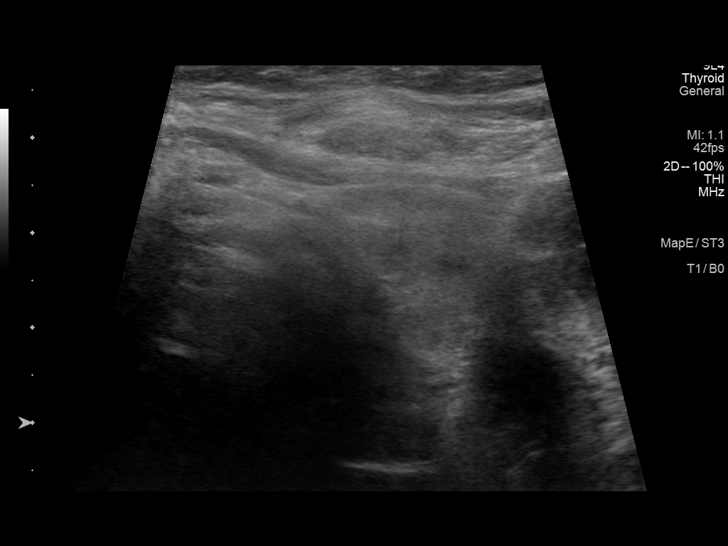
[im 25/38]
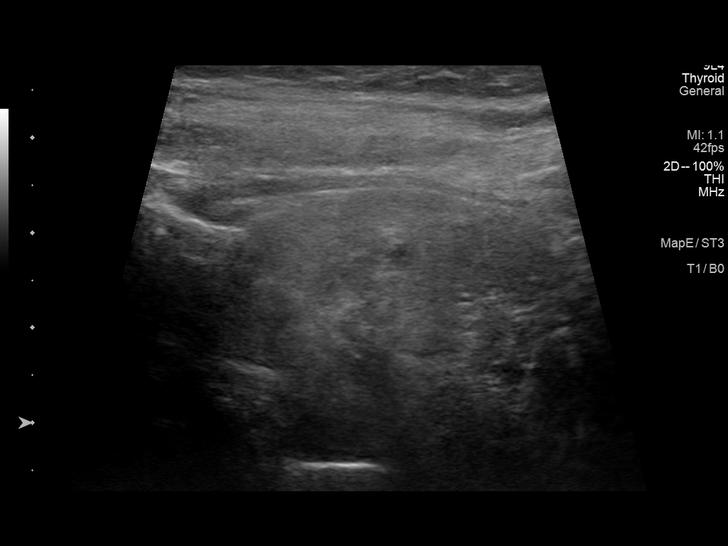
[im 28/38]
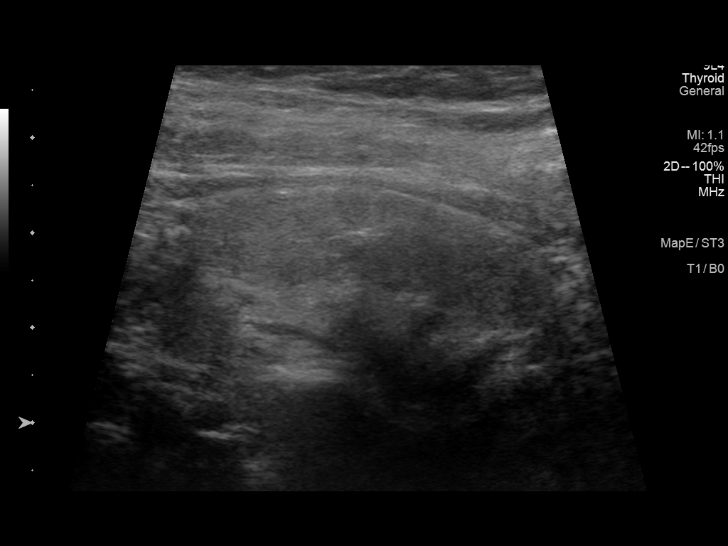
[im 31/38]
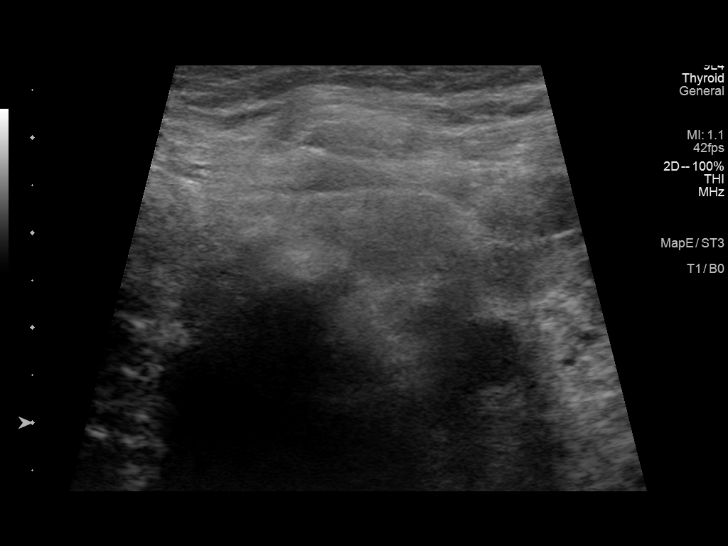
[im 34/38]
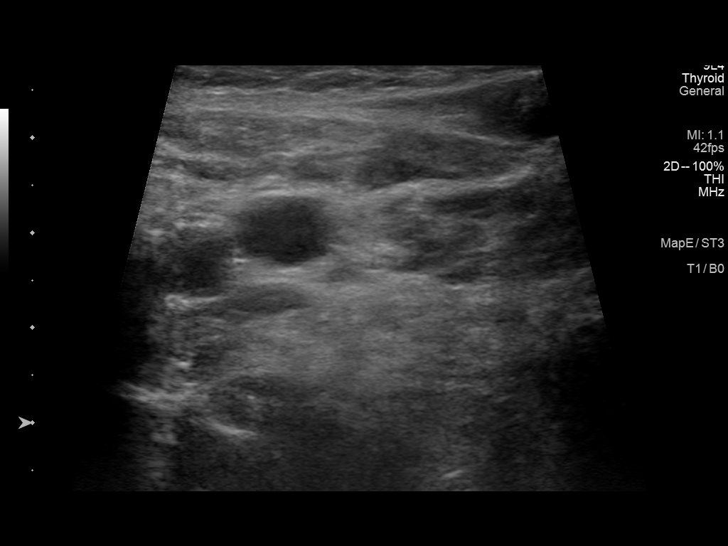
[im 38/38]
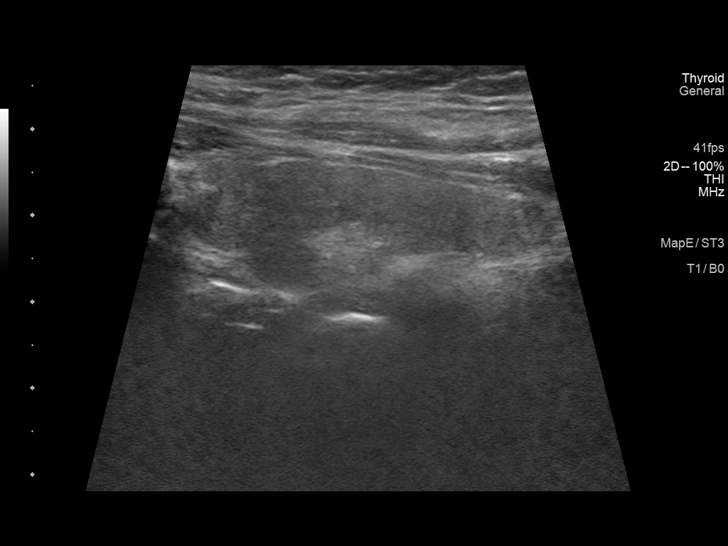

[14 of 25 positions shown; findings below may reference images not displayed]

FINDINGS: Parenchymal Echotexture: Mildly heterogenous

Isthmus: 8 mm

Right lobe: 4.0 x 1.4 x 1.7 cm

Left lobe:  4.3 x 1.5 x 1.6 cm

_________________________________________________________

Estimated total number of nodules >/= 1 cm: 0

Number of spongiform nodules >/=  2 cm not described below (TR1): 0

Number of mixed cystic and solid nodules >/= 1.5 cm not described
below (TR2): 0

_________________________________________________________

Nonspecific minor thyroid heterogeneity. Negative for nodule, mass
or focal abnormality. No regional adenopathy.
IMPRESSION: Normal thyroid ultrasound for age

The above is in keeping with the ACR TI-RADS recommendations - [HOSPITAL] 7078;[DATE].

## 2024-01-16 ENCOUNTER — Ambulatory Visit (INDEPENDENT_AMBULATORY_CARE_PROVIDER_SITE_OTHER): Payer: Medicare Other | Admitting: Physician Assistant

## 2024-01-16 ENCOUNTER — Encounter: Payer: Self-pay | Admitting: Physician Assistant

## 2024-01-16 ENCOUNTER — Ambulatory Visit (HOSPITAL_COMMUNITY)
Admission: RE | Admit: 2024-01-16 | Discharge: 2024-01-16 | Disposition: A | Discharge status: Home / Self Care | Payer: Medicare Other | Source: Ambulatory Visit | Attending: Vascular Surgery | Admitting: Vascular Surgery

## 2024-01-16 VITALS — BP 167/90 | HR 76 | Temp 97.6°F | Resp 22 | Ht 68.0 in | Wt 257.2 lb

## 2024-01-16 DIAGNOSIS — I6523 Occlusion and stenosis of bilateral carotid arteries: Secondary | ICD-10-CM

## 2024-01-16 DIAGNOSIS — I6522 Occlusion and stenosis of left carotid artery: Secondary | ICD-10-CM

## 2024-01-16 NOTE — Progress Notes (Signed)
 Office Note   History of Present Illness   Ann [name] is a 73 y.o. (Aug 20, 1951) female who presents for surveillance of carotid artery stenosis.  She was first evaluated by Dr. [name] in September 2024 after having an episode of expressive aphasia, which was caused by a subacute basal ganglia stroke in August 2024.  At that time she was not compliant with her Pradaxa .  CTA demonstrated stenosis of her internal carotid arteries bilaterally.  Given that her stenosis on the left is heavily calcified and at most 50%, medical therapy was pursued first rather than surgical intervention.   She returns today for follow-up.  She denies any further strokelike symptoms such as slurred speech, facial droop, sudden weakness/numbness, or sudden visual changes.  Her expressive aphasia has resolved after speech therapy.   She is on Pradaxa  and Repatha .  Of note, she does have a known left subclavian artery occlusion but remains asymptomatic without dizziness or syncope.  Current Outpatient Medications  Medication Sig Dispense Refill   allopurinol  (ZYLOPRIM ) 100 MG tablet Take 1 tablet (100 mg total) by mouth daily. 90 tablet 1   ALPRAZolam  (XANAX ) 0.25 MG tablet Take 1 tablet (0.25 mg total) by mouth daily as needed for anxiety. 30 tablet 5   colchicine  0.6 MG tablet 2 tabs at onset of flare, followed by 1 tab 2 hours later. Then continue 1 tab daily until flare is resolved. 30 tablet 5   Continuous Glucose Sensor (DEXCOM G7 SENSOR) MISC 1 Device by Does not apply route as directed. 9 each 3   cyanocobalamin  (VITAMIN B12) 1000 MCG/ML injection Inject 1 mL (1,000 mcg total) into the muscle every 14 (fourteen) days. 1 mL 11   dabigatran  (PRADAXA ) 150 MG CAPS capsule Take 1 capsule (150 mg total) by mouth 2 (two) times daily. 200 capsule 3   EPIPEN 2-PAK 0.3 MG/0.3ML SOAJ injection Inject 0.3 mg into the skin as needed. Bee stings  1   Evolocumab  (REPATHA  SURECLICK) 140 MG/ML SOAJ Inject 140 mg into the  skin every 14 (fourteen) days. 6 mL 3   insulin  aspart (FIASP  FLEXTOUCH) 100 UNIT/ML FlexTouch Pen Max daily 60 units 60 mL 3   Insulin  Glargine (BASAGLAR  KWIKPEN) 100 UNIT/ML Inject 46 Units into the skin every morning. 45 mL 3   Insulin  Pen Needle (NOVOFINE PEN NEEDLE) 32G X 6 MM MISC Inject 1 Device into the skin in the morning, at noon, in the evening, and at bedtime. Use as instructed to inject insulin  400 each 3   Lancets (ONETOUCH DELICA PLUS LANCET33G) MISC Apply 1 each topically 3 (three) times daily.     levothyroxine  (SYNTHROID ) 50 MCG tablet Take 1 tablet (50 mcg total) by mouth daily before breakfast. 90 tablet 3   metoprolol  tartrate (LOPRESSOR ) 50 MG tablet Take 1 tablet (50 mg total) by mouth 2 (two) times daily. 180 tablet 1   NEEDLE, DISP, 25 G 25G X 1 MISC Inject 1 mL into the muscle every 14 (fourteen) days. 2 each 11   olmesartan -hydrochlorothiazide  (BENICAR  HCT) 40-25 MG tablet Take 1 tablet by mouth daily. 90 tablet 1   ONETOUCH ULTRA test strip SMARTSIG:Via Meter     PRESCRIPTION MEDICATION Inject 50,000 mcg as directed every 14 (fourteen) days. Vitamin B12 injections     Semaglutide ,0.25 or 0.5MG /DOS, 2 MG/3ML SOPN Inject 0.5 mg into the skin once a week. 9 mL 3   SYRINGE-NEEDLE, DISP, 3 ML 25G X 5/8 3 ML MISC Inject 1 mL into the  muscle every 14 (fourteen) days. 2 each 11   thiamine  (VITAMIN B-1) 100 MG tablet Take 100 mg by mouth daily.     triamcinolone  ointment (KENALOG ) 0.5 % Apply topically 2 (two) times daily as needed.     Vitamin D , Ergocalciferol , (DRISDOL ) 1.25 MG (50000 UNIT) CAPS capsule 1 CAP ON MONDAY AND THURSDAY WITH FOOD 24 capsule 3   zolpidem  (AMBIEN ) 5 MG tablet Take 1 tablet (5 mg total) by mouth at bedtime as needed. 30 tablet 5   Current Facility-Administered Medications  Medication Dose Route Frequency Provider Last Rate Last Admin   cyanocobalamin  (VITAMIN B12) injection 1,000 mcg  1,000 mcg Intramuscular Q14 Days Kuneff, Renee A, DO   1,000  mcg at 01/08/24 1058    REVIEW OF SYSTEMS (negative unless checked):   Cardiac:  []  Chest pain or chest pressure? []  Shortness of breath upon activity? []  Shortness of breath when lying flat? []  Irregular heart rhythm?  Vascular:  []  Pain in calf, thigh, or hip brought on by walking? []  Pain in feet at night that wakes you up from your sleep? []  Blood clot in your veins? []  Leg swelling?  Pulmonary:  []  Oxygen at home? []  Productive cough? []  Wheezing?  Neurologic:  []  Sudden weakness in arms or legs? []  Sudden numbness in arms or legs? []  Sudden onset of difficult speaking or slurred speech? []  Temporary loss of vision in one eye? []  Problems with dizziness?  Gastrointestinal:  []  Blood in stool? []  Vomited blood?  Genitourinary:  []  Burning when urinating? []  Blood in urine?  Psychiatric:  []  Major depression  Hematologic:  []  Bleeding problems? []  Problems with blood clotting?  Dermatologic:  []  Rashes or ulcers?  Constitutional:  []  Fever or chills?  Ear/Nose/Throat:  []  Change in hearing? []  Nose bleeds? []  Sore throat?  Musculoskeletal:  []  Back pain? []  Joint pain? []  Muscle pain?   Physical Examination   Vitals:   01/16/24 1316 01/16/24 1318  BP: (!) 150/105 (!) 167/90  Pulse: 76   Resp: (!) 22   Temp: 97.6 F (36.4 C)   TempSrc: Temporal   SpO2: 90%   Weight: 257 lb 3.2 oz (116.7 kg)   Height: 5' 8 (1.727 m)    Body mass index is 39.11 kg/m.  General:  WDWN in NAD; vital signs documented above Gait: Not observed HENT: WNL, normocephalic Pulmonary: normal non-labored breathing , without rales, rhonchi,  wheezing Cardiac: regular, without carotid bruit Abdomen: soft, NT, no masses Skin: without rashes Vascular Exam/Pulses: palpable right radial pulse.  Nonpalpable left radial pulse. Extremities: without ischemic changes, without gangrene , without cellulitis; without open wounds;  Musculoskeletal: no muscle wasting or  atrophy  Neurologic: A&O X 3;  No focal weakness or paresthesias are detected Psychiatric:  The pt has Normal affect.  Non-Invasive Vascular Imaging   Bilateral Carotid Duplex (01/16/2024):  R ICA stenosis:  1-39% R VA:  patent and antegrade L ICA stenosis:  1-39% L VA:  patent and antegrade   Medical Decision Making   Ann [name] is a 73 y.o. female who presents for surveillance of carotid artery stenosis  Based on the patient's vascular studies, her carotid artery stenosis is unchanged at 1 to 39% bilaterally She denies any recent strokelike symptoms such as slurred speech, sudden visual changes, facial droop, or sudden weakness/numbness. She states her aphasia from her stroke in 2024 has drastically improved after going through speech therapy She has a palpable right radial pulse.  She has a nonpalpable left radial pulse due to a known subclavian artery occlusion, which is asymptomatic. She should continue her Pradaxa  and Repatha .  She can follow-up with our office in 1 year with carotid duplex   Ahmed Holster PA-C [facility] Office: [phone]  Clinic MD: [name]

## 2024-01-16 NOTE — Progress Notes (Signed)
 Carotid arterial duplex completed. Please see CV Procedures for preliminary results.  Estanislao Heimlich, RVT 01/16/24 1:20 PM

## 2024-01-17 ENCOUNTER — Other Ambulatory Visit: Payer: Self-pay | Admitting: Internal Medicine

## 2024-01-18 ENCOUNTER — Other Ambulatory Visit: Payer: Self-pay

## 2024-01-18 ENCOUNTER — Other Ambulatory Visit: Payer: Self-pay | Admitting: Internal Medicine

## 2024-01-18 MED ORDER — DEXCOM G6 TRANSMITTER MISC: 0 refills | Status: DC

## 2024-01-18 MED ORDER — DEXCOM G7 SENSOR MISC: 1.0000 | 3 refills | Status: DC

## 2024-01-29 ENCOUNTER — Telehealth: Payer: Self-pay

## 2024-01-29 ENCOUNTER — Other Ambulatory Visit: Payer: Self-pay

## 2024-01-29 ENCOUNTER — Ambulatory Visit (INDEPENDENT_AMBULATORY_CARE_PROVIDER_SITE_OTHER): Payer: Medicare Other

## 2024-01-29 ENCOUNTER — Telehealth: Payer: Self-pay | Admitting: Pharmacy Technician

## 2024-01-29 ENCOUNTER — Other Ambulatory Visit: Payer: Self-pay | Admitting: Internal Medicine

## 2024-01-29 ENCOUNTER — Other Ambulatory Visit (HOSPITAL_COMMUNITY): Payer: Self-pay

## 2024-01-29 DIAGNOSIS — E538 Deficiency of other specified B group vitamins: Secondary | ICD-10-CM | POA: Diagnosis not present

## 2024-01-29 DIAGNOSIS — E1165 Type 2 diabetes mellitus with hyperglycemia: Secondary | ICD-10-CM

## 2024-01-29 MED ORDER — NOVOLOG FLEXPEN 100 UNIT/ML ~~LOC~~ SOPN: PEN_INJECTOR | SUBCUTANEOUS | 3 refills | Status: DC

## 2024-01-29 MED ORDER — FIASP FLEXTOUCH 100 UNIT/ML ~~LOC~~ SOPN: PEN_INJECTOR | SUBCUTANEOUS | 3 refills | Status: DC

## 2024-01-29 NOTE — Progress Notes (Signed)
 Pt here for monthly B12 injection per kuneff  B12 given IM, and pt tolerated injection well.  Next B12 injection scheduled for 3 weeks

## 2024-01-29 NOTE — Telephone Encounter (Signed)
Pharmacy Patient Advocate Encounter   Received notification from Pt Calls Messages that prior authorization for [place] [place] is required/requested.   Insurance verification completed.   The patient is insured through [employer] .   Per test claim:  Brand name Novolog is preferred by the insurance.  If suggested medication is appropriate, Please send in a new RX and discontinue this one. If not, please advise as to why it's not appropriate so that we may request a Prior Authorization. Please note, some preferred medications may still require a PA.  If the suggested medications have not been trialed and there are no contraindications to their use, the PA will not be submitted, as it will not be approved.  Copay is $35 for 1 month.

## 2024-01-29 NOTE — Telephone Encounter (Signed)
Fiasp needs PA per pharmacy request

## 2024-01-29 NOTE — Telephone Encounter (Signed)
PA request has been  Received . New Encounter created for follow up. For additional info see Pharmacy Prior Auth telephone encounter from 01/29/24.

## 2024-01-31 ENCOUNTER — Ambulatory Visit: Payer: Self-pay | Admitting: Cardiology

## 2024-02-01 ENCOUNTER — Ambulatory Visit: Payer: Medicare Other | Attending: Cardiology | Admitting: Cardiology

## 2024-02-01 ENCOUNTER — Encounter: Payer: Self-pay | Admitting: Cardiology

## 2024-02-01 VITALS — BP 131/71 | HR 79 | Resp 16 | Ht 68.0 in | Wt 241.8 lb

## 2024-02-01 DIAGNOSIS — T466X5D Adverse effect of antihyperlipidemic and antiarteriosclerotic drugs, subsequent encounter: Secondary | ICD-10-CM

## 2024-02-01 DIAGNOSIS — E782 Mixed hyperlipidemia: Secondary | ICD-10-CM | POA: Insufficient documentation

## 2024-02-01 DIAGNOSIS — I6523 Occlusion and stenosis of bilateral carotid arteries: Secondary | ICD-10-CM | POA: Insufficient documentation

## 2024-02-01 DIAGNOSIS — G720 Drug-induced myopathy: Secondary | ICD-10-CM | POA: Diagnosis not present

## 2024-02-01 DIAGNOSIS — T466X5A Adverse effect of antihyperlipidemic and antiarteriosclerotic drugs, initial encounter: Secondary | ICD-10-CM | POA: Diagnosis not present

## 2024-02-01 DIAGNOSIS — I10 Essential (primary) hypertension: Secondary | ICD-10-CM | POA: Diagnosis not present

## 2024-02-01 DIAGNOSIS — I4821 Permanent atrial fibrillation: Secondary | ICD-10-CM | POA: Insufficient documentation

## 2024-02-01 NOTE — Progress Notes (Signed)
Cardiology Office Note:  .   Date:  02/01/2024  ID:  Ann [name], DOB 05-27-1951, MRN [id number] PCP: Natalia Leatherwood, DO  [facility] Providers Cardiologist:  Yates Decamp, MD   History of Present Illness: .   Ann [name] is a 73 y.o. 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  Stroke was felt to be due to noncompliance with medications.  Since hospital discharge patient has been very compliant, has also been losing weight and essentially remains asymptomatic except not been able to do much physical activity due to severe arthritis in her knees.  Discussed the use of AI scribe software for clinical note transcription with the patient, who gave verbal consent to proceed.  History of Present Illness   The patient, with a history of stroke, carotid stenosis, and diabetes, presents for a follow-up visit. She reports significant improvement in her speech and overall recovery since her stroke. She has not undergone carotid endarterectomy, but recent imaging showed her carotid stenosis to be at 37%. She is currently residing in a care facility, which she finds less stressful than living independently.  The patient has lost 50 pounds, which she attributes to changes in appetite and metabolism following her stroke. She reports not paying much attention to her diet, but notes that she primarily consumes protein. She denies any hearing issues.  The patient has arthritis, which has limited her mobility and she now uses a cane for walking. Despite this, she reports no swelling in her legs, which she believes is due to her weight  loss. She has not been taking Repatha for her cholesterol, as she never received the prescription. She only took one dose that was administered in the doctor's office.  The patient also mentions that she has been feeling tired, but it is unclear whether this is due to her stroke, arthritis, or another condition.      Labs   Lab Results  Component Value Date   CHOL 216 (H) 07/18/2023   HDL 39 (L) 07/18/2023   LDLCALC 134 (H) 07/18/2023   LDLDIRECT 139.0 02/21/2023   TRIG 213 (H) 07/18/2023   CHOLHDL 5.5 07/18/2023   Lab Results  Component Value Date   NA 137 07/18/2023   K 3.5 07/18/2023   CO2 24 07/18/2023   GLUCOSE 126 (H) 07/18/2023   BUN 37 (H) 07/18/2023   CREATININE 1.25 (H) 07/18/2023   CALCIUM 8.9 07/18/2023   GFR 50.79 (L) 07/09/2023   GFRNONAA 45 (L) 07/18/2023      Latest Ref Rng & Units 07/18/2023   11:37 AM 07/09/2023   11:11 AM 02/21/2023   10:09 AM  BMP  Glucose 70 - 99 mg/dL 409  811  914   BUN 8 - 23 mg/dL 37  32  26   Creatinine 0.44 - 1.00 mg/dL 7.82  9.56  2.13   Sodium 135 - 145 mmol/L 137  136  137   Potassium 3.5 - 5.1 mmol/L 3.5  3.7  3.7   Chloride 98 - 111 mmol/L 98  97  99   CO2 22 - 32 mmol/L 24  25  26    Calcium 8.9 - 10.3 mg/dL 8.9  9.4  8.4  Latest Ref Rng & Units 07/18/2023   11:37 AM 07/09/2023   11:11 AM 02/21/2023   10:09 AM  CBC  WBC 4.0 - 10.5 K/uL 7.4  7.4  8.1   Hemoglobin 12.0 - 15.0 g/dL 40.9  81.1  91.4   Hematocrit 36.0 - 46.0 % 41.2  45.4  41.4   Platelets 150 - 400 K/uL 151  147.0  143.0    Lab Results  Component Value Date   HGBA1C 7.7 (A) 01/02/2024    Lab Results  Component Value Date   TSH 2.71 07/09/2023    Review of Systems  Cardiovascular:  Negative for chest pain, dyspnea on exertion and leg swelling.   Physical Exam:   VS:  BP 131/71 (BP Location: Right Arm, Patient Position: Sitting, Cuff Size: Large)   Pulse 79   Resp 16   Ht 5\' 8"  (1.727 m)   Wt 241 lb 12.8 oz (109.7 kg)   SpO2 98%   BMI 36.77  kg/m    Wt Readings from Last 3 Encounters:  02/01/24 241 lb 12.8 oz (109.7 kg)  01/16/24 257 lb 3.2 oz (116.7 kg)  01/02/24 256 lb 9.6 oz (116.4 kg)   Physical Exam Constitutional:      Appearance: She is obese.  Neck:     Vascular: No carotid bruit or JVD.  Cardiovascular:     Rate and Rhythm: Normal rate and regular rhythm.     Pulses: Intact distal pulses.     Heart sounds: Normal heart sounds. No murmur heard.    No gallop.  Pulmonary:     Effort: Pulmonary effort is normal.     Breath sounds: Normal breath sounds.  Abdominal:     General: Bowel sounds are normal.     Palpations: Abdomen is soft.  Musculoskeletal:     Right lower leg: No edema.     Left lower leg: No edema.    Studies Reviewed: .    Carotid artery duplex 01/16/2024: Bilateral common carotid artery <50% stenosis with heterogenous plaque, bilateral ICA 1-39% stenosis with heterogenous plaque. Right antegrade vertebral artery flow, left retrograde flow suggestive of subclavian stenosis.  Normal bilateral subclavian artery hemodynamics.  EKG:         Medications and allergies    Allergies  Allergen Reactions   Amoxicillin Shortness Of Breath, Swelling and Anaphylaxis    Has patient had a PCN reaction causing immediate rash, facial/tongue/throat swelling, SOB or lightheadedness with hypotension: Yes Has patient had a PCN reaction causing severe rash involving mucus membranes or skin necrosis: No Has patient had a PCN reaction that required hospitalization No Has patient had a PCN reaction occurring within the last 10 years: Yes If all of the above answers are "NO", then may proceed with Cephalosporin use.  Has patient had a PCN reaction causing immediate rash, facial/tongue/throat swelling, SOB or lightheadedness with hypotension: Yes Has patient had a PCN reaction causing severe rash involving mucus membranes or skin necrosis: No Has patient had a PCN reaction that required hospitalization No Has  patient had a PCN reaction occurring within the last 10 years: Yes If all of the above answers are "NO", then may proceed with Cephalosporin use. Has patient had a PCN reaction causing immediate rash, facial/tongue/throat swelling, SOB or lightheadedness with hypotension: Yes Has patient had a PCN reaction causing severe rash involving mucus membranes or skin necrosis: No Has patient had a PCN reaction that required hospitalization No Has patient had a PCN reaction occurring within  the last 10 years: Yes If all of the above answers are "NO", then may proceed with Cephalosporin use.    Ampicillin    Codeine Itching    Tolerates hydrocodone   Ezetimibe    Livalo [Pitavastatin]    Statins Other (See Comments)    Muscle skeletal side effects   Welchol [Colesevelam]     Gi se    Current Outpatient Medications:    allopurinol (ZYLOPRIM) 100 MG tablet, Take 1 tablet (100 mg total) by mouth daily., Disp: 90 tablet, Rfl: 1   ALPRAZolam (XANAX) 0.25 MG tablet, Take 1 tablet (0.25 mg total) by mouth daily as needed for anxiety., Disp: 30 tablet, Rfl: 5   colchicine 0.6 MG tablet, 2 tabs at onset of flare, followed by 1 tab 2 hours later. Then continue 1 tab daily until flare is resolved., Disp: 30 tablet, Rfl: 5   Continuous Glucose Sensor (DEXCOM G7 SENSOR) MISC, 1 DEVICE BY DOES NOT APPLY ROUTE AS DIRECTED., Disp: 9 each, Rfl: 3   Continuous Glucose Transmitter (DEXCOM G6 TRANSMITTER) MISC, USE AS DIRECTED **DX.E11.69, Disp: 1 each, Rfl: 3   cyanocobalamin (VITAMIN B12) 1000 MCG/ML injection, Inject 1 mL (1,000 mcg total) into the muscle every 14 (fourteen) days., Disp: 1 mL, Rfl: 11   dabigatran (PRADAXA) 150 MG CAPS capsule, Take 1 capsule (150 mg total) by mouth 2 (two) times daily., Disp: 200 capsule, Rfl: 3   EPIPEN 2-PAK 0.3 MG/0.3ML SOAJ injection, Inject 0.3 mg into the skin as needed. Bee stings, Disp: , Rfl: 1   insulin aspart (NOVOLOG FLEXPEN) 100 UNIT/ML FlexPen, Max daily 60 units,  Disp: 60 mL, Rfl: 3   Insulin Glargine (BASAGLAR KWIKPEN) 100 UNIT/ML, Inject 46 Units into the skin every morning., Disp: 45 mL, Rfl: 3   levothyroxine (SYNTHROID) 50 MCG tablet, Take 1 tablet (50 mcg total) by mouth daily before breakfast., Disp: 90 tablet, Rfl: 3   metoprolol tartrate (LOPRESSOR) 50 MG tablet, Take 1 tablet (50 mg total) by mouth 2 (two) times daily., Disp: 180 tablet, Rfl: 1   olmesartan-hydrochlorothiazide (BENICAR HCT) 40-25 MG tablet, Take 1 tablet by mouth daily., Disp: 90 tablet, Rfl: 1   ONETOUCH ULTRA test strip, SMARTSIG:Via Meter, Disp: , Rfl:    PRESCRIPTION MEDICATION, Inject 50,000 mcg as directed every 14 (fourteen) days. Vitamin B12 injections, Disp: , Rfl:    triamcinolone ointment (KENALOG) 0.5 %, Apply topically 2 (two) times daily as needed., Disp: , Rfl:    Vitamin D, Ergocalciferol, (DRISDOL) 1.25 MG (50000 UNIT) CAPS capsule, 1 CAP ON MONDAY AND THURSDAY WITH FOOD, Disp: 24 capsule, Rfl: 3   zolpidem (AMBIEN) 5 MG tablet, Take 1 tablet (5 mg total) by mouth at bedtime as needed., Disp: 30 tablet, Rfl: 5   ASSESSMENT AND PLAN: .      ICD-10-CM   1. Carotid atherosclerosis, bilateral  I65.23     2. Permanent atrial fibrillation (HCC)  I48.21     3. Mixed hyperlipidemia  E78.2 Lipid panel    Lipid panel    4. Statin myopathy  G72.0    T46.6X5A     5. Primary hypertension  I10      Orders Placed This Encounter  Procedures   Lipid panel    Standing Status:   Future    Number of Occurrences:   1    Expiration Date:   01/31/2025   1. Carotid atherosclerosis, bilateral I reviewed her carotid artery duplex, she has moderate disease in the common carotid  artery and asymptomatic left subclavian artery stenosis.  In view of peripheral arterial disease, prior stroke, she will need to be on aggressive statin therapy however she has not been able to tolerate even minimal doses of statins without severe myalgias making the significantly disabled.  Will make  a referral for evaluation for Leqvio which would be very appropriate for her. Post-Stroke Rehabilitation Significant improvement in speech is noted post-stroke, but walking remains difficult due to orthopedic issues. No new neurological deficits are present. Continue the current rehabilitation regimen.  2. Permanent atrial fibrillation (HCC) Patient is presently on long-term Pradaxa which she is tolerating without any bleeding diathesis.  She does have chronic anemia but hemoglobin has remained stable.  3. Mixed hyperlipidemia As dictated above, goal LDL <70.  Will obtain lipid profile testing as a baseline today and referral sent to infusion center for consideration and prior authorization for Leqvio  4. Statin myopathy Please see above.  She has tried every statin without success including Livalo and ezetimibe and WelChol.  She did tolerate 1 dose of Repatha however in the change of for practice, prior authorization or follow-up not performed.  5. Primary hypertension Blood pressure under excellent control, presently on metoprolol tartrate 50 mg twice daily and olmesartan HCT 40/25 mg daily.  I will see her back in 6 months for follow-up.    Signed,  Yates Decamp, MD, [facility] 02/01/2024, 4:26 PM [facility] [address] [place], [place] [address] Phone: [phone]. Fax:  [phone]

## 2024-02-01 NOTE — Patient Instructions (Signed)
Medication Instructions:  Your physician has recommended you make the following change in your medication:  Leqvio 284 mg/1.5 mL  injection; to be given at the infusion center once insurance is verified  *If you need a refill on your cardiac medications before your next appointment, please call your pharmacy*   Lab Work: FLP  If you have labs (blood work) drawn today and your tests are completely normal, you will receive your results only by: MyChart Message (if you have MyChart) OR A paper copy in the mail If you have any lab test that is abnormal or we need to change your treatment, we will call you to review the results.   Testing/Procedures: NONE   Follow-Up: At [facility], you and your health needs are our priority.  As part of our continuing mission to provide you with exceptional heart care, we have created designated Provider Care Teams.  These Care Teams include your primary Cardiologist (physician) and Advanced Practice Providers (APPs -  Physician Assistants and Nurse Practitioners) who all work together to provide you with the care you need, when you need it.    Your next appointment:   6 month(s)  Provider:   Yates Decamp, MD

## 2024-02-02 LAB — LIPID PANEL
Chol/HDL Ratio: 6 {ratio} — ABNORMAL HIGH (ref 0.0–4.4)
Cholesterol, Total: 221 mg/dL — ABNORMAL HIGH (ref 100–199)
HDL: 37 mg/dL — ABNORMAL LOW (ref >39)
LDL Chol Calc (NIH): 135 mg/dL — ABNORMAL HIGH (ref 0–99)
Triglycerides: 275 mg/dL — ABNORMAL HIGH (ref 0–149)
VLDL Cholesterol Cal: 49 mg/dL — ABNORMAL HIGH (ref 5–40)

## 2024-02-03 ENCOUNTER — Encounter: Payer: Self-pay | Admitting: Cardiology

## 2024-02-03 NOTE — Progress Notes (Signed)
 Patient referral for [facility] sent already

## 2024-02-07 ENCOUNTER — Other Ambulatory Visit: Payer: Self-pay

## 2024-02-07 MED ORDER — INSULIN PEN NEEDLE 29G X 5MM MISC: 3 refills | Status: DC

## 2024-02-12 ENCOUNTER — Other Ambulatory Visit: Payer: Self-pay

## 2024-02-12 ENCOUNTER — Other Ambulatory Visit: Payer: Self-pay | Admitting: Family Medicine

## 2024-02-12 MED ORDER — INSULIN PEN NEEDLE 31G X 6 MM MISC: 3 refills | Status: DC

## 2024-02-12 MED ORDER — NOVOLOG FLEXPEN 100 UNIT/ML ~~LOC~~ SOPN: PEN_INJECTOR | SUBCUTANEOUS | 3 refills | Status: DC

## 2024-02-12 MED ORDER — LEVOTHYROXINE SODIUM 50 MCG PO TABS: 50.0000 ug | ORAL_TABLET | Freq: Every day | ORAL | 0 refills | Status: DC

## 2024-02-12 NOTE — Telephone Encounter (Signed)
 Copied from CRM [id number]. Topic: Clinical - Medication Refill >> Feb 12, 2024  9:34 AM Isabell A wrote: Most Recent Primary Care Visit:  Provider: Filomena Jungling  Department: [facility]  Visit Type: NURSE VISIT  Date: 01/29/2024  Medication: levothyroxine (SYNTHROID) 50 MCG tablet, 90 day supply  Has the patient contacted their pharmacy? No (Agent: If no, request that the patient contact the pharmacy for the refill. If patient does not wish to contact the pharmacy document the reason why and proceed with request.) (Agent: If yes, when and what did the pharmacy advise?)  Is this the correct pharmacy for this prescription? Yes If no, delete pharmacy and type the correct one.  This is the patient's preferred pharmacy:  [facility] - [place], [place] - [address] [place] [address] [place] [address] Phone: [phone] Fax: [phone]   Has the prescription been filled recently? Yes  Is the patient out of the medication? No  Has the patient been seen for an appointment in the last year OR does the patient have an upcoming appointment? Yes  Can we respond through MyChart? No  Agent: Please be advised that Rx refills may take up to 3 business days. We ask that you follow-up with your pharmacy.

## 2024-02-12 NOTE — Telephone Encounter (Signed)
 Pt is due for 5-6 month follow up with PCP including repeat labs.

## 2024-02-12 NOTE — Telephone Encounter (Signed)
 Last Fill: 02/21/23  Last OV: 09/18/23 Next OV: 07/16/24 AWV  Routing to provider for review/authorization.

## 2024-02-12 NOTE — Addendum Note (Signed)
 Addended by: Ovid Curd on: 02/12/2024 02:55 PM   Modules accepted: Orders

## 2024-02-12 NOTE — Telephone Encounter (Signed)
 Pt scheduled for 03/11, requesting script be sent to pharmacy for her Levothyroxine.

## 2024-02-13 ENCOUNTER — Other Ambulatory Visit: Payer: Self-pay

## 2024-02-13 ENCOUNTER — Telehealth: Payer: Self-pay

## 2024-02-13 NOTE — Telephone Encounter (Signed)
 Dr. Jacinto Halim, patient will be scheduled as soon as possible.  Auth Submission: NO AUTH NEEDED Site of care: Site of care: [facility] Payer: Medicare A/B with [employer] supplement Medication & CPT/J Code(s) submitted: Leqvio (Inclisiran) [phone] Route of submission (phone, fax, portal):  Phone # Fax # Auth type: Buy/Bill PB Units/visits requested: 284mg  x 2 doses Reference number:  Approval from: 02/13/24 to 08/15/24

## 2024-02-14 ENCOUNTER — Other Ambulatory Visit: Payer: Self-pay

## 2024-02-14 MED ORDER — INSULIN PEN NEEDLE 29G X 12.7MM MISC: 3 refills | Status: DC

## 2024-02-18 ENCOUNTER — Other Ambulatory Visit: Payer: Self-pay

## 2024-02-19 ENCOUNTER — Ambulatory Visit (INDEPENDENT_AMBULATORY_CARE_PROVIDER_SITE_OTHER): Admitting: Family Medicine

## 2024-02-19 ENCOUNTER — Other Ambulatory Visit: Payer: Self-pay

## 2024-02-19 ENCOUNTER — Encounter: Payer: Self-pay | Admitting: Family Medicine

## 2024-02-19 VITALS — BP 130/80 | HR 82 | Temp 98.2°F | Wt 250.6 lb

## 2024-02-19 DIAGNOSIS — I6523 Occlusion and stenosis of bilateral carotid arteries: Secondary | ICD-10-CM | POA: Diagnosis not present

## 2024-02-19 DIAGNOSIS — E519 Thiamine deficiency, unspecified: Secondary | ICD-10-CM | POA: Diagnosis not present

## 2024-02-19 DIAGNOSIS — E559 Vitamin D deficiency, unspecified: Secondary | ICD-10-CM | POA: Diagnosis not present

## 2024-02-19 DIAGNOSIS — E538 Deficiency of other specified B group vitamins: Secondary | ICD-10-CM

## 2024-02-19 DIAGNOSIS — E785 Hyperlipidemia, unspecified: Secondary | ICD-10-CM

## 2024-02-19 DIAGNOSIS — I700 Atherosclerosis of aorta: Secondary | ICD-10-CM

## 2024-02-19 DIAGNOSIS — M109 Gout, unspecified: Secondary | ICD-10-CM

## 2024-02-19 DIAGNOSIS — E063 Autoimmune thyroiditis: Secondary | ICD-10-CM | POA: Diagnosis not present

## 2024-02-19 DIAGNOSIS — I6522 Occlusion and stenosis of left carotid artery: Secondary | ICD-10-CM | POA: Diagnosis not present

## 2024-02-19 DIAGNOSIS — F419 Anxiety disorder, unspecified: Secondary | ICD-10-CM

## 2024-02-19 DIAGNOSIS — I4821 Permanent atrial fibrillation: Secondary | ICD-10-CM | POA: Diagnosis not present

## 2024-02-19 DIAGNOSIS — I2584 Coronary atherosclerosis due to calcified coronary lesion: Secondary | ICD-10-CM

## 2024-02-19 DIAGNOSIS — G479 Sleep disorder, unspecified: Secondary | ICD-10-CM | POA: Diagnosis not present

## 2024-02-19 DIAGNOSIS — E1169 Type 2 diabetes mellitus with other specified complication: Secondary | ICD-10-CM

## 2024-02-19 DIAGNOSIS — I6350 Cerebral infarction due to unspecified occlusion or stenosis of unspecified cerebral artery: Secondary | ICD-10-CM

## 2024-02-19 DIAGNOSIS — I771 Stricture of artery: Secondary | ICD-10-CM | POA: Diagnosis not present

## 2024-02-19 DIAGNOSIS — I2510 Atherosclerotic heart disease of native coronary artery without angina pectoris: Secondary | ICD-10-CM

## 2024-02-19 DIAGNOSIS — I10 Essential (primary) hypertension: Secondary | ICD-10-CM

## 2024-02-19 DIAGNOSIS — I6381 Other cerebral infarction due to occlusion or stenosis of small artery: Secondary | ICD-10-CM

## 2024-02-19 LAB — COMPREHENSIVE METABOLIC PANEL
ALT: 12 U/L (ref 0–35)
AST: 15 U/L (ref 0–37)
Albumin: 4 g/dL (ref 3.5–5.2)
Alkaline Phosphatase: 80 U/L (ref 39–117)
BUN: 31 mg/dL — ABNORMAL HIGH (ref 6–23)
CO2: 29 meq/L (ref 19–32)
Calcium: 9.4 mg/dL (ref 8.4–10.5)
Chloride: 99 meq/L (ref 96–112)
Creatinine, Ser: 1.09 mg/dL (ref 0.40–1.20)
GFR: 50.01 mL/min — ABNORMAL LOW (ref >60.00)
Glucose, Bld: 215 mg/dL — ABNORMAL HIGH (ref 70–99)
Potassium: 4 meq/L (ref 3.5–5.1)
Sodium: 138 meq/L (ref 135–145)
Total Bilirubin: 0.8 mg/dL (ref 0.2–1.2)
Total Protein: 6.7 g/dL (ref 6.0–8.3)

## 2024-02-19 LAB — CBC
HCT: 43.4 % (ref 36.0–46.0)
Hemoglobin: 14.6 g/dL (ref 12.0–15.0)
MCHC: 33.5 g/dL (ref 30.0–36.0)
MCV: 94.6 fl (ref 78.0–100.0)
Platelets: 166 10*3/uL (ref 150.0–400.0)
RBC: 4.59 Mil/uL (ref 3.87–5.11)
RDW: 14.3 % (ref 11.5–15.5)
WBC: 8.6 10*3/uL (ref 4.0–10.5)

## 2024-02-19 LAB — VITAMIN D 25 HYDROXY (VIT D DEFICIENCY, FRACTURES): VITD: 34.86 ng/mL (ref 30.00–100.00)

## 2024-02-19 LAB — URIC ACID: Uric Acid, Serum: 6.6 mg/dL (ref 2.4–7.0)

## 2024-02-19 MED ORDER — VITAMIN D (ERGOCALCIFEROL) 1.25 MG (50000 UNIT) PO CAPS: ORAL_CAPSULE | ORAL | 3 refills | Status: DC

## 2024-02-19 MED ORDER — CYANOCOBALAMIN 1000 MCG/ML IJ SOLN
1000.0000 ug | Freq: Once | INTRAMUSCULAR | Status: AC
  Administered 2024-02-19: 1000 ug via INTRAMUSCULAR

## 2024-02-19 MED ORDER — METOPROLOL TARTRATE 50 MG PO TABS: 50.0000 mg | ORAL_TABLET | Freq: Two times a day (BID) | ORAL | 1 refills | Status: DC

## 2024-02-19 MED ORDER — ALPRAZOLAM 0.25 MG PO TABS: 0.2500 mg | ORAL_TABLET | Freq: Every day | ORAL | 5 refills | Status: DC | PRN

## 2024-02-19 MED ORDER — BD PEN NEEDLE MICRO U/F 32G X 6 MM MISC: 3 refills | Status: DC

## 2024-02-19 MED ORDER — ZOLPIDEM TARTRATE 5 MG PO TABS: 5.0000 mg | ORAL_TABLET | Freq: Every evening | ORAL | 5 refills | Status: DC | PRN

## 2024-02-19 MED ORDER — OLMESARTAN MEDOXOMIL-HCTZ 40-25 MG PO TABS
1.0000 | ORAL_TABLET | Freq: Every day | ORAL | 1 refills | Status: DC
Start: 2024-02-19 — End: 2024-08-04

## 2024-02-19 MED ORDER — ALLOPURINOL 100 MG PO TABS: 100.0000 mg | ORAL_TABLET | Freq: Every day | ORAL | 1 refills | Status: DC

## 2024-02-19 MED ORDER — LEVOTHYROXINE SODIUM 50 MCG PO TABS
50.0000 ug | ORAL_TABLET | Freq: Every day | ORAL | 3 refills | Status: DC
Start: 2024-02-19 — End: 2024-08-04

## 2024-02-19 NOTE — Progress Notes (Signed)
 Patient ID: Ann [name], female  DOB: [birth year], 73 y.o.   MRN: [id number] Patient Care Team    Relationship Specialty Notifications Start End  Natalia Leatherwood, DO PCP - General Family Medicine  07/18/23   Yates Decamp, MD PCP - Cardiology Cardiology  02/01/24   Yates Decamp, MD Consulting Physician Cardiology  04/20/20   Cherlyn Roberts, MD Referring Physician Dermatology  04/20/20   Jill Side, OD Referring Physician   04/20/20   Derrill Memo, MD Referring Physician Orthotics  04/20/20   Frankey Poot, MD Referring Physician Obstetrics and Gynecology  04/20/20   Konrad Penta, MD Referring Physician Gastroenterology  04/20/20   Shamleffer, Konrad Dolores, MD Attending Physician Endocrinology  01/31/23     Chief Complaint  Patient presents with   Hypertension    Subjective: Ann [name] is a 73 y.o.  female present for Chronic Conditions/illness Management and follow up on recent stroke, word finding difficulties and vitamin deficiencies b1, b12 and vit d.  HTN/HLD/morbid obesity/atrial fibrillation: Pt reports compliance with metoprolol 50 mg twice daily and olmesartan-HCTZ 40-25 mg daily. Patient denies chest pain, shortness of breath, dizziness or lower extremity edema.  Pt declined statin. RF: Hypertension, hyperlipidemia, obesity, diabetes, fib, family history of heart disease  Gout: Patient reports compliance with allopurinol 100 mg daily.  Sleep disturbance/anxiety Patient reports compliance with Ambien 5 mg nightly.  Without use she is not able to sleep.  She will take Xanax at times, she uses Xanax as needed only for increased anxiety or unable to fall asleep.  She denies oversedation.       07/11/2023    9:10 AM 07/09/2023   11:39 AM 04/04/2023    3:51 PM 07/05/2022    9:55 AM 02/14/2022   10:59 AM  Depression screen PHQ 2/9  Decreased Interest 0 0 0 0 0  Down, Depressed, Hopeless 0 0 0 0 0  PHQ - 2 Score 0 0 0 0 0  Altered sleeping     3  Tired,  decreased energy     0  Change in appetite     0  Feeling bad or failure about yourself      0  Trouble concentrating     0  Moving slowly or fidgety/restless     0  Suicidal thoughts     0  PHQ-9 Score     3      02/14/2022   11:01 AM 09/12/2021   11:08 AM  GAD 7 : Generalized Anxiety Score  Nervous, Anxious, on Edge 0 1  Control/stop worrying 0 0  Worry too much - different things 0 0  Trouble relaxing 0 0  Restless 0 0  Easily annoyed or irritable 0 0  Afraid - awful might happen 0 0  Total GAD 7 Score 0 1          08/24/2023    8:02 AM 07/11/2023    9:12 AM 07/09/2023   11:39 AM 04/04/2023    3:50 PM 07/05/2022    9:57 AM  Fall Risk   Falls in the past year? 0 0 0 0 0  Number falls in past yr: 0 0 0 0 0  Injury with Fall? 0 0 0 0 0  Risk for fall due to :  Impaired vision;Impaired balance/gait   Impaired vision;Impaired mobility;Impaired balance/gait  Follow up Falls evaluation completed Falls prevention discussed Falls evaluation completed Falls evaluation completed Falls prevention discussed    Immunization History  Administered Date(s) Administered   Fluad Quad(high Dose 65+) 09/26/2021, 09/16/2022   Fluad Trivalent(High Dose 65+) 09/18/2023   Influenza-Unspecified 09/26/2019   Moderna SARS-COV2 Booster Vaccination 09/02/2021   Moderna Sars-Covid-2 Vaccination 01/22/2020, 02/20/2020, 10/20/2020   Pfizer(Comirnaty)Fall Seasonal Vaccine 12 years and older 10/02/2022, 10/04/2023   Pneumococcal Conjugate-13 10/12/2014   Pneumococcal Polysaccharide-23 10/21/2019   Tdap 08/04/2019    No results found.  Past Medical History:  Diagnosis Date   Arthritis    osteoarthritis- knees.   Atrial fibrillation (HCC)    Dr. Maisie Fus status post cardioversion chronic anticoagulation   Carotid artery occlusion    Cellulitis of right lower extremity 12/30/2020   Chicken pox    Colon cancer (HCC) 2001   Radiation, chemo.  Adenocarcinoma of the colon.  Followed by Dr. Kennith Center    Complication of anesthesia    sensitive to narcotics"extra sleepy". Versed- "no memory recall for 4 days postop"   Diabetes mellitus without complication (HCC)    oral and Insulin   Diarrhea 07/09/2023   Hepatitis    "sub clinical case of Hepatitis in late 70's""has immunities"   Hypertension    Hypothyroidism    Laceration of head 08/04/2019   8 cm laceration forehead/scalp after tripping when mowing her lawn.   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   Ovarian teratoma, right 09/2000   Oophorectomy   Sleep apnea    unable to tolreate cpap   Wisdom teeth extracted    Word finding difficulty 07/09/2023   Allergies  Allergen Reactions   Amoxicillin Shortness Of Breath, Swelling and Anaphylaxis    Has patient had a PCN reaction causing immediate rash, facial/tongue/throat swelling, SOB or lightheadedness with hypotension: Yes Has patient had a PCN reaction causing severe rash involving mucus membranes or skin necrosis: No Has patient had a PCN reaction that required hospitalization No Has patient had a PCN reaction occurring within the last 10 years: Yes If all of the above answers are "NO", then may proceed with Cephalosporin use.  Has patient had a PCN reaction causing immediate rash, facial/tongue/throat swelling, SOB or lightheadedness with hypotension: Yes Has patient had a PCN reaction causing severe rash involving mucus membranes or skin necrosis: No Has patient had a PCN reaction that required hospitalization No Has patient had a PCN reaction occurring within the last 10 years: Yes If all of the above answers are "NO", then may proceed with Cephalosporin use. Has patient had a PCN reaction causing immediate rash, facial/tongue/throat swelling, SOB or lightheadedness with hypotension: Yes Has patient had a PCN reaction causing severe rash involving mucus membranes or  skin necrosis: No Has patient had a PCN reaction that required hospitalization No Has patient had a PCN reaction occurring within the last 10 years: Yes If all of the above answers are "NO", then may proceed with Cephalosporin use.    Ampicillin    Codeine Itching    Tolerates hydrocodone   Ezetimibe    Livalo [Pitavastatin]    Statins Other (See Comments)    Muscle skeletal side effects   Welchol [Colesevelam]     Gi se   Past Surgical History:  Procedure Laterality Date   ABDOMINAL HYSTERECTOMY  09/2000    Total hysterectomy and oophorectomy   ABDOMINAL WOUND DEHISCENCE Bilateral    multiple times / mesh use with multiple abdominal hernia repair   CARDIOVERSION  2014   Unsuccessful.  Patient reports cardiac ablation was not felt to be appropriate next step by cardiology.   CHOLECYSTECTOMY  04/1998   '98 -Lapraroscopic   COLON RESECTION  2001   "colon cancer"   FOOT SURGERY Bilateral    multiple "hammer toes and bunionectomies"   KNEE ARTHROSCOPY Left 08/2006   meniscectomy   MELANOMA EXCISION     near scapula   TOTAL KNEE ARTHROPLASTY Right 01/24/2016   Procedure: RIGHT TOTAL KNEE ARTHROPLASTY;  Surgeon: Ollen Gross, MD;  Location: [facility];  Service: Orthopedics;  Laterality: Right;   Family History  Problem Relation Age of Onset   Arthritis Mother    Colon cancer Mother    Diabetes Mother    Atrial fibrillation Mother    Diabetes Father    Heart attack Father    Heart disease Father    Stroke Father    Hypertension Maternal Grandmother    Stroke Maternal Grandmother    Stroke Maternal Grandfather    Hypertension Paternal Grandmother    Stroke Paternal Grandmother    Diabetes Paternal Grandfather    Hypertension Paternal Grandfather    Stroke Paternal Grandfather    Diabetes Brother    Brain cancer Brother    Heart disease Brother    Hypertension Brother    Stroke Brother    Social History   Social History Narrative   Marital status/children/pets:  Single. G0P0.   Education/employment: Retired Charity fundraiser:      -smoke alarm in the home:Yes     - wears seatbelt: Yes     - Feels safe in their relationships: Yes   Right handed   Drinks caffeine prn   Lives alone   Two story home   retired    Allergies as of 02/19/2024       Reactions   Amoxicillin Shortness Of Breath, Swelling, Anaphylaxis   Has patient had a PCN reaction causing immediate rash, facial/tongue/throat swelling, SOB or lightheadedness with hypotension: Yes Has patient had a PCN reaction causing severe rash involving mucus membranes or skin necrosis: No Has patient had a PCN reaction that required hospitalization No Has patient had a PCN reaction occurring within the last 10 years: Yes If all of the above answers are "NO", then may proceed with Cephalosporin use. Has patient had a PCN reaction causing immediate rash, facial/tongue/throat swelling, SOB or lightheadedness with hypotension: Yes Has patient had a PCN reaction causing severe rash involving mucus membranes or skin necrosis: No Has patient had a PCN reaction that required hospitalization No Has patient had a PCN reaction occurring within the last 10 years: Yes If all of the above answers are "NO", then may proceed with Cephalosporin use. Has patient had a PCN reaction causing immediate rash, facial/tongue/throat swelling, SOB or lightheadedness with hypotension: Yes Has patient had a PCN reaction causing severe rash involving mucus membranes or skin necrosis: No Has patient had a PCN reaction that required hospitalization No Has patient had a PCN reaction occurring within the last 10 years: Yes If all of the above answers are "NO", then may proceed with Cephalosporin use.   Ampicillin    Codeine Itching   Tolerates hydrocodone   Ezetimibe    Livalo [pitavastatin]    Statins Other (See Comments)   Muscle skeletal side effects   Welchol [colesevelam]    Gi se         Medication List        Accurate as of  February 19, 2024 12:47 PM. If you have any questions, ask your nurse or doctor.          allopurinol 100 MG tablet Commonly known as: ZYLOPRIM Take 1 tablet (100 mg total) by mouth daily.   ALPRAZolam 0.25 MG tablet Commonly known as: XANAX Take 1 tablet (0.25 mg total) by mouth daily as needed for anxiety.   Basaglar KwikPen 100 UNIT/ML Inject 46 Units into the skin every morning.   colchicine 0.6 MG tablet 2 tabs at onset of flare, followed by 1 tab 2 hours later. Then continue 1 tab daily until flare is resolved.   cyanocobalamin 1000 MCG/ML injection Commonly known as: VITAMIN B12 Inject 1 mL (1,000 mcg total) into the muscle every 14 (fourteen) days.   dabigatran 150 MG Caps capsule Commonly known as: PRADAXA Take 1 capsule (150 mg total) by mouth 2 (two) times daily.   Dexcom G6 Transmitter Misc USE AS DIRECTED **DX.E11.69   Dexcom G7 Sensor Misc 1 DEVICE BY DOES NOT APPLY ROUTE AS DIRECTED.   EpiPen 2-Pak 0.3 MG/0.3ML Soaj injection Generic drug: EPINEPHrine Inject 0.3 mg into the skin as needed. Bee stings   Insulin Pen Needle 29G X Misc Use to inject insulin   Insulin Pen Needle 31G X 6 MM Misc Use to inject insulin up to 5 times a day   Insulin Pen Needle 29G X 12.7MM Misc Use to inject insulin 5 times daily   levothyroxine 50 MCG tablet Commonly known as: SYNTHROID Take 1 tablet (50 mcg total) by mouth daily before breakfast.   metoprolol tartrate 50 MG tablet Commonly known as: LOPRESSOR Take 1 tablet (50 mg total) by mouth 2 (two) times daily.   NovoLOG FlexPen 100 UNIT/ML FlexPen Generic drug: insulin aspart Max daily 60 units   olmesartan-hydrochlorothiazide 40-25 MG tablet Commonly known as: BENICAR HCT Take 1 tablet by mouth daily.   OneTouch Ultra test strip Generic drug: glucose blood SMARTSIG:Via Meter   PRESCRIPTION MEDICATION Inject 50,000 mcg as directed every 14 (fourteen) days.  Vitamin B12 injections   triamcinolone ointment 0.5 % Commonly known as: KENALOG Apply topically 2 (two) times daily as needed.   Vitamin D (Ergocalciferol) 1.25 MG (50000 UNIT) Caps capsule Commonly known as: DRISDOL 1 CAP ON MONDAY AND THURSDAY WITH FOOD   zolpidem 5 MG tablet Commonly known as: AMBIEN Take 1 tablet (5 mg total) by mouth at bedtime as needed.        All past medical history, surgical history, allergies, family history, immunizations andmedications were updated in the EMR today and reviewed under the history and medication portions of their EMR.     ROS: 14 pt review of systems performed and negative (unless mentioned in an HPI)  Objective: BP 130/80   Pulse 82   Temp 98.2 F (36.8 C)   Wt 250 lb 9.6 oz (113.7 kg)   SpO2 98%   BMI 38.10 kg/m  Physical Exam Vitals and nursing note reviewed.  Constitutional:      General: She is not in acute distress.    Appearance: Normal appearance. She is obese. She is not ill-appearing, toxic-appearing or diaphoretic.  HENT:     Head: Normocephalic and atraumatic.  Eyes:     General: No scleral icterus.       Right eye: No discharge.        Left eye: No discharge.     Extraocular Movements: Extraocular movements intact.     Conjunctiva/sclera: Conjunctivae normal.  Pupils: Pupils are equal, round, and reactive to light.  Cardiovascular:     Rate and Rhythm: Normal rate. Rhythm irregular.     Heart sounds: No murmur heard. Pulmonary:     Effort: Pulmonary effort is normal. No respiratory distress.     Breath sounds: Normal breath sounds. No wheezing, rhonchi or rales.  Musculoskeletal:     Right lower leg: No edema.     Left lower leg: No edema.  Skin:    General: Skin is warm.     Coloration: Skin is not pale.     Findings: No rash.  Neurological:     Mental Status: She is alert and oriented to person, place, and time. Mental status is at baseline.     Motor: No weakness.     Gait: Gait normal.   Psychiatric:        Mood and Affect: Mood normal.        Behavior: Behavior normal.        Thought Content: Thought content normal.        Judgment: Judgment normal.     Assessment/plan: Ann [name] is a 73 y.o. female present for Chronic Conditions/illness Management Anxiety/sleep disturbance Stable Continue Xanax 0.25 mg daily daily as needed.  She rarely uses this medicine. She uses medications appropriately and understands risks of drug class. Continue  Ambien at the 5 mg dose for her.  She understands 5 mg is the maximum recommended dose for those in her age bracket.  If for some reason her Medicare plan will not cover this we can consider GoodRx prescription since generic Ambien is rather inexpensive. Patient understands State regulations on controlled substances and [facility] policy on prescribing controlled substances.  Contract UTD [place] controlled substance database reviewed today 02/19/24 Patient aware a face-to-face follow-up will be needed if she would need refills after 6 months.  Patient aware she will need to request these scripts they will not be automatically refilled since they are controlled substances.  Hypertension/morbid obesity/A-fib/h/o CVA:  Stable Continue  Pradaxa twice daily> cardiology prescribes Continue  metoprolol 50 mg twice daily Continue valsartan-HCTZ 40-25 daily Heart healthy diet recommended Statin declined She is established with cardio.   DM w/ hyperlipidemia:  Diabetes managed by endocrine. Last A1c 7.7>endocrine Cholesterol has been elevated.  Heart healthy diet and routine exercise encouraged.  Medication management has been declined by patient for her lipids.  Diabetic eye exam up-to-date 05/30/2023 Foot exam UTD 07/2023 Pneumonia vac up-to-date Microalbumin UTD 09/2023  Gout  Continue allopurinol 100 mg daily - Continue colchicine as needed - Patient was provided with low purine diet  B12 deficiency/Thiamine  deficiency/Vitamin D deficiency/expressive aphasia - Vitamin B1> severely deficient <6 >supplementing> re-collected today - Vitamin D (25 hydroxy)>supplementing > collected today - Vitamin B12> normal with supplement( b12 IM q 2 weeks)-patient needs to discontinue IM and try B12 1000 mcg sublingual daily.  Will retest B12 next visit to ensure this is maintaining levels for her.   Hypothyroidism due to acquired atrophy of thyroid Stable Continue levothyroxine 50 mcg daily.   Labs  UTD 06/2023  Aortic atherosclerosis (HCC)/Coronary artery calcification of native artery/Basal ganglia stroke (HCC)/Subclavian artery stenosis (HCC)/Vertebral artery occlusion, left/Left carotid artery stenosis/ Internal carotid artery stenosis, bilateral-25% right and 50% left proximal ICA stenosis/Word finding difficulty/Expressive aphasia/Cerebrovascular accident (CVA) due to stenosis of cerebral artery (HCC)/ Continue to follow with cardiology and neurology for management   Return in about 24 weeks (around 08/05/2024) for Routine chronic condition  follow-up.  Orders Placed This Encounter  Procedures   Vitamin D (25 hydroxy)   Comp Met (CMET)   CBC   Vitamin B1   Uric acid    Meds ordered this encounter  Medications   allopurinol (ZYLOPRIM) 100 MG tablet    Sig: Take 1 tablet (100 mg total) by mouth daily.    Dispense:  90 tablet    Refill:  1   ALPRAZolam (XANAX) 0.25 MG tablet    Sig: Take 1 tablet (0.25 mg total) by mouth daily as needed for anxiety.    Dispense:  30 tablet    Refill:  5   metoprolol tartrate (LOPRESSOR) 50 MG tablet    Sig: Take 1 tablet (50 mg total) by mouth 2 (two) times daily.    Dispense:  180 tablet    Refill:  1   olmesartan-hydrochlorothiazide (BENICAR HCT) 40-25 MG tablet    Sig: Take 1 tablet by mouth daily.    Dispense:  90 tablet    Refill:  1   Vitamin D, Ergocalciferol, (DRISDOL) 1.25 MG (50000 UNIT) CAPS capsule    Sig: 1 CAP ON MONDAY AND THURSDAY WITH FOOD     Dispense:  24 capsule    Refill:  3   zolpidem (AMBIEN) 5 MG tablet    Sig: Take 1 tablet (5 mg total) by mouth at bedtime as needed.    Dispense:  30 tablet    Refill:  5   cyanocobalamin (VITAMIN B12) injection 1,000 mcg   levothyroxine (SYNTHROID) 50 MCG tablet    Sig: Take 1 tablet (50 mcg total) by mouth daily before breakfast.    Dispense:  90 tablet    Refill:  3    Referral Orders  No referral(s) requested today      Note is dictated utilizing voice recognition software. Although note has been proof read prior to signing, occasional typographical errors still can be missed. If any questions arise, please do not hesitate to call for verification.  Electronically signed by: Felix Pacini, DO [facility] [place]

## 2024-02-19 NOTE — Patient Instructions (Addendum)

## 2024-02-21 ENCOUNTER — Ambulatory Visit (INDEPENDENT_AMBULATORY_CARE_PROVIDER_SITE_OTHER): Payer: Medicare Other | Admitting: Physician Assistant

## 2024-02-21 ENCOUNTER — Encounter: Payer: Self-pay | Admitting: Physician Assistant

## 2024-02-21 VITALS — BP 130/80 | HR 76 | Resp 20 | Ht 68.0 in | Wt 252.0 lb

## 2024-02-21 DIAGNOSIS — I6350 Cerebral infarction due to unspecified occlusion or stenosis of unspecified cerebral artery: Secondary | ICD-10-CM

## 2024-02-21 DIAGNOSIS — R4701 Aphasia: Secondary | ICD-10-CM | POA: Diagnosis not present

## 2024-02-21 DIAGNOSIS — R413 Other amnesia: Secondary | ICD-10-CM

## 2024-02-21 NOTE — Patient Instructions (Signed)
 Continue Pradaxa 2 times a day Follow up with Cardiology, recommend secondary stroke prevention less than 130/90    Increase activity Recommend mediterranean diet To help prevent Alzheimer's dementia: 1.  Proper sleep (8 hours a night) 2.  Routine Exercise (at least 10,000 steps per day) 3.  Socialize.  Interact with others.  Meet friends for lunch.  Visit family. 4.  Learn new things:  Read a book about a topic that interests you, watch a documentary, read the newspaper, talk to friends about different topics.   Mediterranean Diet A Mediterranean diet refers to food and lifestyle choices that are based on the traditions of countries located on the [employer]. This way of eating has been shown to help prevent certain conditions and improve outcomes for people who have chronic diseases, like kidney disease and heart disease. What are tips for following this plan? Lifestyle  Cook and eat meals together with your family, when possible. Drink enough fluid to keep your urine clear or pale yellow. Be physically active every day. This includes: Aerobic exercise like running or swimming. Leisure activities like gardening, walking, or housework. Get 7-8 hours of sleep each night. If recommended by your health care provider, drink red wine in moderation. This means 1 glass a day for nonpregnant women and 2 glasses a day for men. A glass of wine equals 5 oz (150 mL). Reading food labels  Check the serving size of packaged foods. For foods such as rice and pasta, the serving size refers to the amount of cooked product, not dry. Check the total fat in packaged foods. Avoid foods that have saturated fat or trans fats. Check the ingredients list for added sugars, such as corn syrup. Shopping  At the grocery store, buy most of your food from the areas near the walls of the store. This includes: Fresh fruits and vegetables (produce). Grains, beans, nuts, and seeds. Some of these may be available  in unpackaged forms or large amounts (in bulk). Fresh seafood. Poultry and eggs. Low-fat dairy products. Buy whole ingredients instead of prepackaged foods. Buy fresh fruits and vegetables in-season from local farmers markets. Buy frozen fruits and vegetables in resealable bags. If you do not have access to quality fresh seafood, buy precooked frozen shrimp or canned fish, such as tuna, salmon, or sardines. Buy small amounts of raw or cooked vegetables, salads, or olives from the deli or salad bar at your store. Stock your pantry so you always have certain foods on hand, such as olive oil, canned tuna, canned tomatoes, rice, pasta, and beans. Cooking  Cook foods with extra-virgin olive oil instead of using butter or other vegetable oils. Have meat as a side dish, and have vegetables or grains as your main dish. This means having meat in small portions or adding small amounts of meat to foods like pasta or stew. Use beans or vegetables instead of meat in common dishes like chili or lasagna. Experiment with different cooking methods. Try roasting or broiling vegetables instead of steaming or sauteing them. Add frozen vegetables to soups, stews, pasta, or rice. Add nuts or seeds for added healthy fat at each meal. You can add these to yogurt, salads, or vegetable dishes. Marinate fish or vegetables using olive oil, lemon juice, garlic, and fresh herbs. Meal planning  Plan to eat 1 vegetarian meal one day each week. Try to work up to 2 vegetarian meals, if possible. Eat seafood 2 or more times a week. Have healthy snacks readily available, such  as: Vegetable sticks with hummus. Greek yogurt. Fruit and nut trail mix. Eat balanced meals throughout the week. This includes: Fruit: 2-3 servings a day Vegetables: 4-5 servings a day Low-fat dairy: 2 servings a day Fish, poultry, or lean meat: 1 serving a day Beans and legumes: 2 or more servings a week Nuts and seeds: 1-2 servings a day Whole  grains: 6-8 servings a day Extra-virgin olive oil: 3-4 servings a day Limit red meat and sweets to only a few servings a month What are my food choices? Mediterranean diet Recommended Grains: Whole-grain pasta. Brown rice. Bulgar wheat. Polenta. Couscous. Whole-wheat bread. Orpah Cobb. Vegetables: Artichokes. Beets. Broccoli. Cabbage. Carrots. Eggplant. Green beans. Chard. Kale. Spinach. Onions. Leeks. Peas. Squash. Tomatoes. Peppers. Radishes. Fruits: Apples. Apricots. Avocado. Berries. Bananas. Cherries. Dates. Figs. Grapes. Lemons. Melon. Oranges. Peaches. Plums. Pomegranate. Meats and other protein foods: Beans. Almonds. Sunflower seeds. Pine nuts. Peanuts. Cod. Salmon. Scallops. Shrimp. Tuna. Tilapia. Clams. Oysters. Eggs. Dairy: Low-fat milk. Cheese. Greek yogurt. Beverages: Water. Red wine. Herbal tea. Fats and oils: Extra virgin olive oil. Avocado oil. Grape seed oil. Sweets and desserts: [place] yogurt with honey. Baked apples. Poached pears. Trail mix. Seasoning and other foods: Basil. Cilantro. Coriander. Cumin. Mint. Parsley. Sage. Rosemary. Tarragon. Garlic. Oregano. Thyme. Pepper. Balsalmic vinegar. Tahini. Hummus. Tomato sauce. Olives. Mushrooms. Limit these Grains: Prepackaged pasta or rice dishes. Prepackaged cereal with added sugar. Vegetables: Deep fried potatoes (french fries). Fruits: Fruit canned in syrup. Meats and other protein foods: Beef. Pork. Lamb. Poultry with skin. Hot dogs. Tomasa Blase. Dairy: Ice cream. Sour cream. Whole milk. Beverages: Juice. Sugar-sweetened soft drinks. Beer. Liquor and spirits. Fats and oils: Butter. Canola oil. Vegetable oil. Beef fat (tallow). Lard. Sweets and desserts: Cookies. Cakes. Pies. Candy. Seasoning and other foods: Mayonnaise. Premade sauces and marinades. The items listed may not be a complete list. Talk with your dietitian about what dietary choices are right for you. Summary The Mediterranean diet includes both food and  lifestyle choices. Eat a variety of fresh fruits and vegetables, beans, nuts, seeds, and whole grains. Limit the amount of red meat and sweets that you eat. Talk with your health care provider about whether it is safe for you to drink red wine in moderation. This means 1 glass a day for nonpregnant women and 2 glasses a day for men. A glass of wine equals 5 oz (150 mL). This information is not intended to replace advice given to you by your health care provider. Make sure you discuss any questions you have with your health care provider. Document Released: 07/20/2016 Document Revised: 08/22/2016 Document Reviewed: 07/20/2016 Elsevier Interactive Patient Education  2017 [employer].

## 2024-02-21 NOTE — Progress Notes (Signed)
 Assessment/Plan:   Memory difficulties, resolved  Ann [name] is a very pleasant 73 y.o. RH female with a history of hypertension, hyperlipidemia, A-fib noncompliant with anticoagulation, history of colon cancer in 2001, hypothyroidism, B1 and B12 deficiency, vitamin D deficiency, DM2,  history of melanoma status post wide excision near scapula, OSA unable to tolerate CPAP, expressive aphasia following a recent basal ganglia subacute stroke on 08/03/2023, permanent atrial fibrillation not compliant with the second dose of Pradaxa until recently presenting today in follow-up for evaluation of memory difficulties.  Patient not on antidementia medication, not indicated.  She is able to participate in her ADLs, and drives without any difficulties      Recommendations:   Follow up as needed No indication for antidementia medication Monitor for her use of Ambien and by PCP and this can interfere with her memory. Recommend good control of cardiovascular risk factors, follow with cardiology Continue to control mood as per PCP  Recent basal ganglia subacute stroke Expressive aphasia,resolved   Patient presented with expressive aphasia, and MRI of the brain without contrast was remarkable for subacute stroke.  CT angio of the head and neck showed moderate diffuse atherosclerotic changes in the bilateral ICA (25 to 50% stenosis) and severe stenosis and near occlusion of the left vertebral artery, also left PCA P1 and P2 junction stenosis.  She also was found to have severe stenosis of the left subclavian artery.  The stroke felt to be embolic due to patient not being compliant with Pradaxa twice daily, she was only using it daily, which was not the way it was prescribed by cardiology.  Patient has improved, no further aphasia, she finished ST one month ago.  She denies any other strokelike symptoms.      Recommend adherence to anticoagulation with Pradaxa twice daily Continue Repatha as per  cardiology Monitor other cardiovascular risk factors, including blood pressure and diabetes.  Follow-up with cardiology. Continue secondary stroke prevention  Left subclavian artery stenosis and occlusion noted on CT angio of 07/18/2023, asymptomatic for dizziness or syncope or claudication, to continue on medical therapy, without indication for invasive strategy as per cardiology recommendations. Increase activity as tolerated.   Continue B1 and B12, vitamin D replenishment [place] up in  6 months   Subjective:   This patient is here alone. Previous records as well as any outside records available were reviewed prior to todays visit.   Patient was last seen on 08/24/2023 with MoCA of 28/30.     Any changes in memory since last visit?  "Doing well".  She denies any memory difficulties. Aphasia has resolved, she finished her  ST sessions successfully.     repeats oneself?  Endorsed Disoriented when walking into a room?  Patient denies    Misplacing objects?  Patient denies   Wandering behavior?   denies   Any personality changes since last visit?   denies   Any worsening depression?: denies   Hallucinations or paranoia?  denies   Seizures?   denies    Any sleep changes? Sleeps well if taking Ambien 5 mg nightly. Denies vivid dreams, REM behavior or sleepwalking   Sleep apnea?   denies    Any hygiene concerns?   denies   Independent of bathing and dressing?  Endorsed  Does the patient needs help with medications? Patient is in charge   Who is in charge of the finances?  Patient is in charge     Any changes in appetite?  denies  Patient have trouble swallowing?  denies   Does the patient cook?  Any kitchen accidents such as leaving the stove on?   denies   Any headaches?    denies   Vision changes? denies Chronic pain?  denies   Ambulates with difficulty?  She has issues with left knee arthritis, did PT in the past.  Does chair excercises .She uses a cane for stability   Recent falls  or head injuries?    denies      Unilateral weakness, numbness or tingling?   denies   Any tremors?  denies   Any anosmia?    denies   Any incontinence of urine?  denies   Any bowel dysfunction?  She has a history of colon cancer, sometimes she has issues with loose stools. Patient lives alone  Does the patient drive? Yes, denies any issues.   Initial visit for memory 08/24/2023  How long did patient have memory difficulties?  Patient denies any memory difficulties.  She reports inability to express the words.  "I knew it was a stroke but waited 2 weeks before going to the hospital to be seen. I do not have any memory issues.  ".I can't tell you what words, it can be a simple word, basic word or complicated word .  repeats oneself?  Denies  Disoriented when walking into a room?  Patient denies   Leaving objects in unusual places?  denies   Wandering behavior? denies   Any personality changes ? Denies. Any history of depression?: denies. Hallucinations or paranoia? Denies.   Seizures? Denies.    Any sleep changes?  Sleeps well .Denies vivid dreams, REM behavior or sleepwalking   Sleep apnea? denies   Any hygiene concerns?  Denies.   Independent of bathing and dressing? Endorsed  Does the patient need help with medications? Patient is in charge.   Who is in charge of the finances?  Patient is in charge     Any changes in appetite?   Decreased.       Patient have trouble swallowing?  Denies.   Does the patient cook? Yes, denies any issues Any headaches?  Denies.   Chronic pain? Denies.   Ambulates with difficulty? Endorsed, due to L knee arthritis. Sometimes she uses the cane for stability. Did PT until the stroke.   Recent falls or head injuries? denies     Vision changes? Denies  Unilateral weakness, numbness or tingling? Denies.  Denies any history of TIA. Denies vertigo dizziness or vision changes. Denies headaches, dysarthria or dysphagia. No confusion or seizures. Denies any chest  pain, or shortness of breath. Denies any fever or chills, or night sweats. No tobacco. No new meds or hormonal supplements. Denies any recent long distance trips . No sick contacts. No new stressors present in personal life. Patient was not very active  Strong family history of stroke   Any tremors? denies   Any anosmia? Denies.   Any incontinence of urine? Denies.   Any bowel dysfunction?  She had recent diarrhea. She has a history of AdCa Colon at [facility] in 2001, s/p resection and chemo     Patient lives alone   History of heavy alcohol intake? Denies.   History of heavy tobacco use? Denies.   Family history of dementia? Denies  Does patient drive? No   I retired Garment/textile technologist. Retired at age 20   MRI brain 07/18/2023, personally reviewed: Chronic small vessel ischemic changes, mild for age Mild L>R cerebral  atrophy Acute to early subacute infarct in the left basal ganglia with petechial hemorrhage but no mass effect or midline shift.   CT angio head and neck 07/18/2023:  1. Aortic atherosclerosis. 2. Atherosclerotic disease at both carotid bifurcations. 25% stenosis of the proximal ICA on the right and 50% stenosis of the proximal ICA on the left. 3. 50% stenosis of the right vertebral artery origin. Severe stenosis of the proximal left subclavian artery. Occlusion of the proximal left vertebral artery with reconstituted flow in the upper cervical region due to cervical collaterals. 30% stenosis of the left vertebral artery at the foramen magnum. Severe focal stenosis of the left PCA at the P1 P2 junction. 4. Atherosclerotic disease in both carotid siphon regions with stenosis estimated at 30-50% on both sides.   Lexiscan Tetrofosmin stress test 05/25/2021: Lexiscan nuclear stress test performed using 1-day protocol. SPECT images show small sized, mild intensity, reversible perfusion defect in mid to basal inferolateral myocardium. Stress LVEF 73%. Low risk study.   2 D Echo Left  ventricle cavity is normal in size. Mild concentric hypertrophy of the left ventricle. Normal LV systolic function with visual EF 55-60%. Normal global wall motion. Unable to evaluate diastolic function due to atrial fibrillation. Left atrial cavity is moderately dilated. Mild tricuspid regurgitation. Estimated pulmonary artery systolic pressure 32 mmHg. No significant change compared to previous study in 2019.   Carotid artery duplex 06/01/2021: Duplex suggests stenosis in the right internal carotid artery (<50%).  Duplex suggests stenosis in the right common carotid artery (<50%), probably minimal heterogenous plaque. Duplex suggests stenosis in the left internal carotid artery (<50%), probably minimal heterogenous plaque. Duplex suggests stenosis in the left external carotid artery (<50%). Antegrade right vertebral artery flow. Antegrade left vertebral artery flow. Follow up is appropriate if clinically indicated.    EKG 08/10/2023: Atrial fibrillation with controlled ventricular sponsor at the rate of 64 beats minute, normal axis.  No evidence of ischemia.  Compared to 01/31/2023, no significant change.   Past Medical History:  Diagnosis Date   Arthritis    osteoarthritis- knees.   Atrial fibrillation (HCC)    Dr. Maisie Fus status post cardioversion chronic anticoagulation   Carotid artery occlusion    Cellulitis of right lower extremity 12/30/2020   Chicken pox    Colon cancer (HCC) 2001   Radiation, chemo.  Adenocarcinoma of the colon.  Followed by Dr. Kennith Center   Complication of anesthesia    sensitive to narcotics"extra sleepy". Versed- "no memory recall for 4 days postop"   Diabetes mellitus without complication (HCC)    oral and Insulin   Diarrhea 07/09/2023   Hepatitis    "sub clinical case of Hepatitis in late 70's""has immunities"   Hypertension    Hypothyroidism    Laceration of head 08/04/2019   8 cm laceration forehead/scalp after tripping when mowing her lawn.   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   Ovarian teratoma, right 09/2000   Oophorectomy   Sleep apnea    unable to tolreate cpap   Wisdom teeth extracted    Word finding difficulty 07/09/2023     Past Surgical History:  Procedure Laterality Date   ABDOMINAL HYSTERECTOMY  09/2000    Total hysterectomy and oophorectomy   ABDOMINAL WOUND DEHISCENCE Bilateral    multiple times / mesh use with multiple abdominal hernia repair   CARDIOVERSION  2014   Unsuccessful.  Patient reports cardiac ablation was not felt to be appropriate next step by cardiology.   CHOLECYSTECTOMY  04/1998   '98 -Lapraroscopic   COLON RESECTION  2001   "colon cancer"   FOOT SURGERY Bilateral    multiple "hammer toes and bunionectomies"   KNEE ARTHROSCOPY Left 08/2006   meniscectomy   MELANOMA EXCISION     near scapula   TOTAL KNEE ARTHROPLASTY Right 01/24/2016   Procedure: RIGHT TOTAL KNEE ARTHROPLASTY;  Surgeon: Ollen Gross, MD;  Location: [facility];  Service: Orthopedics;  Laterality: Right;     PREVIOUS MEDICATIONS:   CURRENT MEDICATIONS:  Outpatient Encounter Medications as of 02/21/2024  Medication Sig   allopurinol (ZYLOPRIM) 100 MG tablet Take 1 tablet (100 mg total) by mouth daily.   ALPRAZolam (XANAX) 0.25 MG tablet Take 1 tablet (0.25 mg total) by mouth daily as needed for anxiety.   colchicine 0.6 MG tablet 2 tabs at onset of flare, followed by 1 tab 2 hours later. Then continue 1 tab daily until flare is resolved.   Continuous Glucose Sensor (DEXCOM G7 SENSOR) MISC 1 DEVICE BY DOES NOT APPLY ROUTE AS DIRECTED.   Continuous Glucose Transmitter (DEXCOM G6 TRANSMITTER) MISC USE AS DIRECTED **DX.E11.69   cyanocobalamin (VITAMIN B12) 1000 MCG/ML injection Inject 1 mL (1,000 mcg total) into the muscle every 14 (fourteen) days.   dabigatran (PRADAXA) 150 MG CAPS capsule Take 1 capsule (150 mg total) by  mouth 2 (two) times daily.   EPIPEN 2-PAK 0.3 MG/0.3ML SOAJ injection Inject 0.3 mg into the skin as needed. Bee stings   insulin aspart (NOVOLOG FLEXPEN) 100 UNIT/ML FlexPen Max daily 60 units   Insulin Glargine (BASAGLAR KWIKPEN) 100 UNIT/ML Inject 46 Units into the skin every morning.   Insulin Pen Needle (BD PEN NEEDLE MICRO U/F) 32G X 6 MM MISC Use to inject insulin 5 times daily.   Insulin Pen Needle 29G X 12.7MM MISC Use to inject insulin 5 times daily   Insulin Pen Needle 29G X MISC Use to inject insulin   Insulin Pen Needle 31G X 6 MM MISC Use to inject insulin up to 5 times a day   levothyroxine (SYNTHROID) 50 MCG tablet Take 1 tablet (50 mcg total) by mouth daily before breakfast.   metoprolol tartrate (LOPRESSOR) 50 MG tablet Take 1 tablet (50 mg total) by mouth 2 (two) times daily.   olmesartan-hydrochlorothiazide (BENICAR HCT) 40-25 MG tablet Take 1 tablet by mouth daily.   ONETOUCH ULTRA test strip SMARTSIG:Via Meter   PRESCRIPTION MEDICATION Inject 50,000 mcg as directed every 14 (fourteen) days. Vitamin B12 injections   triamcinolone ointment (KENALOG) 0.5 % Apply topically 2 (two) times daily as needed.   Vitamin D, Ergocalciferol, (DRISDOL) 1.25 MG (50000 UNIT) CAPS capsule 1 CAP ON MONDAY AND THURSDAY WITH FOOD   zolpidem (AMBIEN) 5 MG tablet Take 1 tablet (5 mg total) by mouth at bedtime as needed.   No facility-administered encounter medications on file as of 02/21/2024.     Objective:     PHYSICAL EXAMINATION:    VITALS:   Vitals:   02/21/24 1103  BP: 130/80  Pulse: 76  Resp: 20  SpO2: 98%  Weight: 252 lb (114.3 kg)  Height: 5\' 8"  (1.727 m)    GEN:  The patient appears stated age and is in NAD. HEENT:  Normocephalic, atraumatic.   Neurological examination:  General: NAD, well-groomed, appears stated age. Orientation: The patient is alert. Oriented to person, place and  date Cranial nerves: There is good facial symmetry.The speech is fluent and  clear.No aphasia, no dysarthria. Fund of knowledge is appropriate. Recent  and remote memory is normal.  Attention and concentration are normal.  Able to name objects and repeat phrases.  Hearing is intact to conversational tone   Sensation: Sensation is intact to light touch throughout Motor: Strength is at least antigravity x4. DTR's 1/4 in UE/LE      08/24/2023    9:00 AM  Montreal Cognitive Assessment   Visuospatial/ Executive (0/5) 5  Naming (0/3) 3  Attention: Read list of digits (0/2) 2  Attention: Read list of letters (0/1) 1  Attention: Serial 7 subtraction starting at 100 (0/3) 3  Language: Repeat phrase (0/2) 2  Language : Fluency (0/1) 0  Abstraction (0/2) 2  Delayed Recall (0/5) 4  Orientation (0/6) 6  Total 28  Adjusted Score (based on education) 28        No data to display             Movement examination: Tone: There is normal tone in the UE/LE Abnormal movements:  no tremor.  No myoclonus.  No asterixis.   Coordination:  There is no decremation with RAM's. Normal finger to nose  Gait and Station: The patient has no difficulty arising out of a deep-seated chair without the use of the hands due to chronic arthritis. The patient's stride length is good.  Gait is cautious and narrow.   Thank you for allowing [place] the opportunity to participate in the care of this nice patient. Please do not hesitate to contact us for any questions or concerns.   Total time spent on today's visit was 28 minutes dedicated to this patient today, preparing to see patient, examining the patient, ordering tests and/or medications and counseling the patient, documenting clinical information in the EHR or other health record, independently interpreting results and communicating results to the patient/family, discussing treatment and goals, answering patient's questions and coordinating care.  Cc:  Hardie Shackleton [facility] 02/21/2024 12:27 PM

## 2024-02-22 ENCOUNTER — Ambulatory Visit (INDEPENDENT_AMBULATORY_CARE_PROVIDER_SITE_OTHER)

## 2024-02-22 VITALS — BP 130/80 | HR 70 | Temp 97.9°F | Resp 16 | Ht 68.0 in | Wt 249.4 lb

## 2024-02-22 DIAGNOSIS — I700 Atherosclerosis of aorta: Secondary | ICD-10-CM

## 2024-02-22 DIAGNOSIS — I6350 Cerebral infarction due to unspecified occlusion or stenosis of unspecified cerebral artery: Secondary | ICD-10-CM

## 2024-02-22 LAB — VITAMIN B1: Vitamin B1 (Thiamine): 8 nmol/L (ref 8–30)

## 2024-02-22 MED ORDER — INCLISIRAN SODIUM 284 MG/1.5ML ~~LOC~~ SOSY
284.0000 mg | PREFILLED_SYRINGE | Freq: Once | SUBCUTANEOUS | Status: AC
  Administered 2024-02-22: 284 mg via SUBCUTANEOUS
  Filled 2024-02-22: qty 1.5

## 2024-02-22 NOTE — Patient Instructions (Signed)
 Inclisiran Injection What is this medication? INCLISIRAN (in kli SIR an) treats high cholesterol. It works by decreasing bad cholesterol (such as LDL) in your blood. Changes to diet and exercise are often combined with this medication. This medicine may be used for other purposes; ask your health care provider or pharmacist if you have questions. COMMON BRAND NAME(S): LEQVIO What should I tell my care team before I take this medication? They need to know if you have any of these conditions: An unusual or allergic reaction to inclisiran, other medications, foods, dyes, or preservatives Pregnant or trying to get pregnant Breast-feeding How should I use this medication? This medication is injected under the skin. It is given by your care team in a hospital or clinic setting. Talk to your care team about the use of this medication in children. Special care may be needed. Overdosage: If you think you have taken too much of this medicine contact a poison control center or emergency room at once. NOTE: This medicine is only for you. Do not share this medicine with others. What if I miss a dose? Keep appointments for follow-up doses. It is important not to miss your dose. Call your care team if you are unable to keep an appointment. What may interact with this medication? Interactions are not expected. This list may not describe all possible interactions. Give your health care provider a list of all the medicines, herbs, non-prescription drugs, or dietary supplements you use. Also tell them if you smoke, drink alcohol, or use illegal drugs. Some items may interact with your medicine. What should I watch for while using this medication? Visit your care team for regular checks on your progress. Tell your care team if your symptoms do not start to get better or if they get worse. You may need blood work while you are taking this medication. What side effects may I notice from receiving this  medication? Side effects that you should report to your care team as soon as possible: Allergic reactions--skin rash, itching, hives, swelling of the face, lips, tongue, or throat Side effects that usually do not require medical attention (report these to your care team if they continue or are bothersome): Joint pain Pain, redness, or irritation at injection site This list may not describe all possible side effects. Call your doctor for medical advice about side effects. You may report side effects to FDA at 1-800-FDA-1088. Where should I keep my medication? This medication is given in a hospital or clinic. It will not be stored at home. NOTE: This sheet is a summary. It may not cover all possible information. If you have questions about this medicine, talk to your doctor, pharmacist, or health care provider.  2024 Elsevier/Gold Standard (2023-11-09 00:00:00)

## 2024-02-22 NOTE — Progress Notes (Signed)
 Diagnosis: Hyperlipidemia  Provider:  Chilton Greathouse MD  Procedure: Injection  Leqvio (inclisiran), Dose: 284 mg, Site: subcutaneous, Number of injections: 1  Injection Site(s): Left arm  Post Care: Observation period completed and Peripheral IV Discontinued  Discharge: Condition: Good, Destination: Home . AVS Provided  Performed by:  Rico Ala, LPN

## 2024-02-25 ENCOUNTER — Other Ambulatory Visit: Payer: Self-pay | Admitting: Family Medicine

## 2024-02-25 NOTE — Telephone Encounter (Signed)
 Copied from CRM [id number]. Topic: Clinical - Medication Refill >> Feb 25, 2024  2:21 PM Elizebeth Brooking wrote: Most Recent Primary Care Visit:  Provider: Felix Pacini A  Department: [facility]  Visit Type: OFFICE VISIT  Date: 02/19/2024  Medication: Needles BD 30   Has the patient contacted their pharmacy? Yes (Agent: If no, request that the patient contact the pharmacy for the refill. If patient does not wish to contact the pharmacy document the reason why and proceed with request.) (Agent: If yes, when and what did the pharmacy advise?)  Is this the correct pharmacy for this prescription? Yes If no, delete pharmacy and type the correct one.  This is the patient's preferred pharmacy:  [facility] [place], [place] - [address] [place] [address] [place] [address] [place] [address] Phone: [phone] Fax: [phone]     Has the prescription been filled recently? No  Is the patient out of the medication? Yes  Has the patient been seen for an appointment in the last year OR does the patient have an upcoming appointment? Yes  Can we respond through MyChart? Yes  Agent: Please be advised that Rx refills may take up to 3 business days. We ask that you follow-up with your pharmacy.

## 2024-03-19 ENCOUNTER — Other Ambulatory Visit: Payer: Self-pay | Admitting: Family Medicine

## 2024-03-19 NOTE — Telephone Encounter (Signed)
 Summary: Take 1 tablet (5 mg total) by mouth at bedtime as needed., Starting Tue 02/19/2024, Normal Dose, Route, Frequency: 5 mg, Oral, At bedtime PRNStart: 03/11/2025Ord/Sold: 02/19/2024 (O)Ordered On: 03/11/2025Pharmacy: [id number] [place], [place] - [address]ReportDx Associated: Taking: Long-term: Med Note:      Ambien prescription was ordered 02/19/2024- total of 6 months (see above)  Please have patient call her pharmacy and request the Ambien to be filled.  Whenever there is a new prescription for Ambien sent, or any controlled substances, the pharmacy frequently will switch to the refill/prescription number attached.  Therefore the patient cannot call in for refills using the particular refill number.  They have to call and request the medication by name.

## 2024-03-20 ENCOUNTER — Telehealth: Payer: Self-pay

## 2024-03-20 NOTE — Telephone Encounter (Signed)
 Reason for CRM: Patient called regarding medication refill and was informed on previous message left in chart regarding zolpidem (AMBIEN) 5 MG tablet prescription. Patient stated she would like to simply remind the provider to refill the scripts despite information told to her. She said she contact the pharmacy and was told to contact office for further assistance. Patient states she will call back in a couple days to follow up.                Pt called by vanessa and advised to call her pharmacy.

## 2024-05-02 ENCOUNTER — Ambulatory Visit (INDEPENDENT_AMBULATORY_CARE_PROVIDER_SITE_OTHER): Payer: Medicare Other | Admitting: Internal Medicine

## 2024-05-02 ENCOUNTER — Encounter: Payer: Self-pay | Admitting: Internal Medicine

## 2024-05-02 VITALS — BP 118/76 | HR 94 | Ht 68.0 in | Wt 242.0 lb

## 2024-05-02 DIAGNOSIS — E1165 Type 2 diabetes mellitus with hyperglycemia: Secondary | ICD-10-CM

## 2024-05-02 DIAGNOSIS — Z794 Long term (current) use of insulin: Secondary | ICD-10-CM | POA: Diagnosis not present

## 2024-05-02 LAB — POCT GLYCOSYLATED HEMOGLOBIN (HGB A1C): Hemoglobin A1C: 7 % — AB (ref 4.0–5.6)

## 2024-05-02 MED ORDER — TIRZEPATIDE 5 MG/0.5ML ~~LOC~~ SOAJ: 5.0000 mg | SUBCUTANEOUS | 3 refills | Status: DC

## 2024-05-02 NOTE — Patient Instructions (Addendum)
-   Basaglar  46 units daily  - Novolog  10 units with each meal  - Novolog  correctional insulin : ADD extra units on insulin  to your meal-time Novolog  dose if your blood sugars are higher than 145. Use the scale below to help guide you:   Blood sugar before meal Number of units to inject  145 0 unit  146 - 160 1 units  161 - 175 2 units  176 - 190 3 units  191 - 205 4 units  206 - 220 5 units  221 - 235 6 units  236 - 250 7 units  251 - 265 8 units  266 - 280 9 units   281- 295 10 units      HOW TO TREAT LOW BLOOD SUGARS (Blood sugar LESS THAN 70 MG/DL) Please follow the RULE OF 15 for the treatment of hypoglycemia treatment (when your (blood sugars are less than 70 mg/dL)   STEP 1: Take 15 grams of carbohydrates when your blood sugar is low, which includes:  3-4 GLUCOSE TABS  OR 3-4 OZ OF JUICE OR REGULAR SODA OR ONE TUBE OF GLUCOSE GEL    STEP 2: RECHECK blood sugar in 15 MINUTES STEP 3: If your blood sugar is still low at the 15 minute recheck --> then, go back to STEP 1 and treat AGAIN with another 15 grams of carbohydrates.

## 2024-05-02 NOTE — Progress Notes (Signed)
 Name: Ann [name]  Age/ Sex: 73 y.o., female   MRN/ DOB: [id number], 1951/10/02     PCP: Mariel Shope, DO   Reason for Endocrinology Evaluation: Type 2 Diabetes Mellitus  Initial Endocrine Consultative Visit: 09/22/2020    PATIENT IDENTIFIER: Ann [name] is a 73 y.o. female with a past medical history of T2Dm, A. Fib ,HTn and dyslipidemia, Has hx of colon cancer  . The patient has followed with Endocrinology clinic since 2021 for consultative assistance with management of her diabetes.  DIABETIC HISTORY:  Ann [name] was diagnosed with DM in 2013. Intolerant to Metformin .Farxiga and invokana caused genital irritation.Trulicity  -caused urinary retention . Her hemoglobin A1c has ranged from 7.6% in 2022, peaking at 8.8% in 2022.    She had a normal thyroid  ultrasound in May 2023, which was prompted by thyromegaly on physical exam.  She was followed up by Dr. Washington Hacker from 2021 until March 2023   Started Ozempic  07/2023 with an A1c of 9.5%   SUBJECTIVE:   During the last visit (01/02/2024): Ann [name] A1c 7.7%     Today (05/02/2024): Ann [name] is here for a follow up on diabetes management .She checks her blood sugars multiple times a day through CGM. The patient has had one episode of hypoglycemic episodes since the last clinic visit, she is symptomatic with these episodes  Patient continues to follow-up with neurology for expressive aphasia following basal ganglia and subacute stroke 07/2023 She continues to follow-up with cardiology for carotid stenosis, dyslipidemia and A-fib  She was seen by GI for changes in bowel movements, she has once a day moevemts but she doesn't  Had one episode of nausea at christmas but no recent N/V  She did hold the Ozempic  to see if this would impact    HOME DIABETES REGIMEN:  Ozempic  0.5 mg weekly Basaglar  46 units daily  Novolog  10 units TIDQAC CF : Novolog  (BG-130/15) TIDQAC     Statin: yes ACE-I/ARB: yes    CONTINUOUS  GLUCOSE MONITORING RECORD INTERPRETATION    Dates of Recording: 5/10-5/23/2025  Sensor description: dexcom  Results statistics:   CGM use % of time 85  Average and SD 194/57  Time in range     40   %  % Time Above 180 45  % Time above 250 15  % Time Below target 0   Glycemic patterns summary: BGs are optimal overnight and fluctuate during the day  Hyperglycemic episodes postprandial  Hypoglycemic episodes occurred N/A  Overnight periods: Optimal       DIABETIC COMPLICATIONS: Microvascular complications:  Denies:  Last Eye Exam: Completed 2023- schedule 05/2024  Macrovascular complications:  CVA 07/2023 Denies: CAD, PVD   HISTORY:  Past Medical History:  Past Medical History:  Diagnosis Date   Arthritis    osteoarthritis- knees.   Atrial fibrillation (HCC)    Dr. Andy Bannister status post cardioversion chronic anticoagulation   Carotid artery occlusion    Cellulitis of right lower extremity 12/30/2020   Chicken pox    Colon cancer (HCC) 2001   Radiation, chemo.  Adenocarcinoma of the colon.  Followed by Dr. Andrena Ke   Complication of anesthesia    sensitive to narcotics"extra sleepy". Versed - "no memory recall for 4 days postop"   Diabetes mellitus without complication (HCC)    oral and Insulin    Diarrhea 07/09/2023   Hepatitis    "sub clinical case of Hepatitis in late 70's""has immunities"   Hypertension    Hypothyroidism    Laceration  of head 08/04/2019   8 cm laceration forehead/scalp after tripping when mowing her lawn.   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   Ovarian teratoma, right 09/2000   Oophorectomy   Sleep apnea    unable to tolreate cpap   Wisdom teeth extracted    Word finding difficulty 07/09/2023   Past Surgical History:  Past Surgical History:  Procedure Laterality Date   ABDOMINAL HYSTERECTOMY  09/2000    Total hysterectomy  and oophorectomy   ABDOMINAL WOUND DEHISCENCE Bilateral    multiple times / mesh use with multiple abdominal hernia repair   CARDIOVERSION  2014   Unsuccessful.  Patient reports cardiac ablation was not felt to be appropriate next step by cardiology.   CHOLECYSTECTOMY  04/1998   '98 -Lapraroscopic   COLON RESECTION  2001   "colon cancer"   FOOT SURGERY Bilateral    multiple "hammer toes and bunionectomies"   KNEE ARTHROSCOPY Left 08/2006   meniscectomy   MELANOMA EXCISION     near scapula   TOTAL KNEE ARTHROPLASTY Right 01/24/2016   Procedure: RIGHT TOTAL KNEE ARTHROPLASTY;  Surgeon: Liliane Rei, MD;  Location: [facility];  Service: Orthopedics;  Laterality: Right;   Social History:  reports that she quit smoking about 27 years ago. Her smoking use included cigarettes. She started smoking about 52 years ago. She has never used smokeless tobacco. She reports that she does not drink alcohol and does not use drugs. Family History:  Family History  Problem Relation Age of Onset   Arthritis Mother    Colon cancer Mother    Diabetes Mother    Atrial fibrillation Mother    Diabetes Father    Heart attack Father    Heart disease Father    Stroke Father    Hypertension Maternal Grandmother    Stroke Maternal Grandmother    Stroke Maternal Grandfather    Hypertension Paternal Grandmother    Stroke Paternal Grandmother    Diabetes Paternal Grandfather    Hypertension Paternal Grandfather    Stroke Paternal Grandfather    Diabetes Brother    Brain cancer Brother    Heart disease Brother    Hypertension Brother    Stroke Brother      HOME MEDICATIONS: Allergies as of 05/02/2024       Reactions   Amoxicillin Shortness Of Breath, Swelling, Anaphylaxis   Has patient had a PCN reaction causing immediate rash, facial/tongue/throat swelling, SOB or lightheadedness with hypotension: Yes Has patient had a PCN reaction causing severe rash involving mucus membranes or skin necrosis: No Has  patient had a PCN reaction that required hospitalization No Has patient had a PCN reaction occurring within the last 10 years: Yes If all of the above answers are "NO", then may proceed with Cephalosporin use. Has patient had a PCN reaction causing immediate rash, facial/tongue/throat swelling, SOB or lightheadedness with hypotension: Yes Has patient had a PCN reaction causing severe rash involving mucus membranes or skin necrosis: No Has patient had a PCN reaction that required hospitalization No Has patient had a PCN reaction occurring within the last 10 years: Yes If all of the above answers are "NO", then may proceed with Cephalosporin use. Has patient had a PCN reaction causing immediate rash, facial/tongue/throat swelling, SOB or lightheadedness with hypotension: Yes Has patient had a PCN reaction causing severe rash involving mucus membranes or  skin necrosis: No Has patient had a PCN reaction that required hospitalization No Has patient had a PCN reaction occurring within the last 10 years: Yes If all of the above answers are "NO", then may proceed with Cephalosporin use.   Ampicillin    Codeine Itching   Tolerates hydrocodone    Ezetimibe    Livalo [pitavastatin]    Statins Other (See Comments)   Muscle skeletal side effects   Welchol [colesevelam]    Gi se        Medication List        Accurate as of May 02, 2024 10:02 AM. If you have any questions, ask your nurse or doctor.          allopurinol  100 MG tablet Commonly known as: ZYLOPRIM  Take 1 tablet (100 mg total) by mouth daily.   ALPRAZolam  0.25 MG tablet Commonly known as: XANAX  Take 1 tablet (0.25 mg total) by mouth daily as needed for anxiety.   Basaglar  KwikPen 100 UNIT/ML Inject 46 Units into the skin every morning.   colchicine  0.6 MG tablet 2 tabs at onset of flare, followed by 1 tab 2 hours later. Then continue 1 tab daily until flare is resolved.   cyanocobalamin  1000 MCG/ML injection Commonly  known as: VITAMIN B12 Inject 1 mL (1,000 mcg total) into the muscle every 14 (fourteen) days.   dabigatran  150 MG Caps capsule Commonly known as: PRADAXA  Take 1 capsule (150 mg total) by mouth 2 (two) times daily.   Dexcom G6 Transmitter Misc USE AS DIRECTED **DX.E11.69   Dexcom G7 Sensor Misc 1 DEVICE BY DOES NOT APPLY ROUTE AS DIRECTED.   EpiPen 2-Pak 0.3 MG/0.3ML Soaj injection Generic drug: EPINEPHrine Inject 0.3 mg into the skin as needed. Bee stings   Insulin  Pen Needle 30G X 6 MM Misc by Does not apply route.   Insulin  Pen Needle 29G X Misc Use to inject insulin    Insulin  Pen Needle 31G X 6 MM Misc Use to inject insulin  up to 5 times a day   Insulin  Pen Needle 29G X 12.7MM Misc Use to inject insulin  5 times daily   BD Pen Needle Micro U/F 32G X 6 MM Misc Generic drug: Insulin  Pen Needle Use to inject insulin  5 times daily.   levothyroxine  50 MCG tablet Commonly known as: SYNTHROID  Take 1 tablet (50 mcg total) by mouth daily before breakfast.   metoprolol  tartrate 50 MG tablet Commonly known as: LOPRESSOR  Take 1 tablet (50 mg total) by mouth 2 (two) times daily.   NovoLOG  FlexPen 100 UNIT/ML FlexPen Generic drug: insulin  aspart Max daily 60 units   olmesartan -hydrochlorothiazide  40-25 MG tablet Commonly known as: BENICAR  HCT Take 1 tablet by mouth daily.   OneTouch Ultra test strip Generic drug: glucose blood SMARTSIG:Via Meter   PRESCRIPTION MEDICATION Inject 50,000 mcg as directed every 14 (fourteen) days. Vitamin B12 injections   triamcinolone  ointment 0.5 % Commonly known as: KENALOG  Apply topically 2 (two) times daily as needed.   Vitamin D  (Ergocalciferol ) 1.25 MG (50000 UNIT) Caps capsule Commonly known as: DRISDOL  1 CAP ON MONDAY AND THURSDAY WITH FOOD   zolpidem  5 MG tablet Commonly known as: AMBIEN  Take 1 tablet (5 mg total) by mouth at bedtime as needed.         OBJECTIVE:   Vital Signs: BP 118/76 (BP Location: Left Arm,  Patient Position: Sitting, Cuff Size: Normal)   Pulse 94   Ht 5\' 8"  (1.727 m)   Wt 242 lb (109.8 kg)  SpO2 95%   BMI 36.80 kg/m   Wt Readings from Last 3 Encounters:  05/02/24 242 lb (109.8 kg)  02/22/24 249 lb 6.4 oz (113.1 kg)  02/21/24 252 lb (114.3 kg)     Exam: General: Pt appears well and is in NAD  Lungs: Clear with good BS bilat   Heart: RRR   Extremities: Non pitting  pretibial edema.   Neuro: MS is good with appropriate affect, pt is alert and Ox3   DM Foot Exam 07/27/2023 The skin of the feet is  without sores or ulcerations, pt noted with discoloration of the 1st and 2nd right toes, cold to the touch  The pedal pulses are 1+ on right and 1+ on left. The sensation is intact to a screening 5.07, 10 gram monofilament bilaterally        DATA REVIEWED:  Lab Results  Component Value Date   HGBA1C 7.0 (A) 05/02/2024   HGBA1C 7.7 (A) 01/02/2024   HGBA1C 9.5 (H) 07/18/2023     Latest Reference Range & Units 02/19/24 10:54  Sodium 135 - 145 mEq/L 138  Potassium 3.5 - 5.1 mEq/L 4.0  Chloride 96 - 112 mEq/L 99  CO2 19 - 32 mEq/L 29  Glucose 70 - 99 mg/dL 829 (H)  BUN 6 - 23 mg/dL 31 (H)  Creatinine 5.62 - 1.20 mg/dL 1.30  Calcium 8.4 - 86.5 mg/dL 9.4  Alkaline Phosphatase 39 - 117 U/L 80  Albumin 3.5 - 5.2 g/dL 4.0  Uric Acid, Serum 2.4 - 7.0 mg/dL 6.6  AST 0 - 37 U/L 15  ALT 0 - 35 U/L 12  Total Protein 6.0 - 8.3 g/dL 6.7  Total Bilirubin 0.2 - 1.2 mg/dL 0.8  GFR >78.46 mL/min 50.01 (L)    Latest Reference Range & Units 02/01/24 15:38  Total CHOL/HDL Ratio 0.0 - 4.4 ratio 6.0 (H)  Cholesterol, Total 100 - 199 mg/dL 962 (H)  HDL Cholesterol >39 mg/dL 37 (L)  Triglycerides 0 - 149 mg/dL 952 (H)  VLDL Cholesterol Cal 5 - 40 mg/dL 49 (H)  LDL Chol Calc (NIH) 0 - 99 mg/dL 841 (H)    Old records , labs and images have been reviewed.     ASSESSMENT / PLAN / RECOMMENDATIONS:   1) Type 2 Diabetes Mellitus, Optimally  controlled, without complications -  Most recent A1c of 7.0 %. Goal A1c < 7.0 %.    -A1c is optimal -In office BG 154 Mg/DL -Intolerant to metformin  and SGLT2 inhibitors - She does have changes in stool consistency, this is not attributed to Ozempic , Ozempic  has been cost prohibitive at > $700 .  She does not qualify for patient assistance either.  So we discussed that she will do without it at this time and continue to check glucose with the Dexcom and take insulin  as prescribed  MEDICATIONS: Continue  Basaglar  to 46 units daily Take NovoLog  10 units before each meal  Correction scale: Novolog  (BG-130/15) TIDQAC   EDUCATION / INSTRUCTIONS: BG monitoring instructions: Patient is instructed to check her blood sugars 3 times a day, before meals . Call [facility] if: BG persistently < 70  I reviewed the Rule of 15 for the treatment of hypoglycemia in detail with the patient. Literature supplied.    2) Dyslipidemia :  - Per cardiology  - History of statin myopathy    F/U in 4 months       Signed electronically by: Natale Bail, MD  [facility]  [facility] [address] [place], [place] [address] Phone: [phone] FAX: [phone]   CC: Mariel Shope, DO [address] [place] [address] Phone: [phone]  Fax: [phone]  Return to Endocrinology clinic as below: Future Appointments  Date Time Provider Department Center  05/02/2024 10:10 AM Nadja Lina, Julian Obey, MD [facility] None  05/26/2024  1:15 PM [facility] None  07/16/2024  8:50 AM [facility] ANNUAL WELLNESS VISIT [facility]  08/05/2024 10:00 AM Kuneff, Renee A, DO [facility]

## 2024-05-09 ENCOUNTER — Other Ambulatory Visit: Payer: Self-pay | Admitting: Family Medicine

## 2024-05-13 ENCOUNTER — Other Ambulatory Visit: Payer: Self-pay | Admitting: Family Medicine

## 2024-05-26 ENCOUNTER — Ambulatory Visit

## 2024-05-26 VITALS — BP 120/82 | HR 82 | Temp 97.7°F | Resp 16 | Ht 68.0 in | Wt 251.0 lb

## 2024-05-26 DIAGNOSIS — I700 Atherosclerosis of aorta: Secondary | ICD-10-CM | POA: Diagnosis not present

## 2024-05-26 DIAGNOSIS — I6350 Cerebral infarction due to unspecified occlusion or stenosis of unspecified cerebral artery: Secondary | ICD-10-CM | POA: Diagnosis not present

## 2024-05-26 MED ORDER — INCLISIRAN SODIUM 284 MG/1.5ML ~~LOC~~ SOSY
284.0000 mg | PREFILLED_SYRINGE | Freq: Once | SUBCUTANEOUS | Status: AC
  Administered 2024-05-26: 284 mg via SUBCUTANEOUS
  Filled 2024-05-26: qty 1.5

## 2024-05-26 NOTE — Progress Notes (Signed)
 Diagnosis: Hyperlipidemia  Provider:  Chilton Greathouse MD  Procedure: Injection  Leqvio (inclisiran), Dose: 284 mg, Site: subcutaneous, Number of injections: 1  Injection Site(s): Right arm  Discharge: Condition: Good, Destination: Home . AVS Provided  Performed by:  Nat Math, RN

## 2024-06-11 DIAGNOSIS — E113292 Type 2 diabetes mellitus with mild nonproliferative diabetic retinopathy without macular edema, left eye: Secondary | ICD-10-CM | POA: Diagnosis not present

## 2024-06-11 DIAGNOSIS — Z794 Long term (current) use of insulin: Secondary | ICD-10-CM | POA: Diagnosis not present

## 2024-07-10 DIAGNOSIS — L814 Other melanin hyperpigmentation: Secondary | ICD-10-CM | POA: Diagnosis not present

## 2024-07-16 ENCOUNTER — Ambulatory Visit (INDEPENDENT_AMBULATORY_CARE_PROVIDER_SITE_OTHER): Payer: Medicare Other | Admitting: *Deleted

## 2024-07-16 VITALS — Ht 68.0 in | Wt 251.0 lb

## 2024-07-16 DIAGNOSIS — Z0000 Encounter for general adult medical examination without abnormal findings: Secondary | ICD-10-CM

## 2024-07-16 NOTE — Patient Instructions (Signed)
 [name] , Thank you for taking time to come for your Medicare Wellness Visit. I appreciate your ongoing commitment to your health goals. Please review the following plan we discussed and let me know if I can assist you in the future.   Screening recommendations/referrals: Colonoscopy: up to date Mammogram: up to date Bone Density: Education provided Recommended yearly ophthalmology/optometry visit for glaucoma screening and checkup Recommended yearly dental visit for hygiene and checkup  Vaccinations: Influenza vaccine: Education provided Pneumococcal vaccine: up to date Tdap vaccine: up to date       Preventive Care 65 Years and Older, Female Preventive care refers to lifestyle choices and visits with your health care provider that can promote health and wellness. What does preventive care include? A yearly physical exam. This is also called an annual well check. Dental exams once or twice a year. Routine eye exams. Ask your health care provider how often you should have your eyes checked. Personal lifestyle choices, including: Daily care of your teeth and gums. Regular physical activity. Eating a healthy diet. Avoiding tobacco and drug use. Limiting alcohol use. Practicing safe sex. Taking low-dose aspirin every day. Taking vitamin and mineral supplements as recommended by your health care provider. What happens during an annual well check? The services and screenings done by your health care provider during your annual well check will depend on your age, overall health, lifestyle risk factors, and family history of disease. Counseling  Your health care provider may ask you questions about your: Alcohol use. Tobacco use. Drug use. Emotional well-being. Home and relationship well-being. Sexual activity. Eating habits. History of falls. Memory and ability to understand (cognition). Work and work Astronomer. Reproductive health. Screening  You may have the  following tests or measurements: Height, weight, and BMI. Blood pressure. Lipid and cholesterol levels. These may be checked every 5 years, or more frequently if you are over 56 years old. Skin check. Lung cancer screening. You may have this screening every year starting at age 52 if you have a 30-pack-year history of smoking and currently smoke or have quit within the past 15 years. Fecal occult blood test (FOBT) of the stool. You may have this test every year starting at age 24. Flexible sigmoidoscopy or colonoscopy. You may have a sigmoidoscopy every 5 years or a colonoscopy every 10 years starting at age 20. Hepatitis C blood test. Hepatitis B blood test. Sexually transmitted disease (STD) testing. Diabetes screening. This is done by checking your blood sugar (glucose) after you have not eaten for a while (fasting). You may have this done every 1-3 years. Bone density scan. This is done to screen for osteoporosis. You may have this done starting at age 55. Mammogram. This may be done every 1-2 years. Talk to your health care provider about how often you should have regular mammograms. Talk with your health care provider about your test results, treatment options, and if necessary, the need for more tests. Vaccines  Your health care provider may recommend certain vaccines, such as: Influenza vaccine. This is recommended every year. Tetanus, diphtheria, and acellular pertussis (Tdap, Td) vaccine. You may need a Td booster every 10 years. Zoster vaccine. You may need this after age 46. Pneumococcal 13-valent conjugate (PCV13) vaccine. One dose is recommended after age 43. Pneumococcal polysaccharide (PPSV23) vaccine. One dose is recommended after age 77. Talk to your health care provider about which screenings and vaccines you need and how often you need them. This information is not intended to replace  advice given to you by your health care provider. Make sure you discuss any questions you  have with your health care provider. Document Released: 12/24/2015 Document Revised: 08/16/2016 Document Reviewed: 09/28/2015 Elsevier Interactive Patient Education  2017 [employer].  Fall Prevention in the Home Falls can cause injuries. They can happen to people of all ages. There are many things you can do to make your home safe and to help prevent falls. What can I do on the outside of my home? Regularly fix the edges of walkways and driveways and fix any cracks. Remove anything that might make you trip as you walk through a door, such as a raised step or threshold. Trim any bushes or trees on the path to your home. Use bright outdoor lighting. Clear any walking paths of anything that might make someone trip, such as rocks or tools. Regularly check to see if handrails are loose or broken. Make sure that both sides of any steps have handrails. Any raised decks and porches should have guardrails on the edges. Have any leaves, snow, or ice cleared regularly. Use sand or salt on walking paths during winter. Clean up any spills in your garage right away. This includes oil or grease spills. What can I do in the bathroom? Use night lights. Install grab bars by the toilet and in the tub and shower. Do not use towel bars as grab bars. Use non-skid mats or decals in the tub or shower. If you need to sit down in the shower, use a plastic, non-slip stool. Keep the floor dry. Clean up any water that spills on the floor as soon as it happens. Remove soap buildup in the tub or shower regularly. Attach bath mats securely with double-sided non-slip rug tape. Do not have throw rugs and other things on the floor that can make you trip. What can I do in the bedroom? Use night lights. Make sure that you have a light by your bed that is easy to reach. Do not use any sheets or blankets that are too big for your bed. They should not hang down onto the floor. Have a firm chair that has side arms. You can  use this for support while you get dressed. Do not have throw rugs and other things on the floor that can make you trip. What can I do in the kitchen? Clean up any spills right away. Avoid walking on wet floors. Keep items that you use a lot in easy-to-reach places. If you need to reach something above you, use a strong step stool that has a grab bar. Keep electrical cords out of the way. Do not use floor polish or wax that makes floors slippery. If you must use wax, use non-skid floor wax. Do not have throw rugs and other things on the floor that can make you trip. What can I do with my stairs? Do not leave any items on the stairs. Make sure that there are handrails on both sides of the stairs and use them. Fix handrails that are broken or loose. Make sure that handrails are as long as the stairways. Check any carpeting to make sure that it is firmly attached to the stairs. Fix any carpet that is loose or worn. Avoid having throw rugs at the top or bottom of the stairs. If you do have throw rugs, attach them to the floor with carpet tape. Make sure that you have a light switch at the top of the stairs and the bottom  of the stairs. If you do not have them, ask someone to add them for you. What else can I do to help prevent falls? Wear shoes that: Do not have high heels. Have rubber bottoms. Are comfortable and fit you well. Are closed at the toe. Do not wear sandals. If you use a stepladder: Make sure that it is fully opened. Do not climb a closed stepladder. Make sure that both sides of the stepladder are locked into place. Ask someone to hold it for you, if possible. Clearly mark and make sure that you can see: Any grab bars or handrails. First and last steps. Where the edge of each step is. Use tools that help you move around (mobility aids) if they are needed. These include: Canes. Walkers. Scooters. Crutches. Turn on the lights when you go into a dark area. Replace any light  bulbs as soon as they burn out. Set up your furniture so you have a clear path. Avoid moving your furniture around. If any of your floors are uneven, fix them. If there are any pets around you, be aware of where they are. Review your medicines with your doctor. Some medicines can make you feel dizzy. This can increase your chance of falling. Ask your doctor what other things that you can do to help prevent falls. This information is not intended to replace advice given to you by your health care provider. Make sure you discuss any questions you have with your health care provider. Document Released: 09/23/2009 Document Revised: 05/04/2016 Document Reviewed: 01/01/2015 Elsevier Interactive Patient Education  2017 [employer].

## 2024-07-16 NOTE — Progress Notes (Signed)
 Subjective:   Ann [name] is a 73 y.o. female who presents for Medicare Annual (Subsequent) preventive examination.  Visit Complete: Virtual I connected with  [name] Done on 07/16/24 by a audio enabled telemedicine application and verified that I am speaking with the correct person using two identifiers.  Patient Location: Home  Provider Location: Home Office  I discussed the limitations of evaluation and management by telemedicine. The patient expressed understanding and agreed to proceed.  Vital Signs: Because this visit was a virtual/telehealth visit, some criteria may be missing or patient reported. Any vitals not documented were not able to be obtained and vitals that have been documented are patient reported.   Cardiac Risk Factors include: advanced age (>71men, >57 women);diabetes mellitus;obesity (BMI >30kg/m2)     Objective:    Today's Vitals   07/16/24 0848  Weight: 251 lb (113.9 kg)  Height: 5' 8 (1.727 m)   Body mass index is 38.16 kg/m.     07/16/2024    8:46 AM 02/22/2024    1:19 PM 02/21/2024   11:10 AM 09/03/2023   11:08 AM 08/24/2023    8:02 AM 07/18/2023   11:27 AM 07/11/2023    9:12 AM  Advanced Directives  Does Patient Have a Medical Advance Directive? Yes Yes Yes Yes Yes Yes Yes  Type of Advance Directive Living will Healthcare Power of [employer] Power of [employer] Power of [employer] Power of [employer] Power of [place];Living will  Does patient want to make changes to medical advance directive?   No - Patient declined  No - Patient declined    Copy of Healthcare Power of Attorney in Chart?   No - copy requested No - copy requested No - copy requested  No - copy requested    Current Medications (verified) Outpatient Encounter Medications as of 07/16/2024  Medication Sig   allopurinol  (ZYLOPRIM ) 100 MG tablet Take 1 tablet (100 mg total) by mouth daily.   ALPRAZolam  (XANAX ) 0.25 MG tablet Take 1 tablet  (0.25 mg total) by mouth daily as needed for anxiety.   colchicine  0.6 MG tablet 2 tabs at onset of flare, followed by 1 tab 2 hours later. Then continue 1 tab daily until flare is resolved.   Continuous Glucose Sensor (DEXCOM G7 SENSOR) MISC 1 DEVICE BY DOES NOT APPLY ROUTE AS DIRECTED.   Continuous Glucose Transmitter (DEXCOM G6 TRANSMITTER) MISC USE AS DIRECTED **DX.E11.69   cyanocobalamin  (VITAMIN B12) 1000 MCG/ML injection Inject 1 mL (1,000 mcg total) into the muscle every 14 (fourteen) days.   dabigatran  (PRADAXA ) 150 MG CAPS capsule Take 1 capsule (150 mg total) by mouth 2 (two) times daily.   EPIPEN 2-PAK 0.3 MG/0.3ML SOAJ injection Inject 0.3 mg into the skin as needed. Bee stings   insulin  aspart (NOVOLOG  FLEXPEN) 100 UNIT/ML FlexPen Max daily 60 units   Insulin  Glargine (BASAGLAR  KWIKPEN) 100 UNIT/ML Inject 46 Units into the skin every morning.   Insulin  Pen Needle (BD PEN NEEDLE MICRO U/F) 32G X 6 MM MISC Use to inject insulin  5 times daily.   Insulin  Pen Needle 29G X 12.7MM MISC Use to inject insulin  5 times daily   Insulin  Pen Needle 29G X MISC Use to inject insulin    Insulin  Pen Needle 30G X 6 MM MISC by Does not apply route.   Insulin  Pen Needle 31G X 6 MM MISC Use to inject insulin  up to 5 times a day   levothyroxine  (SYNTHROID ) 50 MCG tablet Take  1 tablet (50 mcg total) by mouth daily before breakfast.   metoprolol  tartrate (LOPRESSOR ) 50 MG tablet TAKE 1 TABLET TWICE A DAY   olmesartan -hydrochlorothiazide  (BENICAR  HCT) 40-25 MG tablet Take 1 tablet by mouth daily.   ONETOUCH ULTRA test strip SMARTSIG:Via Meter   PRESCRIPTION MEDICATION Inject 50,000 mcg as directed every 14 (fourteen) days. Vitamin B12 injections   tirzepatide  (MOUNJARO ) 5 MG/0.5ML Pen Inject 5 mg into the skin once a week.   triamcinolone  ointment (KENALOG ) 0.5 % Apply topically 2 (two) times daily as needed.   Vitamin D , Ergocalciferol , (DRISDOL ) 1.25 MG (50000 UNIT) CAPS capsule TAKE 1 CAPSULE BY MOUTH  ON MONDAY AND THURSDAY WITH FOOD   zolpidem  (AMBIEN ) 5 MG tablet Take 1 tablet (5 mg total) by mouth at bedtime as needed.   No facility-administered encounter medications on file as of 07/16/2024.    Allergies (verified) Amoxicillin, Ampicillin, Codeine, Ezetimibe, Livalo [pitavastatin], Statins, and Welchol [colesevelam]   History: Past Medical History:  Diagnosis Date   Arthritis    osteoarthritis- knees.   Atrial fibrillation (HCC)    Dr. Debby status post cardioversion chronic anticoagulation   Carotid artery occlusion    Cellulitis of right lower extremity 12/30/2020   Chicken pox    Colon cancer (HCC) 2001   Radiation, chemo.  Adenocarcinoma of the colon.  Followed by Dr. Dallie   Complication of anesthesia    sensitive to narcoticsextra sleepy. Versed - no memory recall for 4 days postop   Diabetes mellitus without complication (HCC)    oral and Insulin    Diarrhea 07/09/2023   Hepatitis    sub clinical case of Hepatitis in late 70'shas immunities   Hypertension    Hypothyroidism    Laceration of head 08/04/2019   8 cm laceration forehead/scalp after tripping when mowing her lawn.   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   Ovarian teratoma, right 09/2000   Oophorectomy   Sleep apnea    unable to tolreate cpap   Wisdom teeth extracted    Word finding difficulty 07/09/2023   Past Surgical History:  Procedure Laterality Date   ABDOMINAL HYSTERECTOMY  09/2000    Total hysterectomy and oophorectomy   ABDOMINAL WOUND DEHISCENCE Bilateral    multiple times / mesh use with multiple abdominal hernia repair   CARDIOVERSION  2014   Unsuccessful.  Patient reports cardiac ablation was not felt to be appropriate next step by cardiology.   CHOLECYSTECTOMY  04/1998   '98 -Lapraroscopic   COLON RESECTION  2001   colon cancer   FOOT SURGERY Bilateral     multiple hammer toes and bunionectomies   KNEE ARTHROSCOPY Left 08/2006   meniscectomy   MELANOMA EXCISION     near scapula   TOTAL KNEE ARTHROPLASTY Right 01/24/2016   Procedure: RIGHT TOTAL KNEE ARTHROPLASTY;  Surgeon: Dempsey Moan, MD;  Location: [facility];  Service: Orthopedics;  Laterality: Right;   Family History  Problem Relation Age of Onset   Arthritis Mother    Colon cancer Mother    Diabetes Mother    Atrial fibrillation Mother    Diabetes Father    Heart attack Father    Heart disease Father    Stroke Father    Hypertension Maternal Grandmother    Stroke Maternal Grandmother    Stroke Maternal Grandfather    Hypertension Paternal Grandmother    Stroke  Paternal Grandmother    Diabetes Paternal Grandfather    Hypertension Paternal Grandfather    Stroke Paternal Grandfather    Diabetes Brother    Brain cancer Brother    Heart disease Brother    Hypertension Brother    Stroke Brother    Social History   Socioeconomic History   Marital status: Single    Spouse name: Not on file   Number of children: 0   Years of education: 12   Highest education level: Not on file  Occupational History   Not on file  Tobacco Use   Smoking status: Former    Current packs/day: 0.00    Types: Cigarettes    Start date: 01/18/1972    Quit date: 01/17/1997    Years since quitting: 27.5   Smokeless tobacco: Never  Vaping Use   Vaping status: Never Used  Substance and Sexual Activity   Alcohol use: No   Drug use: No   Sexual activity: Not Currently  Other Topics Concern   Not on file  Social History Narrative   Marital status/children/pets: Single. G0P0.   Education/employment: Retired Charity fundraiser:      -smoke alarm in the home:Yes     - wears seatbelt: Yes     - Feels safe in their relationships: Yes   Right handed   Drinks caffeine prn   Lives alone   Two story home   retired   Chief Executive Officer [employer] Strain:  Low Risk  (07/16/2024)   Overall Financial Resource Strain (CARDIA)    Difficulty of Paying Living Expenses: Not hard at all  Food Insecurity: No Food Insecurity (07/16/2024)   Hunger Vital Sign    Worried About Running Out of Food in the Last Year: Never true    Ran Out of Food in the Last Year: Never true  Transportation Needs: No Transportation Needs (07/16/2024)   PRAPARE - Administrator, Civil Service (Medical): No    Lack of Transportation (Non-Medical): No  Physical Activity: Inactive (07/16/2024)   Exercise Vital Sign    Days of Exercise per Week: 0 days    Minutes of Exercise per Session: 0 min  Stress: No Stress Concern Present (07/16/2024)   [employer] of Occupational Health - Occupational Stress Questionnaire    Feeling of Stress: Not at all  Social Connections: Unknown (07/16/2024)   Social Connection and Isolation Panel    Frequency of Communication with Friends and Family: More than three times a week    Frequency of Social Gatherings with Friends and Family: Three times a week    Attends Religious Services: More than 4 times per year    Active Member of Clubs or Organizations: No    Attends Banker Meetings: Never    Marital Status: Not on file    Tobacco Counseling Counseling given: Not Answered   Clinical Intake:  Pre-visit preparation completed: Yes  Pain : No/denies pain     Diabetes: Yes CBG done?: No Did pt. bring in CBG monitor from home?: No  How often do you need to have someone help you when you read instructions, pamphlets, or other written materials from your doctor or pharmacy?: 1 - Never  Interpreter Needed?: No  Information entered by :: Mliss Graff LPN   Activities of Daily Living    07/16/2024    8:48 AM  In your present state of health, do you have any difficulty performing  the following activities:  Hearing? 0  Vision? 0  Difficulty concentrating or making decisions? 0  Walking or climbing stairs? 0   Dressing or bathing? 0  Doing errands, shopping? 0  Preparing Food and eating ? N  Using the Toilet? N  In the past six months, have you accidently leaked urine? N  Do you have problems with loss of bowel control? N  Managing your Medications? N  Managing your Finances? N  Housekeeping or managing your Housekeeping? N    Patient Care Team: Catherine Charlies LABOR, DO as PCP - General (Family Medicine) Ladona Heinz, MD as PCP - Cardiology (Cardiology) Ladona Heinz, MD as Consulting Physician (Cardiology) Ivin Kocher, MD as Referring Physician (Dermatology) [facility] Lauraine DEL, OD as Referring Physician Levorn Lenis, MD as Referring Physician (Orthotics) Sharman Doffing, MD as Referring Physician (Obstetrics and Gynecology) Lucio Elsie BROCKS, MD as Referring Physician (Gastroenterology) [facility], Donell Cardinal, MD as Attending Physician (Endocrinology)  Indicate any recent Medical Services you may have received from other than [facility] providers in the past year (date may be approximate).     Assessment:   This is a routine wellness examination for Azha.  Hearing/Vision screen Hearing Screening - Comments:: No trouble hearing Vision Screening - Comments:: [place] Up to date   Goals Addressed             This Visit's Progress    Patient Stated       Walk without cane       Depression Screen    07/16/2024    8:50 AM 02/22/2024    1:19 PM 07/11/2023    9:10 AM 07/09/2023   11:39 AM 04/04/2023    3:51 PM 07/05/2022    9:55 AM 02/14/2022   10:59 AM  PHQ 2/9 Scores  PHQ - 2 Score 0 0 0 0 0 0 0  PHQ- 9 Score 0      3    Fall Risk    07/16/2024    8:45 AM 02/22/2024    1:19 PM 02/21/2024   11:09 AM 08/24/2023    8:02 AM 07/11/2023    9:12 AM  Fall Risk   Falls in the past year? 1 0 0 0 0  Number falls in past yr: 0 0 0 0 0  Injury with Fall? 0  0 0 0  Risk for fall due to :     Impaired vision;Impaired balance/gait  Follow up Falls evaluation completed;Education provided;Falls  prevention discussed  Falls evaluation completed Falls evaluation completed Falls prevention discussed    MEDICARE RISK AT HOME: Medicare Risk at Home Any stairs in or around the home?: Yes If so, are there any without handrails?: No Home free of loose throw rugs in walkways, pet beds, electrical cords, etc?: Yes Adequate lighting in your home to reduce risk of falls?: Yes Life alert?: No Grab bars in the bathroom?: Yes Shower chair or bench in shower?: Yes Elevated toilet seat or a handicapped toilet?: Yes  TIMED UP AND GO:  Was the test performed?  No    Cognitive Function:      08/24/2023    9:00 AM  Montreal Cognitive Assessment   Visuospatial/ Executive (0/5) 5  Naming (0/3) 3  Attention: Read list of digits (0/2) 2  Attention: Read list of letters (0/1) 1  Attention: Serial 7 subtraction starting at 100 (0/3) 3  Language: Repeat phrase (0/2) 2  Language : Fluency (0/1) 0  Abstraction (0/2) 2  Delayed Recall (0/5) 4  Orientation (0/6) 6  Total 28  Adjusted Score (based on education) 28      07/16/2024    8:47 AM 07/11/2023    9:15 AM 07/05/2022    9:59 AM  6CIT Screen  What Year? 0 points 0 points 0 points  What month? 0 points 0 points 0 points  What time? 0 points 0 points   Count back from 20 0 points 0 points   Months in reverse 2 points 0 points   Repeat phrase 0 points 0 points   Total Score 2 points 0 points     Immunizations Immunization History  Administered Date(s) Administered   Fluad Quad(high Dose 65+) 09/26/2021, 09/16/2022   Fluad Trivalent(High Dose 65+) 09/18/2023   Influenza-Unspecified 09/26/2019   Moderna SARS-COV2 Booster Vaccination 09/02/2021   Moderna Sars-Covid-2 Vaccination 01/22/2020, 02/20/2020, 10/20/2020   Pfizer(Comirnaty)Fall Seasonal Vaccine 12 years and older 10/02/2022, 10/04/2023   Pneumococcal Conjugate-13 10/12/2014   Pneumococcal Polysaccharide-23 10/21/2019   Tdap 08/04/2019    TDAP status: Up to date  Flu  Vaccine status: Up to date  Pneumococcal vaccine status: Up to date  Covid-19 vaccine status: Information provided on how to obtain vaccines.   Qualifies for Shingles Vaccine? Yes   Zostavax completed No   Shingrix Completed?: No.    Education has been provided regarding the importance of this vaccine. Patient has been advised to call insurance company to determine out of pocket expense if they have not yet received this vaccine. Advised may also receive vaccine at local pharmacy or Health Dept. Verbalized acceptance and understanding.  Screening Tests Health Maintenance  Topic Date Due   OPHTHALMOLOGY EXAM  05/29/2024   INFLUENZA VACCINE  07/11/2024   FOOT EXAM  07/26/2024   Diabetic kidney evaluation - Urine ACR  09/20/2024   HEMOGLOBIN A1C  11/02/2024   Diabetic kidney evaluation - eGFR measurement  02/18/2025   Medicare Annual Wellness (AWV)  07/16/2025   Colonoscopy  11/19/2028   DTaP/Tdap/Td (2 - Td or Tdap) 08/03/2029   Pneumococcal Vaccine: 50+ Years  Completed   DEXA SCAN  Completed   Hepatitis B Vaccines  Aged Out   HPV VACCINES  Aged Out   Meningococcal B Vaccine  Aged Out   COVID-19 Vaccine  Discontinued   Hepatitis C Screening  Discontinued   Zoster Vaccines- Shingrix  Discontinued    Health Maintenance  Health Maintenance Due  Topic Date Due   OPHTHALMOLOGY EXAM  05/29/2024   INFLUENZA VACCINE  07/11/2024    Colorectal cancer screening: No longer required.   Mammogram status: Completed  . Repeat every year  Bone Density  Education provided will call if she wants to have order put in  Lung Cancer Screening: (Low Dose CT Chest recommended if Age 47-80 years, 20 pack-year currently smoking OR have quit w/in 15years.) does not qualify.   Lung Cancer Screening Referral:   Additional Screening:  Hepatitis C Screening:  discontinued   Vision Screening: Recommended annual ophthalmology exams for early detection of glaucoma and other disorders of the  eye. Is the patient up to date with their annual eye exam?  Yes  Who is the provider or what is the name of the office in which the patient attends annual eye exams? [facility]  If pt is not established with a provider, would they like to be referred to a provider to establish care? No .   Dental Screening: Recommended annual dental exams for proper oral hygiene  Nutrition Risk Assessment:  Has the  patient had any N/V/D within the last 2 months?  No  Does the patient have any non-healing wounds?  No  Has the patient had any unintentional weight loss or weight gain?  No   Diabetes:  Is the patient diabetic?  Yes  If diabetic, was a CBG obtained today?  No  Did the patient bring in their glucometer from home?  No  How often do you monitor your CBG's? monitor.   Financial Strains and Diabetes Management:  Are you having any financial strains with the device, your supplies or your medication? No .  Does the patient want to be seen by Chronic Care Management for management of their diabetes?  No  Would the patient like to be referred to a Nutritionist or for Diabetic Management?  No   Diabetic Exams:  Diabetic Eye Exam:.Pt has been advised about the importance in completing this exam.   Diabetic Foot Exam. Pt has been advised about the importance in completing this exam.    Community Resource Referral / Chronic Care Management: CRR required this visit?  No   CCM required this visit?  No     Plan:     I have personally reviewed and noted the following in the patient's chart:   Medical and social history Use of alcohol, tobacco or illicit drugs  Current medications and supplements including opioid prescriptions. Patient is currently taking opioid prescriptions. Information provided to patient regarding non-opioid alternatives. Patient advised to discuss non-opioid treatment plan with their provider. Functional ability and status Nutritional status Physical activity Advanced  directives List of other physicians Hospitalizations, surgeries, and ER visits in previous 12 months Vitals Screenings to include cognitive, depression, and falls Referrals and appointments  In addition, I have reviewed and discussed with patient certain preventive protocols, quality metrics, and best practice recommendations. A written personalized care plan for preventive services as well as general preventive health recommendations were provided to patient.     Mliss Graff, LPN   12/13/7972   After Visit Summary: (MyChart) Due to this being a telephonic visit, the after visit summary with patients personalized plan was offered to patient via MyChart   Nurse Notes:

## 2024-07-18 ENCOUNTER — Telehealth: Payer: Self-pay

## 2024-07-18 NOTE — Telephone Encounter (Signed)
 Auth Submission: NO AUTH NEEDED - renewal Site of care: Site of care: [facility] Payer: Medicare A/B with [employer] supplement Medication & CPT/J Code(s) submitted: Leqvio  (Inclisiran) J1306 Diagnosis Code:  Route of submission (phone, fax, portal):  Phone # Fax # Auth type: Buy/Bill PB Units/visits requested: 284mg  x 1 dose Reference number:  Approval from: 07/18/24 to 01/10/25

## 2024-08-01 ENCOUNTER — Other Ambulatory Visit: Payer: Self-pay | Admitting: Family Medicine

## 2024-08-05 ENCOUNTER — Ambulatory Visit (INDEPENDENT_AMBULATORY_CARE_PROVIDER_SITE_OTHER): Admitting: Family Medicine

## 2024-08-05 ENCOUNTER — Encounter: Payer: Self-pay | Admitting: Family Medicine

## 2024-08-05 VITALS — BP 124/82 | HR 74 | Temp 97.9°F | Wt 247.4 lb

## 2024-08-05 DIAGNOSIS — M109 Gout, unspecified: Secondary | ICD-10-CM

## 2024-08-05 DIAGNOSIS — K909 Intestinal malabsorption, unspecified: Secondary | ICD-10-CM | POA: Insufficient documentation

## 2024-08-05 DIAGNOSIS — I10 Essential (primary) hypertension: Secondary | ICD-10-CM

## 2024-08-05 DIAGNOSIS — G479 Sleep disorder, unspecified: Secondary | ICD-10-CM | POA: Diagnosis not present

## 2024-08-05 DIAGNOSIS — F419 Anxiety disorder, unspecified: Secondary | ICD-10-CM

## 2024-08-05 DIAGNOSIS — E1169 Type 2 diabetes mellitus with other specified complication: Secondary | ICD-10-CM

## 2024-08-05 DIAGNOSIS — E538 Deficiency of other specified B group vitamins: Secondary | ICD-10-CM | POA: Diagnosis not present

## 2024-08-05 DIAGNOSIS — I4821 Permanent atrial fibrillation: Secondary | ICD-10-CM | POA: Diagnosis not present

## 2024-08-05 DIAGNOSIS — R197 Diarrhea, unspecified: Secondary | ICD-10-CM | POA: Diagnosis not present

## 2024-08-05 DIAGNOSIS — E063 Autoimmune thyroiditis: Secondary | ICD-10-CM | POA: Diagnosis not present

## 2024-08-05 DIAGNOSIS — E785 Hyperlipidemia, unspecified: Secondary | ICD-10-CM

## 2024-08-05 DIAGNOSIS — Z823 Family history of stroke: Secondary | ICD-10-CM

## 2024-08-05 DIAGNOSIS — I6350 Cerebral infarction due to unspecified occlusion or stenosis of unspecified cerebral artery: Secondary | ICD-10-CM

## 2024-08-05 DIAGNOSIS — I6381 Other cerebral infarction due to occlusion or stenosis of small artery: Secondary | ICD-10-CM

## 2024-08-05 LAB — COMPREHENSIVE METABOLIC PANEL WITH GFR
ALT: 20 U/L (ref 0–35)
AST: 20 U/L (ref 0–37)
Albumin: 3.8 g/dL (ref 3.5–5.2)
Alkaline Phosphatase: 95 U/L (ref 39–117)
BUN: 33 mg/dL — ABNORMAL HIGH (ref 6–23)
CO2: 24 meq/L (ref 19–32)
Calcium: 8.6 mg/dL (ref 8.4–10.5)
Chloride: 103 meq/L (ref 96–112)
Creatinine, Ser: 1.13 mg/dL (ref 0.40–1.20)
GFR: 47.74 mL/min — ABNORMAL LOW (ref >60.00)
Glucose, Bld: 117 mg/dL — ABNORMAL HIGH (ref 70–99)
Potassium: 3.6 meq/L (ref 3.5–5.1)
Sodium: 140 meq/L (ref 135–145)
Total Bilirubin: 0.9 mg/dL (ref 0.2–1.2)
Total Protein: 6.6 g/dL (ref 6.0–8.3)

## 2024-08-05 LAB — CBC
HCT: 40.9 % (ref 36.0–46.0)
Hemoglobin: 13.6 g/dL (ref 12.0–15.0)
MCHC: 33.2 g/dL (ref 30.0–36.0)
MCV: 93.2 fl (ref 78.0–100.0)
Platelets: 161 K/uL (ref 150.0–400.0)
RBC: 4.39 Mil/uL (ref 3.87–5.11)
RDW: 14.2 % (ref 11.5–15.5)
WBC: 7.2 K/uL (ref 4.0–10.5)

## 2024-08-05 LAB — VITAMIN B12: Vitamin B-12: 1500 pg/mL — ABNORMAL HIGH (ref 211–911)

## 2024-08-05 LAB — TSH: TSH: 3.69 u[IU]/mL (ref 0.35–5.50)

## 2024-08-05 LAB — T4, FREE: Free T4: 0.91 ng/dL (ref 0.60–1.60)

## 2024-08-05 MED ORDER — COLCHICINE 0.6 MG PO TABS: ORAL_TABLET | ORAL | 5 refills | Status: DC

## 2024-08-05 MED ORDER — CYANOCOBALAMIN 1000 MCG/ML IJ SOLN: 1000.0000 ug | INTRAMUSCULAR | 11 refills | Status: DC

## 2024-08-05 MED ORDER — ALLOPURINOL 100 MG PO TABS: 100.0000 mg | ORAL_TABLET | Freq: Every day | ORAL | 1 refills | Status: AC

## 2024-08-05 MED ORDER — METOPROLOL TARTRATE 50 MG PO TABS: 50.0000 mg | ORAL_TABLET | Freq: Two times a day (BID) | ORAL | 0 refills | Status: DC

## 2024-08-05 MED ORDER — ZOLPIDEM TARTRATE 5 MG PO TABS: 5.0000 mg | ORAL_TABLET | Freq: Every evening | ORAL | 5 refills | Status: DC | PRN

## 2024-08-05 MED ORDER — COLESEVELAM HCL 625 MG PO TABS: 1875.0000 mg | ORAL_TABLET | Freq: Two times a day (BID) | ORAL | 5 refills | Status: DC

## 2024-08-05 MED ORDER — ALLOPURINOL 100 MG PO TABS: 100.0000 mg | ORAL_TABLET | Freq: Every day | ORAL | 1 refills | Status: DC

## 2024-08-05 MED ORDER — OLMESARTAN MEDOXOMIL-HCTZ 40-25 MG PO TABS: 1.0000 | ORAL_TABLET | Freq: Every day | ORAL | 1 refills | Status: DC

## 2024-08-05 MED ORDER — LEVOTHYROXINE SODIUM 50 MCG PO TABS: 50.0000 ug | ORAL_TABLET | Freq: Every day | ORAL | 3 refills | Status: AC

## 2024-08-05 NOTE — Progress Notes (Signed)
 Patient ID: Ann [name], female  DOB: 1951-06-10, 73 y.o.   MRN: [id number] Patient Care Team    Relationship Specialty Notifications Start End  Catherine Charlies LABOR, DO PCP - General Family Medicine  07/18/23   Ladona Heinz, MD PCP - Cardiology Cardiology  02/01/24   Ladona Heinz, MD Consulting Physician Cardiology  04/20/20   Ivin Kocher, MD Referring Physician Dermatology  04/20/20   Elspeth Lauraine DEL, OD Referring Physician   04/20/20   Levorn Lenis, MD Referring Physician Orthotics  04/20/20   Sharman Doffing, MD Referring Physician Obstetrics and Gynecology  04/20/20   Lucio Elsie BROCKS, MD Referring Physician Gastroenterology  04/20/20   Shamleffer, Donell Cardinal, MD Attending Physician Endocrinology  01/31/23     Chief Complaint  Patient presents with   Hypertension   Hypothyroidism    Chronic Conditions/illness Management Pt is not fasting.     Subjective: Ann [name] is a 73 y.o.  female present for Chronic Conditions/illness Management and follow up on recent stroke, word finding difficulties and vitamin deficiencies b1, b12 and vit d.  HTN/HLD/morbid obesity/atrial fibrillation: Pt reports compliance with metoprolol  50 mg twice daily and olmesartan -HCTZ 40-25 mg daily. Patient denies chest pain, shortness of breath, dizziness or lower extremity edema.   RF: Hypertension, hyperlipidemia, obesity, diabetes, fib, family history of heart disease  Gout: Patient reports compliance with allopurinol  100 mg daily.  Sleep disturbance/anxiety Patient reports compliance with Ambien  5 mg nightly.  Without use she is not able to sleep.  She reports she is not taking the Xanax  much at all anymore and does not need refills today. She denies oversedation.   Diarrhea: Patient reports she is continue to have diarrhea since July 2024.  She has been seen by her GI doctors who did not feel additional workup was required.  She states she has daily yellowish colored diarrhea.  She is unable to  go out to eat secondary to fecal urgency after eating.  Her diabetic medications have been ruled out as cause.       07/16/2024    8:50 AM 02/22/2024    1:19 PM 07/11/2023    9:10 AM 07/09/2023   11:39 AM 04/04/2023    3:51 PM  Depression screen PHQ 2/9  Decreased Interest 0 0 0 0 0  Down, Depressed, Hopeless 0 0 0 0 0  PHQ - 2 Score 0 0 0 0 0  Altered sleeping 0      Tired, decreased energy 0      Change in appetite 0      Feeling bad or failure about yourself  0      Trouble concentrating 0      Moving slowly or fidgety/restless 0      Suicidal thoughts 0      PHQ-9 Score 0      Difficult doing work/chores Not difficult at all          02/14/2022   11:01 AM 09/12/2021   11:08 AM  GAD 7 : Generalized Anxiety Score  Nervous, Anxious, on Edge 0 1  Control/stop worrying 0 0  Worry too much - different things 0 0  Trouble relaxing 0 0  Restless 0 0  Easily annoyed or irritable 0 0  Afraid - awful might happen 0 0  Total GAD 7 Score 0 1          07/16/2024    8:45 AM 02/22/2024    1:19 PM 02/21/2024   11:09 AM  08/24/2023    8:02 AM 07/11/2023    9:12 AM  Fall Risk   Falls in the past year? 1 0 0 0 0  Number falls in past yr: 0 0 0 0 0  Injury with Fall? 0  0 0 0  Risk for fall due to :     Impaired vision;Impaired balance/gait  Follow up Falls evaluation completed;Education provided;Falls prevention discussed  Falls evaluation completed Falls evaluation completed Falls prevention discussed    Immunization History  Administered Date(s) Administered   Fluad Quad(high Dose 65+) 09/26/2021, 09/16/2022   Fluad Trivalent(High Dose 65+) 09/18/2023   Influenza-Unspecified 09/26/2019   Moderna SARS-COV2 Booster Vaccination 09/02/2021   Moderna Sars-Covid-2 Vaccination 01/22/2020, 02/20/2020, 10/20/2020   Pfizer(Comirnaty)Fall Seasonal Vaccine 12 years and older 10/02/2022, 10/04/2023   Pneumococcal Conjugate-13 10/12/2014   Pneumococcal Polysaccharide-23 10/21/2019   Tdap  08/04/2019    No results found.  Past Medical History:  Diagnosis Date   Arthritis    osteoarthritis- knees.   Atrial fibrillation (HCC)    Dr. Debby status post cardioversion chronic anticoagulation   Carotid artery occlusion    Cellulitis of right lower extremity 12/30/2020   Chicken pox    Colon cancer (HCC) 2001   Radiation, chemo.  Adenocarcinoma of the colon.  Followed by Dr. Dallie   Complication of anesthesia    sensitive to narcoticsextra sleepy. Versed - no memory recall for 4 days postop   Diabetes mellitus without complication (HCC)    oral and Insulin    Diarrhea 07/09/2023   Hepatitis    sub clinical case of Hepatitis in late 70'shas immunities   Hypertension    Hypothyroidism    Laceration of head 08/04/2019   8 cm laceration forehead/scalp after tripping when mowing her lawn.   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   Ovarian teratoma, right 09/2000   Oophorectomy   Sleep apnea    unable to tolreate cpap   Wisdom teeth extracted    Word finding difficulty 07/09/2023   Allergies  Allergen Reactions   Amoxicillin Shortness Of Breath, Swelling and Anaphylaxis    Has patient had a PCN reaction causing immediate rash, facial/tongue/throat swelling, SOB or lightheadedness with hypotension: Yes Has patient had a PCN reaction causing severe rash involving mucus membranes or skin necrosis: No Has patient had a PCN reaction that required hospitalization No Has patient had a PCN reaction occurring within the last 10 years: Yes If all of the above answers are NO, then may proceed with Cephalosporin use.  Has patient had a PCN reaction causing immediate rash, facial/tongue/throat swelling, SOB or lightheadedness with hypotension: Yes Has patient had a PCN reaction causing severe rash involving mucus membranes or skin necrosis: No Has patient had a PCN  reaction that required hospitalization No Has patient had a PCN reaction occurring within the last 10 years: Yes If all of the above answers are NO, then may proceed with Cephalosporin use. Has patient had a PCN reaction causing immediate rash, facial/tongue/throat swelling, SOB or lightheadedness with hypotension: Yes Has patient had a PCN reaction causing severe rash involving mucus membranes or skin necrosis: No Has patient had a PCN reaction that required hospitalization No Has patient had a PCN reaction occurring within the last 10 years: Yes If all of the above answers are NO, then may proceed with Cephalosporin use.    Ampicillin  Codeine Itching    Tolerates hydrocodone    Ezetimibe    Livalo [Pitavastatin]    Statins Other (See Comments)    Muscle skeletal side effects   Past Surgical History:  Procedure Laterality Date   ABDOMINAL HYSTERECTOMY  09/2000    Total hysterectomy and oophorectomy   ABDOMINAL WOUND DEHISCENCE Bilateral    multiple times / mesh use with multiple abdominal hernia repair   CARDIOVERSION  2014   Unsuccessful.  Patient reports cardiac ablation was not felt to be appropriate next step by cardiology.   CHOLECYSTECTOMY  04/1998   '98 -Lapraroscopic   COLON RESECTION  2001   colon cancer   FOOT SURGERY Bilateral    multiple hammer toes and bunionectomies   KNEE ARTHROSCOPY Left 08/2006   meniscectomy   MELANOMA EXCISION     near scapula   TOTAL KNEE ARTHROPLASTY Right 01/24/2016   Procedure: RIGHT TOTAL KNEE ARTHROPLASTY;  Surgeon: Dempsey Moan, MD;  Location: [facility];  Service: Orthopedics;  Laterality: Right;   Family History  Problem Relation Age of Onset   Arthritis Mother    Colon cancer Mother    Diabetes Mother    Atrial fibrillation Mother    Diabetes Father    Heart attack Father    Heart disease Father    Stroke Father    Hypertension Maternal Grandmother    Stroke Maternal Grandmother    Stroke Maternal Grandfather     Hypertension Paternal Grandmother    Stroke Paternal Grandmother    Diabetes Paternal Grandfather    Hypertension Paternal Grandfather    Stroke Paternal Grandfather    Diabetes Brother    Brain cancer Brother    Heart disease Brother    Hypertension Brother    Stroke Brother    Social History   Social History Narrative   Marital status/children/pets: Single. G0P0.   Education/employment: Retired Charity fundraiser:      -smoke alarm in the home:Yes     - wears seatbelt: Yes     - Feels safe in their relationships: Yes   Right handed   Drinks caffeine prn   Lives alone   Two story home   retired    Allergies as of 08/05/2024       Reactions   Amoxicillin Shortness Of Breath, Swelling, Anaphylaxis   Has patient had a PCN reaction causing immediate rash, facial/tongue/throat swelling, SOB or lightheadedness with hypotension: Yes Has patient had a PCN reaction causing severe rash involving mucus membranes or skin necrosis: No Has patient had a PCN reaction that required hospitalization No Has patient had a PCN reaction occurring within the last 10 years: Yes If all of the above answers are NO, then may proceed with Cephalosporin use. Has patient had a PCN reaction causing immediate rash, facial/tongue/throat swelling, SOB or lightheadedness with hypotension: Yes Has patient had a PCN reaction causing severe rash involving mucus membranes or skin necrosis: No Has patient had a PCN reaction that required hospitalization No Has patient had a PCN reaction occurring within the last 10 years: Yes If all of the above answers are NO, then may proceed with Cephalosporin use. Has patient had a PCN reaction causing immediate rash, facial/tongue/throat swelling, SOB or lightheadedness with hypotension: Yes Has patient had a PCN reaction causing severe rash involving mucus membranes or skin necrosis: No Has patient had a PCN reaction that required  hospitalization No Has patient had a PCN reaction occurring within the last 10 years: Yes If  all of the above answers are NO, then may proceed with Cephalosporin use.   Ampicillin    Codeine Itching   Tolerates hydrocodone    Ezetimibe    Livalo [pitavastatin]    Statins Other (See Comments)   Muscle skeletal side effects        Medication List        Accurate as of August 05, 2024 12:23 PM. If you have any questions, ask your nurse or doctor.          allopurinol  100 MG tablet Commonly known as: ZYLOPRIM  Take 1 tablet (100 mg total) by mouth daily.   ALPRAZolam  0.25 MG tablet Commonly known as: XANAX  Take 1 tablet (0.25 mg total) by mouth daily as needed for anxiety.   Basaglar  KwikPen 100 UNIT/ML Inject 46 Units into the skin every morning.   colchicine  0.6 MG tablet 2 tabs at onset of flare, followed by 1 tab 2 hours later. Then continue 1 tab daily until flare is resolved.   colesevelam  625 MG tablet Commonly known as: Welchol  Take 3 tablets (1,875 mg total) by mouth 2 (two) times daily with a meal. Started by: Charlies Bellini   cyanocobalamin  1000 MCG/ML injection Commonly known as: VITAMIN B12 Inject 1 mL (1,000 mcg total) into the muscle every 14 (fourteen) days.   dabigatran  150 MG Caps capsule Commonly known as: PRADAXA  Take 1 capsule (150 mg total) by mouth 2 (two) times daily.   Dexcom G6 Transmitter Misc USE AS DIRECTED **DX.E11.69   Dexcom G7 Sensor Misc 1 DEVICE BY DOES NOT APPLY ROUTE AS DIRECTED.   EpiPen 2-Pak 0.3 MG/0.3ML Soaj injection Generic drug: EPINEPHrine Inject 0.3 mg into the skin as needed. Bee stings   Insulin  Pen Needle 30G X 6 MM Misc by Does not apply route.   Insulin  Pen Needle 29G X Misc Use to inject insulin    Insulin  Pen Needle 31G X 6 MM Misc Use to inject insulin  up to 5 times a day   Insulin  Pen Needle 29G X 12.7MM Misc Use to inject insulin  5 times daily   BD Pen Needle Micro U/F 32G X 6 MM  Misc Generic drug: Insulin  Pen Needle Use to inject insulin  5 times daily.   levothyroxine  50 MCG tablet Commonly known as: SYNTHROID  Take 1 tablet (50 mcg total) by mouth daily before breakfast.   metoprolol  tartrate 50 MG tablet Commonly known as: LOPRESSOR  Take 1 tablet (50 mg total) by mouth 2 (two) times daily.   NovoLOG  FlexPen 100 UNIT/ML FlexPen Generic drug: insulin  aspart Max daily 60 units   olmesartan -hydrochlorothiazide  40-25 MG tablet Commonly known as: BENICAR  HCT Take 1 tablet by mouth daily.   OneTouch Ultra test strip Generic drug: glucose blood SMARTSIG:Via Meter   PRESCRIPTION MEDICATION Inject 50,000 mcg as directed every 14 (fourteen) days. Vitamin B12 injections   tirzepatide  5 MG/0.5ML Pen Commonly known as: MOUNJARO  Inject 5 mg into the skin once a week.   triamcinolone  ointment 0.5 % Commonly known as: KENALOG  Apply topically 2 (two) times daily as needed.   Vitamin D  (Ergocalciferol ) 1.25 MG (50000 UNIT) Caps capsule Commonly known as: DRISDOL  TAKE 1 CAPSULE BY MOUTH ON MONDAY AND THURSDAY WITH FOOD   zolpidem  5 MG tablet Commonly known as: AMBIEN  Take 1 tablet (5 mg total) by mouth at bedtime as needed.        All past medical history, surgical history, allergies, family history, immunizations andmedications were updated in the EMR today and reviewed under the history  and medication portions of their EMR.     ROS: 14 pt review of systems performed and negative (unless mentioned in an HPI)  Objective: BP 124/82   Pulse 74   Temp 97.9 F (36.6 C)   Wt 247 lb 6.4 oz (112.2 kg)   SpO2 97%   BMI 37.62 kg/m  Physical Exam Vitals and nursing note reviewed.  Constitutional:      General: She is not in acute distress.    Appearance: Normal appearance. She is obese. She is not ill-appearing, toxic-appearing or diaphoretic.  HENT:     Head: Normocephalic and atraumatic.  Eyes:     General: No scleral icterus.       Right eye: No  discharge.        Left eye: No discharge.     Extraocular Movements: Extraocular movements intact.     Conjunctiva/sclera: Conjunctivae normal.     Pupils: Pupils are equal, round, and reactive to light.  Cardiovascular:     Rate and Rhythm: Normal rate. Rhythm irregular.     Heart sounds: No murmur heard. Pulmonary:     Effort: Pulmonary effort is normal. No respiratory distress.     Breath sounds: Normal breath sounds. No wheezing, rhonchi or rales.  Musculoskeletal:     Right lower leg: No edema.     Left lower leg: No edema.  Skin:    General: Skin is warm.     Coloration: Skin is not pale.     Findings: No rash.  Neurological:     Mental Status: She is alert and oriented to person, place, and time. Mental status is at baseline.     Motor: No weakness.     Gait: Gait normal.  Psychiatric:        Mood and Affect: Mood normal.        Behavior: Behavior normal.        Thought Content: Thought content normal.        Judgment: Judgment normal.    Diabetic Foot Exam - Simple   Simple Foot Form Diabetic Foot exam was performed with the following findings: Yes 08/05/2024 12:14 PM  Visual Inspection No deformities, no ulcerations, no other skin breakdown bilaterally: Yes Sensation Testing Intact to touch and monofilament testing bilaterally: Yes Pulse Check Posterior Tibialis and Dorsalis pulse intact bilaterally: Yes Comments      Assessment/plan: Ann [name] is a 73 y.o. female present for Chronic Conditions/illness Management Anxiety/sleep disturbance Stable Continue Xanax  0.25 mg daily daily as needed.  She rarely uses this medicine as she reports she does not need refills today. She uses medications appropriately and understands risks of drug class. Continue Ambien  at the 5 mg dose for her.  She understands 5 mg is the maximum recommended dose for those in her age bracket.  If for some reason her Medicare plan will not cover this we can consider GoodRx  prescription since generic Ambien  is rather inexpensive. Patient understands State regulations on controlled substances and [facility] policy on prescribing controlled substances.  Contract UTD [place]  controlled substance database reviewed today 08/05/24 Patient aware a face-to-face follow-up will be needed if she would need refills after 6 months.  Patient aware she will need to request these scripts they will not be automatically refilled since they are controlled substances.  Chronic malabsorption diarrhea: Patient continues to have diarrhea since July 2024 with GI workup completed without cause. Diabetic medications have been ruled out as cause. Discussed bile acid sequestrant medications  with her today and Elavil.  She would prefer to try a pill retreatment over a powder.  She had been prescribed WelChol  in the past that caused some GI side effects she was constipation.  She would benefit from the addition of WelChol  for her diabetes and lipid treatment as well. Start WelChol  3 tabs twice daily with meals.  She understands to avoid taking any oral medications within a 4-hour window of welchol   Hypertension/morbid obesity/A-fib/h/o CVA/basal ganglia stroke:  Stable Continue  Pradaxa  twice daily> cardiology prescribes Continue metoprolol  50 mg twice daily Continue valsartan-HCTZ 40-25 daily Heart healthy diet recommended Statin declined She is established with cardio.  Added WelChol  to her regimen today for her complaints of diarrhea which also may help with her lipids and diabetes  DM w/ hyperlipidemia:  Diabetes managed by endocrine. Cholesterol has been elevated.  Heart healthy diet and routine exercise encouraged.  Medication management has been declined by patient for her lipids.  Diabetic eye exam 05/30/2023-if more recent exam available records will be requested Foot exam completed 08/05/2024 Pneumonia vac up-to-date Microalbumin completed 08/05/2024-patient could not  produce urine and took urine cup home to provide specimen on drop-off  Gout  Continue allopurinol  100 mg daily> [facility] - Continue colchicine  as needed - Patient was provided with low purine diet  B12 deficiency/Thiamine  deficiency/Vitamin D  deficiency/expressive aphasia - Vitamin B1> severely deficient <6 >supplementing> re-collected today - Vitamin D  (25 hydroxy)>supplementing > collected today - Vitamin B12> normal with supplement( b12 IM q 2 weeks)-patient needs to discontinue IM and try B12 1000 mcg sublingual daily.  Will retest B12 next visit to ensure this is maintaining levels for her.   Hypothyroidism due to acquired atrophy of thyroid  Stable Continue levothyroxine  50 mcg daily.  refills will be provided in appropriate dose based on lab result today TSH, T4 free collected today  Aortic atherosclerosis (HCC)/Coronary artery calcification of native artery/Basal ganglia stroke (HCC)/Subclavian artery stenosis (HCC)/Vertebral artery occlusion, left/Left carotid artery stenosis/ Internal carotid artery stenosis, bilateral-25% right and 50% left proximal ICA stenosis/Word finding difficulty/Expressive aphasia/Cerebrovascular accident (CVA) due to stenosis of cerebral artery (HCC)/ Continue to follow with cardiology and neurology for management   No follow-ups on file.  Orders Placed This Encounter  Procedures   Urine Microalbumin w/creat. ratio   TSH   T4, free   Comp Met (CMET)   CBC   B12    Meds ordered this encounter  Medications   DISCONTD: allopurinol  (ZYLOPRIM ) 100 MG tablet    Sig: Take 1 tablet (100 mg total) by mouth daily.    Dispense:  90 tablet    Refill:  1   colchicine  0.6 MG tablet    Sig: 2 tabs at onset of flare, followed by 1 tab 2 hours later. Then continue 1 tab daily until flare is resolved.    Dispense:  30 tablet    Refill:  5   levothyroxine  (SYNTHROID ) 50 MCG tablet    Sig: Take 1 tablet (50 mcg total) by mouth daily before breakfast.     Dispense:  90 tablet    Refill:  3   metoprolol  tartrate (LOPRESSOR ) 50 MG tablet    Sig: Take 1 tablet (50 mg total) by mouth 2 (two) times daily.    Dispense:  180 tablet    Refill:  0   olmesartan -hydrochlorothiazide  (BENICAR  HCT) 40-25 MG tablet    Sig: Take 1 tablet by mouth daily.    Dispense:  90 tablet    Refill:  1   DISCONTD: zolpidem  (AMBIEN ) 5 MG tablet    Sig: Take 1 tablet (5 mg total) by mouth at bedtime as needed.    Dispense:  30 tablet    Refill:  5   zolpidem  (AMBIEN ) 5 MG tablet    Sig: Take 1 tablet (5 mg total) by mouth at bedtime as needed.    Dispense:  30 tablet    Refill:  5   allopurinol  (ZYLOPRIM ) 100 MG tablet    Sig: Take 1 tablet (100 mg total) by mouth daily.    Dispense:  90 tablet    Refill:  1   colesevelam  (WELCHOL ) 625 MG tablet    Sig: Take 3 tablets (1,875 mg total) by mouth 2 (two) times daily with a meal.    Dispense:  180 tablet    Refill:  5   cyanocobalamin  (VITAMIN B12) 1000 MCG/ML injection    Sig: Inject 1 mL (1,000 mcg total) into the muscle every 14 (fourteen) days.    Dispense:  1 mL    Refill:  11    Referral Orders  No referral(s) requested today      Note is dictated utilizing voice recognition software. Although note has been proof read prior to signing, occasional typographical errors still can be missed. If any questions arise, please do not hesitate to call for verification.  Electronically signed by: Charlies Bellini, DO [facility] [place]

## 2024-08-05 NOTE — Patient Instructions (Signed)

## 2024-08-06 ENCOUNTER — Ambulatory Visit: Payer: Self-pay | Admitting: Family Medicine

## 2024-08-06 NOTE — Telephone Encounter (Signed)
 Please call patient - B12 is very well supplemented with results above 1000.  Can continue with her current version of B12 supplementation. - Kidney function was stable from prior collections. -Blood cell counts are normal -Thyroid  function is normal-refilled her levothyroxine  -I have called and welchol /colesevlam For her to take 2-3 tabs twice a day with food to help with her diarrhea symptoms. -Please remind her to drop off her microalbumin test at her earliest convenience

## 2024-08-06 NOTE — Progress Notes (Signed)
Pt advised of results and medication.

## 2024-08-13 ENCOUNTER — Telehealth: Payer: Self-pay | Admitting: Family Medicine

## 2024-08-13 NOTE — Telephone Encounter (Signed)
 Pt aware and verbalized understanding.

## 2024-08-13 NOTE — Telephone Encounter (Signed)
 Please call pt and remind her to return her microalbumin urine test, that must be completed yearly for diabetes. She had taken container home to return

## 2024-08-19 ENCOUNTER — Other Ambulatory Visit (INDEPENDENT_AMBULATORY_CARE_PROVIDER_SITE_OTHER)

## 2024-08-19 DIAGNOSIS — E785 Hyperlipidemia, unspecified: Secondary | ICD-10-CM | POA: Diagnosis not present

## 2024-08-19 DIAGNOSIS — E1169 Type 2 diabetes mellitus with other specified complication: Secondary | ICD-10-CM | POA: Diagnosis not present

## 2024-08-19 LAB — MICROALBUMIN / CREATININE URINE RATIO
Creatinine,U: 75.4 mg/dL
Microalb Creat Ratio: UNDETERMINED mg/g (ref 0.0–30.0)
Microalb, Ur: <0.7 mg/dL

## 2024-08-20 ENCOUNTER — Ambulatory Visit: Payer: Self-pay | Admitting: Family Medicine

## 2024-08-20 NOTE — Telephone Encounter (Signed)
 Communication  Reason for CRM: Patient called in regarding missed call, relay message for patient patient understood and had no further questions   No further action needed at this time.

## 2024-08-27 ENCOUNTER — Ambulatory Visit: Payer: Self-pay

## 2024-08-27 NOTE — Telephone Encounter (Signed)
 This RN attempted to reach patient. Left voicemail with call back number.   Message from [place] E sent at 08/27/2024  3:58 PM EDT  Summary: Diarrhea   Reason for Triage: Patient was prescribed medication for diarrhea and patient stated it is not helping. This has been a long-term issue. Patient is requesting a call back in the morning to discuss next steps. Please call on 9/18 in the morning.

## 2024-08-28 ENCOUNTER — Ambulatory Visit: Payer: Self-pay

## 2024-08-28 NOTE — Telephone Encounter (Signed)
 LVM to discuss. Sent MyChart. Pt has appt scheduled for tomorrow.

## 2024-08-28 NOTE — Telephone Encounter (Signed)
 I recommend if she is tolerating the WelChol  she does stay on the medication.  Even though we prescribed it for her diarrhea, she will receive benefit with it helping lower cholesterol and her A1c for diabetes  The only other options to add on is a powdered substance that she would mix in water to drink or a pill she can take before bed at night.  If she would like to try either of these I would be happy to call them in for her, she would need to follow-up 4 weeks after starting a new medication.

## 2024-08-28 NOTE — Telephone Encounter (Addendum)
 FYI Only or Action Required?: FYI only for provider.  Patient was last seen in primary care on 08/05/2024 by [name], DO.  Called Nurse Triage reporting Diarrhea.  Symptoms began Ongoing x 1 year.  Interventions attempted: Rest, hydration, or home remedies.  Symptoms are: gradually worsening.  Triage Disposition: See PCP Within 2 Weeks  Patient/caregiver understands and will follow disposition?: Yes  **appt. Scheduled for 9/19 to follow up wit PCP**            Reason for Disposition  Diarrhea is a chronic symptom (recurrent or ongoing AND present > 4 weeks)  Answer Assessment - Initial Assessment Questions 1. DIARRHEA SEVERITY: How bad is the diarrhea? How many more stools have you had in the past 24 hours than normal?      X  3 times   2. ONSET: When did the diarrhea begin?       Ongoing x 1 year, she states its becoming worse  3. STOOL DESCRIPTION:  How loose or watery is the diarrhea? What is the stool color? Is there any blood or mucous in the stool?     Watery   4. VOMITING: Are you also vomiting? If Yes, ask: How many times in the past 24 hours?      No   5. ABDOMEN PAIN: Are you having any abdomen pain? If Yes, ask: What does it feel like? (e.g., crampy, dull, intermittent, constant)      No   6. ABDOMEN PAIN SEVERITY: If present, ask: How bad is the pain?  (e.g., Scale 1-10; mild, moderate, or severe)     N/A  7. ORAL INTAKE: If vomiting, Have you been able to drink liquids? How much liquids have you had in the past 24 hours?     No vomiting   8. HYDRATION: Any signs of dehydration? (e.g., dry mouth [not just dry lips], too weak to stand, dizziness, new weight loss) When did you last urinate?     No    10. ANTIBIOTIC USE: Are you taking antibiotics now or have you taken antibiotics in the past 2 months?       No   11. OTHER SYMPTOMS: Do you have any other symptoms? (e.g., fever, blood in stool) No    Patient reports a chronic diarrhea ongoing x 1 year. Would like to follow up with provider. She stated PCP advised if symptoms persist to let her know. She is being seen by GI provider; no significant findings.  Protocols used: [facility]

## 2024-08-29 ENCOUNTER — Encounter: Payer: Self-pay | Admitting: Family Medicine

## 2024-08-29 ENCOUNTER — Ambulatory Visit: Payer: Self-pay | Admitting: Family Medicine

## 2024-08-29 ENCOUNTER — Ambulatory Visit (INDEPENDENT_AMBULATORY_CARE_PROVIDER_SITE_OTHER): Admitting: Family Medicine

## 2024-08-29 VITALS — BP 120/80 | HR 82 | Temp 97.9°F | Wt 239.0 lb

## 2024-08-29 DIAGNOSIS — R634 Abnormal weight loss: Secondary | ICD-10-CM

## 2024-08-29 DIAGNOSIS — R197 Diarrhea, unspecified: Secondary | ICD-10-CM

## 2024-08-29 LAB — SEDIMENTATION RATE: Sed Rate: 14 mm/h (ref 0–30)

## 2024-08-29 LAB — CBC WITH DIFFERENTIAL/PLATELET
Basophils Absolute: 0.1 K/uL (ref 0.0–0.1)
Basophils Relative: 0.6 % (ref 0.0–3.0)
Eosinophils Absolute: 0.1 K/uL (ref 0.0–0.7)
Eosinophils Relative: 1.5 % (ref 0.0–5.0)
HCT: 40.8 % (ref 36.0–46.0)
Hemoglobin: 13.7 g/dL (ref 12.0–15.0)
Lymphocytes Relative: 15.6 % (ref 12.0–46.0)
Lymphs Abs: 1.3 K/uL (ref 0.7–4.0)
MCHC: 33.6 g/dL (ref 30.0–36.0)
MCV: 93.2 fl (ref 78.0–100.0)
Monocytes Absolute: 0.4 K/uL (ref 0.1–1.0)
Monocytes Relative: 5.3 % (ref 3.0–12.0)
Neutro Abs: 6.5 K/uL (ref 1.4–7.7)
Neutrophils Relative %: 77 % (ref 43.0–77.0)
Platelets: 155 K/uL (ref 150.0–400.0)
RBC: 4.38 Mil/uL (ref 3.87–5.11)
RDW: 14.9 % (ref 11.5–15.5)
WBC: 8.4 K/uL (ref 4.0–10.5)

## 2024-08-29 LAB — C-REACTIVE PROTEIN: CRP: 1 mg/dL (ref 0.5–20.0)

## 2024-08-29 MED ORDER — AMITRIPTYLINE HCL 25 MG PO TABS: 25.0000 mg | ORAL_TABLET | Freq: Every day | ORAL | 1 refills | Status: DC

## 2024-08-29 NOTE — Progress Notes (Signed)
 Patient ID: Ann [name], female  DOB: 15-Dec-1950, 73 y.o.   MRN: [id number] Patient Care Team    Relationship Specialty Notifications Start End  Catherine Charlies LABOR, DO PCP - General Family Medicine  07/18/23   Ladona Heinz, MD PCP - Cardiology Cardiology  02/01/24   Ladona Heinz, MD Consulting Physician Cardiology  04/20/20   Ivin Kocher, MD Referring Physician Dermatology  04/20/20   Elspeth Lauraine DEL, OD Referring Physician   04/20/20   Levorn Lenis, MD Referring Physician Orthotics  04/20/20   Sharman Doffing, MD Referring Physician Obstetrics and Gynecology  04/20/20   Lucio Elsie BROCKS, MD Referring Physician Gastroenterology  04/20/20   Shamleffer, Donell Cardinal, MD Attending Physician Endocrinology  01/31/23     Chief Complaint  Patient presents with   Diarrhea    Pt no longer taking Welchol .     Subjective: Ann [name] is a 73 y.o.  female present for chronic diarrhea Medication reconciliation completed today Past medical history updated where appropriate today.  Diarrhea: Patient reports she is continue to have diarrhea since July 2024.  She has been seen by her GI doctors who did not feel additional workup was required.  She states she has daily yellowish colored diarrhea.  She is unable to go out to eat secondary to fecal urgency after eating.  Her diabetic medications have been ruled out as cause. She has unintentionally lost 60 pounds over the last 1.5 years. Last appointment we tried to start WelChol  to see if it would help with her diarrhea, she reports it did not help at all and caused her to be gassy.  Therefore she did stop the medication. She does not consume alcohol      07/16/2024    8:50 AM 02/22/2024    1:19 PM 07/11/2023    9:10 AM 07/09/2023   11:39 AM 04/04/2023    3:51 PM  Depression screen PHQ 2/9  Decreased Interest 0 0 0 0 0  Down, Depressed, Hopeless 0 0 0 0 0  PHQ - 2 Score 0 0 0 0 0  Altered sleeping 0      Tired, decreased energy 0      Change in  appetite 0      Feeling bad or failure about yourself  0      Trouble concentrating 0      Moving slowly or fidgety/restless 0      Suicidal thoughts 0      PHQ-9 Score 0      Difficult doing work/chores Not difficult at all          02/14/2022   11:01 AM 09/12/2021   11:08 AM  GAD 7 : Generalized Anxiety Score  Nervous, Anxious, on Edge 0 1  Control/stop worrying 0 0  Worry too much - different things 0 0  Trouble relaxing 0 0  Restless 0 0  Easily annoyed or irritable 0 0  Afraid - awful might happen 0 0  Total GAD 7 Score 0 1          07/16/2024    8:45 AM 02/22/2024    1:19 PM 02/21/2024   11:09 AM 08/24/2023    8:02 AM 07/11/2023    9:12 AM  Fall Risk   Falls in the past year? 1 0 0 0 0  Number falls in past yr: 0 0 0 0 0  Injury with Fall? 0  0 0 0  Risk for fall due to :  Impaired vision;Impaired balance/gait  Follow up Falls evaluation completed;Education provided;Falls prevention discussed  Falls evaluation completed Falls evaluation completed Falls prevention discussed    Immunization History  Administered Date(s) Administered   Fluad Quad(high Dose 65+) 09/26/2021, 09/16/2022   Fluad Trivalent(High Dose 65+) 09/18/2023   Influenza-Unspecified 09/26/2019   Moderna SARS-COV2 Booster Vaccination 09/02/2021   Moderna Sars-Covid-2 Vaccination 01/22/2020, 02/20/2020, 10/20/2020   Pfizer(Comirnaty)Fall Seasonal Vaccine 12 years and older 10/02/2022, 10/04/2023   Pneumococcal Conjugate-13 10/12/2014   Pneumococcal Polysaccharide-23 10/21/2019   Tdap 08/04/2019    No results found.  Past Medical History:  Diagnosis Date   Arthritis    osteoarthritis- knees.   Atrial fibrillation (HCC)    Dr. Debby status post cardioversion chronic anticoagulation   Basal ganglia stroke (HCC) 07/24/2023   Carotid artery occlusion    Cellulitis of right lower extremity 12/30/2020   Chicken pox    Colon cancer (HCC) 2001   Radiation, chemo.  Adenocarcinoma of the colon.   Followed by Dr. Dallie   Complication of anesthesia    sensitive to narcoticsextra sleepy. Versed - no memory recall for 4 days postop   Diabetes mellitus without complication (HCC)    oral and Insulin    Diarrhea 07/09/2023   Diverticulosis 07/23/2023   Hepatitis    sub clinical case of Hepatitis in late 70'shas immunities   Hypertension    Hypothyroidism    Laceration of head 08/04/2019   8 cm laceration forehead/scalp after tripping when mowing her lawn.   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   Ovarian teratoma, right 09/2000   Oophorectomy   Sleep apnea    unable to tolreate cpap   Wisdom teeth extracted    Word finding difficulty 07/09/2023   Allergies  Allergen Reactions   Amoxicillin Shortness Of Breath, Swelling and Anaphylaxis    Has patient had a PCN reaction causing immediate rash, facial/tongue/throat swelling, SOB or lightheadedness with hypotension: Yes Has patient had a PCN reaction causing severe rash involving mucus membranes or skin necrosis: No Has patient had a PCN reaction that required hospitalization No Has patient had a PCN reaction occurring within the last 10 years: Yes If all of the above answers are NO, then may proceed with Cephalosporin use.  Has patient had a PCN reaction causing immediate rash, facial/tongue/throat swelling, SOB or lightheadedness with hypotension: Yes Has patient had a PCN reaction causing severe rash involving mucus membranes or skin necrosis: No Has patient had a PCN reaction that required hospitalization No Has patient had a PCN reaction occurring within the last 10 years: Yes If all of the above answers are NO, then may proceed with Cephalosporin use. Has patient had a PCN reaction causing immediate rash, facial/tongue/throat swelling, SOB or lightheadedness with hypotension: Yes Has patient had a PCN  reaction causing severe rash involving mucus membranes or skin necrosis: No Has patient had a PCN reaction that required hospitalization No Has patient had a PCN reaction occurring within the last 10 years: Yes If all of the above answers are NO, then may proceed with Cephalosporin use.    Ampicillin    Codeine Itching    Tolerates hydrocodone    Ezetimibe    Livalo [Pitavastatin]    Statins Other (See Comments)    Muscle skeletal side effects   Past Surgical History:  Procedure Laterality Date   ABDOMINAL HYSTERECTOMY  09/2000  Total hysterectomy and oophorectomy   ABDOMINAL WOUND DEHISCENCE Bilateral    multiple times / mesh use with multiple abdominal hernia repair   CARDIOVERSION  2014   Unsuccessful.  Patient reports cardiac ablation was not felt to be appropriate next step by cardiology.   CHOLECYSTECTOMY  04/1998   '98 -Lapraroscopic   COLON RESECTION  2001   colon cancer   FOOT SURGERY Bilateral    multiple hammer toes and bunionectomies   KNEE ARTHROSCOPY Left 08/2006   meniscectomy   MELANOMA EXCISION     near scapula   TOTAL KNEE ARTHROPLASTY Right 01/24/2016   Procedure: RIGHT TOTAL KNEE ARTHROPLASTY;  Surgeon: Dempsey Moan, MD;  Location: [facility];  Service: Orthopedics;  Laterality: Right;   Family History  Problem Relation Age of Onset   Arthritis Mother    Colon cancer Mother    Diabetes Mother    Atrial fibrillation Mother    Diabetes Father    Heart attack Father    Heart disease Father    Stroke Father    Hypertension Maternal Grandmother    Stroke Maternal Grandmother    Stroke Maternal Grandfather    Hypertension Paternal Grandmother    Stroke Paternal Grandmother    Diabetes Paternal Grandfather    Hypertension Paternal Grandfather    Stroke Paternal Grandfather    Diabetes Brother    Brain cancer Brother    Heart disease Brother    Hypertension Brother    Stroke Brother    Social History   Social History Narrative   Marital  status/children/pets: Single. G0P0.   Education/employment: Retired Charity fundraiser:      -smoke alarm in the home:Yes     - wears seatbelt: Yes     - Feels safe in their relationships: Yes   Right handed   Drinks caffeine prn   Lives alone   Two story home   retired    Allergies as of 08/29/2024       Reactions   Amoxicillin Shortness Of Breath, Swelling, Anaphylaxis   Has patient had a PCN reaction causing immediate rash, facial/tongue/throat swelling, SOB or lightheadedness with hypotension: Yes Has patient had a PCN reaction causing severe rash involving mucus membranes or skin necrosis: No Has patient had a PCN reaction that required hospitalization No Has patient had a PCN reaction occurring within the last 10 years: Yes If all of the above answers are NO, then may proceed with Cephalosporin use. Has patient had a PCN reaction causing immediate rash, facial/tongue/throat swelling, SOB or lightheadedness with hypotension: Yes Has patient had a PCN reaction causing severe rash involving mucus membranes or skin necrosis: No Has patient had a PCN reaction that required hospitalization No Has patient had a PCN reaction occurring within the last 10 years: Yes If all of the above answers are NO, then may proceed with Cephalosporin use. Has patient had a PCN reaction causing immediate rash, facial/tongue/throat swelling, SOB or lightheadedness with hypotension: Yes Has patient had a PCN reaction causing severe rash involving mucus membranes or skin necrosis: No Has patient had a PCN reaction that required hospitalization No Has patient had a PCN reaction occurring within the last 10 years: Yes If all of the above answers are NO, then may proceed with Cephalosporin use.   Ampicillin    Codeine Itching   Tolerates hydrocodone    Ezetimibe    Livalo [pitavastatin]    Statins Other (See Comments)   Muscle skeletal side effects  Medication  List        Accurate as of August 29, 2024  3:49 PM. If you have any questions, ask your nurse or doctor.          STOP taking these medications    colesevelam  625 MG tablet Commonly known as: Welchol  Stopped by: Charlies Bellini       TAKE these medications    allopurinol  100 MG tablet Commonly known as: ZYLOPRIM  Take 1 tablet (100 mg total) by mouth daily.   ALPRAZolam  0.25 MG tablet Commonly known as: XANAX  Take 1 tablet (0.25 mg total) by mouth daily as needed for anxiety.   amitriptyline  25 MG tablet Commonly known as: ELAVIL  Take 1-3 tablets (25-75 mg total) by mouth at bedtime. Started by: Charlies Bellini   Basaglar  KwikPen 100 UNIT/ML Inject 46 Units into the skin every morning.   colchicine  0.6 MG tablet 2 tabs at onset of flare, followed by 1 tab 2 hours later. Then continue 1 tab daily until flare is resolved.   cyanocobalamin  1000 MCG/ML injection Commonly known as: VITAMIN B12 Inject 1 mL (1,000 mcg total) into the muscle every 14 (fourteen) days.   dabigatran  150 MG Caps capsule Commonly known as: PRADAXA  Take 1 capsule (150 mg total) by mouth 2 (two) times daily.   Dexcom G6 Transmitter Misc USE AS DIRECTED **DX.E11.69   Dexcom G7 Sensor Misc 1 DEVICE BY DOES NOT APPLY ROUTE AS DIRECTED.   EpiPen 2-Pak 0.3 MG/0.3ML Soaj injection Generic drug: EPINEPHrine Inject 0.3 mg into the skin as needed. Bee stings   Insulin  Pen Needle 30G X 6 MM Misc by Does not apply route.   Insulin  Pen Needle 29G X Misc Use to inject insulin    Insulin  Pen Needle 31G X 6 MM Misc Use to inject insulin  up to 5 times a day   Insulin  Pen Needle 29G X 12.7MM Misc Use to inject insulin  5 times daily   BD Pen Needle Micro U/F 32G X 6 MM Misc Generic drug: Insulin  Pen Needle Use to inject insulin  5 times daily.   levothyroxine  50 MCG tablet Commonly known as: SYNTHROID  Take 1 tablet (50 mcg total) by mouth daily before breakfast.   metoprolol  tartrate 50 MG  tablet Commonly known as: LOPRESSOR  Take 1 tablet (50 mg total) by mouth 2 (two) times daily.   NovoLOG  FlexPen 100 UNIT/ML FlexPen Generic drug: insulin  aspart Max daily 60 units   olmesartan -hydrochlorothiazide  40-25 MG tablet Commonly known as: BENICAR  HCT Take 1 tablet by mouth daily.   OneTouch Ultra test strip Generic drug: glucose blood SMARTSIG:Via Meter   PRESCRIPTION MEDICATION Inject 50,000 mcg as directed every 14 (fourteen) days. Vitamin B12 injections   tirzepatide  5 MG/0.5ML Pen Commonly known as: MOUNJARO  Inject 5 mg into the skin once a week.   triamcinolone  ointment 0.5 % Commonly known as: KENALOG  Apply topically 2 (two) times daily as needed.   Vitamin D  (Ergocalciferol ) 1.25 MG (50000 UNIT) Caps capsule Commonly known as: DRISDOL  TAKE 1 CAPSULE BY MOUTH ON MONDAY AND THURSDAY WITH FOOD   zolpidem  5 MG tablet Commonly known as: AMBIEN  Take 1 tablet (5 mg total) by mouth at bedtime as needed.        All past medical history, surgical history, allergies, family history, immunizations andmedications were updated in the EMR today and reviewed under the history and medication portions of their EMR.     ROS: 14 pt review of systems performed and negative (unless mentioned in an HPI)  Objective: BP 120/80   Pulse 82   Temp 97.9 F (36.6 C)   Wt 239 lb (108.4 kg)   SpO2 97%   BMI 36.34 kg/m  Physical Exam Vitals and nursing note reviewed.  Constitutional:      General: She is not in acute distress.    Appearance: Normal appearance. She is obese. She is not ill-appearing, toxic-appearing or diaphoretic.  HENT:     Head: Normocephalic and atraumatic.  Eyes:     General: No scleral icterus.       Right eye: No discharge.        Left eye: No discharge.     Extraocular Movements: Extraocular movements intact.     Conjunctiva/sclera: Conjunctivae normal.     Pupils: Pupils are equal, round, and reactive to light.  Cardiovascular:     Rate and  Rhythm: Normal rate. Rhythm irregular.     Heart sounds: No murmur heard. Pulmonary:     Effort: Pulmonary effort is normal. No respiratory distress.     Breath sounds: Normal breath sounds. No wheezing, rhonchi or rales.  Abdominal:     General: Bowel sounds are normal. There is no distension.     Palpations: Abdomen is soft. There is no mass.     Tenderness: There is no abdominal tenderness. There is no guarding or rebound.  Musculoskeletal:     Right lower leg: No edema.     Left lower leg: No edema.  Skin:    General: Skin is warm.     Coloration: Skin is not pale.     Findings: No rash.  Neurological:     Mental Status: She is alert and oriented to person, place, and time. Mental status is at baseline.     Motor: No weakness.     Gait: Gait normal.  Psychiatric:        Mood and Affect: Mood normal.        Behavior: Behavior normal.        Thought Content: Thought content normal.        Judgment: Judgment normal.     Assessment/plan: Ann [name] is a 73 y.o. female present for  Chronic  diarrhea/unintentional weight loss: Patient continues to have diarrhea since July 2024 with GI workup completed without cause. Unintentional weight loss of approximately 60 pounds in 1.5 years Diabetic medications have been ruled out as cause. Start Elavil  25-75 mg nightly with tapering instructions GI path panel and C. difficile stool studies ordered Fecal WBC and fecal lactase ordered CBC, CRP and sed rate ordered  Return if symptoms worsen or fail to improve.  Orders Placed This Encounter  Procedures   Gastrointestinal Panel by PCR , Stool   Clostridium Difficile by PCR([facility])   Stool, WBC/Lactoferrin   Pancreatic Elastase, Fecal   C-reactive protein   Sedimentation rate   CBC w/Diff    Meds ordered this encounter  Medications   amitriptyline  (ELAVIL ) 25 MG tablet    Sig: Take 1-3 tablets (25-75 mg total) by mouth at bedtime.    Dispense:  90 tablet     Refill:  1    Referral Orders  No referral(s) requested today      Note is dictated utilizing voice recognition software. Although note has been proof read prior to signing, occasional typographical errors still can be missed. If any questions arise, please do not hesitate to call for verification.  Electronically signed by: Charlies Bellini, DO [facility] [place]

## 2024-08-29 NOTE — Patient Instructions (Signed)

## 2024-09-02 ENCOUNTER — Ambulatory Visit: Admitting: Internal Medicine

## 2024-09-02 NOTE — Progress Notes (Deleted)
 Name: Ann [name]  Age/ Sex: 73 y.o., female   MRN/ DOB: [id number], 1951-07-13     PCP: Catherine Charlies LABOR, DO   Reason for Endocrinology Evaluation: Type 2 Diabetes Mellitus  Initial Endocrine Consultative Visit: 09/22/2020    PATIENT IDENTIFIER: Ann [name] is a 73 y.o. female with a past medical history of T2Dm, A. Fib ,HTn and dyslipidemia, Has hx of colon cancer  . The patient has followed with Endocrinology clinic since 2021 for consultative assistance with management of her diabetes.  DIABETIC HISTORY:  [name] was diagnosed with DM in 2013. Intolerant to Metformin .Doreen and invokana caused genital irritation.Trulicity  -caused urinary retention . Her hemoglobin A1c has ranged from 7.6% in 2022, peaking at 8.8% in 2022.    She had a normal thyroid  ultrasound in May 2023, which was prompted by thyromegaly on physical exam.  She was followed up by Dr. Kassie from 2021 until March 2023   Started Ozempic  07/2023 with an A1c of 9.5% , this was discontinued due to cost in September, 2025 with an A1c of 7.0%   SUBJECTIVE:   During the last visit (05/02/2024): SABRA A1c 7.0%     Today (09/02/2024): Ann [name] is here for a follow up on diabetes management .She checks her blood sugars multiple times a day through CGM. The patient has had one episode of hypoglycemic episodes since the last clinic visit, she is symptomatic with these episodes  Patient continues to follow-up with neurology for expressive aphasia following basal ganglia and subacute stroke 07/2023 She continues to follow-up with cardiology for carotid stenosis, dyslipidemia and A-fib She was evaluated by her PCP for chronic diarrhea in September, 2025 workup by GI was unrevealing.  She was started on Elavil    HOME DIABETES REGIMEN:  Basaglar  46 units daily  Novolog  10 units TIDQAC CF : Novolog  (BG-130/15) TIDQAC     Statin: yes ACE-I/ARB: yes    CONTINUOUS GLUCOSE MONITORING RECORD  INTERPRETATION    Dates of Recording: 5/10-5/23/2025  Sensor description: dexcom  Results statistics:   CGM use % of time 85  Average and SD 194/57  Time in range     40   %  % Time Above 180 45  % Time above 250 15  % Time Below target 0   Glycemic patterns summary: BGs are optimal overnight and fluctuate during the day  Hyperglycemic episodes postprandial  Hypoglycemic episodes occurred N/A  Overnight periods: Optimal       DIABETIC COMPLICATIONS: Microvascular complications:  Denies:  Last Eye Exam: Completed 2023- schedule 05/2024  Macrovascular complications:  CVA 07/2023 Denies: CAD, PVD   HISTORY:  Past Medical History:  Past Medical History:  Diagnosis Date   Arthritis    osteoarthritis- knees.   Atrial fibrillation (HCC)    Dr. Debby status post cardioversion chronic anticoagulation   Basal ganglia stroke (HCC) 07/24/2023   Carotid artery occlusion    Cellulitis of right lower extremity 12/30/2020   Chicken pox    Colon cancer (HCC) 2001   Radiation, chemo.  Adenocarcinoma of the colon.  Followed by Dr. Dallie   Complication of anesthesia    sensitive to narcoticsextra sleepy. Versed - no memory recall for 4 days postop   Diabetes mellitus without complication (HCC)    oral and Insulin    Diarrhea 07/09/2023   Diverticulosis 07/23/2023   Hepatitis    sub clinical case of Hepatitis in late 70'shas immunities   Hypertension    Hypothyroidism    Laceration  of head 08/04/2019   8 cm laceration forehead/scalp after tripping when mowing her lawn.   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   Ovarian teratoma, right 09/2000   Oophorectomy   Sleep apnea    unable to tolreate cpap   Wisdom teeth extracted    Word finding difficulty 07/09/2023   Past Surgical History:  Past Surgical History:  Procedure Laterality Date   ABDOMINAL  HYSTERECTOMY  09/2000    Total hysterectomy and oophorectomy   ABDOMINAL WOUND DEHISCENCE Bilateral    multiple times / mesh use with multiple abdominal hernia repair   CARDIOVERSION  2014   Unsuccessful.  Patient reports cardiac ablation was not felt to be appropriate next step by cardiology.   CHOLECYSTECTOMY  04/1998   '98 -Lapraroscopic   COLON RESECTION  2001   colon cancer   FOOT SURGERY Bilateral    multiple hammer toes and bunionectomies   KNEE ARTHROSCOPY Left 08/2006   meniscectomy   MELANOMA EXCISION     near scapula   TOTAL KNEE ARTHROPLASTY Right 01/24/2016   Procedure: RIGHT TOTAL KNEE ARTHROPLASTY;  Surgeon: Dempsey Moan, MD;  Location: [facility];  Service: Orthopedics;  Laterality: Right;   Social History:  reports that she quit smoking about 27 years ago. Her smoking use included cigarettes. She started smoking about 52 years ago. She has never used smokeless tobacco. She reports that she does not drink alcohol and does not use drugs. Family History:  Family History  Problem Relation Age of Onset   Arthritis Mother    Colon cancer Mother    Diabetes Mother    Atrial fibrillation Mother    Diabetes Father    Heart attack Father    Heart disease Father    Stroke Father    Hypertension Maternal Grandmother    Stroke Maternal Grandmother    Stroke Maternal Grandfather    Hypertension Paternal Grandmother    Stroke Paternal Grandmother    Diabetes Paternal Grandfather    Hypertension Paternal Grandfather    Stroke Paternal Grandfather    Diabetes Brother    Brain cancer Brother    Heart disease Brother    Hypertension Brother    Stroke Brother      HOME MEDICATIONS: Allergies as of 09/02/2024       Reactions   Amoxicillin Shortness Of Breath, Swelling, Anaphylaxis   Has patient had a PCN reaction causing immediate rash, facial/tongue/throat swelling, SOB or lightheadedness with hypotension: Yes Has patient had a PCN reaction causing severe rash  involving mucus membranes or skin necrosis: No Has patient had a PCN reaction that required hospitalization No Has patient had a PCN reaction occurring within the last 10 years: Yes If all of the above answers are NO, then may proceed with Cephalosporin use. Has patient had a PCN reaction causing immediate rash, facial/tongue/throat swelling, SOB or lightheadedness with hypotension: Yes Has patient had a PCN reaction causing severe rash involving mucus membranes or skin necrosis: No Has patient had a PCN reaction that required hospitalization No Has patient had a PCN reaction occurring within the last 10 years: Yes If all of the above answers are NO, then may proceed with Cephalosporin use. Has patient had a PCN reaction causing immediate rash, facial/tongue/throat swelling, SOB or lightheadedness with hypotension: Yes Has patient had a PCN reaction causing severe rash involving mucus membranes or  skin necrosis: No Has patient had a PCN reaction that required hospitalization No Has patient had a PCN reaction occurring within the last 10 years: Yes If all of the above answers are NO, then may proceed with Cephalosporin use.   Ampicillin    Codeine Itching   Tolerates hydrocodone    Ezetimibe    Livalo [pitavastatin]    Statins Other (See Comments)   Muscle skeletal side effects        Medication List        Accurate as of September 02, 2024  7:28 AM. If you have any questions, ask your nurse or doctor.          allopurinol  100 MG tablet Commonly known as: ZYLOPRIM  Take 1 tablet (100 mg total) by mouth daily.   ALPRAZolam  0.25 MG tablet Commonly known as: XANAX  Take 1 tablet (0.25 mg total) by mouth daily as needed for anxiety.   amitriptyline  25 MG tablet Commonly known as: ELAVIL  Take 1-3 tablets (25-75 mg total) by mouth at bedtime.   Basaglar  KwikPen 100 UNIT/ML Inject 46 Units into the skin every morning.   colchicine  0.6 MG tablet 2 tabs at onset of flare,  followed by 1 tab 2 hours later. Then continue 1 tab daily until flare is resolved.   cyanocobalamin  1000 MCG/ML injection Commonly known as: VITAMIN B12 Inject 1 mL (1,000 mcg total) into the muscle every 14 (fourteen) days.   dabigatran  150 MG Caps capsule Commonly known as: PRADAXA  Take 1 capsule (150 mg total) by mouth 2 (two) times daily.   Dexcom G6 Transmitter Misc USE AS DIRECTED **DX.E11.69   Dexcom G7 Sensor Misc 1 DEVICE BY DOES NOT APPLY ROUTE AS DIRECTED.   EpiPen 2-Pak 0.3 MG/0.3ML Soaj injection Generic drug: EPINEPHrine Inject 0.3 mg into the skin as needed. Bee stings   Insulin  Pen Needle 30G X 6 MM Misc by Does not apply route.   Insulin  Pen Needle 29G X Misc Use to inject insulin    Insulin  Pen Needle 31G X 6 MM Misc Use to inject insulin  up to 5 times a day   Insulin  Pen Needle 29G X 12.7MM Misc Use to inject insulin  5 times daily   BD Pen Needle Micro U/F 32G X 6 MM Misc Generic drug: Insulin  Pen Needle Use to inject insulin  5 times daily.   levothyroxine  50 MCG tablet Commonly known as: SYNTHROID  Take 1 tablet (50 mcg total) by mouth daily before breakfast.   metoprolol  tartrate 50 MG tablet Commonly known as: LOPRESSOR  Take 1 tablet (50 mg total) by mouth 2 (two) times daily.   NovoLOG  FlexPen 100 UNIT/ML FlexPen Generic drug: insulin  aspart Max daily 60 units   olmesartan -hydrochlorothiazide  40-25 MG tablet Commonly known as: BENICAR  HCT Take 1 tablet by mouth daily.   OneTouch Ultra test strip Generic drug: glucose blood SMARTSIG:Via Meter   PRESCRIPTION MEDICATION Inject 50,000 mcg as directed every 14 (fourteen) days. Vitamin B12 injections   tirzepatide  5 MG/0.5ML Pen Commonly known as: MOUNJARO  Inject 5 mg into the skin once a week.   triamcinolone  ointment 0.5 % Commonly known as: KENALOG  Apply topically 2 (two) times daily as needed.   Vitamin D  (Ergocalciferol ) 1.25 MG (50000 UNIT) Caps capsule Commonly known as:  DRISDOL  TAKE 1 CAPSULE BY MOUTH ON MONDAY AND THURSDAY WITH FOOD   zolpidem  5 MG tablet Commonly known as: AMBIEN  Take 1 tablet (5 mg total) by mouth at bedtime as needed.         OBJECTIVE:  Vital Signs: There were no vitals taken for this visit.  Wt Readings from Last 3 Encounters:  08/29/24 239 lb (108.4 kg)  08/05/24 247 lb 6.4 oz (112.2 kg)  07/16/24 251 lb (113.9 kg)     Exam: General: Pt appears well and is in NAD  Lungs: Clear with good BS bilat   Heart: RRR   Extremities: Non pitting  pretibial edema.   Neuro: MS is good with appropriate affect, pt is alert and Ox3   DM Foot Exam 07/27/2023 The skin of the feet is  without sores or ulcerations, pt noted with discoloration of the 1st and 2nd right toes, cold to the touch  The pedal pulses are 1+ on right and 1+ on left. The sensation is intact to a screening 5.07, 10 gram monofilament bilaterally        DATA REVIEWED:  Lab Results  Component Value Date   HGBA1C 7.0 (A) 05/02/2024   HGBA1C 7.7 (A) 01/02/2024   HGBA1C 9.5 (H) 07/18/2023    Latest Reference Range & Units 08/05/24 09:54  Sodium 135 - 145 mEq/L 140  Potassium 3.5 - 5.1 mEq/L 3.6  Chloride 96 - 112 mEq/L 103  CO2 19 - 32 mEq/L 24  Glucose 70 - 99 mg/dL 882 (H)  BUN 6 - 23 mg/dL 33 (H)  Creatinine 9.59 - 1.20 mg/dL 8.86  Calcium 8.4 - 89.4 mg/dL 8.6  Alkaline Phosphatase 39 - 117 U/L 95  Albumin 3.5 - 5.2 g/dL 3.8  AST 0 - 37 U/L 20  ALT 0 - 35 U/L 20  Total Protein 6.0 - 8.3 g/dL 6.6  Total Bilirubin 0.2 - 1.2 mg/dL 0.9  GFR >39.99 mL/min 47.74 (L)     Old records , labs and images have been reviewed.     ASSESSMENT / PLAN / RECOMMENDATIONS:   1) Type 2 Diabetes Mellitus, Optimally  controlled, without complications - Most recent A1c of 7.0 %. Goal A1c < 7.0 %.    -A1c is optimal -In office BG 154 Mg/DL -Intolerant to metformin  and SGLT2 inhibitors - She does have changes in stool consistency, this is not attributed to  Ozempic , Ozempic  has been cost prohibitive at > $700 .  She does not qualify for patient assistance either.  So we discussed that she will do without it at this time and continue to check glucose with the Dexcom and take insulin  as prescribed  MEDICATIONS: Continue  Basaglar  to 46 units daily Take NovoLog  10 units before each meal  Correction scale: Novolog  (BG-130/15) TIDQAC   EDUCATION / INSTRUCTIONS: BG monitoring instructions: Patient is instructed to check her blood sugars 3 times a day, before meals . Call [facility] if: BG persistently < 70  I reviewed the Rule of 15 for the treatment of hypoglycemia in detail with the patient. Literature supplied.    2) Dyslipidemia :  - Per cardiology  - History of statin myopathy    F/U in 4 months       Signed electronically by: Stefano Redgie Butts, MD  [facility], [facility], [facility]  [facility] [address] [place], [place] [address] Phone: [phone] FAX: [phone]   CC: Catherine Charlies LABOR, DO [address] [place] [address] Phone: [phone]  Fax: [phone]  Return to Endocrinology clinic as below: Future Appointments  Date Time Provider Department Center  09/02/2024 10:50 AM Crimson Beer, Donell Redgie, MD [facility] None  11/25/2024 11:00 AM [facility] None  07/22/2025  8:50  AM [facility] ANNUAL WELLNESS VISIT [facility] [address]

## 2024-09-03 ENCOUNTER — Ambulatory Visit (INDEPENDENT_AMBULATORY_CARE_PROVIDER_SITE_OTHER): Admitting: Internal Medicine

## 2024-09-03 ENCOUNTER — Encounter: Payer: Self-pay | Admitting: Internal Medicine

## 2024-09-03 VITALS — BP 126/78 | HR 84 | Ht 68.0 in | Wt 238.0 lb

## 2024-09-03 DIAGNOSIS — Z794 Long term (current) use of insulin: Secondary | ICD-10-CM | POA: Diagnosis not present

## 2024-09-03 DIAGNOSIS — E1169 Type 2 diabetes mellitus with other specified complication: Secondary | ICD-10-CM

## 2024-09-03 DIAGNOSIS — E1165 Type 2 diabetes mellitus with hyperglycemia: Secondary | ICD-10-CM

## 2024-09-03 DIAGNOSIS — E785 Hyperlipidemia, unspecified: Secondary | ICD-10-CM | POA: Diagnosis not present

## 2024-09-03 LAB — POCT GLYCOSYLATED HEMOGLOBIN (HGB A1C): Hemoglobin A1C: 6.9 % — AB (ref 4.0–5.6)

## 2024-09-03 MED ORDER — NOVOLOG FLEXPEN 100 UNIT/ML ~~LOC~~ SOPN: PEN_INJECTOR | SUBCUTANEOUS | 3 refills | Status: DC

## 2024-09-03 MED ORDER — INSULIN PEN NEEDLE 30G X 6 MM MISC: 1.0000 | Freq: Four times a day (QID) | 3 refills | Status: DC

## 2024-09-03 MED ORDER — BASAGLAR KWIKPEN 100 UNIT/ML ~~LOC~~ SOPN: 46.0000 [IU] | PEN_INJECTOR | SUBCUTANEOUS | 3 refills | Status: DC

## 2024-09-03 NOTE — Progress Notes (Signed)
 Name: Ann [name]  Age/ Sex: 73 y.o., female   MRN/ DOB: [id number], 1951/06/17     PCP: Catherine Charlies LABOR, DO   Reason for Endocrinology Evaluation: Type 2 Diabetes Mellitus  Initial Endocrine Consultative Visit: 09/22/2020    PATIENT IDENTIFIER: Ann [name] is a 73 y.o. female with a past medical history of T2Dm, A. Fib ,HTn and dyslipidemia, Has hx of colon cancer  . The patient has followed with Endocrinology clinic since 2021 for consultative assistance with management of her diabetes.  DIABETIC HISTORY:  Ms. Pippenger was diagnosed with DM in 2013. Intolerant to Metformin .Farxiga and invokana caused genital irritation.Trulicity  -caused urinary retention . Her hemoglobin A1c has ranged from 7.6% in 2022, peaking at 8.8% in 2022.    She had a normal thyroid  ultrasound in May 2023, which was prompted by thyromegaly on physical exam.  She was followed up by Dr. Kassie from 2021 until March 2023   Started Ozempic  07/2023 with an A1c of 9.5% , this was discontinued due to cost in September, 2025 with an A1c of 7.0%   SUBJECTIVE:   During the last visit (05/02/2024): SABRA A1c 7.0%     Today (09/03/2024): [name] is here for a follow up on diabetes management .She checks her blood sugars multiple times a day through CGM. The patient has had one episode of hypoglycemic episodes since the last clinic visit, she is symptomatic with these episodes   She was recently evaluated by her PCPs office for chronic diarrhea and unintentional weight loss, started on Elavil  Patient continues to follow-up with neurology following basal ganglia subacute stroke which occurred 07/2023 She continues to follow-up with cardiology for carotid stenosis, dyslipidemia and A-fib Her appetite has slightly improved  Continues with diarrhea  No recent  nausea or vomiting     HOME DIABETES REGIMEN:  Basaglar  46 units daily  Novolog  10 units TIDQAC CF : Novolog  (BG-130/15) TIDQAC      Statin: yes ACE-I/ARB: yes    CONTINUOUS GLUCOSE MONITORING RECORD INTERPRETATION    Dates of Recording: 9/11-9/24/2025  Sensor description: dexcom  Results statistics:   CGM use % of time 64  Average and SD 159/33  Time in range 73  %  % Time Above 180 26  % Time above 250 1  % Time Below target 0   Glycemic patterns summary: BGs are optimal throughout the night and between the meals Hyperglycemic episodes postprandial  Hypoglycemic episodes occurred N/A  Overnight periods: Optimal       DIABETIC COMPLICATIONS: Microvascular complications:  Denies:  Last Eye Exam: Completed 06/11/2024  Macrovascular complications:  CVA 07/2023 Denies: CAD, PVD   HISTORY:  Past Medical History:  Past Medical History:  Diagnosis Date   Arthritis    osteoarthritis- knees.   Atrial fibrillation (HCC)    Dr. Debby status post cardioversion chronic anticoagulation   Basal ganglia stroke (HCC) 07/24/2023   Carotid artery occlusion    Cellulitis of right lower extremity 12/30/2020   Chicken pox    Colon cancer (HCC) 2001   Radiation, chemo.  Adenocarcinoma of the colon.  Followed by Dr. Dallie   Complication of anesthesia    sensitive to narcoticsextra sleepy. Versed - no memory recall for 4 days postop   Diabetes mellitus without complication (HCC)    oral and Insulin    Diarrhea 07/09/2023   Diverticulosis 07/23/2023   Hepatitis    sub clinical case of Hepatitis in late 70'shas immunities   Hypertension  Hypothyroidism    Laceration of head 08/04/2019   8 cm laceration forehead/scalp after tripping when mowing her lawn.   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   Ovarian teratoma, right 09/2000   Oophorectomy   Sleep apnea    unable to tolreate cpap   Wisdom teeth extracted    Word finding difficulty 07/09/2023   Past Surgical History:  Past  Surgical History:  Procedure Laterality Date   ABDOMINAL HYSTERECTOMY  09/2000    Total hysterectomy and oophorectomy   ABDOMINAL WOUND DEHISCENCE Bilateral    multiple times / mesh use with multiple abdominal hernia repair   CARDIOVERSION  2014   Unsuccessful.  Patient reports cardiac ablation was not felt to be appropriate next step by cardiology.   CHOLECYSTECTOMY  04/1998   '98 -Lapraroscopic   COLON RESECTION  2001   colon cancer   FOOT SURGERY Bilateral    multiple hammer toes and bunionectomies   KNEE ARTHROSCOPY Left 08/2006   meniscectomy   MELANOMA EXCISION     near scapula   TOTAL KNEE ARTHROPLASTY Right 01/24/2016   Procedure: RIGHT TOTAL KNEE ARTHROPLASTY;  Surgeon: Dempsey Moan, MD;  Location: [facility];  Service: Orthopedics;  Laterality: Right;   Social History:  reports that she quit smoking about 27 years ago. Her smoking use included cigarettes. She started smoking about 52 years ago. She has never used smokeless tobacco. She reports that she does not drink alcohol and does not use drugs. Family History:  Family History  Problem Relation Age of Onset   Arthritis Mother    Colon cancer Mother    Diabetes Mother    Atrial fibrillation Mother    Diabetes Father    Heart attack Father    Heart disease Father    Stroke Father    Hypertension Maternal Grandmother    Stroke Maternal Grandmother    Stroke Maternal Grandfather    Hypertension Paternal Grandmother    Stroke Paternal Grandmother    Diabetes Paternal Grandfather    Hypertension Paternal Grandfather    Stroke Paternal Grandfather    Diabetes Brother    Brain cancer Brother    Heart disease Brother    Hypertension Brother    Stroke Brother      HOME MEDICATIONS: Allergies as of 09/03/2024       Reactions   Amoxicillin Shortness Of Breath, Swelling, Anaphylaxis   Has patient had a PCN reaction causing immediate rash, facial/tongue/throat swelling, SOB or lightheadedness with hypotension:  Yes Has patient had a PCN reaction causing severe rash involving mucus membranes or skin necrosis: No Has patient had a PCN reaction that required hospitalization No Has patient had a PCN reaction occurring within the last 10 years: Yes If all of the above answers are NO, then may proceed with Cephalosporin use. Has patient had a PCN reaction causing immediate rash, facial/tongue/throat swelling, SOB or lightheadedness with hypotension: Yes Has patient had a PCN reaction causing severe rash involving mucus membranes or skin necrosis: No Has patient had a PCN reaction that required hospitalization No Has patient had a PCN reaction occurring within the last 10 years: Yes If all of the above answers are NO, then may proceed with Cephalosporin use. Has patient had a PCN reaction causing immediate rash, facial/tongue/throat swelling, SOB or lightheadedness with hypotension: Yes Has patient had a PCN reaction causing severe  rash involving mucus membranes or skin necrosis: No Has patient had a PCN reaction that required hospitalization No Has patient had a PCN reaction occurring within the last 10 years: Yes If all of the above answers are NO, then may proceed with Cephalosporin use.   Ampicillin    Codeine Itching   Tolerates hydrocodone    Ezetimibe    Livalo [pitavastatin]    Statins Other (See Comments)   Muscle skeletal side effects        Medication List        Accurate as of September 03, 2024  9:03 AM. If you have any questions, ask your nurse or doctor.          STOP taking these medications    tirzepatide  5 MG/0.5ML Pen Commonly known as: MOUNJARO  Stopped by: Donell PARAS Greidys Deland       TAKE these medications    allopurinol  100 MG tablet Commonly known as: ZYLOPRIM  Take 1 tablet (100 mg total) by mouth daily.   ALPRAZolam  0.25 MG tablet Commonly known as: XANAX  Take 1 tablet (0.25 mg total) by mouth daily as needed for anxiety.   amitriptyline  25 MG  tablet Commonly known as: ELAVIL  Take 1-3 tablets (25-75 mg total) by mouth at bedtime.   Basaglar  KwikPen 100 UNIT/ML Inject 46 Units into the skin every morning.   colchicine  0.6 MG tablet 2 tabs at onset of flare, followed by 1 tab 2 hours later. Then continue 1 tab daily until flare is resolved.   cyanocobalamin  1000 MCG/ML injection Commonly known as: VITAMIN B12 Inject 1 mL (1,000 mcg total) into the muscle every 14 (fourteen) days.   dabigatran  150 MG Caps capsule Commonly known as: PRADAXA  Take 1 capsule (150 mg total) by mouth 2 (two) times daily.   Dexcom G6 Transmitter Misc USE AS DIRECTED **DX.E11.69   Dexcom G7 Sensor Misc 1 DEVICE BY DOES NOT APPLY ROUTE AS DIRECTED.   EpiPen 2-Pak 0.3 MG/0.3ML Soaj injection Generic drug: EPINEPHrine Inject 0.3 mg into the skin as needed. Bee stings   Insulin  Pen Needle 30G X 6 MM Misc by Does not apply route. What changed: Another medication with the same name was removed. Continue taking this medication, and follow the directions you see here. Changed by: Donell PARAS Laurianne Floresca   levothyroxine  50 MCG tablet Commonly known as: SYNTHROID  Take 1 tablet (50 mcg total) by mouth daily before breakfast.   metoprolol  tartrate 50 MG tablet Commonly known as: LOPRESSOR  Take 1 tablet (50 mg total) by mouth 2 (two) times daily.   NovoLOG  FlexPen 100 UNIT/ML FlexPen Generic drug: insulin  aspart Max daily 60 units   olmesartan -hydrochlorothiazide  40-25 MG tablet Commonly known as: BENICAR  HCT Take 1 tablet by mouth daily.   OneTouch Ultra test strip Generic drug: glucose blood SMARTSIG:Via Meter   PRESCRIPTION MEDICATION Inject 50,000 mcg as directed every 14 (fourteen) days. Vitamin B12 injections   triamcinolone  ointment 0.5 % Commonly known as: KENALOG  Apply topically 2 (two) times daily as needed.   Vitamin D  (Ergocalciferol ) 1.25 MG (50000 UNIT) Caps capsule Commonly known as: DRISDOL  TAKE 1 CAPSULE BY MOUTH ON  MONDAY AND THURSDAY WITH FOOD   zolpidem  5 MG tablet Commonly known as: AMBIEN  Take 1 tablet (5 mg total) by mouth at bedtime as needed.         OBJECTIVE:   Vital Signs: BP 126/78 (BP Location: Left Arm, Patient Position: Sitting, Cuff Size: Large)   Pulse 84   Ht 5' 8 (1.727 m)  Wt 238 lb (108 kg)   SpO2 98%   BMI 36.19 kg/m   Wt Readings from Last 3 Encounters:  09/03/24 238 lb (108 kg)  08/29/24 239 lb (108.4 kg)  08/05/24 247 lb 6.4 oz (112.2 kg)     Exam: General: Pt appears well and is in NAD  Lungs: Clear with good BS bilat   Heart: RRR   Extremities: Non pitting  pretibial edema.   Neuro: MS is good with appropriate affect, pt is alert and Ox3   DM Foot Exam 09/03/2024 The skin of the feet is  without sores or ulcerations The pedal pulses are 1+ on right and 1+ on left. The sensation is intact to a screening 5.07, 10 gram monofilament bilaterally        DATA REVIEWED:  Lab Results  Component Value Date   HGBA1C 6.9 (A) 09/03/2024   HGBA1C 7.0 (A) 05/02/2024   HGBA1C 7.7 (A) 01/02/2024    Latest Reference Range & Units 08/05/24 09:54  Sodium 135 - 145 mEq/L 140  Potassium 3.5 - 5.1 mEq/L 3.6  Chloride 96 - 112 mEq/L 103  CO2 19 - 32 mEq/L 24  Glucose 70 - 99 mg/dL 882 (H)  BUN 6 - 23 mg/dL 33 (H)  Creatinine 9.59 - 1.20 mg/dL 8.86  Calcium 8.4 - 89.4 mg/dL 8.6  Alkaline Phosphatase 39 - 117 U/L 95  Albumin 3.5 - 5.2 g/dL 3.8  AST 0 - 37 U/L 20  ALT 0 - 35 U/L 20  Total Protein 6.0 - 8.3 g/dL 6.6  Total Bilirubin 0.2 - 1.2 mg/dL 0.9  GFR >39.99 mL/min 47.74 (L)     Old records , labs and images have been reviewed.     ASSESSMENT / PLAN / RECOMMENDATIONS:   1) Type 2 Diabetes Mellitus, Optimally  controlled, without complications - Most recent A1c of 6.9 %. Goal A1c < 7.0 %.    -A1c is optimal -Intolerant to metformin  and SGLT2 inhibitors - GLP-1 agonist have been cost prohibitive, patient also has chronic diarrhea that is not  attributed to Ozempic  use in the past -No changes  MEDICATIONS: Continue  Basaglar  to 46 units daily Take NovoLog  10 units before each meal  Correction scale: Novolog  (BG-130/15) TIDQAC   EDUCATION / INSTRUCTIONS: BG monitoring instructions: Patient is instructed to check her blood sugars 3 times a day, before meals . Call [facility] if: BG persistently < 70  I reviewed the Rule of 15 for the treatment of hypoglycemia in detail with the patient. Literature supplied.    2) Dyslipidemia :  - Per cardiology  - History of statin myopathy    F/U in 6 months       Signed electronically by: Stefano Redgie Butts, MD  [facility]  [facility] [address] [place] [place], [place] [address] Phone: [phone] FAX: [phone]   CC: Catherine Charlies LABOR, DO [address] [place] [address] Phone: [phone]  Fax: [phone]  Return to Endocrinology clinic as below: Future Appointments  Date Time Provider Department Center  09/03/2024  9:10 AM Marlise Fahr, Donell Redgie, MD [facility] None  11/25/2024 11:00 AM [facility] None  07/22/2025  8:50 AM [facility] ANNUAL WELLNESS VISIT [facility] [address]

## 2024-09-03 NOTE — Patient Instructions (Signed)
-   Basaglar  46 units daily  - Novolog  10 units with each meal  - Novolog  correctional insulin : ADD extra units on insulin  to your meal-time Novolog  dose if your blood sugars are higher than 145. Use the scale below to help guide you:   Blood sugar before meal Number of units to inject  145 0 unit  146 - 160 1 units  161 - 175 2 units  176 - 190 3 units  191 - 205 4 units  206 - 220 5 units  221 - 235 6 units  236 - 250 7 units  251 - 265 8 units  266 - 280 9 units   281- 295 10 units      HOW TO TREAT LOW BLOOD SUGARS (Blood sugar LESS THAN 70 MG/DL) Please follow the RULE OF 15 for the treatment of hypoglycemia treatment (when your (blood sugars are less than 70 mg/dL)   STEP 1: Take 15 grams of carbohydrates when your blood sugar is low, which includes:  3-4 GLUCOSE TABS  OR 3-4 OZ OF JUICE OR REGULAR SODA OR ONE TUBE OF GLUCOSE GEL    STEP 2: RECHECK blood sugar in 15 MINUTES STEP 3: If your blood sugar is still low at the 15 minute recheck --> then, go back to STEP 1 and treat AGAIN with another 15 grams of carbohydrates.

## 2024-09-04 ENCOUNTER — Other Ambulatory Visit (HOSPITAL_COMMUNITY): Payer: Self-pay

## 2024-09-04 ENCOUNTER — Telehealth: Payer: Self-pay

## 2024-09-04 ENCOUNTER — Encounter: Payer: Self-pay | Admitting: Internal Medicine

## 2024-09-04 NOTE — Telephone Encounter (Signed)
 Pharmacy Patient Advocate Encounter   Received notification from Onbase that prior authorization for Amitriptyline  HCl 25MG  tablets s required/requested.   Insurance verification completed.   The patient is insured through [facility] .   Per test claim: PA required; PA submitted to above mentioned insurance via Latent Key/confirmation #/EOC [id number] Status is pending

## 2024-09-05 ENCOUNTER — Other Ambulatory Visit

## 2024-09-05 DIAGNOSIS — R197 Diarrhea, unspecified: Secondary | ICD-10-CM | POA: Diagnosis not present

## 2024-09-05 NOTE — Telephone Encounter (Signed)
 Please call patient and inform her that Medicare is denying to cover the amitriptyline .  That being said, we can call the same medication into [facility] or [facility] and is like it is about $4 a month (out-of-pocket-now insurance) if the [employer] are accurate.  If she would like this medication called into the a different pharmacy we can do that for her.  Please forward prescription if she desires.  Please make sure she understands OB out-of-pocket and not through insurance

## 2024-09-05 NOTE — Telephone Encounter (Signed)
 Please return patient's call  I assure her it is used for IBS diagnoses (irritable bowel syndrome), which is what she has and why she has diarrhea.  Many medications can be used for multiple indications.  If she does not want to start a medication, course that is okay on her decision.  But I do not have any other medications we can offer for her diarrhea and less her stool studies come back positive for something we can treat.

## 2024-09-05 NOTE — Telephone Encounter (Signed)
 Called and spoke with pt. Pt given provider recommendations. Pt stated after researching Amitriptyline  she does not want to take it due to medication being listed as an anti-depressant. Pt stated I'm not depressed, I don't need an antidepressant. Nothing that I have read states this medication helps diarrhea. I do not want to take the medication.

## 2024-09-05 NOTE — Telephone Encounter (Signed)
 Pharmacy Patient Advocate Encounter  Received notification from [facility] MEDICARE that Prior Authorization for Amitriptyline  HCl 25MG  tablets has been DENIED.  See denial reason below. No denial letter attached in CMM. Will attach denial letter to Media tab once received.   PA #/Case ID/Reference #: [id number]

## 2024-09-05 NOTE — Addendum Note (Signed)
 Addended by: GEORGEAN BEEN A on: 09/05/2024 02:44 PM   Modules accepted: Orders

## 2024-09-06 LAB — GASTROINTESTINAL PANEL BY PCR, STOOL (REPLACES STOOL CULTURE)

## 2024-09-06 LAB — CLOSTRIDIUM DIFFICILE BY PCR: Toxigenic C. Difficile by PCR: NEGATIVE

## 2024-09-08 NOTE — Telephone Encounter (Signed)
 Spoke to patient and she has declined using Amitriptyline  at this time. She is wanting to wait until her stool studies come back before taking anything. Patient is aware she will be contacted with results and next steps.

## 2024-09-09 NOTE — Addendum Note (Signed)
 Addended by: Tandra Rosado A on: 09/09/2024 03:26 PM   Modules accepted: Orders

## 2024-09-09 NOTE — Telephone Encounter (Signed)
Referral placed to [facility] 

## 2024-09-09 NOTE — Telephone Encounter (Signed)
 Please call patient-make sure she did receive her results from the GI panel (telephone note yesterday?)  Her fecal leukocyte test is positive.  This points to an inflammatory disorder of the colon such as irritable bowel disease (example: Crohn's or ulcerative colitis).  The GI panel and the C. difficile panel ruled out infectious causes.  With her unintentional weight loss being as significant as it is over the last year fecal leukocytes being positive, we need to get her reevaluated by a gastroenterologist.  I will place a referral for her with the lab findings.  She was seen by Dr. Lucio in the past.  Please ask her if it is okay to place a referral back to him.

## 2024-09-12 DIAGNOSIS — Z23 Encounter for immunization: Secondary | ICD-10-CM | POA: Diagnosis not present

## 2024-09-13 LAB — FECAL LACTOFERRIN, QUANT
Fecal Lactoferrin: POSITIVE — AB
MICRO NUMBER:: 17022889
SPECIMEN QUALITY:: ADEQUATE

## 2024-09-13 LAB — PANCREATIC ELASTASE, FECAL: Pancreatic Elastase-1, Stool: 49 ug/g — ABNORMAL LOW (ref >200)

## 2024-09-16 NOTE — Telephone Encounter (Signed)
 Pt aware and verbalized understanding.

## 2024-09-29 DIAGNOSIS — Z8673 Personal history of transient ischemic attack (TIA), and cerebral infarction without residual deficits: Secondary | ICD-10-CM | POA: Diagnosis not present

## 2024-09-29 DIAGNOSIS — R197 Diarrhea, unspecified: Secondary | ICD-10-CM | POA: Diagnosis not present

## 2024-09-29 DIAGNOSIS — R195 Other fecal abnormalities: Secondary | ICD-10-CM | POA: Diagnosis not present

## 2024-09-29 DIAGNOSIS — R634 Abnormal weight loss: Secondary | ICD-10-CM | POA: Diagnosis not present

## 2024-09-29 DIAGNOSIS — Z85038 Personal history of other malignant neoplasm of large intestine: Secondary | ICD-10-CM | POA: Diagnosis not present

## 2024-09-30 DIAGNOSIS — L570 Actinic keratosis: Secondary | ICD-10-CM | POA: Diagnosis not present

## 2024-09-30 DIAGNOSIS — L814 Other melanin hyperpigmentation: Secondary | ICD-10-CM | POA: Diagnosis not present

## 2024-10-01 ENCOUNTER — Other Ambulatory Visit: Payer: Self-pay

## 2024-10-01 DIAGNOSIS — R17 Unspecified jaundice: Secondary | ICD-10-CM

## 2024-10-07 ENCOUNTER — Ambulatory Visit
Admission: RE | Admit: 2024-10-07 | Discharge: 2024-10-07 | Disposition: A | Discharge status: Home / Self Care | Source: Ambulatory Visit

## 2024-10-07 DIAGNOSIS — R17 Unspecified jaundice: Secondary | ICD-10-CM

## 2024-11-08 ENCOUNTER — Inpatient Hospital Stay (HOSPITAL_COMMUNITY)

## 2024-11-08 ENCOUNTER — Encounter (HOSPITAL_COMMUNITY): Payer: Self-pay | Admitting: Hospitalist

## 2024-11-08 ENCOUNTER — Inpatient Hospital Stay (HOSPITAL_COMMUNITY)
Admission: EM | Admit: 2024-11-08 | Discharge: 2024-11-11 | DRG: 683 | Disposition: A | Attending: Hospitalist | Admitting: Hospitalist

## 2024-11-08 ENCOUNTER — Other Ambulatory Visit: Payer: Self-pay

## 2024-11-08 DIAGNOSIS — I959 Hypotension, unspecified: Secondary | ICD-10-CM

## 2024-11-08 DIAGNOSIS — E039 Hypothyroidism, unspecified: Secondary | ICD-10-CM | POA: Diagnosis present

## 2024-11-08 DIAGNOSIS — Z87891 Personal history of nicotine dependence: Secondary | ICD-10-CM | POA: Diagnosis not present

## 2024-11-08 DIAGNOSIS — N179 Acute kidney failure, unspecified: Secondary | ICD-10-CM | POA: Diagnosis not present

## 2024-11-08 DIAGNOSIS — I700 Atherosclerosis of aorta: Secondary | ICD-10-CM | POA: Diagnosis present

## 2024-11-08 DIAGNOSIS — E871 Hypo-osmolality and hyponatremia: Secondary | ICD-10-CM | POA: Diagnosis present

## 2024-11-08 DIAGNOSIS — Z79899 Other long term (current) drug therapy: Secondary | ICD-10-CM | POA: Diagnosis not present

## 2024-11-08 DIAGNOSIS — I10 Essential (primary) hypertension: Secondary | ICD-10-CM | POA: Diagnosis present

## 2024-11-08 DIAGNOSIS — E785 Hyperlipidemia, unspecified: Secondary | ICD-10-CM | POA: Diagnosis not present

## 2024-11-08 DIAGNOSIS — I4891 Unspecified atrial fibrillation: Secondary | ICD-10-CM | POA: Diagnosis not present

## 2024-11-08 DIAGNOSIS — E1165 Type 2 diabetes mellitus with hyperglycemia: Secondary | ICD-10-CM

## 2024-11-08 DIAGNOSIS — Z794 Long term (current) use of insulin: Secondary | ICD-10-CM

## 2024-11-08 DIAGNOSIS — E876 Hypokalemia: Secondary | ICD-10-CM | POA: Insufficient documentation

## 2024-11-08 DIAGNOSIS — Z9221 Personal history of antineoplastic chemotherapy: Secondary | ICD-10-CM | POA: Diagnosis not present

## 2024-11-08 DIAGNOSIS — D649 Anemia, unspecified: Secondary | ICD-10-CM | POA: Diagnosis present

## 2024-11-08 DIAGNOSIS — Z8249 Family history of ischemic heart disease and other diseases of the circulatory system: Secondary | ICD-10-CM | POA: Diagnosis not present

## 2024-11-08 DIAGNOSIS — R739 Hyperglycemia, unspecified: Secondary | ICD-10-CM | POA: Diagnosis not present

## 2024-11-08 DIAGNOSIS — E8721 Acute metabolic acidosis: Secondary | ICD-10-CM | POA: Diagnosis present

## 2024-11-08 DIAGNOSIS — Z8673 Personal history of transient ischemic attack (TIA), and cerebral infarction without residual deficits: Secondary | ICD-10-CM

## 2024-11-08 DIAGNOSIS — Z96651 Presence of right artificial knee joint: Secondary | ICD-10-CM | POA: Diagnosis present

## 2024-11-08 DIAGNOSIS — Z85038 Personal history of other malignant neoplasm of large intestine: Secondary | ICD-10-CM | POA: Insufficient documentation

## 2024-11-08 DIAGNOSIS — K5732 Diverticulitis of large intestine without perforation or abscess without bleeding: Secondary | ICD-10-CM | POA: Diagnosis not present

## 2024-11-08 DIAGNOSIS — I160 Hypertensive urgency: Secondary | ICD-10-CM | POA: Diagnosis not present

## 2024-11-08 DIAGNOSIS — Z7902 Long term (current) use of antithrombotics/antiplatelets: Secondary | ICD-10-CM | POA: Diagnosis not present

## 2024-11-08 DIAGNOSIS — E782 Mixed hyperlipidemia: Secondary | ICD-10-CM | POA: Diagnosis present

## 2024-11-08 DIAGNOSIS — R4189 Other symptoms and signs involving cognitive functions and awareness: Secondary | ICD-10-CM | POA: Diagnosis present

## 2024-11-08 DIAGNOSIS — Z7901 Long term (current) use of anticoagulants: Secondary | ICD-10-CM | POA: Diagnosis not present

## 2024-11-08 DIAGNOSIS — E119 Type 2 diabetes mellitus without complications: Secondary | ICD-10-CM | POA: Diagnosis not present

## 2024-11-08 DIAGNOSIS — R197 Diarrhea, unspecified: Secondary | ICD-10-CM | POA: Diagnosis present

## 2024-11-08 DIAGNOSIS — I4821 Permanent atrial fibrillation: Secondary | ICD-10-CM | POA: Diagnosis present

## 2024-11-08 DIAGNOSIS — R0902 Hypoxemia: Secondary | ICD-10-CM | POA: Diagnosis not present

## 2024-11-08 DIAGNOSIS — Z833 Family history of diabetes mellitus: Secondary | ICD-10-CM | POA: Diagnosis not present

## 2024-11-08 DIAGNOSIS — N170 Acute kidney failure with tubular necrosis: Secondary | ICD-10-CM | POA: Diagnosis present

## 2024-11-08 DIAGNOSIS — Z85820 Personal history of malignant melanoma of skin: Secondary | ICD-10-CM | POA: Diagnosis not present

## 2024-11-08 DIAGNOSIS — Z79890 Hormone replacement therapy (postmenopausal): Secondary | ICD-10-CM | POA: Diagnosis not present

## 2024-11-08 DIAGNOSIS — E1151 Type 2 diabetes mellitus with diabetic peripheral angiopathy without gangrene: Secondary | ICD-10-CM | POA: Diagnosis present

## 2024-11-08 DIAGNOSIS — R531 Weakness: Secondary | ICD-10-CM | POA: Diagnosis not present

## 2024-11-08 LAB — RESPIRATORY PANEL BY PCR

## 2024-11-08 LAB — I-STAT CHEM 8, ED
BUN: 83 mg/dL — ABNORMAL HIGH (ref 8–23)
Calcium, Ion: 1.03 mmol/L — ABNORMAL LOW (ref 1.15–1.40)
Chloride: 108 mmol/L (ref 98–111)
Creatinine, Ser: 2.3 mg/dL — ABNORMAL HIGH (ref 0.44–1.00)
Glucose, Bld: 254 mg/dL — ABNORMAL HIGH (ref 70–99)
HCT: 35 % — ABNORMAL LOW (ref 36.0–46.0)
Hemoglobin: 11.9 g/dL — ABNORMAL LOW (ref 12.0–15.0)
Potassium: 3.9 mmol/L (ref 3.5–5.1)
Sodium: 135 mmol/L (ref 135–145)
TCO2: 15 mmol/L — ABNORMAL LOW (ref 22–32)

## 2024-11-08 LAB — BASIC METABOLIC PANEL WITH GFR
Anion gap: 15 (ref 5–15)
BUN: 75 mg/dL — ABNORMAL HIGH (ref 8–23)
CO2: 14 mmol/L — ABNORMAL LOW (ref 22–32)
Calcium: 8.2 mg/dL — ABNORMAL LOW (ref 8.9–10.3)
Chloride: 106 mmol/L (ref 98–111)
Creatinine, Ser: 2.34 mg/dL — ABNORMAL HIGH (ref 0.44–1.00)
GFR, Estimated: 21 mL/min — ABNORMAL LOW (ref >60)
Glucose, Bld: 262 mg/dL — ABNORMAL HIGH (ref 70–99)
Potassium: 3.5 mmol/L (ref 3.5–5.1)
Sodium: 135 mmol/L (ref 135–145)

## 2024-11-08 LAB — LIPASE, BLOOD: Lipase: 58 U/L — ABNORMAL HIGH (ref 11–51)

## 2024-11-08 LAB — CBC WITH DIFFERENTIAL/PLATELET
Abs Immature Granulocytes: 0.09 K/uL — ABNORMAL HIGH (ref 0.00–0.07)
Basophils Absolute: 0 K/uL (ref 0.0–0.1)
Basophils Relative: 0 %
Eosinophils Absolute: 0.1 K/uL (ref 0.0–0.5)
Eosinophils Relative: 1 %
HCT: 36 % (ref 36.0–46.0)
Hemoglobin: 11.5 g/dL — ABNORMAL LOW (ref 12.0–15.0)
Immature Granulocytes: 1 %
Lymphocytes Relative: 7 %
Lymphs Abs: 0.7 K/uL (ref 0.7–4.0)
MCH: 30.9 pg (ref 26.0–34.0)
MCHC: 31.9 g/dL (ref 30.0–36.0)
MCV: 96.8 fL (ref 80.0–100.0)
Monocytes Absolute: 0.5 K/uL (ref 0.1–1.0)
Monocytes Relative: 5 %
Neutro Abs: 8.9 K/uL — ABNORMAL HIGH (ref 1.7–7.7)
Neutrophils Relative %: 86 %
Platelets: 196 K/uL (ref 150–400)
RBC: 3.72 MIL/uL — ABNORMAL LOW (ref 3.87–5.11)
RDW: 14.6 % (ref 11.5–15.5)
WBC: 10.3 K/uL (ref 4.0–10.5)
nRBC: 0 % (ref 0.0–0.2)

## 2024-11-08 LAB — URINALYSIS, W/ REFLEX TO CULTURE (INFECTION SUSPECTED)
Bilirubin Urine: NEGATIVE
Glucose, UA: NEGATIVE mg/dL
Hgb urine dipstick: NEGATIVE
Ketones, ur: NEGATIVE mg/dL
Nitrite: NEGATIVE
Protein, ur: NEGATIVE mg/dL
Specific Gravity, Urine: 1.02 (ref 1.005–1.030)
pH: 5.5 (ref 5.0–8.0)

## 2024-11-08 LAB — CBC
HCT: 34.7 % — ABNORMAL LOW (ref 36.0–46.0)
Hemoglobin: 11 g/dL — ABNORMAL LOW (ref 12.0–15.0)
MCH: 30.9 pg (ref 26.0–34.0)
MCHC: 31.7 g/dL (ref 30.0–36.0)
MCV: 97.5 fL (ref 80.0–100.0)
Platelets: 163 K/uL (ref 150–400)
RBC: 3.56 MIL/uL — ABNORMAL LOW (ref 3.87–5.11)
RDW: 14.4 % (ref 11.5–15.5)
WBC: 10.8 K/uL — ABNORMAL HIGH (ref 4.0–10.5)
nRBC: 0 % (ref 0.0–0.2)

## 2024-11-08 LAB — GLUCOSE, CAPILLARY: Glucose-Capillary: 246 mg/dL — ABNORMAL HIGH (ref 70–99)

## 2024-11-08 LAB — HEPATIC FUNCTION PANEL
ALT: 13 U/L (ref 0–44)
AST: 19 U/L (ref 15–41)
Albumin: 2.9 g/dL — ABNORMAL LOW (ref 3.5–5.0)
Alkaline Phosphatase: 86 U/L (ref 38–126)
Bilirubin, Direct: 0.2 mg/dL (ref 0.0–0.2)
Indirect Bilirubin: 0.7 mg/dL (ref 0.3–0.9)
Total Bilirubin: 0.9 mg/dL (ref 0.0–1.2)
Total Protein: 6.8 g/dL (ref 6.5–8.1)

## 2024-11-08 LAB — I-STAT CG4 LACTIC ACID, ED: Lactic Acid, Venous: 1.1 mmol/L (ref 0.5–1.9)

## 2024-11-08 LAB — CREATININE, SERUM
Creatinine, Ser: 2.19 mg/dL — ABNORMAL HIGH (ref 0.44–1.00)
GFR, Estimated: 23 mL/min — ABNORMAL LOW (ref >60)

## 2024-11-08 MED ORDER — METOPROLOL TARTRATE 50 MG PO TABS
50.0000 mg | ORAL_TABLET | Freq: Two times a day (BID) | ORAL | Status: DC
  Administered 2024-11-10 – 2024-11-11 (×3): 50 mg via ORAL
  Filled 2024-11-08 (×5): qty 1

## 2024-11-08 MED ORDER — ENOXAPARIN SODIUM 30 MG/0.3ML IJ SOSY
30.0000 mg | PREFILLED_SYRINGE | INTRAMUSCULAR | Status: DC
  Administered 2024-11-08: 30 mg via SUBCUTANEOUS

## 2024-11-08 MED ORDER — LEVOTHYROXINE SODIUM 50 MCG PO TABS
50.0000 ug | ORAL_TABLET | Freq: Every day | ORAL | Status: DC
  Administered 2024-11-09 – 2024-11-11 (×3): 50 ug via ORAL
  Filled 2024-11-08 (×3): qty 1

## 2024-11-08 MED ORDER — INSULIN ASPART 100 UNIT/ML IJ SOLN
0.0000 [IU] | Freq: Three times a day (TID) | INTRAMUSCULAR | Status: DC
  Administered 2024-11-09: 8 [IU] via SUBCUTANEOUS
  Administered 2024-11-09: 2 [IU] via SUBCUTANEOUS
  Administered 2024-11-09: 8 [IU] via SUBCUTANEOUS
  Administered 2024-11-10: 3 [IU] via SUBCUTANEOUS
  Administered 2024-11-10: 2 [IU] via SUBCUTANEOUS
  Administered 2024-11-10: 3 [IU] via SUBCUTANEOUS
  Filled 2024-11-08: qty 3
  Filled 2024-11-08: qty 2
  Filled 2024-11-08: qty 3
  Filled 2024-11-08 (×2): qty 8
  Filled 2024-11-08: qty 2

## 2024-11-08 MED ORDER — SODIUM CHLORIDE 0.9 % IV SOLN: INTRAVENOUS | Status: DC

## 2024-11-08 MED ORDER — INSULIN GLARGINE-YFGN 100 UNIT/ML ~~LOC~~ SOLN
46.0000 [IU] | Freq: Every day | SUBCUTANEOUS | Status: DC
  Administered 2024-11-09 – 2024-11-11 (×3): 46 [IU] via SUBCUTANEOUS
  Filled 2024-11-08 (×3): qty 0.46

## 2024-11-08 MED ORDER — ENSURE PLUS HIGH PROTEIN PO LIQD: 237.0000 mL | Freq: Two times a day (BID) | ORAL | Status: DC

## 2024-11-08 MED ORDER — OLMESARTAN MEDOXOMIL-HCTZ 40-25 MG PO TABS: 1.0000 | ORAL_TABLET | Freq: Every day | ORAL | Status: DC

## 2024-11-08 MED ORDER — LACTATED RINGERS IV BOLUS
1000.0000 mL | Freq: Once | INTRAVENOUS | Status: AC
  Administered 2024-11-08: 1000 mL via INTRAVENOUS

## 2024-11-08 MED ORDER — DABIGATRAN ETEXILATE MESYLATE 150 MG PO CAPS
150.0000 mg | ORAL_CAPSULE | Freq: Two times a day (BID) | ORAL | Status: DC
  Administered 2024-11-08 – 2024-11-11 (×6): 150 mg via ORAL
  Filled 2024-11-08 (×7): qty 1

## 2024-11-08 MED ORDER — BASAGLAR KWIKPEN 100 UNIT/ML ~~LOC~~ SOPN: 46.0000 [IU] | PEN_INJECTOR | SUBCUTANEOUS | Status: DC

## 2024-11-08 NOTE — ED Notes (Signed)
 CCMD

## 2024-11-08 NOTE — ED Triage Notes (Signed)
 Pt bib [facility] from home. Having diarrhea x 1 year, has GI doctor. Lives alone, frequent falls. Pt significantly weaker this morning. Initial bp 50/20, improved to 70/40 after 2L of LR. Hx of afib  256 CBG

## 2024-11-08 NOTE — ED Provider Notes (Signed)
 [place] EMERGENCY DEPARTMENT AT [facility] Provider Note   CSN: [id number] Arrival date & time: 11/08/24  1235     Patient presents with: Hypotension   Ann [name] is a 73 y.o. female.   73 year old female presents for eval ration of hypertension.  Per EMS report blood pressure was 60/20 at home and improved after 2 L to 70/40.  Patient states she has had a year of diarrhea has been off-and-on, but over the last few days has been somewhat worse.  She has felt weaker as well.  No recent antibiotic use.  She has follow-up with GI but next appointment not till mid December.  States she has had increased trouble getting around due to weakness as well as chills.  Denies any other symptoms or concerns.        Prior to Admission medications   Medication Sig Start Date End Date Taking? Authorizing Provider  allopurinol  (ZYLOPRIM ) 100 MG tablet Take 1 tablet (100 mg total) by mouth daily. 08/05/24   Kuneff, Renee A, DO  ALPRAZolam  (XANAX ) 0.25 MG tablet Take 1 tablet (0.25 mg total) by mouth daily as needed for anxiety. 02/19/24   Kuneff, Renee A, DO  amitriptyline  (ELAVIL ) 25 MG tablet Take 1-3 tablets (25-75 mg total) by mouth at bedtime. 08/29/24   Kuneff, Renee A, DO  colchicine  0.6 MG tablet 2 tabs at onset of flare, followed by 1 tab 2 hours later. Then continue 1 tab daily until flare is resolved. 08/05/24   Kuneff, Renee A, DO  Continuous Glucose Sensor (DEXCOM G7 SENSOR) MISC 1 DEVICE BY DOES NOT APPLY ROUTE AS DIRECTED. 01/21/24   Shamleffer, Donell Cardinal, MD  cyanocobalamin  (VITAMIN B12) 1000 MCG/ML injection Inject 1 mL (1,000 mcg total) into the muscle every 14 (fourteen) days. 08/05/24   Kuneff, Renee A, DO  dabigatran  (PRADAXA ) 150 MG CAPS capsule Take 1 capsule (150 mg total) by mouth 2 (two) times daily. 01/31/23   Ladona Heinz, MD  EPIPEN 2-PAK 0.3 MG/0.3ML SOAJ injection Inject 0.3 mg into the skin as needed. Bee stings 11/11/15   [provider]   insulin  aspart (NOVOLOG  FLEXPEN) 100 UNIT/ML FlexPen Max daily 60 units 09/03/24   Shamleffer, Ibtehal Jaralla, MD  Insulin  Glargine (BASAGLAR  KWIKPEN) 100 UNIT/ML Inject 46 Units into the skin every morning. 09/03/24   Shamleffer, Ibtehal Jaralla, MD  Insulin  Pen Needle 30G X 6 MM MISC 1 Device by Does not apply route in the morning, at noon, in the evening, and at bedtime. 09/03/24   Shamleffer, Ibtehal Jaralla, MD  levothyroxine  (SYNTHROID ) 50 MCG tablet Take 1 tablet (50 mcg total) by mouth daily before breakfast. 08/05/24   Kuneff, Renee A, DO  metoprolol  tartrate (LOPRESSOR ) 50 MG tablet Take 1 tablet (50 mg total) by mouth 2 (two) times daily. 08/05/24   Kuneff, Renee A, DO  olmesartan -hydrochlorothiazide  (BENICAR  HCT) 40-25 MG tablet Take 1 tablet by mouth daily. 08/05/24   Catherine, Renee A, DO  ONETOUCH ULTRA test strip SMARTSIG:Via Meter 10/18/20   [provider]  PRESCRIPTION MEDICATION Inject 50,000 mcg as directed every 14 (fourteen) days. Vitamin B12 injections    [provider]  triamcinolone  ointment (KENALOG ) 0.5 % Apply topically 2 (two) times daily as needed. 02/07/21   [provider]  Vitamin D , Ergocalciferol , (DRISDOL ) 1.25 MG (50000 UNIT) CAPS capsule TAKE 1 CAPSULE BY MOUTH ON MONDAY AND THURSDAY WITH FOOD 05/14/24   Kuneff, Renee A, DO  zolpidem  (AMBIEN ) 5 MG tablet Take 1  tablet (5 mg total) by mouth at bedtime as needed. 08/05/24   Kuneff, Renee A, DO    Allergies: Amoxicillin, Ampicillin, Codeine, Ezetimibe, Livalo [pitavastatin], and Statins    Review of Systems  Constitutional:  Positive for chills and fatigue. Negative for fever.  HENT:  Negative for ear pain and sore throat.   Eyes:  Negative for pain and visual disturbance.  Respiratory:  Negative for cough and shortness of breath.   Cardiovascular:  Negative for chest pain and palpitations.  Gastrointestinal:  Positive for diarrhea. Negative for abdominal pain and vomiting.  Genitourinary:   Negative for dysuria and hematuria.  Musculoskeletal:  Negative for arthralgias and back pain.  Skin:  Negative for color change and rash.  Neurological:  Positive for weakness. Negative for seizures and syncope.  All other systems reviewed and are negative.   Updated Vital Signs BP (!) 114/56   Pulse 92   Temp 97.8 F (36.6 C) (Oral)   Resp 16   SpO2 98%   Physical Exam Vitals and nursing note reviewed.  Constitutional:      General: She is not in acute distress.    Appearance: Normal appearance. She is well-developed. She is not ill-appearing.  HENT:     Head: Normocephalic and atraumatic.  Eyes:     Conjunctiva/sclera: Conjunctivae normal.  Cardiovascular:     Rate and Rhythm: Normal rate and regular rhythm.     Heart sounds: No murmur heard. Pulmonary:     Effort: Pulmonary effort is normal. No respiratory distress.     Breath sounds: Normal breath sounds.  Abdominal:     Palpations: Abdomen is soft.     Tenderness: There is no abdominal tenderness.  Musculoskeletal:        General: No swelling.     Cervical back: Neck supple.  Skin:    General: Skin is warm and dry.     Capillary Refill: Capillary refill takes less than 2 seconds.  Neurological:     Mental Status: She is alert.  Psychiatric:        Mood and Affect: Mood normal.     (all labs ordered are listed, but only abnormal results are displayed) Labs Reviewed  CBC WITH DIFFERENTIAL/PLATELET - Abnormal; Notable for the following components:      Result Value   RBC 3.72 (*)    Hemoglobin 11.5 (*)    Neutro Abs 8.9 (*)    Abs Immature Granulocytes 0.09 (*)    All other components within normal limits  BASIC METABOLIC PANEL WITH GFR - Abnormal; Notable for the following components:   CO2 14 (*)    Glucose, Bld 262 (*)    BUN 75 (*)    Creatinine, Ser 2.34 (*)    Calcium 8.2 (*)    GFR, Estimated 21 (*)    All other components within normal limits  HEPATIC FUNCTION PANEL - Abnormal; Notable for the  following components:   Albumin 2.9 (*)    All other components within normal limits  LIPASE, BLOOD - Abnormal; Notable for the following components:   Lipase 58 (*)    All other components within normal limits  URINALYSIS, W/ REFLEX TO CULTURE (INFECTION SUSPECTED) - Abnormal; Notable for the following components:   APPearance CLOUDY (*)    Leukocytes,Ua SMALL (*)    Bacteria, UA RARE (*)    All other components within normal limits  CBC - Abnormal; Notable for the following components:   WBC 10.8 (*)  RBC 3.56 (*)    Hemoglobin 11.0 (*)    HCT 34.7 (*)    All other components within normal limits  I-STAT CHEM 8, ED - Abnormal; Notable for the following components:   BUN 83 (*)    Creatinine, Ser 2.30 (*)    Glucose, Bld 254 (*)    Calcium, Ion 1.03 (*)    TCO2 15 (*)    Hemoglobin 11.9 (*)    HCT 35.0 (*)    All other components within normal limits  C DIFFICILE QUICK SCREEN W PCR REFLEX    CREATININE, SERUM  I-STAT CG4 LACTIC ACID, ED  I-STAT CG4 LACTIC ACID, ED    EKG: EKG Interpretation Date/Time:  Saturday November 08 2024 12:56:03 EST Ventricular Rate:  82 PR Interval:    QRS Duration:  89 QT Interval:  357 QTC Calculation: 417 R Axis:   67  Text Interpretation: Atrial flutter Probable anteroseptal infarct, old Borderline T abnormalities, inferior leads Compared with prior EKG from 07/18/2023 Confirmed by Gennaro Bouchard ([id number]) on 11/08/2024 1:08:00 PM  Radiology: No results found.   .Critical Care  Performed by: Gennaro Bouchard CROME, DO Authorized by: Gennaro Bouchard CROME, DO   Critical care provider statement:    Critical care time (minutes):  30   Critical care was necessary to treat or prevent imminent or life-threatening deterioration of the following conditions:  Dehydration   Critical care was time spent personally by me on the following activities:  Development of treatment plan with patient or surrogate, discussions with consultants, evaluation of  patient's response to treatment, examination of patient, ordering and review of laboratory studies, ordering and review of radiographic studies, ordering and performing treatments and interventions, pulse oximetry, re-evaluation of patient's condition and review of old charts   Care discussed with: admitting provider      Medications Ordered in the ED  enoxaparin (LOVENOX) injection 30 mg (has no administration in time range)  0.9 %  sodium chloride  infusion (has no administration in time range)  lactated ringers  bolus 1,000 mL (1,000 mLs Intravenous New Bag/Given 11/08/24 1300)                                    Medical Decision Making Cardiac monitor interpretation: Sinus rhythm, no ectopy  Social determinants of health: Patient lives alone  Patient here for hypotension.  She has had chronic diarrhea for years but worse over the last few days.  Was found to have blood pressure in the 40s which improved to the 70s after 2 L IV fluid.  Given 1/3 L and blood pressure now in the 100s.  Vitals otherwise been stable and she is overall feeling improved.  She does have an AKI.  CT abdomen pelvis pending.  Discussed patient's case with hospitalist the patient was admitted for further workup and management.  All results and plan discussed with patient and family at bedside.  Problems Addressed: AKI (acute kidney injury): acute illness or injury that poses a threat to life or bodily functions Diarrhea, unspecified type: acute illness or injury Hypotension, unspecified hypotension type: acute illness or injury that poses a threat to life or bodily functions  Amount and/or Complexity of Data Reviewed External Data Reviewed: notes.    Details: No prior ED records for review Labs: ordered. Decision-making details documented in ED Course.    Details: Ordered and reviewed by me and patient has an AKI Radiology:  ordered and independent interpretation performed. Decision-making details documented in ED  Course.    Details: Ordered and CT abdomen pelvis is pending Discussion of management or test interpretation with external provider(s): Dr. Libbie will admit the patient for further workup and management  Risk OTC drugs. Prescription drug management. Parenteral controlled substances. Drug therapy requiring intensive monitoring for toxicity. Decision regarding hospitalization. Diagnosis or treatment significantly limited by social determinants of health. Risk Details: CRITICAL CARE Performed by: Duwaine LITTIE Fusi   Total critical care time: 30 minutes  Critical care time was exclusive of separately billable procedures and treating other patients.  Critical care was necessary to treat or prevent imminent or life-threatening deterioration.  Critical care was time spent personally by me on the following activities: development of treatment plan with patient and/or surrogate as well as nursing, discussions with consultants, evaluation of patient's response to treatment, examination of patient, obtaining history from patient or surrogate, ordering and performing treatments and interventions, ordering and review of laboratory studies, ordering and review of radiographic studies, pulse oximetry and re-evaluation of patient's condition.   Critical Care Total time providing critical care: 30 minutes    Final diagnoses:  AKI (acute kidney injury)  Hypotension, unspecified hypotension type  Diarrhea, unspecified type    ED Discharge Orders     None          Fusi Duwaine LITTIE, DO 11/08/24 1514

## 2024-11-08 NOTE — Plan of Care (Signed)
  Problem: Education: Goal: Knowledge of General Education information will improve Description: Including pain rating scale, medication(s)/side effects and non-pharmacologic comfort measures 11/08/2024 1827 by Casimir Keturah LABOR, RN Outcome: Progressing 11/08/2024 1546 by Casimir Keturah LABOR, RN Outcome: Progressing   Problem: Clinical Measurements: Goal: Ability to maintain clinical measurements within normal limits will improve 11/08/2024 1827 by Casimir Keturah LABOR, RN Outcome: Progressing 11/08/2024 1546 by Casimir Keturah LABOR, RN Outcome: Progressing   Problem: Clinical Measurements: Goal: Will remain free from infection 11/08/2024 1827 by Casimir Keturah LABOR, RN Outcome: Progressing 11/08/2024 1546 by Casimir Keturah LABOR, RN Outcome: Progressing   Problem: Clinical Measurements: Goal: Diagnostic test results will improve 11/08/2024 1827 by Casimir Keturah LABOR, RN Outcome: Progressing 11/08/2024 1546 by Casimir Keturah LABOR, RN Outcome: Progressing   Problem: Clinical Measurements: Goal: Cardiovascular complication will be avoided 11/08/2024 1827 by Casimir Keturah LABOR, RN Outcome: Progressing 11/08/2024 1546 by Casimir Keturah LABOR, RN Outcome: Progressing

## 2024-11-08 NOTE — Plan of Care (Signed)

## 2024-11-08 NOTE — H&P (Signed)
 History and Physical    Patient: Ann [name] FMW:[id number] DOB: 03-Mar-1951 DOA: 11/08/2024 DOS: the patient was seen and examined on 11/08/2024 PCP: Catherine Charlies LABOR, DO  Patient coming from: Home  Chief Complaint:  Chief Complaint  Patient presents with   Hypotension   HPI: Ann [name] is a 73 y.o. female with medical history significant of prior basal ganglia CVA in 07/2023, Afib (on [facility]), colon cancer s/p chemotherapy/radiation (follows Dr. Dallie), HTN, HLD c/b statin myopathy, DM2, hypothyroidism, OSA, and most recent evaluation per [facility] on 09/03/2024 for p/w chronic diarrhea and found to have hypotension c/b AKI.  The patient described having diarrhea for a year and a half and reported that it was watery and yellow in color. The patient had consulted with multiple healthcare providers, including a gastroenterologist, but had not received a definitive diagnosis. A colonoscopy and CT scan were performed in September which were normal per pt report. The patient reported progressively worsening symptoms, including low blood pressure, SBP was notably 55 upon EMS arrival. The patient described feeling dizzy and lightheaded and experienced significant weight loss, going from 298 pounds to 230 pounds. The patient did not report any abdominal pain.  In the ED, pt AFVSS (NOTE: Pt noted to have SBP 50s per EMS on arrival). Labs notable for Cr 2.3 (baseline 1.0-1.1). UA positive for pyuria and bacteruria. EDP started IVF and requested medicine admission for AKI.    Review of Systems: As mentioned in the history of present illness. All other systems reviewed and are negative. Past Medical History:  Diagnosis Date   Arthritis    osteoarthritis- knees.   Atrial fibrillation (HCC)    Dr. Debby status post cardioversion chronic anticoagulation   Basal ganglia stroke (HCC) 07/24/2023   Carotid artery occlusion    Cellulitis of right lower extremity 12/30/2020    Chicken pox    Colon cancer (HCC) 2001   Radiation, chemo.  Adenocarcinoma of the colon.  Followed by Dr. Dallie   Complication of anesthesia    sensitive to narcoticsextra sleepy. Versed - no memory recall for 4 days postop   Diabetes mellitus without complication (HCC)    oral and Insulin    Diarrhea 07/09/2023   Diverticulosis 07/23/2023   Hepatitis    sub clinical case of Hepatitis in late 70'shas immunities   Hypertension    Hypothyroidism    Laceration of head 08/04/2019   8 cm laceration forehead/scalp after tripping when mowing her lawn.   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   Ovarian teratoma, right 09/2000   Oophorectomy   Sleep apnea    unable to tolreate cpap   Wisdom teeth extracted    Word finding difficulty 07/09/2023   Past Surgical History:  Procedure Laterality Date   ABDOMINAL HYSTERECTOMY  09/2000    Total hysterectomy and oophorectomy   ABDOMINAL WOUND DEHISCENCE Bilateral    multiple times / mesh use with multiple abdominal hernia repair   CARDIOVERSION  2014   Unsuccessful.  Patient reports cardiac ablation was not felt to be appropriate next step by cardiology.   CHOLECYSTECTOMY  04/1998   '98 -Lapraroscopic   COLON RESECTION  2001   colon cancer   FOOT SURGERY Bilateral    multiple hammer toes and bunionectomies   KNEE ARTHROSCOPY Left 08/2006   meniscectomy   MELANOMA EXCISION  near scapula   TOTAL KNEE ARTHROPLASTY Right 01/24/2016   Procedure: RIGHT TOTAL KNEE ARTHROPLASTY;  Surgeon: Dempsey Moan, MD;  Location: [facility];  Service: Orthopedics;  Laterality: Right;   Social History:  reports that she quit smoking about 27 years ago. Her smoking use included cigarettes. She started smoking about 52 years ago. She has never used smokeless tobacco. She reports that she does not drink alcohol and does not use  drugs.  Allergies  Allergen Reactions   Amoxicillin Shortness Of Breath, Swelling and Anaphylaxis    Has patient had a PCN reaction causing immediate rash, facial/tongue/throat swelling, SOB or lightheadedness with hypotension: Yes Has patient had a PCN reaction causing severe rash involving mucus membranes or skin necrosis: No Has patient had a PCN reaction that required hospitalization No Has patient had a PCN reaction occurring within the last 10 years: Yes If all of the above answers are NO, then may proceed with Cephalosporin use.  Has patient had a PCN reaction causing immediate rash, facial/tongue/throat swelling, SOB or lightheadedness with hypotension: Yes Has patient had a PCN reaction causing severe rash involving mucus membranes or skin necrosis: No Has patient had a PCN reaction that required hospitalization No Has patient had a PCN reaction occurring within the last 10 years: Yes If all of the above answers are NO, then may proceed with Cephalosporin use. Has patient had a PCN reaction causing immediate rash, facial/tongue/throat swelling, SOB or lightheadedness with hypotension: Yes Has patient had a PCN reaction causing severe rash involving mucus membranes or skin necrosis: No Has patient had a PCN reaction that required hospitalization No Has patient had a PCN reaction occurring within the last 10 years: Yes If all of the above answers are NO, then may proceed with Cephalosporin use.    Ampicillin    Codeine Itching    Tolerates hydrocodone    Ezetimibe    Livalo [Pitavastatin]    Statins Other (See Comments)    Muscle skeletal side effects    Family History  Problem Relation Age of Onset   Arthritis Mother    Colon cancer Mother    Diabetes Mother    Atrial fibrillation Mother    Diabetes Father    Heart attack Father    Heart disease Father    Stroke Father    Hypertension Maternal Grandmother    Stroke Maternal Grandmother    Stroke Maternal  Grandfather    Hypertension Paternal Grandmother    Stroke Paternal Grandmother    Diabetes Paternal Grandfather    Hypertension Paternal Grandfather    Stroke Paternal Grandfather    Diabetes Brother    Brain cancer Brother    Heart disease Brother    Hypertension Brother    Stroke Brother     Prior to Admission medications   Medication Sig Start Date End Date Taking? Authorizing Provider  allopurinol  (ZYLOPRIM ) 100 MG tablet Take 1 tablet (100 mg total) by mouth daily. 08/05/24   Kuneff, Renee A, DO  ALPRAZolam  (XANAX ) 0.25 MG tablet Take 1 tablet (0.25 mg total) by mouth daily as needed for anxiety. 02/19/24   Kuneff, Renee A, DO  amitriptyline  (ELAVIL ) 25 MG tablet Take 1-3 tablets (25-75 mg total) by mouth at bedtime. 08/29/24   Kuneff, Renee A, DO  colchicine  0.6 MG tablet 2 tabs at onset of flare, followed by 1 tab 2 hours later. Then continue 1 tab daily until flare is resolved. 08/05/24   Kuneff, Renee A, DO  Continuous Glucose Sensor (DEXCOM  G7 SENSOR) MISC 1 DEVICE BY DOES NOT APPLY ROUTE AS DIRECTED. 01/21/24   Shamleffer, Donell Cardinal, MD  cyanocobalamin  (VITAMIN B12) 1000 MCG/ML injection Inject 1 mL (1,000 mcg total) into the muscle every 14 (fourteen) days. 08/05/24   Kuneff, Renee A, DO  dabigatran  (PRADAXA ) 150 MG CAPS capsule Take 1 capsule (150 mg total) by mouth 2 (two) times daily. 01/31/23   Ladona Heinz, MD  EPIPEN 2-PAK 0.3 MG/0.3ML SOAJ injection Inject 0.3 mg into the skin as needed. Bee stings 11/11/15   [provider]  insulin  aspart (NOVOLOG  FLEXPEN) 100 UNIT/ML FlexPen Max daily 60 units 09/03/24   Shamleffer, Ibtehal Jaralla, MD  Insulin  Glargine (BASAGLAR  KWIKPEN) 100 UNIT/ML Inject 46 Units into the skin every morning. 09/03/24   Shamleffer, Ibtehal Jaralla, MD  Insulin  Pen Needle 30G X 6 MM MISC 1 Device by Does not apply route in the morning, at noon, in the evening, and at bedtime. 09/03/24   Shamleffer, Ibtehal Jaralla, MD  levothyroxine  (SYNTHROID ) 50  MCG tablet Take 1 tablet (50 mcg total) by mouth daily before breakfast. 08/05/24   Kuneff, Renee A, DO  metoprolol  tartrate (LOPRESSOR ) 50 MG tablet Take 1 tablet (50 mg total) by mouth 2 (two) times daily. 08/05/24   Kuneff, Renee A, DO  olmesartan -hydrochlorothiazide  (BENICAR  HCT) 40-25 MG tablet Take 1 tablet by mouth daily. 08/05/24   Catherine, Renee A, DO  ONETOUCH ULTRA test strip SMARTSIG:Via Meter 10/18/20   [provider]  PRESCRIPTION MEDICATION Inject 50,000 mcg as directed every 14 (fourteen) days. Vitamin B12 injections    [provider]  triamcinolone  ointment (KENALOG ) 0.5 % Apply topically 2 (two) times daily as needed. 02/07/21   [provider]  Vitamin D , Ergocalciferol , (DRISDOL ) 1.25 MG (50000 UNIT) CAPS capsule TAKE 1 CAPSULE BY MOUTH ON MONDAY AND THURSDAY WITH FOOD 05/14/24   Kuneff, Renee A, DO  zolpidem  (AMBIEN ) 5 MG tablet Take 1 tablet (5 mg total) by mouth at bedtime as needed. 08/05/24   Catherine Charlies LABOR, DO    Physical Exam: Vitals:   11/08/24 1241 11/08/24 1505 11/08/24 1528  BP: (!) 114/56  121/64  Pulse: 92  82  Resp: 16  18  Temp: 97.8 F (36.6 C)  (!) 97.4 F (36.3 C)  TempSrc: Oral    SpO2: 98% 98% 100%   General: Alert, oriented x3, resting comfortably in no acute distress Respiratory: Lungs clear to auscultation bilaterally with normal respiratory effort; no w/r/r Cardiovascular: Regular rate and rhythm w/o m/r/g Abdomen: Soft, nontender, nondistended. Positive bowel sounds   Data Reviewed:  Lab Results  Component Value Date   WBC 10.8 (H) 11/08/2024   HGB 11.0 (L) 11/08/2024   HCT 34.7 (L) 11/08/2024   MCV 97.5 11/08/2024   PLT 163 11/08/2024   Lab Results  Component Value Date   GLUCOSE 254 (H) 11/08/2024   CALCIUM 8.2 (L) 11/08/2024   NA 135 11/08/2024   K 3.9 11/08/2024   CO2 14 (L) 11/08/2024   CL 108 11/08/2024   BUN 83 (H) 11/08/2024   CREATININE 2.19 (H) 11/08/2024   Lab Results  Component Value Date    ALT 13 11/08/2024   AST 19 11/08/2024   ALKPHOS 86 11/08/2024   BILITOT 0.9 11/08/2024   Lab Results  Component Value Date   INR 1.11 01/18/2016   Radiology: No results found.   Assessment and Plan: 46F h/o prior basal ganglia CVA in 07/2023, Afib (on [facility]), colon cancer s/p chemotherapy/radiation (follows Dr.  Hines), HTN, HLD c/b statin myopathy, DM2, hypothyroidism, OSA, and most recent evaluation per [facility] on 09/03/2024 for p/w chronic diarrhea and found to have hypotension c/b AKI.  AKI, prerenal Abnormal UA Hypotension UA with pyuria and bacteruria; high suspicion for pre-renal AKI is diarrhea and decreased PO intake -MIVF: NS at 50cc/h for 24h -Strict I&Os and daily weights (standing preferred) -F/u PVR to r/o post-renal obstruction -F/u BMP daily -Renally dose medications for CrCl -Avoid lovenox, NSAIDs, morphine, Fleet's phosphate enema, regular insulin , contrast; no gadolinium for MRI to avoid nephrogenic systemic fibrosis -Consider renal US  and nephrology consult if worsening AKI -F/u urine culture to exclude acute cystitis  Chronic diarrhea Unclear etiology -MIVF per above -F/u GIP -OP GI f/u planned for 12/14  Afib -PTA Pradaxa  150mg  BID and metoprolol  50mg  BID  HTN -PTA metoprolol  tartrate 50mg  BID -HOLD PTA Benicar  in light of AKI; consider resuming at d/c  DM2 -PTA 46U long acting insulin  + SSI TID AC prn  Hypothyroid -PTA Synthroid     Advance Care Planning:   Code Status: Full Code   Consults: N/A  Family Communication: Sister  Severity of Illness: The appropriate patient status for this patient is INPATIENT. Inpatient status is judged to be reasonable and necessary in order to provide the required intensity of service to ensure the patient's safety. The patient's presenting symptoms, physical exam findings, and initial radiographic and laboratory data in the context of their chronic comorbidities is felt to place  them at high risk for further clinical deterioration. Furthermore, it is not anticipated that the patient will be medically stable for discharge from the hospital within 2 midnights of admission.   * I certify that at the point of admission it is my clinical judgment that the patient will require inpatient hospital care spanning beyond 2 midnights from the point of admission due to high intensity of service, high risk for further deterioration and high frequency of surveillance required.*   ------- I spent 56 minutes reviewing previous notes, at the bedside counseling/discussing the treatment plan, and performing clinical documentation.  Author: Marsha Ada, MD 11/08/2024 4:54 PM  For on call review [email or web address].

## 2024-11-09 ENCOUNTER — Telehealth: Payer: Self-pay | Admitting: Nurse Practitioner

## 2024-11-09 DIAGNOSIS — N179 Acute kidney failure, unspecified: Secondary | ICD-10-CM | POA: Diagnosis not present

## 2024-11-09 DIAGNOSIS — E871 Hypo-osmolality and hyponatremia: Secondary | ICD-10-CM | POA: Insufficient documentation

## 2024-11-09 DIAGNOSIS — E876 Hypokalemia: Secondary | ICD-10-CM | POA: Insufficient documentation

## 2024-11-09 DIAGNOSIS — E8721 Acute metabolic acidosis: Secondary | ICD-10-CM | POA: Insufficient documentation

## 2024-11-09 DIAGNOSIS — Z85038 Personal history of other malignant neoplasm of large intestine: Secondary | ICD-10-CM | POA: Insufficient documentation

## 2024-11-09 LAB — GLUCOSE, CAPILLARY
Glucose-Capillary: 260 mg/dL — ABNORMAL HIGH (ref 70–99)
Glucose-Capillary: 281 mg/dL — ABNORMAL HIGH (ref 70–99)
Glucose-Capillary: 140 mg/dL — ABNORMAL HIGH (ref 70–99)
Glucose-Capillary: 140 mg/dL — ABNORMAL HIGH (ref 70–99)

## 2024-11-09 LAB — BASIC METABOLIC PANEL WITH GFR
Anion gap: 14 (ref 5–15)
BUN: 64 mg/dL — ABNORMAL HIGH (ref 8–23)
CO2: 16 mmol/L — ABNORMAL LOW (ref 22–32)
Calcium: 7.9 mg/dL — ABNORMAL LOW (ref 8.9–10.3)
Chloride: 104 mmol/L (ref 98–111)
Creatinine, Ser: 1.79 mg/dL — ABNORMAL HIGH (ref 0.44–1.00)
GFR, Estimated: 29 mL/min — ABNORMAL LOW (ref >60)
Glucose, Bld: 261 mg/dL — ABNORMAL HIGH (ref 70–99)
Potassium: 3 mmol/L — ABNORMAL LOW (ref 3.5–5.1)
Sodium: 134 mmol/L — ABNORMAL LOW (ref 135–145)

## 2024-11-09 LAB — GASTROINTESTINAL PANEL BY PCR, STOOL (REPLACES STOOL CULTURE)

## 2024-11-09 LAB — C DIFFICILE QUICK SCREEN W PCR REFLEX
C Diff antigen: NEGATIVE
C Diff interpretation: NOT DETECTED
C Diff toxin: NEGATIVE

## 2024-11-09 LAB — MAGNESIUM: Magnesium: 1.2 mg/dL — ABNORMAL LOW (ref 1.7–2.4)

## 2024-11-09 MED ORDER — POTASSIUM CHLORIDE CRYS ER 20 MEQ PO TBCR
40.0000 meq | EXTENDED_RELEASE_TABLET | Freq: Two times a day (BID) | ORAL | Status: AC
  Administered 2024-11-09 (×2): 40 meq via ORAL
  Filled 2024-11-09 (×2): qty 2

## 2024-11-09 MED ORDER — ALPRAZOLAM 0.25 MG PO TABS
0.2500 mg | ORAL_TABLET | Freq: Every day | ORAL | Status: DC | PRN
  Filled 2024-11-09: qty 1

## 2024-11-09 MED ORDER — AMITRIPTYLINE HCL 50 MG PO TABS
25.0000 mg | ORAL_TABLET | Freq: Every day | ORAL | Status: DC
  Filled 2024-11-09 (×2): qty 1

## 2024-11-09 MED ORDER — MAGNESIUM SULFATE 4 GM/100ML IV SOLN
4.0000 g | Freq: Once | INTRAVENOUS | Status: AC
  Administered 2024-11-09: 4 g via INTRAVENOUS
  Filled 2024-11-09: qty 100

## 2024-11-09 MED ORDER — LOPERAMIDE HCL 2 MG PO CAPS: 2.0000 mg | ORAL_CAPSULE | ORAL | Status: DC | PRN

## 2024-11-09 MED ORDER — POTASSIUM CHLORIDE IN NACL 20-0.9 MEQ/L-% IV SOLN
INTRAVENOUS | Status: DC
  Filled 2024-11-09 (×4): qty 1000

## 2024-11-09 MED ORDER — POTASSIUM CHLORIDE IN NACL 20-0.9 MEQ/L-% IV SOLN
INTRAVENOUS | Status: AC
  Filled 2024-11-09: qty 1000

## 2024-11-09 MED ORDER — ZOLPIDEM TARTRATE 5 MG PO TABS
5.0000 mg | ORAL_TABLET | Freq: Every evening | ORAL | Status: DC | PRN
  Administered 2024-11-09 – 2024-11-10 (×2): 5 mg via ORAL
  Filled 2024-11-09 (×2): qty 1

## 2024-11-09 MED ORDER — PANCRELIPASE (LIP-PROT-AMYL) 12000-38000 UNITS PO CPEP
12000.0000 [IU] | ORAL_CAPSULE | Freq: Three times a day (TID) | ORAL | Status: DC
  Filled 2024-11-09 (×2): qty 1

## 2024-11-09 NOTE — Assessment & Plan Note (Signed)
 Peripheral vascular disease, carotid disease Statin intolerant, follows with endocrinology

## 2024-11-09 NOTE — Assessment & Plan Note (Signed)
 Due to diarrhea and AKI - Continue IV fluids - Trend BMP

## 2024-11-09 NOTE — Hospital Course (Signed)
 73 y.o. F with obesity, colon CA in 2001 s/p partial colectomy, IDDM, hx CVA, HTN, permAF on Pradaxa , carotid disease, and hypothyroidism who presented with hypotension.  Found to have AKI.

## 2024-11-09 NOTE — Assessment & Plan Note (Signed)
Mild - Continue IV fluids

## 2024-11-09 NOTE — Assessment & Plan Note (Signed)
-   Supplement potassium °- Check magnesium °

## 2024-11-09 NOTE — Assessment & Plan Note (Signed)
 Patient exhibits signs of memory impairment.  States this is present since her stroke. - Strongly recommend outpatient Neuropsychiatric testing, given she lives alone

## 2024-11-09 NOTE — Assessment & Plan Note (Signed)
 Permanent.  On Pradaxa  - Continue Pradaxa  - Continue metoprolol 

## 2024-11-09 NOTE — Assessment & Plan Note (Signed)
 Ischemic due to ATN Cr 2.3 on admission from baseline 1.1.  Down to 1.8 today - Hold ARB - Continue IV fluids - Avoid hypotension and nephrotoxins - Strict I/Os

## 2024-11-09 NOTE — Assessment & Plan Note (Addendum)
 Patient unable to provide reliable description of this, but from PCP and Endo notes, consistently reported diarrhea to providers since early 2024.  Was referred to [facility] Dr. Amelie for this in June 2024, but at that appointment, noted both presented today for an initial evaluation for change in bowel habits with diarrhea but also has a bowel movements every 2 days only.  No comment by Dr. Amelie regarding diarrhea other than that.  In subsequent PCP notes, Patient reports they [GI Dr. Ruthann did not feel that her diarrhea and fecal urgency required more of a workup. Had colonoscopy in Dec 2024 that was unremarkable.  PCP subsequently started Welchol  without improvement.    Two months ago with PCP, Fecal lactoferrin elevated and pancreatic elastase low.  Cdiff and GI panel negative.  Patient unable to provide reliable report now of frequency of stools, but frequency appears to have gotten worse, leading to new acute acidosis and AKI and this admission.  Symptoms not consistent with IBS at this point (hospitalization).  Benign possibilities are that this is IBS with superimposed GI bug.  Could also be IBD given age, lactoferrin (although the red spots on shins are not raised).  Also likely are diabetic autonomic neuropathy or pancreatic insufficiency.  Will consult GI for accurate delineation of differential - Consult GI - Check TSH - Follow GI pathogen panel - IV fluids - If GI panel negative, will start Creon  and Lomotil

## 2024-11-09 NOTE — Progress Notes (Signed)
 Progress Note   Patient: Ann [name] FMW:[id number] DOB: 05-28-1951 DOA: 11/08/2024     1 DOS: the patient was seen and examined on 11/09/2024        Brief hospital course: 73 y.o. F with obesity, colon CA in 2001 s/p partial colectomy, IDDM, hx CVA, HTN, permAF on Pradaxa , carotid disease, and hypothyroidism who presented with hypotension.  Found to have AKI.     Assessment and Plan: * AKI (acute kidney injury) Ischemic due to ATN Cr 2.3 on admission from baseline 1.1.  Down to 1.8 today - Hold ARB - Continue IV fluids - Avoid hypotension and nephrotoxins - Strict I/Os    Diarrhea Patient unable to provide reliable description of this, but from PCP and Endo notes, consistently reported diarrhea to providers since early 2024.  Was referred to [facility] Dr. Amelie for this in June 2024, but at that appointment, noted both presented today for an initial evaluation for change in bowel habits with diarrhea but also has a bowel movements every 2 days only.  No comment by Dr. Amelie regarding diarrhea other than that.  In subsequent PCP notes, Patient reports they [GI Dr. Ruthann did not feel that her diarrhea and fecal urgency required more of a workup. Had colonoscopy in Dec 2024 that was unremarkable.  PCP subsequently started Welchol  without improvement.    Two months ago with PCP, Fecal lactoferrin elevated and pancreatic elastase low.  Cdiff and GI panel negative.  Patient unable to provide reliable report now of frequency of stools, but frequency appears to have gotten worse, leading to new acute acidosis and AKI and this admission.  Symptoms not consistent with IBS at this point (hospitalization).  Benign possibilities are that this is IBS with superimposed GI bug.  Could also be IBD given age, lactoferrin (although the red spots on shins are not raised).  Also likely are diabetic autonomic neuropathy or pancreatic insufficiency.  Will consult GI for accurate  delineation of differential - Consult GI - Check TSH - Follow GI pathogen panel - IV fluids - If GI panel negative, will start Creon and Lomotil    Cognitive impairment Patient exhibits signs of memory impairment.  States this is present since her stroke. - Strongly recommend outpatient Neuropsychiatric testing, given she lives alone  Hyponatremia Mild - Continue IV fluids  Acute metabolic acidosis Due to diarrhea and AKI - Continue IV fluids - Trend BMP  Hypokalemia - Supplement potassium - Check magnesium  History of colon cancer 2001, s/p colectomy, in remission, colonscopy last Dec unremarkable at [facility], diverticulosis only  History of CVA (cerebrovascular accident)-basal ganglia - Continue Pradaxax  Aortic atherosclerosis Peripheral vascular disease, carotid disease Statin intolerant, follows with endocrinology  Type 2 diabetes mellitus with hyperglycemia, with long-term current use of insulin  (HCC) Hyperglycemic here - Continue glargine - Continue SS corrections - Check A1c  Morbid obesity (HCC) History BMI >35 with comorbid HTN and diabetes.   - Hold Ozempic   Hypothyroidism - Continue LT4 - Check TSH  Hypertension BP still soft - Hold olmesartan , HCTZ - Okay to continue metoprolol  for now  Atrial fibrillation (HCC) Permanent.  On Pradaxa  - Continue Pradaxa  - Continue metoprolol   Normocytic anemia - Check iron studies, retics       Subjective: Patient feels weak and tired.  No fever, no confusion.  She still has generalized malaise.  She is unable to remember how many bowel movements she has had overnight.     Physical Exam: BP (S) 98/62  Pulse 87   Temp (!) 97.5 F (36.4 C)   Resp 18   Ht 5' 8 (1.727 m)   Wt 97.2 kg   SpO2 100%   BMI 32.58 kg/m   Obese adult female, lying in bed, appears weak and tired Irregularly irregular, rate controlled, no murmurs, no pitting edema Respiratory rate normal, lungs clear without rales  or wheezes Abdomen soft, no clear tenderness palpation, bowel sounds normal, no distention She has some scattered red nonpainful, blanching, nonraised red spots on the leg on the right.  No significant swelling.  No other rashes. Attentive, responds appropriately to questions, oriented to person, place, time, short-term memory seems impaired, face symmetric, upper extremity strength weak but symmetric    Data Reviewed: Discussed with GI Basic metabolic panel shows hypokalemia, nongap acidosis, improving creatinine CBC shows mild leukocytosis, mild anemia   Family Communication:     Disposition: Status is: Inpatient         Author: Lonni SHAUNNA Dalton, MD 11/09/2024 9:39 AM  For on call review [email or web address].

## 2024-11-09 NOTE — Assessment & Plan Note (Addendum)
 Hyperglycemic here - Continue glargine - Continue SS corrections - Check A1c

## 2024-11-09 NOTE — Assessment & Plan Note (Signed)
-   Continue Pradaxax

## 2024-11-09 NOTE — Assessment & Plan Note (Signed)
 History BMI >35 with comorbid HTN and diabetes.   - Hold Ozempic 

## 2024-11-09 NOTE — Telephone Encounter (Signed)
 Linda/Dottie: Please contact patient and schedule her for an office visit  with any physician or APP. She is currently admitted to [facility]. Dr. Jonel (hospitalist) communicated with Dr. Abran regarding this patient regarding chronic diarrhea. Dr. Abran recommended outpatient GI evaluation. THX.

## 2024-11-09 NOTE — Assessment & Plan Note (Signed)
 2001, s/p colectomy, in remission, colonscopy last Dec unremarkable at [facility], diverticulosis only

## 2024-11-09 NOTE — Assessment & Plan Note (Signed)
-   Continue LT4 - Check TSH

## 2024-11-09 NOTE — Assessment & Plan Note (Signed)
 BP still soft - Hold olmesartan , HCTZ - Okay to continue metoprolol  for now

## 2024-11-10 ENCOUNTER — Inpatient Hospital Stay (HOSPITAL_COMMUNITY)

## 2024-11-10 DIAGNOSIS — E785 Hyperlipidemia, unspecified: Secondary | ICD-10-CM | POA: Diagnosis not present

## 2024-11-10 DIAGNOSIS — E119 Type 2 diabetes mellitus without complications: Secondary | ICD-10-CM

## 2024-11-10 DIAGNOSIS — I160 Hypertensive urgency: Secondary | ICD-10-CM

## 2024-11-10 DIAGNOSIS — N179 Acute kidney failure, unspecified: Secondary | ICD-10-CM | POA: Diagnosis not present

## 2024-11-10 LAB — GLUCOSE, CAPILLARY
Glucose-Capillary: 151 mg/dL — ABNORMAL HIGH (ref 70–99)
Glucose-Capillary: 186 mg/dL — ABNORMAL HIGH (ref 70–99)
Glucose-Capillary: 139 mg/dL — ABNORMAL HIGH (ref 70–99)
Glucose-Capillary: 165 mg/dL — ABNORMAL HIGH (ref 70–99)

## 2024-11-10 LAB — BASIC METABOLIC PANEL WITH GFR
Anion gap: 8 (ref 5–15)
BUN: 35 mg/dL — ABNORMAL HIGH (ref 8–23)
CO2: 21 mmol/L — ABNORMAL LOW (ref 22–32)
Calcium: 8.3 mg/dL — ABNORMAL LOW (ref 8.9–10.3)
Chloride: 112 mmol/L — ABNORMAL HIGH (ref 98–111)
Creatinine, Ser: 1.37 mg/dL — ABNORMAL HIGH (ref 0.44–1.00)
GFR, Estimated: 40 mL/min — ABNORMAL LOW (ref >60)
Glucose, Bld: 225 mg/dL — ABNORMAL HIGH (ref 70–99)
Potassium: 4.6 mmol/L (ref 3.5–5.1)
Sodium: 141 mmol/L (ref 135–145)

## 2024-11-10 LAB — RETICULOCYTES
Immature Retic Fract: 10.4 % (ref 2.3–15.9)
RBC.: 3.77 MIL/uL — ABNORMAL LOW (ref 3.87–5.11)
Retic Count, Absolute: 128.2 K/uL (ref 19.0–186.0)
Retic Ct Pct: 3.4 % — ABNORMAL HIGH (ref 0.4–3.1)

## 2024-11-10 LAB — CBC
HCT: 36.1 % (ref 36.0–46.0)
Hemoglobin: 11.8 g/dL — ABNORMAL LOW (ref 12.0–15.0)
MCH: 31.5 pg (ref 26.0–34.0)
MCHC: 32.7 g/dL (ref 30.0–36.0)
MCV: 96.3 fL (ref 80.0–100.0)
Platelets: 203 K/uL (ref 150–400)
RBC: 3.75 MIL/uL — ABNORMAL LOW (ref 3.87–5.11)
RDW: 14.8 % (ref 11.5–15.5)
WBC: 9.2 K/uL (ref 4.0–10.5)
nRBC: 0 % (ref 0.0–0.2)

## 2024-11-10 LAB — IRON AND TIBC
Iron: 89 ug/dL (ref 28–170)
Saturation Ratios: 30 % (ref 10.4–31.8)
TIBC: 300 ug/dL (ref 250–450)
UIBC: 211 ug/dL

## 2024-11-10 LAB — FERRITIN: Ferritin: 333 ng/mL — ABNORMAL HIGH (ref 11–307)

## 2024-11-10 LAB — HEMOGLOBIN A1C
Hgb A1c MFr Bld: 8.4 % — ABNORMAL HIGH (ref 4.8–5.6)
Mean Plasma Glucose: 194 mg/dL

## 2024-11-10 LAB — TSH: TSH: 3.208 u[IU]/mL (ref 0.350–4.500)

## 2024-11-10 MED ORDER — ENSURE PLUS HIGH PROTEIN PO LIQD
237.0000 mL | Freq: Two times a day (BID) | ORAL | Status: DC
  Administered 2024-11-10 – 2024-11-11 (×3): 237 mL via ORAL

## 2024-11-10 NOTE — TOC Initial Note (Addendum)
 Transition of Care [facility]) - Initial/Assessment Note    Patient Details  Name: Ann [name] MRN: [id number] Date of Birth: 1951/07/08  Transition of Care [facility]) CM/SW Contact:    Lendia Dais, LCSWA Phone Number: 11/10/2024, 8:59 AM  Clinical Narrative:  Pt is from home alone w/ DME of a cane and a walker. Pt has seen PC Dr. Catherine in the last year. Pt is independent w/ ADL's and drives. Pt gave CSW permission to contact Jenkins (sister) for updates. Pt states that her sister resides in [place] and the rest of her family is in the [place] area.  Pt' source of income is retirement and SSI. Pt states that they have no SDOH concerns and are able to afford all basic needs.  Pt has no HH hx or MH/SU hx.  Pt is pending a PT/OT consult. CSW will monitor for PT/OT recs.                Expected Discharge Plan: Skilled Nursing Facility Barriers to Discharge: Continued Medical Work up   Patient Goals and CMS Choice Patient states their goals for this hospitalization and ongoing recovery are:: To remain independent          Expected Discharge Plan and Services In-house Referral: Clinical Social Work     Living arrangements for the past 2 months: Single Family Home                                      Prior Living Arrangements/Services Living arrangements for the past 2 months: Single Family Home Lives with:: Self   Do you feel safe going back to the place where you live?: Yes      Need for Family Participation in Patient Care: Yes (Comment) Care giver support system in place?: No (comment) Current home services: DME Criminal Activity/Legal Involvement Pertinent to Current Situation/Hospitalization: No - Comment as needed  Activities of Daily Living   ADL Screening (condition at time of admission) Independently performs ADLs?: Yes (appropriate for developmental age) Is the patient deaf or have difficulty hearing?: No Does the patient have difficulty seeing, even  when wearing glasses/contacts?: No Does the patient have difficulty concentrating, remembering, or making decisions?: No  Permission Sought/Granted Permission sought to share information with : Family Supports Permission granted to share information with : Yes, Verbal Permission Granted  Share Information with NAME: Jenkins Rein (sister)     Permission granted to share info w Relationship: Sister     Emotional Assessment Appearance:: Appears stated age Attitude/Demeanor/Rapport: Engaged Affect (typically observed): Appropriate, Pleasant Orientation: : Oriented to Self, Oriented to Place, Oriented to  Time, Oriented to Situation Alcohol / Substance Use: Not Applicable Psych Involvement: No (comment)  Admission diagnosis:  AKI (acute kidney injury) [N17.9] Hypotension, unspecified hypotension type [I95.9] Diarrhea, unspecified type [R19.7] Patient Active Problem List   Diagnosis Date Noted   History of colon cancer 11/09/2024   Hypokalemia 11/09/2024   Acute metabolic acidosis 11/09/2024   Hyponatremia 11/09/2024   AKI (acute kidney injury) 11/08/2024   Diarrhea 08/29/2024   Unintentional weight loss 08/29/2024   Expressive aphasia 08/24/2023   History of CVA (cerebrovascular accident)-basal ganglia 08/24/2023   Vertebral artery occlusion, left 07/24/2023   Subclavian artery stenosis 07/24/2023   Left carotid artery stenosis 07/24/2023   Internal carotid artery stenosis, bilateral-25% right and 50% left proximal ICA stenosis 07/24/2023   Aortic atherosclerosis 07/23/2023  Coronary artery calcification of native artery 07/23/2023   Cardiomegaly 07/23/2023   Thiamine  deficiency 07/13/2023   FH: stroke 07/09/2023   Vitamin D  deficiency 07/09/2023   Cognitive impairment 07/09/2023   Type 2 diabetes mellitus with hyperglycemia, with long-term current use of insulin  (HCC) 08/25/2022   Morbid obesity (HCC) 09/12/2021   Elevated bilirubin 03/22/2021   Gout 07/23/2020   Sleep  disturbance 04/20/2020   Anxiety 04/20/2020   Atrial fibrillation (HCC)    Type 2 diabetes mellitus with hyperlipidemia (HCC)    Hypertension    Hypothyroidism    Chronic vulvitis 11/13/2018   OA (osteoarthritis) of knee 01/24/2016   Mitral valve disease 08/16/2012   PCP:  Catherine Charlies LABOR, DO Pharmacy:   [facility] - [place], [place] - [address] [place] [address] [place] [address] [place] [address] Phone: [phone] Fax: [phone]  [facility] [phone] [place], [place] - [address] [place] [address] AT [facility] [address] [place] [address] Phone: [phone] Fax: [phone]  [facility] [place], [place] - [address] [facility] AT Portal to [facility] [address] [place] [address] Phone: [phone] Fax: [phone]     Social Drivers of Health (SDOH) Social History: SDOH Screenings   Food Insecurity: No Food Insecurity (11/08/2024)  Housing: Low Risk  (11/08/2024)  Transportation Needs: No Transportation Needs (11/08/2024)  Utilities: Not At Risk (11/08/2024)  Alcohol Screen: Low Risk  (07/16/2024)  Depression (PHQ2-9): Low Risk  (07/16/2024)  Financial Resource Strain: Low Risk  (07/16/2024)  Physical Activity: Inactive (07/16/2024)  Social Connections: Moderately Isolated (11/08/2024)  Stress: No Stress Concern Present (07/16/2024)  Tobacco Use: Medium Risk (11/08/2024)  Health Literacy: Adequate Health Literacy (07/16/2024)   SDOH Interventions:     Readmission Risk Interventions     No data to display

## 2024-11-10 NOTE — Progress Notes (Signed)
    Durable Medical Equipment  (From admission, onward)           Start     Ordered   11/10/24 1639  For home use only DME Bedside commode  Once       Comments: Patient is confined to one room, has Generalized Weakness and Decreased Activity Tolerance which necessitate recommendation for bedside commode.  Question:  Patient needs a bedside commode to treat with the following condition  Answer:  Weakness   11/10/24 1639

## 2024-11-10 NOTE — Evaluation (Signed)
 Occupational Therapy Evaluation Patient Details Name: Ann [name] MRN: [id number] DOB: 09-11-1951 Today's Date: 11/10/2024   History of Present Illness   73 yo female admitted with AKI and hypotension. PMH hx colon CA with partial colectomy , hypokalemia, hyonatremia, expressive aphasia, CVA 08/24/23, cognitive impairments, DM2, anxiety, sleep disturbance, atrial fib, HTN, OA, mitral valve disease.     Clinical Impressions Alveta was evaluated s/p the above admission list. She lives alone and is mod I with rollator use at baseline; she does report 3 recent falls. Upon evaluation the pt was limited by bowel and bladder urgency and decreased activity tolerance. Overall she needed generalized supervision A for all mobility without AD in the room. Due to the deficits listed below the pt also needs up to min A for ADLs and total A to don her compression stockings. Pt will benefit from continued acute OT services and HHOT.  Orthostatic BPs Supine 114/63  Sitting 120/81  Standing 125/74  Standing after 3 min 123/61        If plan is discharge home, recommend the following:   A little help with walking and/or transfers;A little help with bathing/dressing/bathroom;Assistance with cooking/housework;Assist for transportation     Functional Status Assessment   Patient has had a recent decline in their functional status and demonstrates the ability to make significant improvements in function in a reasonable and predictable amount of time.     Equipment Recommendations   BSC/3in1      Precautions/Restrictions   Precautions Precautions: Fall Restrictions Weight Bearing Restrictions Per Provider Order: No     Mobility Bed Mobility Overal bed mobility: Needs Assistance Bed Mobility: Supine to Sit, Sit to Supine     Supine to sit: Supervision Sit to supine: Min assist   General bed mobility comments: +increased time    Transfers Overall transfer level: Needs  assistance Equipment used: None Transfers: Sit to/from Stand Sit to Stand: Supervision           General transfer comment: no AD      Balance                                           ADL either performed or assessed with clinical judgement   ADL Overall ADL's : Needs assistance/impaired Eating/Feeding: Independent   Grooming: Supervision/safety;Standing   Upper Body Bathing: Set up;Sitting   Lower Body Bathing: Minimal assistance;Sit to/from stand   Upper Body Dressing : Set up;Sitting   Lower Body Dressing: Minimal assistance;Sit to/from stand Lower Body Dressing Details (indicate cue type and reason): total A for compression stockings Toilet Transfer: Supervision/safety;Ambulation Toilet Transfer Details (indicate cue type and reason): BSC over toilet Toileting- Clothing Manipulation and Hygiene: Modified independent;Sitting/lateral lean       Functional mobility during ADLs: Supervision/safety General ADL Comments: BP stable throughout, assist for safety     Vision Baseline Vision/History: 0 No visual deficits Vision Assessment?: No apparent visual deficits     Perception Perception: Within Functional Limits       Praxis Praxis: WFL       Pertinent Vitals/Pain Pain Assessment Pain Assessment: No/denies pain     Extremity/Trunk Assessment Upper Extremity Assessment Upper Extremity Assessment: Generalized weakness   Lower Extremity Assessment Lower Extremity Assessment: Defer to PT evaluation   Cervical / Trunk Assessment Cervical / Trunk Assessment: Normal   Communication Communication Communication: No apparent difficulties  Cognition Arousal: Alert Behavior During Therapy: WFL for tasks assessed/performed Cognition: No apparent impairments                               Following commands: Intact       Cueing  General Comments      VSS, BP stable   Exercises     Shoulder Instructions       Home Living Family/patient expects to be discharged to:: Private residence Living Arrangements: Alone Available Help at Discharge: Friend(s);Available PRN/intermittently Type of Home: House Home Access: Stairs to enter [employer] of Steps: 3 Entrance Stairs-Rails: Right Home Layout: Two level;Able to live on main level with bedroom/bathroom     Bathroom Shower/Tub: Producer, Television/film/video: Handicapped height     Home Equipment: Cane - single point;Rollator (4 wheels);Grab bars - tub/shower          Prior Functioning/Environment Prior Level of Function : Independent/Modified Independent;Driving             Mobility Comments: ambulates with rollator, reports 3 recent falls ADLs Comments: reports mod I, drives, cooks, etc    OT Problem List: Decreased activity tolerance;Impaired balance (sitting and/or standing)   OT Treatment/Interventions: Self-care/ADL training;Therapeutic exercise;DME and/or AE instruction;Therapeutic activities;Energy conservation;Patient/family education;Balance training      OT Goals(Current goals can be found in the care plan section)   Acute Rehab OT Goals Patient Stated Goal: home OT Goal Formulation: With patient Time For Goal Achievement: 11/24/24 Potential to Achieve Goals: Good ADL Goals Additional ADL Goal #1: pt will complete all ADLs with mod I adn LRAD   OT Frequency:  Min 2X/week    Co-evaluation              AM-PAC OT 6 Clicks Daily Activity     Outcome Measure Help from another person eating meals?: None Help from another person taking care of personal grooming?: A Little Help from another person toileting, which includes using toliet, bedpan, or urinal?: A Little Help from another person bathing (including washing, rinsing, drying)?: A Little Help from another person to put on and taking off regular upper body clothing?: A Little Help from another person to put on and taking off regular  lower body clothing?: A Little 6 Click Score: 19   End of Session Equipment Utilized During Treatment: Gait belt Nurse Communication: Mobility status  Activity Tolerance: Patient tolerated treatment well Patient left: in bed;with call bell/phone within reach  OT Visit Diagnosis: Repeated falls (R29.6);Muscle weakness (generalized) (M62.81);History of falling (Z91.81)                Time: 0920-1000 OT Time Calculation (min): 40 min Charges:  OT General Charges $OT Visit: 1 Visit OT Evaluation $OT Eval Moderate Complexity: 1 Mod OT Treatments $Self Care/Home Management : 8-22 mins $Therapeutic Activity: 8-22 mins  Lucie Kendall, OTR/L Acute Rehabilitation Services Office [phone] Secure Chat Communication Preferred   Lucie JONETTA Kendall 11/10/2024, 10:29 AM

## 2024-11-10 NOTE — Evaluation (Signed)
 Physical Therapy Evaluation Patient Details Name: Ann [name] MRN: [id number] DOB: 1951/11/21 Today's Date: 11/10/2024  History of Present Illness  73 yo female admitted with AKI and hypotension. PMH hx colon CA with partial colectomy , hypokalemia, hyonatremia, expressive aphasia, CVA 08/24/23, cognitive impairments, DM2, anxiety, sleep disturbance, atrial fib, HTN, OA, mitral valve disease.  Clinical Impression  Pt presents to PT with deficits in activity tolerance, strength, power, gait, balance. Pt is able to ambulate for household distances with support of a rollator. PT notes R foot drag with increased ambulation distance and fatigue, pt is aware of this when mobilizing. Pt is encouraged to mobilize frequently with staff assistance in an effort to improve activity tolerance. PT recommends discharge home with HHPT when medically appropriate.        If plan is discharge home, recommend the following: A little help with bathing/dressing/bathroom;Assistance with cooking/housework;Assist for transportation;Help with stairs or ramp for entrance   Can travel by private vehicle        Equipment Recommendations None recommended by PT  Recommendations for Other Services       Functional Status Assessment Patient has had a recent decline in their functional status and demonstrates the ability to make significant improvements in function in a reasonable and predictable amount of time.     Precautions / Restrictions Precautions Precautions: Fall Restrictions Weight Bearing Restrictions Per Provider Order: No      Mobility  Bed Mobility Overal bed mobility: Needs Assistance Bed Mobility: Supine to Sit     Supine to sit: Supervision          Transfers Overall transfer level: Needs assistance Equipment used: Rollator (4 wheels) Transfers: Sit to/from Stand Sit to Stand: Supervision                Ambulation/Gait Ambulation/Gait assistance: Supervision Gait  Distance (Feet): 250 Feet Assistive device: Rollator (4 wheels) Gait Pattern/deviations: Step-through pattern, Decreased dorsiflexion - right Gait velocity: reduced Gait velocity interpretation: 1.31 - 2.62 ft/sec, indicative of limited community ambulator   General Gait Details: pt with step-through gait, PT notes R foot drag during final 75' of ambulation when fatigued. Pt is aware of the foot drag  Stairs            Wheelchair Mobility     Tilt Bed    Modified Rankin (Stroke Patients Only)       Balance Overall balance assessment: Needs assistance Sitting-balance support: No upper extremity supported, Feet supported Sitting balance-Leahy Scale: Good     Standing balance support: Single extremity supported, Reliant on assistive device for balance Standing balance-Leahy Scale: Poor                               Pertinent Vitals/Pain Pain Assessment Pain Assessment: No/denies pain    Home Living Family/patient expects to be discharged to:: Private residence Living Arrangements: Alone Available Help at Discharge: Other (Comment) (pt reports no caregiver support to PT, reported availability of a friend recently this morning to OT) Type of Home: House Home Access: Stairs to enter Entrance Stairs-Rails: Right;Left Entrance Stairs-Number of Steps: 3   Home Layout: Two level;Able to live on main level with bedroom/bathroom Home Equipment: Cane - single point;Rollator (4 wheels);Grab bars - tub/shower;Shower seat - built in      Prior Function Prior Level of Function : Independent/Modified Independent;Driving             Mobility Comments: ambulates  with rollator, reports 3 recent falls. Pt was ambulating with a cane and then began utilizing the rollator after falls began. Pt reports one fall when utilizing rollator to stand up ADLs Comments: reports mod I, drives, cooks, etc     Extremity/Trunk Assessment   Upper Extremity Assessment Upper  Extremity Assessment: Generalized weakness    Lower Extremity Assessment Lower Extremity Assessment: Generalized weakness (RLE foot drag noted with fatigue during ambulation)    Cervical / Trunk Assessment Cervical / Trunk Assessment: Normal  Communication   Communication Communication: No apparent difficulties    Cognition Arousal: Alert Behavior During Therapy: WFL for tasks assessed/performed   PT - Cognitive impairments: No family/caregiver present to determine baseline                       PT - Cognition Comments: pt appears to have intermittent word finding difficulties Following commands: Intact       Cueing Cueing Techniques: Verbal cues     General Comments General comments (skin integrity, edema, etc.): VSS on RA    Exercises     Assessment/Plan    PT Assessment Patient needs continued PT services  PT Problem List Decreased strength;Decreased activity tolerance;Decreased balance;Decreased mobility;Decreased knowledge of use of DME       PT Treatment Interventions DME instruction;Gait training;Stair training;Functional mobility training;Therapeutic activities;Therapeutic exercise;Balance training;Neuromuscular re-education;Patient/family education    PT Goals (Current goals can be found in the Care Plan section)  Acute Rehab PT Goals Patient Stated Goal: to return home, improve balance PT Goal Formulation: With patient Time For Goal Achievement: 11/24/24 Potential to Achieve Goals: Good    Frequency Min 2X/week     Co-evaluation               AM-PAC PT 6 Clicks Mobility  Outcome Measure Help needed turning from your back to your side while in a flat bed without using bedrails?: A Little Help needed moving from lying on your back to sitting on the side of a flat bed without using bedrails?: A Little Help needed moving to and from a bed to a chair (including a wheelchair)?: A Little Help needed standing up from a chair using your  arms (e.g., wheelchair or bedside chair)?: A Little Help needed to walk in hospital room?: A Little Help needed climbing 3-5 steps with a railing? : A Little 6 Click Score: 18    End of Session Equipment Utilized During Treatment: Gait belt Activity Tolerance: Patient tolerated treatment well Patient left: in bed;with call bell/phone within reach;with bed alarm set Nurse Communication: Mobility status PT Visit Diagnosis: Other abnormalities of gait and mobility (R26.89);History of falling (Z91.81);Muscle weakness (generalized) (M62.81)    Time: 8948-8885 PT Time Calculation (min) (ACUTE ONLY): 23 min   Charges:   PT Evaluation $PT Eval Low Complexity: 1 Low   PT General Charges $$ ACUTE PT VISIT: 1 Visit         Bernardino JINNY Ruth, PT, DPT Acute Rehabilitation Office [phone]   Bernardino JINNY Ruth 11/10/2024, 11:30 AM

## 2024-11-10 NOTE — TOC Progression Note (Signed)
 Transition of Care [facility]) - Progression Note    Patient Details  Name: Ann [name] MRN: [id number] Date of Birth: August 25, 1951  [facility] CM/SW Contact  Tom-Johnson, Truong Delcastillo Daphne, RN Phone Number: 11/10/2024, 4:42 PM  Clinical Narrative:     CM spoke with patient at bedside about home health recommendations,patient has no preference. CM sent referral via hub to [facility] with acceptance noted, info on AVS.  Patient not Medically ready for discharge.  CM will continue to follow as patient progresses with care towards discharge.        Expected Discharge Plan: Skilled Nursing Facility Barriers to Discharge: Continued Medical Work up               Expected Discharge Plan and Services In-house Referral: Clinical Social Work     Living arrangements for the past 2 months: Single Family Home                                       Social Drivers of Health (SDOH) Interventions SDOH Screenings   Food Insecurity: No Food Insecurity (11/08/2024)  Housing: Low Risk  (11/08/2024)  Transportation Needs: No Transportation Needs (11/08/2024)  Utilities: Not At Risk (11/08/2024)  Alcohol Screen: Low Risk  (07/16/2024)  Depression (PHQ2-9): Low Risk  (07/16/2024)  Financial Resource Strain: Low Risk  (07/16/2024)  Physical Activity: Inactive (07/16/2024)  Social Connections: Moderately Isolated (11/08/2024)  Stress: No Stress Concern Present (07/16/2024)  Tobacco Use: Medium Risk (11/08/2024)  Health Literacy: Adequate Health Literacy (07/16/2024)    Readmission Risk Interventions     No data to display

## 2024-11-10 NOTE — Progress Notes (Signed)
  Progress Note   Patient: Ann [name] FMW:[id number] DOB: 02/09/1951 DOA: 11/08/2024     2 DOS: the patient was seen and examined on 11/10/2024        Brief hospital course: 73 y.o. F with obesity, colon CA in 2001 s/p partial colectomy, IDDM, hx CVA, HTN, permAF on Pradaxa , carotid disease, and hypothyroidism who presented with hypotension.  Found to have AKI.     Assessment and Plan: * AKI (acute kidney injury) Ischemic due to ATN Cr 2.3 on admission from baseline 1.1.   Creatinine improving - Hold ARB - Continue IVF - Hold ARB      Diarrhea SAee summary from 11/30 Follows with [facility] TSH normal - Continue Creon, Lomotil     Cognitive impairment See summary from 11/30 - Outpatient neuropsych testing  Hyponatremia Resolved with fluids  Acute metabolic acidosis Resolved with fluids  Hypokalemia Resolved    History of CVA (cerebrovascular accident)-basal ganglia -Continue dabigatran   Aortic atherosclerosis See prior summary  Type 2 diabetes mellitus with hyperglycemia, with long-term current use of insulin  (HCC) Glucoses improved - Continue sliding scale corrections, glargine - Follow A1c  Morbid obesity (HCC) History BMI >35 with comorbid HTN and diabetes.   - Hold Ozempic   Hypothyroidism -Continue levothyroxine   Hypertension Blood pressure low normal - Hold olmesartan  and HCTZ - Continue metoprolol   Atrial fibrillation (HCC) On dabigatran , permanent atrial fibrillation - Continue Pradaxa , metoprolol           Subjective: Patient is feeling still weak and tired, still having loose poops.  No fever, confusion.  No hematochezia or melena     Physical Exam: BP 118/68 (BP Location: Right Arm)   Pulse 67   Temp 97.6 F (36.4 C) (Oral)   Resp 18   Ht 5' 8 (1.727 m)   Wt 97.2 kg   SpO2 90%   BMI 32.58 kg/m   Adult female, lying in bed, interactive and appropriate RRR, no murmurs, no peripheral edema Respiratory  normal, lungs clear without rales or wheezes Abdomen soft, no tenderness to palpation , No ascites or distention Attention normal, affect normal, judgment insight appear normal    Data Reviewed: Basic metabolic panel shows resolved hyponatremia, hypokalemia, acidosis.  BUN still 35 and creatinine 1.37 Basic metabolic panel shows mild anemia Discussed with GI   Family Communication: Sister by phone    Disposition: Status is: Inpatient         Author: Lonni SHAUNNA Dalton, MD 11/10/2024 4:34 PM  For on call review [email or web address].

## 2024-11-10 NOTE — Progress Notes (Signed)
 BUE arterial duplex has been completed.   Results can be found under chart review under CV PROC. 11/10/2024 11:38 AM Jamara Vary RVT, RDMS

## 2024-11-10 NOTE — Telephone Encounter (Signed)
 Patient is scheduled for office visit on 12/05/24 at 3 pm with Dr Abran.   Colleen-can you make patient aware of appointment since she is inpatient at this time?

## 2024-11-11 ENCOUNTER — Telehealth (HOSPITAL_COMMUNITY): Payer: Self-pay | Admitting: Pharmacy Technician

## 2024-11-11 ENCOUNTER — Other Ambulatory Visit (HOSPITAL_COMMUNITY): Payer: Self-pay

## 2024-11-11 ENCOUNTER — Encounter: Payer: Self-pay | Admitting: Cardiology

## 2024-11-11 DIAGNOSIS — N179 Acute kidney failure, unspecified: Secondary | ICD-10-CM | POA: Diagnosis not present

## 2024-11-11 LAB — BASIC METABOLIC PANEL WITH GFR
Anion gap: 9 (ref 5–15)
BUN: 30 mg/dL — ABNORMAL HIGH (ref 8–23)
CO2: 19 mmol/L — ABNORMAL LOW (ref 22–32)
Calcium: 8.1 mg/dL — ABNORMAL LOW (ref 8.9–10.3)
Chloride: 116 mmol/L — ABNORMAL HIGH (ref 98–111)
Creatinine, Ser: 1.1 mg/dL — ABNORMAL HIGH (ref 0.44–1.00)
GFR, Estimated: 52 mL/min — ABNORMAL LOW (ref >60)
Glucose, Bld: 74 mg/dL (ref 70–99)
Potassium: 4.3 mmol/L (ref 3.5–5.1)
Sodium: 144 mmol/L (ref 135–145)

## 2024-11-11 LAB — CBC
HCT: 31.6 % — ABNORMAL LOW (ref 36.0–46.0)
Hemoglobin: 10.2 g/dL — ABNORMAL LOW (ref 12.0–15.0)
MCH: 31 pg (ref 26.0–34.0)
MCHC: 32.3 g/dL (ref 30.0–36.0)
MCV: 96 fL (ref 80.0–100.0)
Platelets: 143 K/uL — ABNORMAL LOW (ref 150–400)
RBC: 3.29 MIL/uL — ABNORMAL LOW (ref 3.87–5.11)
RDW: 14.6 % (ref 11.5–15.5)
WBC: 7.9 K/uL (ref 4.0–10.5)
nRBC: 0 % (ref 0.0–0.2)

## 2024-11-11 LAB — GLUCOSE, CAPILLARY: Glucose-Capillary: 95 mg/dL (ref 70–99)

## 2024-11-11 NOTE — TOC Transition Note (Signed)
 Transition of Care [facility]) - Discharge Note   Patient Details  Name: Ann [name] MRN: [id number] Date of Birth: March 09, 1951  Transition of [facility] CM/SW Contact:  Tom-Johnson, Harvest Muskrat, RN Phone Number: 11/11/2024, 10:26 AM   Clinical Narrative:     Patient is scheduled for discharge today.  Readmission Risk Assessment done. Home health info, hospital f/u and discharge instructions on AVS. BSC ordered, patient declined, states she does not need it now, will purchase if she does. Sister, Deland to transport at discharge.  No further ICM needs noted.      Final next level of care: Home w Home Health Services Barriers to Discharge: Barriers Resolved   Patient Goals and CMS Choice Patient states their goals for this hospitalization and ongoing recovery are:: To return home CMS Medicare.gov Compare Post Acute Care list provided to:: Patient        Discharge Placement                Patient to be transferred to facility by: Sister Name of family member notified: [facility]    Discharge Plan and Services Additional resources added to the After Visit Summary for   In-house Referral: Clinical Social Work              DME Arranged: Arts development officer (Patient declined)         HH Arranged: PT, OT, Nurse's Aide, RN HH Agency: [facility] Date HH Agency Contacted: 11/10/24 Time HH Agency Contacted: [id number] Representative spoke with at [facility] Agency: [facility]  Social Drivers of Health (SDOH) Interventions SDOH Screenings   Food Insecurity: No Food Insecurity (11/08/2024)  Housing: Low Risk  (11/08/2024)  Transportation Needs: No Transportation Needs (11/08/2024)  Utilities: Not At Risk (11/08/2024)  Alcohol Screen: Low Risk  (07/16/2024)  Depression (PHQ2-9): Low Risk  (07/16/2024)  Financial Resource Strain: Low Risk  (07/16/2024)  Physical Activity: Inactive (07/16/2024)  Social Connections: Moderately Isolated (11/08/2024)  Stress: No Stress  Concern Present (07/16/2024)  Tobacco Use: Medium Risk (11/08/2024)  Health Literacy: Adequate Health Literacy (07/16/2024)     Readmission Risk Interventions    11/11/2024   10:07 AM  Readmission Risk Prevention Plan  Transportation Screening Complete  PCP or Specialist Appt within 5-7 Days Complete  Home Care Screening Complete  Medication Review (RN CM) Referral to Pharmacy

## 2024-11-11 NOTE — Discharge Summary (Signed)
 Physician Discharge Summary   Patient: Ann [name] MRN: [id number] DOB: 04-13-1951  Admit date:     11/08/2024  Discharge date: 11/11/24  Discharge Physician: Lonni SHAUNNA Dalton   PCP: Catherine Charlies LABOR, DO     Recommendations at discharge:  Follow up with [facility] within 2 weeks for diarrhea Follow up with PCP Dr. Catherine in 1 week Dr. Catherine: Please check BMP and BP and resume olmesartan -hydrochlorothiazide  if appropriate Please refer for cognitive testing given concerns for memory impairment     Discharge Diagnoses: Principal Problem:   AKI (acute kidney injury) Active Problems:   Diarrhea   Cognitive impairment   Atrial fibrillation (HCC)   Hypertension   Hypothyroidism   Morbid obesity (HCC)   Type 2 diabetes mellitus with hyperglycemia, with long-term current use of insulin  (HCC)   Aortic atherosclerosis   History of CVA (cerebrovascular accident)-basal ganglia   History of colon cancer   Hypokalemia   Acute metabolic acidosis   Hyponatremia      Hospital Course: 73 y.o. F with obesity, colon CA in 2001 s/p partial colectomy, IDDM, hx CVA, HTN, permAF on Pradaxa , carotid disease, and hypothyroidism who presented with hypotension.  Found to have AKI.      * AKI (acute kidney injury) Ischemic due to ATN Cr 2.3 on admission from baseline 1.1.    Olmesartan -hydrochlorothiazide  held, given fluids, Cr returned to normal.  ARB/diuretic held at discharge, recommend PCP follow up for resumption.     Diarrhea Patient unable to provide reliable description of this, but from PCP and Endo notes, consistently reported diarrhea to providers since early 2024.  Was referred to [facility] Dr. Amelie for this in June 2024, but at that appointment, noted both presented today for an initial evaluation for change in bowel habits with diarrhea but also has a bowel movements every 2 days only.  No comment by Dr. Amelie regarding diarrhea other than that.  In  subsequent PCP notes, Patient reports they [GI Dr. Ruthann did not feel that her diarrhea and fecal urgency required more of a workup. Had colonoscopy in Dec 2024 that was unremarkable.  PCP subsequently started Welchol  without improvement.  Two months ago with PCP, Fecal lactoferrin elevated and pancreatic elastase low.  Cdiff and GI panel negative.  Patient believes she saw a GI in September, but first describes seeing Dr. Amelie again this past Sept, then reports seeing a new GI, but can't report where.  Eventually, able to determine this was [facility].  Patient unable to provide reliable report now of frequency of stools, but frequency appears to have gotten worse, leading to new acute acidosis and AKI and this admission.  Cdiff and GI pathogen panel here again negative.  IBS does not lead to hospitalization, this is ruled out.  TSH normal.  Patient reports no improvement with Creon or Zenpep, doubt this is pancreatic insufficiency alone.  Diabetic neuropathy related diarrhea seems more likely, in setting of A1c 8.4%.  I'm not familiar with Ozempic  diarrhea.  Patient refused Creon or pharmacy assistance with patient assistance program for Creon at discharge.  Refused imodium.  Recommend stool diary, close follow up with GI.        Cognitive impairment Patient exhibits signs of memory impairment.  States this is present since her stroke.  Sister confirmed. - Strongly recommend outpatient Neuropsychiatric testing, given she lives alone  Hyponatremia Resolved with fluids  Acute metabolic acidosis Due to diarrhea and AKI, resolved with fluids.  Hypokalemia Supplemented and resolved.  History of colon cancer 2001, s/p colectomy, in remission, colonscopy last Dec unremarkable at [facility], diverticulosis only  History of CVA (cerebrovascular accident)-basal ganglia Stable on Pradaxa , no bleeding noted.  Aortic atherosclerosis Peripheral vascular disease, carotid  disease Statin intolerant, follows with endocrinology  Type 2 diabetes mellitus with hyperglycemia, with long-term current use of insulin  (HCC) Hyperglycemic here, A1c 8.4%  Morbid obesity (HCC) History BMI >35 with comorbid HTN and diabetes.    Hypothyroidism TSH normal  Hypertension BP normal off meds.  Hold olmesartan , hydrochlorothiazide  at discharge.  Close PCP follow up.     Atrial fibrillation (HCC) Permanent.  On Pradaxa            [place]  Controlled Substances Registry was reviewed for this patient prior to discharge.  Consultants: None Procedures performed: None  Disposition: Home health   DISCHARGE MEDICATION: Allergies as of 11/11/2024       Reactions   Amoxicillin Shortness Of Breath, Swelling, Anaphylaxis   Has patient had a PCN reaction causing immediate rash, facial/tongue/throat swelling, SOB or lightheadedness with hypotension: Yes Has patient had a PCN reaction causing severe rash involving mucus membranes or skin necrosis: No Has patient had a PCN reaction that required hospitalization No Has patient had a PCN reaction occurring within the last 10 years: Yes If all of the above answers are NO, then may proceed with Cephalosporin use. Has patient had a PCN reaction causing immediate rash, facial/tongue/throat swelling, SOB or lightheadedness with hypotension: Yes Has patient had a PCN reaction causing severe rash involving mucus membranes or skin necrosis: No Has patient had a PCN reaction that required hospitalization No Has patient had a PCN reaction occurring within the last 10 years: Yes If all of the above answers are NO, then may proceed with Cephalosporin use. Has patient had a PCN reaction causing immediate rash, facial/tongue/throat swelling, SOB or lightheadedness with hypotension: Yes Has patient had a PCN reaction causing severe rash involving mucus membranes or skin necrosis: No Has patient had a PCN reaction that required  hospitalization No Has patient had a PCN reaction occurring within the last 10 years: Yes If all of the above answers are NO, then may proceed with Cephalosporin use.   Ampicillin    Codeine Itching   Tolerates hydrocodone    Ezetimibe    Livalo [pitavastatin]    Statins Other (See Comments)   Muscle skeletal side effects        Medication List     PAUSE taking these medications    olmesartan -hydrochlorothiazide  40-25 MG tablet Wait to take this until your doctor or other care provider tells you to start again. Commonly known as: BENICAR  HCT Take 1 tablet by mouth daily.       TAKE these medications    allopurinol  100 MG tablet Commonly known as: ZYLOPRIM  Take 1 tablet (100 mg total) by mouth daily.   ALPRAZolam  0.25 MG tablet Commonly known as: XANAX  Take 1 tablet (0.25 mg total) by mouth daily as needed for anxiety.   amitriptyline  25 MG tablet Commonly known as: ELAVIL  Take 1-3 tablets (25-75 mg total) by mouth at bedtime.   Basaglar  KwikPen 100 UNIT/ML Inject 46 Units into the skin every morning.   colchicine  0.6 MG tablet 2 tabs at onset of flare, followed by 1 tab 2 hours later. Then continue 1 tab daily until flare is resolved.   cyanocobalamin  1000 MCG/ML injection Commonly known as: VITAMIN B12 Inject 1 mL (1,000 mcg total) into the  muscle every 14 (fourteen) days.   dabigatran  150 MG Caps capsule Commonly known as: PRADAXA  Take 1 capsule (150 mg total) by mouth 2 (two) times daily.   Dexcom G7 Sensor Misc 1 DEVICE BY DOES NOT APPLY ROUTE AS DIRECTED.   EpiPen 2-Pak 0.3 MG/0.3ML Soaj injection Generic drug: EPINEPHrine Inject 0.3 mg into the skin as needed. Bee stings   Insulin  Pen Needle 30G X 6 MM Misc 1 Device by Does not apply route in the morning, at noon, in the evening, and at bedtime.   levothyroxine  50 MCG tablet Commonly known as: SYNTHROID  Take 1 tablet (50 mcg total) by mouth daily before breakfast.   metoprolol  tartrate 50 MG  tablet Commonly known as: LOPRESSOR  Take 1 tablet (50 mg total) by mouth 2 (two) times daily.   NovoLOG  FlexPen 100 UNIT/ML FlexPen Generic drug: insulin  aspart Max daily 60 units   OneTouch Ultra test strip Generic drug: glucose blood SMARTSIG:Via Meter   PRESCRIPTION MEDICATION Inject 50,000 mcg as directed every 14 (fourteen) days. Vitamin B12 injections   triamcinolone  ointment 0.5 % Commonly known as: KENALOG  Apply topically 2 (two) times daily as needed.   Vitamin D  (Ergocalciferol ) 1.25 MG (50000 UNIT) Caps capsule Commonly known as: DRISDOL  TAKE 1 CAPSULE BY MOUTH ON MONDAY AND THURSDAY WITH FOOD   zolpidem  5 MG tablet Commonly known as: AMBIEN  Take 1 tablet (5 mg total) by mouth at bedtime as needed.               Durable Medical Equipment  (From admission, onward)           Start     Ordered   11/10/24 1639  For home use only DME Bedside commode  Once       Comments: Patient is confined to one room, has Generalized Weakness and Decreased Activity Tolerance which necessitate recommendation for bedside commode.  Question:  Patient needs a bedside commode to treat with the following condition  Answer:  Weakness   11/10/24 1639            Contact information for follow-up providers     Kuneff, Renee A, DO. Schedule an appointment as soon as possible for a visit in 1 week(s).   Specialty: Family Medicine Contact information: [address] [place] [address] [phone]         Gastroenterology, Margarete Follow up.   Contact information: [address] [place] [address] [phone]              Contact information for after-discharge care     Home Medical Care     [facility]) Follow up.   Service: Home Health Services Why: Someone will call you to schedule first home visit. Contact information: [address] [place]  [address] [phone]                      Discharge Instructions     Discharge instructions   Complete by: As directed    **IMPORTANT DISCHARGE INSTRUCTIONS**   From Dr. Jonel: You were admitted for kidney failure This was temporary and resolved with IV fluids and holding your olmesartan -hydrochlorothiazide   Hold (do not take) olmesartan -hydrochlorothiazide  for now Go see Dr. Catherine in 1 week Have her check your blood pressure and kidney function and discuss with her whether to restart it Also ask her for a referral for memory testing  Go see Margarete  GI Erika Flipping within 2 weeks  Record a careful diary of every bowel movement you have between now and when you see the GI team  Also let them know what medicines you take (or don't take) for the diarrhea  Resume your other home medicines   Increase activity slowly   Complete by: As directed        Discharge Exam: Filed Weights   11/09/24 0659  Weight: 97.2 kg    General: Pt is alert, awake, not in acute distress Cardiovascular: RRR, nl S1-S2, no murmurs appreciated.   No LE edema.   Respiratory: Normal respiratory rate and rhythm.  CTAB without rales or wheezes. Abdominal: Abdomen soft and non-tender.  No distension or HSM.   Neuro/Psych: Strength symmetric in upper and lower extremities.  Judgment and insight appear normal.   Condition at discharge: fair  The results of significant diagnostics from this hospitalization (including imaging, microbiology, ancillary and laboratory) are listed below for reference.   Imaging Studies: VAS US  UPPER EXTREMITY ARTERIAL DUPLEX Result Date: 11/10/2024  UPPER EXTREMITY DUPLEX STUDY Patient Name:  Ann [name] [facility]  Date of Exam:   11/10/2024 Medical Rec #: [id number]          Accession #:    [id number] Date of Birth: [birth year]           Patient Gender: F Patient Age:   33 years Exam Location:  [facility] Procedure:      VAS US  UPPER EXTREMITY ARTERIAL DUPLEX Referring Phys: LONNI  Tian Davison --------------------------------------------------------------------------------  Risk Factors:  Hypertension, hyperlipidemia, Diabetes, past history of smoking. Other Factors: Afib, hx of left, hx of left subclavian stenosis and left                vertebral occlusion. Comparison Study: CTA of head and neck on 07/18/2023 showed a severe proximal left                   subclavian stenosis. No previous UEA duplex exams. Performing Technologist: Ezzie Potters RVT, RDMS  Examination Guidelines: A complete evaluation includes B-mode imaging, spectral Doppler, color Doppler, and power Doppler as needed of all accessible portions of each vessel. Bilateral testing is considered an integral part of a complete examination. Limited examinations for reoccurring indications may be performed as noted.  Right Doppler Findings: +---------------+----------+---------+--------+--------+ Site           PSV (cm/s)Waveform StenosisComments +---------------+----------+---------+--------+--------+ Subclavian Prox77        triphasic                 +---------------+----------+---------+--------+--------+ Subclavian Mid 81        triphasic                 +---------------+----------+---------+--------+--------+ Subclavian Dist          triphasic                 +---------------+----------+---------+--------+--------+ Axillary       91        triphasic                 +---------------+----------+---------+--------+--------+ Brachial Prox  55        triphasic                 +---------------+----------+---------+--------+--------+ Brachial Dist  63        biphasic                  +---------------+----------+---------+--------+--------+ Radial Prox    48  triphasic                 +---------------+----------+---------+--------+--------+ Radial Mid     56        triphasic                 +---------------+----------+---------+--------+--------+ Radial Dist    43        triphasic                  +---------------+----------+---------+--------+--------+ Ulnar Prox     45        triphasic                 +---------------+----------+---------+--------+--------+ Ulnar Mid      44        triphasic                 +---------------+----------+---------+--------+--------+ Ulnar Dist     48        triphasic                 +---------------+----------+---------+--------+--------+ Palmar Arch    25        biphasic                  +---------------+----------+---------+--------+--------+   Left Doppler Findings: +---------------+----------+----------+--------+----------------------+ Site           PSV (cm/s)Waveform  StenosisComments               +---------------+----------+----------+--------+----------------------+ Subclavian Prox43        monophasic        post stenotic waveform +---------------+----------+----------+--------+----------------------+ Subclavian Mid 29        monophasic                               +---------------+----------+----------+--------+----------------------+ Axillary       30        monophasic                               +---------------+----------+----------+--------+----------------------+ Brachial Prox  32        monophasic                               +---------------+----------+----------+--------+----------------------+ Brachial Dist  26        monophasic                               +---------------+----------+----------+--------+----------------------+ Radial Prox    18        monophasic                               +---------------+----------+----------+--------+----------------------+ Radial Mid     18        monophasic                               +---------------+----------+----------+--------+----------------------+ Radial Dist    12        monophasic                               +---------------+----------+----------+--------+----------------------+ Ulnar Prox     14         monophasic                               +---------------+----------+----------+--------+----------------------+  Ulnar Mid      14        monophasic                               +---------------+----------+----------+--------+----------------------+ Ulnar Dist     13        monophasic                               +---------------+----------+----------+--------+----------------------+ Palmar Arch    10        monophasic                               +---------------+----------+----------+--------+----------------------+   Summary:  Right: No obstruction visualized in the right upper extremity. Left: Monophasic waveforms throughout left upper extremity arterial       system, indicating a more proximal level of       obstruction/stenosis. Patient with known left subclavian       stenosis. *See table(s) above for measurements and observations. Electronically signed by Fonda Rim on 11/10/2024 at 1:12:06 PM.    Final    CT ABDOMEN PELVIS WO CONTRAST Result Date: 11/08/2024 EXAM: CT ABDOMEN AND PELVIS WITHOUT CONTRAST 11/08/2024 06:29:06 PM TECHNIQUE: CT of the abdomen and pelvis was performed without the administration of intravenous contrast. Multiplanar reformatted images are provided for review. Automated exposure control, iterative reconstruction, and/or weight-based adjustment of the mA/kV was utilized to reduce the radiation dose to as low as reasonably achievable. COMPARISON: Comparison with 07/16/2023. CLINICAL HISTORY: diarrhea diarrhea FINDINGS: LOWER CHEST: No acute abnormality. LIVER: The liver is unremarkable. GALLBLADDER AND BILE DUCTS: Cholecystectomy. No biliary ductal dilatation. SPLEEN: No acute abnormality. PANCREAS: No acute abnormality. ADRENAL GLANDS: No acute abnormality. KIDNEYS, URETERS AND BLADDER: No stones in the kidneys or ureters. No hydronephrosis. No perinephric or periureteral stranding. Urinary bladder is unremarkable. GI AND BOWEL: Stomach demonstrates no  acute abnormality. Colonic diverticulosis. Trace stranding about a few sigmoid diverticula compatible with mild diverticulitis. No abscess or free air. Liquid stool in the colon compatible with diarrheal illness. There is no bowel obstruction. PERITONEUM AND RETROPERITONEUM: No ascites. No free air. VASCULATURE: Aorta is normal in caliber. Aortic atherosclerotic calcification. LYMPH NODES: No lymphadenopathy. REPRODUCTIVE ORGANS: No acute abnormality. BONES AND SOFT TISSUES: No acute osseous abnormality. No focal soft tissue abnormality. IMPRESSION: 1. Mild sigmoid diverticulitis. 2. Liquid stool in the colon, compatible with diarrheal illness. Electronically signed by: Norman Gatlin MD 11/08/2024 06:36 PM EST RP Workstation: [id number]    Microbiology: Results for orders placed or performed during the hospital encounter of 11/08/24  C Difficile Quick Screen w PCR reflex     Status: None   Collection Time: 11/08/24 12:49 PM   Specimen: STOOL  Result Value Ref Range Status   C Diff antigen NEGATIVE NEGATIVE Final   C Diff toxin NEGATIVE NEGATIVE Final   C Diff interpretation No C. difficile detected.  Final    Comment: Performed at [facility], [address] [place], [place] [address]  Respiratory (~20 pathogens) panel by PCR     Status: None   Collection Time: 11/08/24  5:14 PM   Specimen: Nasopharyngeal Swab; Respiratory  Result Value Ref Range Status   Adenovirus NOT DETECTED NOT DETECTED Final   Coronavirus 229E NOT DETECTED NOT DETECTED Final    Comment: (NOTE) The Coronavirus on the  Respiratory Panel, DOES NOT test for the novel  Coronavirus (2019 nCoV)    Coronavirus HKU1 NOT DETECTED NOT DETECTED Final   Coronavirus NL63 NOT DETECTED NOT DETECTED Final   Coronavirus OC43 NOT DETECTED NOT DETECTED Final   Metapneumovirus NOT DETECTED NOT DETECTED Final   Rhinovirus / Enterovirus NOT DETECTED NOT DETECTED Final   Influenza A NOT DETECTED NOT DETECTED Final   Influenza B  NOT DETECTED NOT DETECTED Final   Parainfluenza Virus 1 NOT DETECTED NOT DETECTED Final   Parainfluenza Virus 2 NOT DETECTED NOT DETECTED Final   Parainfluenza Virus 3 NOT DETECTED NOT DETECTED Final   Parainfluenza Virus 4 NOT DETECTED NOT DETECTED Final   Respiratory Syncytial Virus NOT DETECTED NOT DETECTED Final   Bordetella pertussis NOT DETECTED NOT DETECTED Final   Bordetella Parapertussis NOT DETECTED NOT DETECTED Final   Chlamydophila pneumoniae NOT DETECTED NOT DETECTED Final   Mycoplasma pneumoniae NOT DETECTED NOT DETECTED Final    Comment: Performed at [facility], [address] [place], [place] [address]  Gastrointestinal Panel by PCR , Stool     Status: None   Collection Time: 11/08/24  5:14 PM   Specimen: STOOL  Result Value Ref Range Status   Campylobacter species NOT DETECTED NOT DETECTED Final   Plesimonas shigelloides NOT DETECTED NOT DETECTED Final   Salmonella species NOT DETECTED NOT DETECTED Final   Yersinia enterocolitica NOT DETECTED NOT DETECTED Final   Vibrio species NOT DETECTED NOT DETECTED Final   Vibrio cholerae NOT DETECTED NOT DETECTED Final   Enteroaggregative E coli (EAEC) NOT DETECTED NOT DETECTED Final   Enteropathogenic E coli (EPEC) NOT DETECTED NOT DETECTED Final   Enterotoxigenic E coli (ETEC) NOT DETECTED NOT DETECTED Final   Shiga like toxin producing E coli (STEC) NOT DETECTED NOT DETECTED Final   Shigella/Enteroinvasive E coli (EIEC) NOT DETECTED NOT DETECTED Final   Cryptosporidium NOT DETECTED NOT DETECTED Final   Cyclospora cayetanensis NOT DETECTED NOT DETECTED Final   Entamoeba histolytica NOT DETECTED NOT DETECTED Final   Giardia lamblia NOT DETECTED NOT DETECTED Final   Adenovirus F40/41 NOT DETECTED NOT DETECTED Final   Astrovirus NOT DETECTED NOT DETECTED Final   Norovirus GI/GII NOT DETECTED NOT DETECTED Final   Rotavirus A NOT DETECTED NOT DETECTED Final   Sapovirus (I, II, IV, and V) NOT DETECTED NOT DETECTED Final     Comment: Performed at [facility], [address] [place], [place] [address]    Labs: CBC: Recent Labs  Lab 11/08/24 1252 11/08/24 1312 11/08/24 1439 11/10/24 1207 11/11/24 0420  WBC 10.3  --  10.8* 9.2 7.9  NEUTROABS 8.9*  --   --   --   --   HGB 11.5* 11.9* 11.0* 11.8* 10.2*  HCT 36.0 35.0* 34.7* 36.1 31.6*  MCV 96.8  --  97.5 96.3 96.0  PLT 196  --  163 203 143*   Basic Metabolic Panel: Recent Labs  Lab 11/08/24 1252 11/08/24 1312 11/08/24 1439 11/09/24 0348 11/10/24 1207 11/11/24 0420  NA 135 135  --  134* 141 144  K 3.5 3.9  --  3.0* 4.6 4.3  CL 106 108  --  104 112* 116*  CO2 14*  --   --  16* 21* 19*  GLUCOSE 262* 254*  --  261* 225* 74  BUN 75* 83*  --  64* 35* 30*  CREATININE 2.34* 2.30* 2.19* 1.79* 1.37* 1.10*  CALCIUM 8.2*  --   --  7.9* 8.3*  8.1*  MG  --   --   --  1.2*  --   --    Liver Function Tests: Recent Labs  Lab 11/08/24 1252  AST 19  ALT 13  ALKPHOS 86  BILITOT 0.9  PROT 6.8  ALBUMIN 2.9*   CBG: Recent Labs  Lab 11/10/24 0754 11/10/24 1127 11/10/24 1629 11/10/24 2020 11/11/24 0745  GLUCAP 151* 186* 139* 165* 95    Discharge time spent: approximately 45 minutes spent on discharge counseling, evaluation of patient on day of discharge, and coordination of discharge planning with nursing, social work, pharmacy and case management  Signed: Lonni SHAUNNA Dalton, MD [facility] 11/11/2024

## 2024-11-11 NOTE — Telephone Encounter (Signed)
 Patient Product/process Development Scientist completed.    The patient is insured through [employer]. Patient has Medicare and is not eligible for a copay card, but may be able to apply for patient assistance or Medicare RX Payment Plan (Patient Must reach out to their plan, if eligible for payment plan), if available.    Ran test claim for Creon 36000-114000 units and the current 30 day co-pay is $247.25 due to a deductible.   This test claim was processed through [facility]- copay amounts may vary at other pharmacies due to pharmacy/plan contracts, or as the patient moves through the different stages of their insurance plan.     Reyes Sharps, CPHT Pharmacy Technician Patient Advocate Specialist Lead [facility] Patient Advocate Team Direct Number: [phone]  Fax: [phone]

## 2024-11-12 ENCOUNTER — Telehealth: Payer: Self-pay

## 2024-11-12 NOTE — Transitions of Care (Post Inpatient/ED Visit) (Signed)
   11/12/2024  Name: Ann [name] MRN: [id number] DOB: 20-Jan-1951  Today's TOC FU Call Status: Today's TOC FU Call Status:: Unsuccessful Call (2nd Attempt) Unsuccessful Call (2nd Attempt) Date: 11/12/24  Attempted to reach the patient regarding the most recent Inpatient/ED visit.  Follow Up Plan: Additional outreach attempts will be made to reach the patient to complete the Transitions of Care (Post Inpatient/ED visit) call.  Alan Ee, RN, BSN, CEN [employer]- Transition of Care Team.  [facility] [phone]

## 2024-11-12 NOTE — Transitions of Care (Post Inpatient/ED Visit) (Signed)
   11/12/2024  Name: Ann [name] MRN: [id number] DOB: 1951-11-21  Today's TOC FU Call Status: Today's TOC FU Call Status:: Unsuccessful Call (1st Attempt) Unsuccessful Call (1st Attempt) Date: 11/12/24  Attempted to reach the patient regarding the most recent Inpatient/ED visit.  Follow Up Plan: Additional outreach attempts will be made to reach the patient to complete the Transitions of Care (Post Inpatient/ED visit) call.   Alan Ee, RN, BSN, CEN [employer]- Transition of Care Team.  [facility] [phone]

## 2024-11-13 ENCOUNTER — Telehealth: Payer: Self-pay

## 2024-11-13 NOTE — Transitions of Care (Post Inpatient/ED Visit) (Signed)
   11/13/2024  Name: Ann [name] MRN: [id number] DOB: [birth year]  Today's TOC FU Call Status: Today's TOC FU Call Status:: Unsuccessful Call (3rd Attempt) Unsuccessful Call (3rd Attempt) Date: 11/13/24  Attempted to reach the patient regarding the most recent Inpatient/ED visit.  Follow Up Plan: No further outreach attempts will be made at this time. We have been unable to contact the patient.  Alan Ee, RN, BSN, CEN [employer]- Transition of Care Team.  [facility] [phone]

## 2024-11-14 ENCOUNTER — Telehealth: Payer: Self-pay

## 2024-11-14 NOTE — Telephone Encounter (Signed)
 Received fax from [facility] Client Coordination Note Report  [place] inbox front office

## 2024-11-17 ENCOUNTER — Telehealth: Payer: Self-pay | Admitting: Family Medicine

## 2024-11-17 NOTE — Telephone Encounter (Signed)
 Forms faxed

## 2024-11-17 NOTE — Telephone Encounter (Signed)
 Signed [facility] client coordination and return to CMA work basket

## 2024-11-18 ENCOUNTER — Ambulatory Visit: Payer: Self-pay

## 2024-11-18 ENCOUNTER — Telehealth: Payer: Self-pay

## 2024-11-18 NOTE — Telephone Encounter (Signed)
 [facility] faxed 1 form for review and signature from provider.  Client Coordination Report  [place] inbox front office

## 2024-11-18 NOTE — Telephone Encounter (Signed)
 FYI Only or Action Required?: FYI only for provider: appointment scheduled on 11/20/24.  Patient was last seen in primary care on 08/29/2024 by [name], DO.  Called Nurse Triage reporting Blood Sugar Problem and Fall.  Symptoms began yesterday.  Interventions attempted: Prescription medications: insulin  and quick pen.  Symptoms are: stable.  Triage Disposition: See PCP Within 2 Weeks  Patient/caregiver understands and will follow disposition?: Yes   Copied from CRM #[id number]. Topic: Clinical - Red Word Triage >> Nov 18, 2024  4:32 PM Dedra B wrote: [facility] that prompted transfer to Nurse Triage: Ann [name], OT with [facility] is currently with pt and called to report that pt's blood sugar was 260 and she fell twice yesterday. Warm transfer to NT Reason for Disposition  [1] Falling (two or more unexpected falls) AND [2] in past year  [1] Blood glucose 240 - 300 mg/dL (86.6 - 83.2 mmol/L) AND [2] uses insulin  (e.g., insulin -dependent, all people with type 1 diabetes)  Answer Assessment - Initial Assessment Questions Ann [name] from [facility] OT, contacted clinic with pt on speaker phone stating that pt's BS is 260 and she fell twice yesterday. Discussed BS with pt who stated her BS was 80 yesterday so she did not take insulin  and today she drank a strawberry milkshake then checked her BS; at that time it was 260. Pt reports she has not yet taken her insulin . Pt states she normally takes it in the morning but she slept in today. Questioned if pt was dizzy d/t BS resulting in her falls but pt denied dizziness. Stated she was in her kitchen and turned to throw something away but tripped over her walker. She states at that time she fell on her side and did not hit her head. Stated she fell a second time by her door d/t tripping and did fall into her table and hit her head. Pt denies LOC, dizziness at that time. States she does have a bruise above her R ear. She denies h/a, n/v, dizziness or any  distress at this time. Discussed importance in office visit to discuss medication adherence and evaluation d/t fall. Pt questioned if it could wait until next week, discussed importance of appt this week. Appointment scheduled for evaluation. Patient agrees with plan of care, and will call back if anything changes, or if symptoms worsen.     1. MECHANISM: How did the fall happen?     Pt states she had two falls yesterday 12/08; once she tripped over her 4-point walker in her kitchen and the second time she tripped at her front door.   3. ONSET: When did the fall happen? (e.g., minutes, hours, or days ago)     Yesterday x2  4. LOCATION: What part of the body hit the ground? (e.g., back, buttocks, head, hips, knees, hands, head, stomach)     Back/ R side; pt reports hitting her head and has a bruise above her R ear   5. INJURY: Did you hurt (injure) yourself when you fell? If Yes, ask: What did you injure? Tell me more about this? (e.g., body area; type of injury; pain severity)     Denies injury; states she was able to get herself up. Denies pain. States there is a small bruise above her R ear   6. PAIN: Is there any pain? If Yes, ask: How bad is the pain? (e.g., Scale 0-10; or none, mild,      Denies pain   7. SIZE: For cuts, bruises, or  swelling, ask: How large is it? (e.g., inches or centimeters)      Bruise above R ear   9. OTHER SYMPTOMS: Do you have any other symptoms? (e.g., dizziness, fever, weakness; new-onset or worsening).      Denies h/a, n/v or LOC   10. CAUSE: What do you think caused the fall (or falling)? (e.g., dizzy spell, tripped)       Tripped both times  Protocols used: Diabetes - High Blood Sugar-A-AH, Falls and Falling-A-AH

## 2024-11-20 ENCOUNTER — Encounter: Payer: Self-pay | Admitting: Family Medicine

## 2024-11-20 ENCOUNTER — Ambulatory Visit: Admitting: Family Medicine

## 2024-11-20 VITALS — BP 142/84 | Temp 97.9°F | Wt 221.8 lb

## 2024-11-20 DIAGNOSIS — K5792 Diverticulitis of intestine, part unspecified, without perforation or abscess without bleeding: Secondary | ICD-10-CM | POA: Diagnosis not present

## 2024-11-20 DIAGNOSIS — R4189 Other symptoms and signs involving cognitive functions and awareness: Secondary | ICD-10-CM | POA: Diagnosis not present

## 2024-11-20 DIAGNOSIS — K529 Noninfective gastroenteritis and colitis, unspecified: Secondary | ICD-10-CM

## 2024-11-20 DIAGNOSIS — Z9289 Personal history of other medical treatment: Secondary | ICD-10-CM | POA: Diagnosis not present

## 2024-11-20 DIAGNOSIS — I7300 Raynaud's syndrome without gangrene: Secondary | ICD-10-CM | POA: Insufficient documentation

## 2024-11-20 DIAGNOSIS — I10 Essential (primary) hypertension: Secondary | ICD-10-CM

## 2024-11-20 DIAGNOSIS — S0003XA Contusion of scalp, initial encounter: Secondary | ICD-10-CM | POA: Diagnosis not present

## 2024-11-20 DIAGNOSIS — Z9181 History of falling: Secondary | ICD-10-CM | POA: Insufficient documentation

## 2024-11-20 DIAGNOSIS — E1165 Type 2 diabetes mellitus with hyperglycemia: Secondary | ICD-10-CM | POA: Diagnosis not present

## 2024-11-20 DIAGNOSIS — W19XXXA Unspecified fall, initial encounter: Secondary | ICD-10-CM | POA: Diagnosis not present

## 2024-11-20 DIAGNOSIS — N179 Acute kidney failure, unspecified: Secondary | ICD-10-CM

## 2024-11-20 DIAGNOSIS — Z794 Long term (current) use of insulin: Secondary | ICD-10-CM | POA: Diagnosis not present

## 2024-11-20 LAB — COMPREHENSIVE METABOLIC PANEL WITH GFR
ALT: 11 U/L (ref 0–35)
AST: 18 U/L (ref 0–37)
Albumin: 3.5 g/dL (ref 3.5–5.2)
Alkaline Phosphatase: 100 U/L (ref 39–117)
BUN: 35 mg/dL — ABNORMAL HIGH (ref 6–23)
CO2: 23 meq/L (ref 19–32)
Calcium: 7.9 mg/dL — ABNORMAL LOW (ref 8.4–10.5)
Chloride: 105 meq/L (ref 96–112)
Creatinine, Ser: 1.52 mg/dL — ABNORMAL HIGH (ref 0.40–1.20)
GFR: 33.38 mL/min — ABNORMAL LOW (ref >60.00)
Glucose, Bld: 160 mg/dL — ABNORMAL HIGH (ref 70–99)
Potassium: 3.8 meq/L (ref 3.5–5.1)
Sodium: 138 meq/L (ref 135–145)
Total Bilirubin: 0.9 mg/dL (ref 0.2–1.2)
Total Protein: 6.5 g/dL (ref 6.0–8.3)

## 2024-11-20 LAB — CBC WITH DIFFERENTIAL/PLATELET
Basophils Absolute: 0.1 K/uL (ref 0.0–0.1)
Basophils Relative: 0.6 % (ref 0.0–3.0)
Eosinophils Absolute: 0.1 K/uL (ref 0.0–0.7)
Eosinophils Relative: 1.3 % (ref 0.0–5.0)
HCT: 35.6 % — ABNORMAL LOW (ref 36.0–46.0)
Hemoglobin: 11.7 g/dL — ABNORMAL LOW (ref 12.0–15.0)
Lymphocytes Relative: 12.5 % (ref 12.0–46.0)
Lymphs Abs: 1.1 K/uL (ref 0.7–4.0)
MCHC: 32.8 g/dL (ref 30.0–36.0)
MCV: 95.5 fl (ref 78.0–100.0)
Monocytes Absolute: 0.6 K/uL (ref 0.1–1.0)
Monocytes Relative: 6.6 % (ref 3.0–12.0)
Neutro Abs: 6.8 K/uL (ref 1.4–7.7)
Neutrophils Relative %: 79 % — ABNORMAL HIGH (ref 43.0–77.0)
Platelets: 196 K/uL (ref 150.0–400.0)
RBC: 3.73 Mil/uL — ABNORMAL LOW (ref 3.87–5.11)
RDW: 15.1 % (ref 11.5–15.5)
WBC: 8.6 K/uL (ref 4.0–10.5)

## 2024-11-20 MED ORDER — AMLODIPINE BESYLATE 2.5 MG PO TABS: 2.5000 mg | ORAL_TABLET | Freq: Every day | ORAL | 1 refills | Status: DC

## 2024-11-20 MED ORDER — METOPROLOL TARTRATE 50 MG PO TABS: 50.0000 mg | ORAL_TABLET | Freq: Two times a day (BID) | ORAL | 0 refills | Status: AC

## 2024-11-20 NOTE — Progress Notes (Signed)
 Ann [name] , [birth year], 73 y.o., female MRN: [id number] Patient Care Team    Relationship Specialty Notifications Start End  Catherine Charlies LABOR, DO PCP - General Family Medicine  07/18/23   Ladona Heinz, MD PCP - Cardiology Cardiology  02/01/24   Ladona Heinz, MD Consulting Physician Cardiology  04/20/20   Ivin Kocher, MD Referring Physician Dermatology  04/20/20   Elspeth Lauraine DEL, OD Referring Physician   04/20/20   Levorn Lenis, MD Referring Physician Orthotics  04/20/20   Sharman Doffing, MD Referring Physician Obstetrics and Gynecology  04/20/20   Lucio Elsie BROCKS, MD Referring Physician Gastroenterology  04/20/20   Shamleffer, Donell Cardinal, MD Attending Physician Endocrinology  01/31/23     Chief Complaint  Patient presents with   Hyperglycemia    Glucose today reads 161.      Subjective: Ann [name] is a 72 y.o. Pt presents for an OV with complaints of high blood sugars.  Patient was working with occupational therapy in the home, which noted her blood sugar to be 260 and reported she fell twice 11/17/2024.  Patient had reported the day prior her blood sugar was 80, therefore she did not take her insulin  the day of the high sugar and had a milkshake.   Patient is established with endocrinology which manages her diabetes condition.  Patient reports she is on a sliding scale now where she takes 10 units if she does not eat, and then on a sliding scale if she eats.  A1c 8.42 weeks ago.  Fall-patient states that she did have 2 falls on December 8, 3 days ago, however they were mechanical falls and she had tripped both times.  The first time she reports she turned around too quickly and tripped over the edge of her walker.  The other time she tripped over the corner of the door.  She does admit the second time she did hit her ear and her head on the right side.  She denies loss of consciousness.  She states she does have a bruise at this location.  She denies any headaches or  changes in her cognitive function.  She was recently hospitalized with acute kidney injury secondary to ATN, dehydration and diarrhea.  Her diarrhea is chronic and she has an appointment with gastroenterology tomorrow and [facility]).  Her colonoscopy last year was reported as normal.  She has had a CT scan in a few months ago which was also normal.  Recent CT in the ED showed a mild sigmoid diverticulitis.  Patient endorses no pain in her abdomen and Currently no blood in stools. Creatinine was 2.34 during admission.  IV fluids was provided and ARB was held.  Creatinine decreased to 1.1 on hospital discharge which is her normal baseline.  She reports she had already resumed her blood pressure medication when she returned home-this was held in the hospital.  Patient reports her fingers are blanching and turning bluish-purple and they become cold.  She reports this has been happening more frequently over the last 2 weeks.  It is a cramping currently during her appointment.  She reports sometimes it can mildly sting, but otherwise is not painful.  Cognitive function: Hospitalist have voiced concerns over her function during hospitalization.  Patient has had some mild neurocognitive function decline during her CVA.  However, March 2025 she had a scored 28/30 on her neuropsych assessment neurology after her stroke recovery.  She also been noted to be experiencing  level in thiamine , B12 and vitamin D .  Follow-up which had been replaced and memory improved. Patient states today she does not feel she needs to see the neurology team still then March.  She is post to follow-up with them yearly.        11/20/2024   12:00 PM 07/16/2024    8:50 AM 02/22/2024    1:19 PM 07/11/2023    9:10 AM 07/09/2023   11:39 AM  Depression screen PHQ 2/9  Decreased Interest 1 0 0 0 0  Down, Depressed, Hopeless 1 0 0 0 0  PHQ - 2 Score 2 0 0 0 0  Altered sleeping 0 0     Tired, decreased energy 1 0     Change in appetite 1  0     Feeling bad or failure about yourself  0 0     Trouble concentrating 0 0     Moving slowly or fidgety/restless 0 0     Suicidal thoughts 0 0     PHQ-9 Score 4 0      Difficult doing work/chores Not difficult at all Not difficult at all        Data saved with a previous flowsheet row definition      08/24/2023    8:02 AM 02/21/2024   11:09 AM 02/22/2024    1:19 PM 07/16/2024    8:45 AM 11/20/2024   11:59 AM  Fall Risk  Falls in the past year? 0 0 0 1 1  Was there an injury with Fall? 0  0   0  0  Fall Risk Category Calculator 0 0  1 2  Patient at Risk for Falls Due to     History of fall(s);Impaired balance/gait;Impaired mobility  Fall risk Follow up Falls evaluation completed Falls evaluation completed  Falls evaluation completed;Education provided;Falls prevention discussed Falls evaluation completed;Education provided;Falls prevention discussed;Follow up appointment     Data saved with a previous flowsheet row definition    Allergies[1] Social History   Social History Narrative   Marital status/children/pets: Single. G0P0.   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   Atrial fibrillation (HCC)    Dr. Debby status post cardioversion chronic anticoagulation   Basal ganglia stroke (HCC) 07/24/2023   Carotid artery occlusion    Cellulitis of right lower extremity 12/30/2020   Chicken pox    Colon cancer (HCC) 2001   Radiation, chemo.  Adenocarcinoma of the colon.  Followed by Dr. Dallie   Complication of anesthesia    sensitive to narcoticsextra sleepy. Versed - no memory recall for 4 days postop   Diabetes mellitus without  complication (HCC)    oral and Insulin    Diarrhea 07/09/2023   Diverticulosis 07/23/2023   Hepatitis    sub clinical case of Hepatitis in late 70'shas immunities   Hypertension    Hypothyroidism    Laceration of head 08/04/2019   8 cm laceration forehead/scalp after tripping when mowing her lawn.   Melanoma (HCC)    wide excision near scapula   Mixed hyperlipidemia    Nose  anomaly    septal passage narrowed right side- injury from dogbite.   Ovarian teratoma, right 09/2000   Oophorectomy   Sleep apnea    unable to tolreate cpap   Wisdom teeth extracted    Word finding difficulty 07/09/2023   Past Surgical History:  Procedure Laterality Date   ABDOMINAL HYSTERECTOMY  09/2000    Total hysterectomy and oophorectomy   ABDOMINAL WOUND DEHISCENCE Bilateral    multiple times / mesh use with multiple abdominal hernia repair   CARDIOVERSION  2014   Unsuccessful.  Patient reports cardiac ablation was not felt to be appropriate next step by cardiology.   CHOLECYSTECTOMY  04/1998   '98 -Lapraroscopic   COLON RESECTION  2001   colon cancer   FOOT SURGERY Bilateral    multiple hammer toes and bunionectomies   KNEE ARTHROSCOPY Left 08/2006   meniscectomy   MELANOMA EXCISION     near scapula   TOTAL KNEE ARTHROPLASTY Right 01/24/2016   Procedure: RIGHT TOTAL KNEE ARTHROPLASTY;  Surgeon: Dempsey Moan, MD;  Location: [facility];  Service: Orthopedics;  Laterality: Right;   Family History  Problem Relation Age of Onset   Arthritis Mother    Colon cancer Mother    Diabetes Mother    Atrial fibrillation Mother    Diabetes Father    Heart attack Father    Heart disease Father    Stroke Father    Hypertension Maternal Grandmother    Stroke Maternal Grandmother    Stroke Maternal Grandfather    Hypertension Paternal Grandmother    Stroke Paternal Grandmother    Diabetes Paternal Grandfather    Hypertension Paternal Grandfather    Stroke Paternal Grandfather    Diabetes  Brother    Brain cancer Brother    Heart disease Brother    Hypertension Brother    Stroke Brother    Allergies as of 11/20/2024       Reactions   Amoxicillin Shortness Of Breath, Swelling, Anaphylaxis   Has patient had a PCN reaction causing immediate rash, facial/tongue/throat swelling, SOB or lightheadedness with hypotension: Yes Has patient had a PCN reaction causing severe rash involving mucus membranes or skin necrosis: No Has patient had a PCN reaction that required hospitalization No Has patient had a PCN reaction occurring within the last 10 years: Yes If all of the above answers are NO, then may proceed with Cephalosporin use. Has patient had a PCN reaction causing immediate rash, facial/tongue/throat swelling, SOB or lightheadedness with hypotension: Yes Has patient had a PCN reaction causing severe rash involving mucus membranes or skin necrosis: No Has patient had a PCN reaction that required hospitalization No Has patient had a PCN reaction occurring within the last 10 years: Yes If all of the above answers are NO, then may proceed with Cephalosporin use. Has patient had a PCN reaction causing immediate rash, facial/tongue/throat swelling, SOB or lightheadedness with hypotension: Yes Has patient had a PCN reaction causing severe rash involving mucus membranes or skin necrosis: No Has patient had a PCN reaction that required hospitalization No Has patient had a PCN reaction occurring within the last 10 years: Yes If all of the above answers are NO, then may proceed with Cephalosporin use.   Ampicillin    Codeine Itching   Tolerates hydrocodone    Ezetimibe    Livalo [pitavastatin]    Statins Other (See Comments)   Muscle skeletal side effects        Medication List  Accurate as of November 20, 2024 12:01 PM. If you have any questions, ask your nurse or doctor.          STOP taking these medications    amitriptyline  25 MG tablet Commonly known  as: ELAVIL  Stopped by: Charlies Bellini, DO       TAKE these medications    allopurinol  100 MG tablet Commonly known as: ZYLOPRIM  Take 1 tablet (100 mg total) by mouth daily.   ALPRAZolam  0.25 MG tablet Commonly known as: XANAX  Take 1 tablet (0.25 mg total) by mouth daily as needed for anxiety.   amLODipine 2.5 MG tablet Commonly known as: NORVASC Take 1 tablet (2.5 mg total) by mouth daily. Started by: Charlies Bellini, DO   Basaglar  KwikPen 100 UNIT/ML Inject 46 Units into the skin every morning.   colchicine  0.6 MG tablet 2 tabs at onset of flare, followed by 1 tab 2 hours later. Then continue 1 tab daily until flare is resolved.   cyanocobalamin  1000 MCG/ML injection Commonly known as: VITAMIN B12 Inject 1 mL (1,000 mcg total) into the muscle every 14 (fourteen) days.   dabigatran  150 MG Caps capsule Commonly known as: PRADAXA  Take 1 capsule (150 mg total) by mouth 2 (two) times daily.   Dexcom G7 Sensor Misc 1 DEVICE BY DOES NOT APPLY ROUTE AS DIRECTED.   EpiPen 2-Pak 0.3 MG/0.3ML Soaj injection Generic drug: EPINEPHrine Inject 0.3 mg into the skin as needed. Bee stings   Insulin  Pen Needle 30G X 6 MM Misc 1 Device by Does not apply route in the morning, at noon, in the evening, and at bedtime.   levothyroxine  50 MCG tablet Commonly known as: SYNTHROID  Take 1 tablet (50 mcg total) by mouth daily before breakfast.   metoprolol  tartrate 50 MG tablet Commonly known as: LOPRESSOR  Take 1 tablet (50 mg total) by mouth 2 (two) times daily.   NovoLOG  FlexPen 100 UNIT/ML FlexPen Generic drug: insulin  aspart Max daily 60 units   olmesartan -hydrochlorothiazide  40-25 MG tablet Commonly known as: BENICAR  HCT Take 1 tablet by mouth daily.   OneTouch Ultra test strip Generic drug: glucose blood SMARTSIG:Via Meter   PRESCRIPTION MEDICATION Inject 50,000 mcg as directed every 14 (fourteen) days. Vitamin B12 injections   triamcinolone  ointment 0.5 % Commonly known as:  KENALOG  Apply topically 2 (two) times daily as needed.   Vitamin D  (Ergocalciferol ) 1.25 MG (50000 UNIT) Caps capsule Commonly known as: DRISDOL  TAKE 1 CAPSULE BY MOUTH ON MONDAY AND THURSDAY WITH FOOD   zolpidem  5 MG tablet Commonly known as: AMBIEN  Take 1 tablet (5 mg total) by mouth at bedtime as needed.        All past medical history, surgical history, allergies, family history, immunizations andmedications were updated in the EMR today and reviewed under the history and medication portions of their EMR.     ROS Negative, with the exception of above mentioned in HPI   Objective:  BP (!) 142/84   Temp 97.9 F (36.6 C)   Wt 221 lb 12.8 oz (100.6 kg)   BMI 33.72 kg/m  Body mass index is 33.72 kg/m.  Physical Exam Vitals and nursing note reviewed.  Constitutional:      General: She is not in acute distress.    Appearance: Normal appearance. She is obese. She is not ill-appearing, toxic-appearing or diaphoretic.  HENT:     Head: Normocephalic. Contusion present. No raccoon eyes, Battle's sign, abrasion, right periorbital erythema, left periorbital erythema or laceration.      Comments: ~  Swelling/bruising small hematoma approximately quarter size superior to right ear.  Tender to palpation. Eyes:     General: No visual field deficit or scleral icterus.       Right eye: No discharge.        Left eye: No discharge.     Extraocular Movements: Extraocular movements intact.     Conjunctiva/sclera: Conjunctivae normal.     Pupils: Pupils are equal, round, and reactive to light.  Cardiovascular:     Rate and Rhythm: Normal rate and regular rhythm.     Heart sounds: No murmur heard. Pulmonary:     Effort: Pulmonary effort is normal. No respiratory distress.     Breath sounds: Normal breath sounds. No wheezing, rhonchi or rales.  Musculoskeletal:     Right lower leg: No edema.     Left lower leg: No edema.  Skin:    General: Skin is warm.     Findings: No rash.   Neurological:     Mental Status: She is alert and oriented to person, place, and time. Mental status is at baseline.     Cranial Nerves: No cranial nerve deficit, dysarthria or facial asymmetry.     Sensory: Sensation is intact.     Motor: Weakness and atrophy present. No tremor.     Coordination: Coordination is intact.     Gait: Gait abnormal (Chronic mild weakness walking with cane).  Psychiatric:        Mood and Affect: Mood normal.        Behavior: Behavior normal.        Thought Content: Thought content normal.        Judgment: Judgment normal.      No results found. No results found. No results found for this or any previous visit (from the past 24 hours).  Assessment/Plan: Ann [name] is a 73 y.o. female present for OV for  Type 2 diabetes mellitus with hyperglycemia, with long-term current use of insulin  (HCC) Do not suspect patient was having unexpected or abnormal fluctuations in her blood sugars. She is on a sliding scale which she typically uses.  She reports the day she had the high readings she had just forgot to take the insulin  in the morning that day, and she had a milkshake which she typically does not do.  AKI (acute kidney injury)/History of recent hospitalization BMP collected today Patient had returned to her baseline by hospital discharge with creatinine 1.1.  Will repeat labs today to ensure she is maintaining the statin since restarting her blood pressure medication, with concurrent continue diarrhea. Patient reports she has been working on maintaining her hydration levels.  Diverticulitis- mild sigmoid Patient is in no pain.  White count has returned to normal by hospital discharge. Diarrhea continues, this is chronic in nature and not felt to be secondary to the mild diverticulitis noted on CT during hospitalization.  At high risk for falls-falls x 2 in the last week Patient's falls do seem to be consistent with mechanical falls.  Encouraged  follow-up with physical therapy and Occupational Therapy.  Use her walker and/or cane at all times. In the future, patient understands that she falls and has immediate swelling of the joint or if she hits her head at all, she should be seen emergently given she is over 65 and on a blood thinner.  Patient reports understanding. She is >72 hours out from her fall, therefore imaging will not alter her management with her exam being at baseline. home  health orders have been signed  Hematoma of scalp, initial encounter (Primary) Patient does have a small hematoma with tenderness on the right side of her scalp just above her ear.  Will monitor over time.  Since injury occurred greater than 72 hours prior, warm compresses can be helpful.  Raynaud's disease without gangrene New problem Patient has noticed blanching of her fingertips on both hands when cold.  This is occurring currently.  She reports she has noticed it over the last few weeks but has not noticed this occurring to her prior. Her blood pressure is mildly elevated-added amlodipine 2.5 mg to help with Raynaud's and blood pressure goal of <130/80  Cognitive impairment Mild impairment was noted surrounding prior CVA last year.  She had followed up with neurology this past March with a mental status exam of 28/30. She declines referral to neuropsychology today. She will continue to follow with her neurologist once yearly Continue vitamin D , thiamine  and B12 supplements  Chronic diarrhea Thankfully she has no new GI appointment tomorrow with [facility]. Prior workup-C. difficile negative, fecal lactoferrin positive, pancreatic elastase low at 49, GI panel normal.  CRP and ESR normal (September 2021). She has been tried on Creon , WelChol  without resolution of symptoms and the side effects.  She declined trial of Elavil .  Primary hypertension She had already restarted the Benicar  40-25.  Blood pressure is mildly above goal.  Since she is having  symptoms of Raynaud's on exam today her mom elected to add low-dose amlodipine 2.5 mg daily to also help with her blood pressure. If diarrhea continues, would need to consider removing the HCTZ portion of her blood pressure medication and increase the Benicar  dose.  Reviewed expectations re: course of current medical issues. Discussed self-management of symptoms. Outlined signs and symptoms indicating need for more acute intervention. Patient verbalized understanding and all questions were answered. Patient received an After-Visit Summary.  Return in about 11 weeks (around 02/05/2025) for Routine chronic condition follow-up.   Orders Placed This Encounter  Procedures   Comp Met (CMET)   CBC w/Diff   Meds ordered this encounter  Medications   amLODipine (NORVASC) 2.5 MG tablet    Sig: Take 1 tablet (2.5 mg total) by mouth daily.    Dispense:  90 tablet    Refill:  1   metoprolol  tartrate (LOPRESSOR ) 50 MG tablet    Sig: Take 1 tablet (50 mg total) by mouth 2 (two) times daily.    Dispense:  180 tablet    Refill:  0   Referral Orders  No referral(s) requested today     Note is dictated utilizing voice recognition software. Although note has been proof read prior to signing, occasional typographical errors still can be missed. If any questions arise, please do not hesitate to call for verification.   electronically signed by:  Charlies Bellini, DO  [facility] [place]        [1]  Allergies Allergen Reactions   Amoxicillin Shortness Of Breath, Swelling and Anaphylaxis    Has patient had a PCN reaction causing immediate rash, facial/tongue/throat swelling, SOB or lightheadedness with hypotension: Yes Has patient had a PCN reaction causing severe rash involving mucus membranes or skin necrosis: No Has patient had a PCN reaction that required hospitalization No Has patient had a PCN reaction occurring within the last 10 years: Yes If all of the above answers are NO,  then may proceed with Cephalosporin use.  Has patient had a PCN reaction causing  immediate rash, facial/tongue/throat swelling, SOB or lightheadedness with hypotension: Yes Has patient had a PCN reaction causing severe rash involving mucus membranes or skin necrosis: No Has patient had a PCN reaction that required hospitalization No Has patient had a PCN reaction occurring within the last 10 years: Yes If all of the above answers are NO, then may proceed with Cephalosporin use. Has patient had a PCN reaction causing immediate rash, facial/tongue/throat swelling, SOB or lightheadedness with hypotension: Yes Has patient had a PCN reaction causing severe rash involving mucus membranes or skin necrosis: No Has patient had a PCN reaction that required hospitalization No Has patient had a PCN reaction occurring within the last 10 years: Yes If all of the above answers are NO, then may proceed with Cephalosporin use.    Ampicillin    Codeine Itching    Tolerates hydrocodone    Ezetimibe    Livalo [Pitavastatin]    Statins Other (See Comments)    Muscle skeletal side effects

## 2024-11-20 NOTE — Patient Instructions (Addendum)
 Return in about 11 weeks (around 02/05/2025) for Routine chronic condition follow-up.        Great to see you today.  I have refilled the medication(s) we provide.   If labs were collected or images ordered, we will inform you of  results once we have received them and reviewed. We will contact you either by echart message, or telephone call.  Please give ample time to the testing facility, and our office to run,  receive and review results. Please do not call inquiring of results, even if you can see them in your chart. We will contact you as soon as we are able. If it has been over 1 week since the test was completed, and you have not yet heard from us , then please call us .    - echart message- for normal results that have been seen by the patient already.   - telephone call: abnormal results or if patient has not viewed results in their echart.  If a referral to a specialist was entered for you, please call us  in 2 weeks if you have not heard from the specialist office to schedule.

## 2024-11-21 ENCOUNTER — Ambulatory Visit: Payer: Self-pay | Admitting: Family Medicine

## 2024-11-21 DIAGNOSIS — K529 Noninfective gastroenteritis and colitis, unspecified: Secondary | ICD-10-CM | POA: Diagnosis not present

## 2024-11-21 MED ORDER — OLMESARTAN MEDOXOMIL 40 MG PO TABS: 40.0000 mg | ORAL_TABLET | Freq: Every day | ORAL | 0 refills | Status: AC

## 2024-11-21 MED ORDER — OLMESARTAN MEDOXOMIL 40 MG PO TABS: 40.0000 mg | ORAL_TABLET | Freq: Every day | ORAL | 1 refills | Status: DC

## 2024-11-21 NOTE — Telephone Encounter (Signed)
 Please call patient  Her kidney function again mildly declined and her creatinine up to 1.52, it was 1.1 in the day of hospital discharge. I would recommend she also make sure she is taking her vitamin D  supplement, and her calcium is still low.   I have called in a new blood pressure medication for her.  I recommend we discontinue the combination pill of olmesartan -HCTZ.  In its place would be the same dose of olmesartan  only.   - We are discontinuing the diuretic portion since she again has mildly decreased kidney function since restarting the medication.  We need to get her to reduce her hydration levels and stop the diuretic portion of the medication.    -I called in a short 30-day prescription of Benicar  to her local pharmacy and a 90-day prescription to her mail-in pharmacy  The amlodipine medication that we started during her visit, we are going to increase since we stopped the HCTZ.  She may have not even gotten this prescription yet since it was called into her mail-in pharmacy.   - Once she receives this prescription, instead of taking 1 a day, she will take 2 pills of amlodipine at the same time.  Please schedule her for a follow-up visit in 2-3 weeks with this provider for recheck after making the above changes.  To be clear med doses will be the following: Olmesartan  40 mg daily Amlodipine 5 mg daily Metoprolol  50 mg every 12 hours  Please make sure patient can read back those doses and understands changes

## 2024-11-24 ENCOUNTER — Telehealth: Payer: Self-pay

## 2024-11-24 DIAGNOSIS — Z794 Long term (current) use of insulin: Secondary | ICD-10-CM | POA: Diagnosis not present

## 2024-11-24 DIAGNOSIS — I059 Rheumatic mitral valve disease, unspecified: Secondary | ICD-10-CM | POA: Diagnosis not present

## 2024-11-24 DIAGNOSIS — I4821 Permanent atrial fibrillation: Secondary | ICD-10-CM | POA: Diagnosis not present

## 2024-11-24 DIAGNOSIS — Z6834 Body mass index (BMI) 34.0-34.9, adult: Secondary | ICD-10-CM | POA: Diagnosis not present

## 2024-11-24 DIAGNOSIS — N179 Acute kidney failure, unspecified: Secondary | ICD-10-CM | POA: Diagnosis not present

## 2024-11-24 DIAGNOSIS — I771 Stricture of artery: Secondary | ICD-10-CM | POA: Diagnosis not present

## 2024-11-24 DIAGNOSIS — E1169 Type 2 diabetes mellitus with other specified complication: Secondary | ICD-10-CM | POA: Diagnosis not present

## 2024-11-24 DIAGNOSIS — E1165 Type 2 diabetes mellitus with hyperglycemia: Secondary | ICD-10-CM | POA: Diagnosis not present

## 2024-11-24 DIAGNOSIS — E782 Mixed hyperlipidemia: Secondary | ICD-10-CM | POA: Diagnosis not present

## 2024-11-24 DIAGNOSIS — I119 Hypertensive heart disease without heart failure: Secondary | ICD-10-CM | POA: Diagnosis not present

## 2024-11-24 DIAGNOSIS — I700 Atherosclerosis of aorta: Secondary | ICD-10-CM | POA: Diagnosis not present

## 2024-11-24 NOTE — Telephone Encounter (Signed)
 Communication  Reason for CRM: Pt is requesting a referral for a neurologist for a thinking evaluation. Pt wants to know what neurologist Dr.Kuneff recommends and would like for the referral to be submitted at that office. Pt would like a callback with an update.

## 2024-11-24 NOTE — Telephone Encounter (Signed)
Completed and placed in CMA work basket 

## 2024-11-24 NOTE — Telephone Encounter (Signed)
 Form faxed

## 2024-11-24 NOTE — Telephone Encounter (Signed)
 [facility] faxed 1 form for review and signature from provider.   Certification & Plan of Care 11/13/24 to 01/11/25  Order # [id number]  [facility] inbox front office

## 2024-11-25 ENCOUNTER — Ambulatory Visit

## 2024-11-25 MED ORDER — INCLISIRAN SODIUM 284 MG/1.5ML ~~LOC~~ SOSY: 284.0000 mg | PREFILLED_SYRINGE | Freq: Once | SUBCUTANEOUS | Status: AC

## 2024-11-25 NOTE — Telephone Encounter (Signed)
 LVM to discuss. Sent MyChart.

## 2024-12-02 NOTE — Telephone Encounter (Signed)
 Duplicate request faxed from [facility]  Order # [id number]

## 2024-12-02 NOTE — Telephone Encounter (Signed)
 Previous form printed and refaxed.

## 2024-12-05 ENCOUNTER — Ambulatory Visit: Admitting: Internal Medicine

## 2024-12-08 ENCOUNTER — Telehealth: Payer: Self-pay

## 2024-12-08 NOTE — Telephone Encounter (Signed)
 Home health orders received 12/02/24 for Ann [name] Home health initiation orders: No.  Home health re-certification orders: Yes. Patient last seen by ordering physician for this condition: 11/20/24. Must be less than 90 days for re-certification and less than 30 days prior for initiation. Visit must have been for the condition the orders are being placed.  Patient meets criteria for Physician to sign orders: Yes.        Current med list has been attached: No        Orders placed on physicians desk for signature: 12/08/24 (date) If patient does not meet criteria for orders to be signed: pt was called to schedule appt. Appt is scheduled for n/a.   Ann [name]

## 2024-12-09 NOTE — Telephone Encounter (Signed)
Signed and returned to CMA work basket 

## 2024-12-10 ENCOUNTER — Telehealth: Payer: Self-pay

## 2024-12-10 NOTE — Telephone Encounter (Signed)
Completed and returned to CMA work basket ?

## 2024-12-10 NOTE — Telephone Encounter (Signed)
 [facility] faxed 1 form for review and signature from provider.    Order # [id number]   [facility] inbox front office

## 2024-12-10 NOTE — Telephone Encounter (Signed)
 Form faxed

## 2024-12-12 NOTE — Telephone Encounter (Signed)
 Form faxed

## 2024-12-15 ENCOUNTER — Other Ambulatory Visit: Payer: Self-pay | Admitting: Family Medicine

## 2024-12-15 DIAGNOSIS — I4821 Permanent atrial fibrillation: Secondary | ICD-10-CM

## 2024-12-15 NOTE — Telephone Encounter (Signed)
 Copied from CRM #[id number]. Topic: Clinical - Medication Refill >> Dec 15, 2024 10:30 AM Rea ORN wrote: Medication: dabigatran  (PRADAXA ) 150 MG CAPS capsule  Has the patient contacted their pharmacy? Yes (Agent: If no, request that the patient contact the pharmacy for the refill. If patient does not wish to contact the pharmacy document the reason why and proceed with request.) (Agent: If yes, when and what did the pharmacy advise?)  This is the patient's preferred pharmacy:  [facility] - [place], [place] - [facility] AT Portal to Registered [facility] [facility] [address] [place] [address] Phone: [phone] Fax: [phone]  Is this the correct pharmacy for this prescription? Yes If no, delete pharmacy and type the correct one.   Has the prescription been filled recently? No  Is the patient out of the medication? No  Has the patient been seen for an appointment in the last year OR does the patient have an upcoming appointment? Yes  Can we respond through MyChart? No  Agent: Please be advised that Rx refills may take up to 3 business days. We ask that you follow-up with your pharmacy.

## 2024-12-16 ENCOUNTER — Telehealth: Payer: Self-pay | Admitting: Cardiology

## 2024-12-16 DIAGNOSIS — I4821 Permanent atrial fibrillation: Secondary | ICD-10-CM

## 2024-12-16 MED ORDER — DABIGATRAN ETEXILATE MESYLATE 150 MG PO CAPS: 150.0000 mg | ORAL_CAPSULE | Freq: Two times a day (BID) | ORAL | 3 refills | Status: AC

## 2024-12-16 NOTE — Addendum Note (Signed)
 Addended by: LADONA MILAN on: 12/16/2024 12:26 PM   Modules accepted: Orders

## 2024-12-16 NOTE — Telephone Encounter (Signed)
"    ICD-10-CM   1. Permanent atrial fibrillation (HCC)  I48.21 dabigatran  (PRADAXA ) 150 MG CAPS capsule     Meds ordered this encounter  Medications   dabigatran  (PRADAXA ) 150 MG CAPS capsule    Sig: Take 1 capsule (150 mg total) by mouth 2 (two) times daily.    Dispense:  200 capsule    Refill:  3    "

## 2024-12-16 NOTE — Telephone Encounter (Signed)
" °*  STAT* If patient is at the pharmacy, call can be transferred to refill team.   1. Which medications need to be refilled? (please list name of each medication and dose if known) dabigatran  (PRADAXA ) 150 MG CAPS capsule   2. Which pharmacy/location (including street and city if local pharmacy) is medication to be sent to? [facility] - [place], [place] - One [facility] AT Portal to Registered [facility] Sites   3. Do they need a 30 day or 90 day supply? 90  "

## 2024-12-17 ENCOUNTER — Other Ambulatory Visit: Payer: Self-pay | Admitting: Gastroenterology

## 2024-12-17 DIAGNOSIS — K529 Noninfective gastroenteritis and colitis, unspecified: Secondary | ICD-10-CM

## 2024-12-17 DIAGNOSIS — K8681 Exocrine pancreatic insufficiency: Secondary | ICD-10-CM

## 2024-12-17 NOTE — Telephone Encounter (Signed)
 Pradaxa  150mg  refill request received. Pt is 74 years old, weight-100.6kg, Crea-1.52 on 11/20/24, last seen by Dr. Ladona on 02/01/24 and pending appt on 02/21/24, Diagnosis-Afib, CrCl-50.79 mL/min; Dose is appropriate  based on dosing criteria.   This request was refilled on 12/16/24 by another user.

## 2024-12-19 ENCOUNTER — Encounter: Payer: Self-pay | Admitting: Radiology

## 2024-12-19 ENCOUNTER — Telehealth: Payer: Self-pay

## 2024-12-19 ENCOUNTER — Ambulatory Visit

## 2024-12-19 VITALS — BP 122/78 | HR 71 | Temp 97.5°F | Resp 18 | Ht 68.0 in | Wt 218.2 lb

## 2024-12-19 DIAGNOSIS — I6350 Cerebral infarction due to unspecified occlusion or stenosis of unspecified cerebral artery: Secondary | ICD-10-CM | POA: Diagnosis not present

## 2024-12-19 DIAGNOSIS — I700 Atherosclerosis of aorta: Secondary | ICD-10-CM

## 2024-12-19 MED ORDER — INCLISIRAN SODIUM 284 MG/1.5ML ~~LOC~~ SOSY
284.0000 mg | PREFILLED_SYRINGE | Freq: Once | SUBCUTANEOUS | Status: AC
  Administered 2024-12-19: 284 mg via SUBCUTANEOUS
  Filled 2024-12-19: qty 1.5

## 2024-12-19 NOTE — Telephone Encounter (Signed)
 Auth Submission: NO AUTH NEEDED Site of care: Site of care: [facility] Payer: Medicare A/B with [employer] supplement Medication & CPT/J Code(s) submitted: Leqvio  (Inclisiran) J1306 Diagnosis Code:  Route of submission (phone, fax, portal):  Phone # Fax # Auth type: Buy/Bill PB Units/visits requested: 284mg  x 2 doses Reference number:  Approval from: 12/19/24 to 01/10/26

## 2024-12-19 NOTE — Patient Instructions (Signed)
 Inclisiran Injection What is this medication? INCLISIRAN (in kli SIR an) treats high cholesterol. It works by decreasing bad cholesterol (such as LDL) in your blood. Changes to diet and exercise are often combined with this medication. This medicine may be used for other purposes; ask your health care provider or pharmacist if you have questions. COMMON BRAND NAME(S): LEQVIO What should I tell my care team before I take this medication? They need to know if you have any of these conditions: An unusual or allergic reaction to inclisiran, other medications, foods, dyes, or preservatives Pregnant or trying to get pregnant Breast-feeding How should I use this medication? This medication is injected under the skin. It is given by your care team in a hospital or clinic setting. Talk to your care team about the use of this medication in children. Special care may be needed. Overdosage: If you think you have taken too much of this medicine contact a poison control center or emergency room at once. NOTE: This medicine is only for you. Do not share this medicine with others. What if I miss a dose? Keep appointments for follow-up doses. It is important not to miss your dose. Call your care team if you are unable to keep an appointment. What may interact with this medication? Interactions are not expected. This list may not describe all possible interactions. Give your health care provider a list of all the medicines, herbs, non-prescription drugs, or dietary supplements you use. Also tell them if you smoke, drink alcohol, or use illegal drugs. Some items may interact with your medicine. What should I watch for while using this medication? Visit your care team for regular checks on your progress. Tell your care team if your symptoms do not start to get better or if they get worse. You may need blood work while you are taking this medication. What side effects may I notice from receiving this  medication? Side effects that you should report to your care team as soon as possible: Allergic reactions--skin rash, itching, hives, swelling of the face, lips, tongue, or throat Side effects that usually do not require medical attention (report these to your care team if they continue or are bothersome): Joint pain Pain, redness, or irritation at injection site This list may not describe all possible side effects. Call your doctor for medical advice about side effects. You may report side effects to FDA at 1-800-FDA-1088. Where should I keep my medication? This medication is given in a hospital or clinic. It will not be stored at home. NOTE: This sheet is a summary. It may not cover all possible information. If you have questions about this medicine, talk to your doctor, pharmacist, or health care provider.  2024 Elsevier/Gold Standard (2023-11-09 00:00:00)

## 2024-12-19 NOTE — Progress Notes (Signed)
 Diagnosis: Hyperlipidemia  Provider:  Chilton Greathouse MD  Procedure: Injection  Leqvio (inclisiran), Dose: 284 mg, Site: subcutaneous, Number of injections: 1  Injection Site(s): Right arm  Post Care:  right arm injection  Discharge: Condition: Good, Destination: Home . AVS Provided  Performed by:  Rico Ala, LPN

## 2024-12-23 ENCOUNTER — Inpatient Hospital Stay: Admission: RE | Admit: 2024-12-23 | Discharge: 2024-12-23 | Attending: Gastroenterology

## 2024-12-23 DIAGNOSIS — K529 Noninfective gastroenteritis and colitis, unspecified: Secondary | ICD-10-CM

## 2024-12-23 DIAGNOSIS — K8681 Exocrine pancreatic insufficiency: Secondary | ICD-10-CM

## 2024-12-23 MED ORDER — IOPAMIDOL (ISOVUE-300) INJECTION 61%
75.0000 mL | Freq: Once | INTRAVENOUS | Status: AC | PRN
  Administered 2024-12-23: 75 mL via INTRAVENOUS

## 2024-12-29 ENCOUNTER — Telehealth: Payer: Self-pay

## 2024-12-29 NOTE — Telephone Encounter (Signed)
 [facility] faxed 1 form for review and signature from provider.    Order # [id number]   [facility] inbox front office

## 2025-01-01 NOTE — Telephone Encounter (Signed)
 Form faxed

## 2025-01-01 NOTE — Telephone Encounter (Signed)
Completed and placed in CMA work basket 

## 2025-01-08 ENCOUNTER — Inpatient Hospital Stay (HOSPITAL_COMMUNITY)
Admission: EM | Admit: 2025-01-08 | Discharge: 2025-01-13 | DRG: 871 | Disposition: A | Discharge status: Skilled Nursing Facility | Attending: Internal Medicine | Admitting: Internal Medicine

## 2025-01-08 ENCOUNTER — Encounter (HOSPITAL_COMMUNITY): Payer: Self-pay

## 2025-01-08 ENCOUNTER — Inpatient Hospital Stay (HOSPITAL_COMMUNITY)

## 2025-01-08 ENCOUNTER — Other Ambulatory Visit: Payer: Self-pay

## 2025-01-08 ENCOUNTER — Emergency Department (HOSPITAL_COMMUNITY)

## 2025-01-08 DIAGNOSIS — Z794 Long term (current) use of insulin: Secondary | ICD-10-CM

## 2025-01-08 DIAGNOSIS — E1122 Type 2 diabetes mellitus with diabetic chronic kidney disease: Secondary | ICD-10-CM | POA: Diagnosis present

## 2025-01-08 DIAGNOSIS — R6521 Severe sepsis with septic shock: Secondary | ICD-10-CM | POA: Diagnosis not present

## 2025-01-08 DIAGNOSIS — R571 Hypovolemic shock: Principal | ICD-10-CM | POA: Diagnosis present

## 2025-01-08 DIAGNOSIS — R432 Parageusia: Secondary | ICD-10-CM | POA: Diagnosis present

## 2025-01-08 DIAGNOSIS — E876 Hypokalemia: Secondary | ICD-10-CM | POA: Diagnosis present

## 2025-01-08 DIAGNOSIS — Z96651 Presence of right artificial knee joint: Secondary | ICD-10-CM | POA: Diagnosis present

## 2025-01-08 DIAGNOSIS — Z800 Family history of malignant neoplasm of digestive organs: Secondary | ICD-10-CM

## 2025-01-08 DIAGNOSIS — E861 Hypovolemia: Secondary | ICD-10-CM

## 2025-01-08 DIAGNOSIS — E8721 Acute metabolic acidosis: Secondary | ICD-10-CM | POA: Diagnosis present

## 2025-01-08 DIAGNOSIS — R579 Shock, unspecified: Principal | ICD-10-CM | POA: Diagnosis present

## 2025-01-08 DIAGNOSIS — Z66 Do not resuscitate: Secondary | ICD-10-CM | POA: Diagnosis present

## 2025-01-08 DIAGNOSIS — Z79890 Hormone replacement therapy (postmenopausal): Secondary | ICD-10-CM

## 2025-01-08 DIAGNOSIS — N179 Acute kidney failure, unspecified: Secondary | ICD-10-CM | POA: Diagnosis present

## 2025-01-08 DIAGNOSIS — A419 Sepsis, unspecified organism: Secondary | ICD-10-CM | POA: Diagnosis not present

## 2025-01-08 DIAGNOSIS — I4891 Unspecified atrial fibrillation: Secondary | ICD-10-CM | POA: Diagnosis not present

## 2025-01-08 DIAGNOSIS — T50905A Adverse effect of unspecified drugs, medicaments and biological substances, initial encounter: Secondary | ICD-10-CM | POA: Diagnosis present

## 2025-01-08 DIAGNOSIS — Z7901 Long term (current) use of anticoagulants: Secondary | ICD-10-CM

## 2025-01-08 DIAGNOSIS — Z79899 Other long term (current) drug therapy: Secondary | ICD-10-CM

## 2025-01-08 DIAGNOSIS — Z9181 History of falling: Secondary | ICD-10-CM

## 2025-01-08 DIAGNOSIS — G4733 Obstructive sleep apnea (adult) (pediatric): Secondary | ICD-10-CM | POA: Diagnosis present

## 2025-01-08 DIAGNOSIS — N189 Chronic kidney disease, unspecified: Secondary | ICD-10-CM | POA: Diagnosis present

## 2025-01-08 DIAGNOSIS — Z87892 Personal history of anaphylaxis: Secondary | ICD-10-CM

## 2025-01-08 DIAGNOSIS — I129 Hypertensive chronic kidney disease with stage 1 through stage 4 chronic kidney disease, or unspecified chronic kidney disease: Secondary | ICD-10-CM | POA: Diagnosis present

## 2025-01-08 DIAGNOSIS — G9341 Metabolic encephalopathy: Secondary | ICD-10-CM | POA: Diagnosis not present

## 2025-01-08 DIAGNOSIS — Z880 Allergy status to penicillin: Secondary | ICD-10-CM

## 2025-01-08 DIAGNOSIS — R791 Abnormal coagulation profile: Secondary | ICD-10-CM | POA: Diagnosis present

## 2025-01-08 DIAGNOSIS — Z888 Allergy status to other drugs, medicaments and biological substances status: Secondary | ICD-10-CM

## 2025-01-08 DIAGNOSIS — Z8261 Family history of arthritis: Secondary | ICD-10-CM

## 2025-01-08 DIAGNOSIS — K529 Noninfective gastroenteritis and colitis, unspecified: Secondary | ICD-10-CM | POA: Diagnosis present

## 2025-01-08 DIAGNOSIS — Z90710 Acquired absence of both cervix and uterus: Secondary | ICD-10-CM

## 2025-01-08 DIAGNOSIS — Z87891 Personal history of nicotine dependence: Secondary | ICD-10-CM

## 2025-01-08 DIAGNOSIS — Z8673 Personal history of transient ischemic attack (TIA), and cerebral infarction without residual deficits: Secondary | ICD-10-CM

## 2025-01-08 DIAGNOSIS — Z833 Family history of diabetes mellitus: Secondary | ICD-10-CM

## 2025-01-08 DIAGNOSIS — E1165 Type 2 diabetes mellitus with hyperglycemia: Secondary | ICD-10-CM | POA: Diagnosis present

## 2025-01-08 DIAGNOSIS — E1169 Type 2 diabetes mellitus with other specified complication: Secondary | ICD-10-CM

## 2025-01-08 DIAGNOSIS — F5104 Psychophysiologic insomnia: Secondary | ICD-10-CM | POA: Diagnosis present

## 2025-01-08 DIAGNOSIS — Z9049 Acquired absence of other specified parts of digestive tract: Secondary | ICD-10-CM

## 2025-01-08 DIAGNOSIS — R531 Weakness: Secondary | ICD-10-CM

## 2025-01-08 DIAGNOSIS — E860 Dehydration: Principal | ICD-10-CM | POA: Diagnosis present

## 2025-01-08 DIAGNOSIS — I4819 Other persistent atrial fibrillation: Secondary | ICD-10-CM | POA: Diagnosis present

## 2025-01-08 DIAGNOSIS — Z85820 Personal history of malignant melanoma of skin: Secondary | ICD-10-CM

## 2025-01-08 DIAGNOSIS — E782 Mixed hyperlipidemia: Secondary | ICD-10-CM | POA: Diagnosis present

## 2025-01-08 DIAGNOSIS — Z85038 Personal history of other malignant neoplasm of large intestine: Secondary | ICD-10-CM

## 2025-01-08 DIAGNOSIS — Z808 Family history of malignant neoplasm of other organs or systems: Secondary | ICD-10-CM

## 2025-01-08 DIAGNOSIS — F0631 Mood disorder due to known physiological condition with depressive features: Secondary | ICD-10-CM | POA: Diagnosis present

## 2025-01-08 DIAGNOSIS — Z8249 Family history of ischemic heart disease and other diseases of the circulatory system: Secondary | ICD-10-CM

## 2025-01-08 DIAGNOSIS — Z885 Allergy status to narcotic agent status: Secondary | ICD-10-CM

## 2025-01-08 DIAGNOSIS — E039 Hypothyroidism, unspecified: Secondary | ICD-10-CM | POA: Diagnosis present

## 2025-01-08 LAB — LACTIC ACID, PLASMA: Lactic Acid, Venous: 1.7 mmol/L (ref 0.5–1.9)

## 2025-01-08 LAB — CBC WITH DIFFERENTIAL/PLATELET
Abs Immature Granulocytes: 0.1 10*3/uL — ABNORMAL HIGH (ref 0.00–0.07)
Basophils Absolute: 0 10*3/uL (ref 0.0–0.1)
Basophils Relative: 0 %
Eosinophils Absolute: 0 10*3/uL (ref 0.0–0.5)
Eosinophils Relative: 0 %
HCT: 36.7 % (ref 36.0–46.0)
Hemoglobin: 12.4 g/dL (ref 12.0–15.0)
Immature Granulocytes: 1 %
Lymphocytes Relative: 7 %
Lymphs Abs: 1.1 10*3/uL (ref 0.7–4.0)
MCH: 31.1 pg (ref 26.0–34.0)
MCHC: 33.8 g/dL (ref 30.0–36.0)
MCV: 92 fL (ref 80.0–100.0)
Monocytes Absolute: 0.7 10*3/uL (ref 0.1–1.0)
Monocytes Relative: 4 %
Neutro Abs: 13.3 10*3/uL — ABNORMAL HIGH (ref 1.7–7.7)
Neutrophils Relative %: 88 %
Platelets: 185 10*3/uL (ref 150–400)
RBC: 3.99 MIL/uL (ref 3.87–5.11)
RDW: 14.6 % (ref 11.5–15.5)
WBC: 15.3 10*3/uL — ABNORMAL HIGH (ref 4.0–10.5)
nRBC: 0.1 % (ref 0.0–0.2)

## 2025-01-08 LAB — BASIC METABOLIC PANEL WITH GFR
Anion gap: 20 — ABNORMAL HIGH (ref 5–15)
BUN: 95 mg/dL — ABNORMAL HIGH (ref 8–23)
CO2: 9 mmol/L — ABNORMAL LOW (ref 22–32)
Calcium: 8.2 mg/dL — ABNORMAL LOW (ref 8.9–10.3)
Chloride: 105 mmol/L (ref 98–111)
Creatinine, Ser: 4.81 mg/dL — ABNORMAL HIGH (ref 0.44–1.00)
GFR, Estimated: 9 mL/min — ABNORMAL LOW
Glucose, Bld: 171 mg/dL — ABNORMAL HIGH (ref 70–99)
Potassium: 3.2 mmol/L — ABNORMAL LOW (ref 3.5–5.1)
Sodium: 134 mmol/L — ABNORMAL LOW (ref 135–145)

## 2025-01-08 LAB — RESP PANEL BY RT-PCR (RSV, FLU A&B, COVID)  RVPGX2
Influenza A by PCR: NEGATIVE
Influenza B by PCR: NEGATIVE
Resp Syncytial Virus by PCR: NEGATIVE
SARS Coronavirus 2 by RT PCR: NEGATIVE

## 2025-01-08 LAB — COMPREHENSIVE METABOLIC PANEL WITH GFR
ALT: 8 U/L (ref 0–44)
AST: 18 U/L (ref 15–41)
Albumin: 3.5 g/dL (ref 3.5–5.0)
Alkaline Phosphatase: 107 U/L (ref 38–126)
Anion gap: 23 — ABNORMAL HIGH (ref 5–15)
BUN: 108 mg/dL — ABNORMAL HIGH (ref 8–23)
CO2: 9 mmol/L — ABNORMAL LOW (ref 22–32)
Calcium: 8.3 mg/dL — ABNORMAL LOW (ref 8.9–10.3)
Chloride: 104 mmol/L (ref 98–111)
Creatinine, Ser: 5.82 mg/dL — ABNORMAL HIGH (ref 0.44–1.00)
GFR, Estimated: 7 mL/min — ABNORMAL LOW
Glucose, Bld: 230 mg/dL — ABNORMAL HIGH (ref 70–99)
Potassium: 3.4 mmol/L — ABNORMAL LOW (ref 3.5–5.1)
Sodium: 136 mmol/L (ref 135–145)
Total Bilirubin: 0.5 mg/dL (ref 0.0–1.2)
Total Protein: 6.6 g/dL (ref 6.5–8.1)

## 2025-01-08 LAB — I-STAT CG4 LACTIC ACID, ED
Lactic Acid, Venous: 2.8 mmol/L (ref 0.5–1.9)
Lactic Acid, Venous: 2.2 mmol/L (ref 0.5–1.9)

## 2025-01-08 LAB — PROTIME-INR
INR: 2.8 — ABNORMAL HIGH (ref 0.8–1.2)
Prothrombin Time: 30.8 s — ABNORMAL HIGH (ref 11.4–15.2)

## 2025-01-08 LAB — CBG MONITORING, ED: Glucose-Capillary: 175 mg/dL — ABNORMAL HIGH (ref 70–99)

## 2025-01-08 LAB — GLUCOSE, CAPILLARY
Glucose-Capillary: 127 mg/dL — ABNORMAL HIGH (ref 70–99)
Glucose-Capillary: 164 mg/dL — ABNORMAL HIGH (ref 70–99)

## 2025-01-08 LAB — CK: Total CK: 28 U/L — ABNORMAL LOW (ref 38–234)

## 2025-01-08 LAB — BETA-HYDROXYBUTYRIC ACID: Beta-Hydroxybutyric Acid: 1.13 mmol/L — ABNORMAL HIGH (ref 0.05–0.27)

## 2025-01-08 LAB — MRSA NEXT GEN BY PCR, NASAL: MRSA by PCR Next Gen: NOT DETECTED

## 2025-01-08 MED ORDER — NOREPINEPHRINE 4 MG/250ML-% IV SOLN
INTRAVENOUS | Status: AC
  Administered 2025-01-08: 10 ug/min via INTRAVENOUS
  Filled 2025-01-08: qty 250

## 2025-01-08 MED ORDER — LACTATED RINGERS IV BOLUS (SEPSIS)
1000.0000 mL | Freq: Once | INTRAVENOUS | Status: AC
  Administered 2025-01-08: 1000 mL via INTRAVENOUS

## 2025-01-08 MED ORDER — SODIUM CHLORIDE 0.9 % IV SOLN
2.0000 g | Freq: Once | INTRAVENOUS | Status: AC
  Administered 2025-01-08: 2 g via INTRAVENOUS
  Filled 2025-01-08: qty 12.5

## 2025-01-08 MED ORDER — CHLORHEXIDINE GLUCONATE CLOTH 2 % EX PADS
6.0000 | MEDICATED_PAD | Freq: Every day | CUTANEOUS | Status: DC
  Administered 2025-01-08 – 2025-01-13 (×6): 6 via TOPICAL

## 2025-01-08 MED ORDER — NOREPINEPHRINE 4 MG/250ML-% IV SOLN
0.0000 ug/min | INTRAVENOUS | Status: DC
  Administered 2025-01-08: 8 ug/min via INTRAVENOUS
  Filled 2025-01-08 (×2): qty 250

## 2025-01-08 MED ORDER — POLYETHYLENE GLYCOL 3350 17 G PO PACK: 17.0000 g | PACK | Freq: Every day | ORAL | Status: DC | PRN

## 2025-01-08 MED ORDER — LACTATED RINGERS IV SOLN: INTRAVENOUS | Status: AC

## 2025-01-08 MED ORDER — HEPARIN (PORCINE) 25000 UT/250ML-% IV SOLN
1200.0000 [IU]/h | INTRAVENOUS | Status: DC
  Administered 2025-01-08: 1250 [IU]/h via INTRAVENOUS
  Filled 2025-01-08: qty 250

## 2025-01-08 MED ORDER — ORAL CARE MOUTH RINSE: 15.0000 mL | OROMUCOSAL | Status: AC | PRN

## 2025-01-08 MED ORDER — NOREPINEPHRINE 4 MG/250ML-% IV SOLN: 0.0000 ug/min | INTRAVENOUS | Status: DC

## 2025-01-08 MED ORDER — METRONIDAZOLE 500 MG/100ML IV SOLN
500.0000 mg | Freq: Once | INTRAVENOUS | Status: AC
  Administered 2025-01-08: 500 mg via INTRAVENOUS
  Filled 2025-01-08: qty 100

## 2025-01-08 MED ORDER — LACTATED RINGERS IV BOLUS
1000.0000 mL | Freq: Once | INTRAVENOUS | Status: AC
  Administered 2025-01-08: 1000 mL via INTRAVENOUS

## 2025-01-08 MED ORDER — INSULIN ASPART 100 UNIT/ML IJ SOLN
0.0000 [IU] | INTRAMUSCULAR | Status: DC
  Administered 2025-01-08: 2 [IU] via SUBCUTANEOUS
  Administered 2025-01-08: 3 [IU] via SUBCUTANEOUS
  Administered 2025-01-09: 2 [IU] via SUBCUTANEOUS
  Administered 2025-01-09: 8 [IU] via SUBCUTANEOUS
  Administered 2025-01-09: 2 [IU] via SUBCUTANEOUS
  Administered 2025-01-09: 5 [IU] via SUBCUTANEOUS
  Administered 2025-01-09 – 2025-01-10 (×2): 2 [IU] via SUBCUTANEOUS
  Administered 2025-01-10 (×3): 3 [IU] via SUBCUTANEOUS
  Administered 2025-01-10 (×2): 5 [IU] via SUBCUTANEOUS
  Administered 2025-01-11 (×2): 2 [IU] via SUBCUTANEOUS
  Filled 2025-01-08: qty 5
  Filled 2025-01-08: qty 2
  Filled 2025-01-08: qty 3
  Filled 2025-01-08: qty 2
  Filled 2025-01-08: qty 3
  Filled 2025-01-08: qty 8
  Filled 2025-01-08: qty 5
  Filled 2025-01-08: qty 3
  Filled 2025-01-08 (×2): qty 2
  Filled 2025-01-08: qty 1
  Filled 2025-01-08: qty 3

## 2025-01-08 MED ORDER — COSYNTROPIN 0.25 MG IJ SOLR
0.2500 mg | Freq: Once | INTRAMUSCULAR | Status: AC
  Administered 2025-01-09: 0.25 mg via INTRAVENOUS
  Filled 2025-01-08: qty 0.25

## 2025-01-08 MED ORDER — SODIUM CHLORIDE 0.9 % IV SOLN: 250.0000 mL | INTRAVENOUS | Status: AC

## 2025-01-08 MED ORDER — POTASSIUM CHLORIDE 10 MEQ/100ML IV SOLN
10.0000 meq | INTRAVENOUS | Status: AC
  Administered 2025-01-08 – 2025-01-09 (×4): 10 meq via INTRAVENOUS
  Filled 2025-01-08 (×3): qty 100

## 2025-01-08 MED ORDER — SENNA 8.6 MG PO TABS: 1.0000 | ORAL_TABLET | Freq: Two times a day (BID) | ORAL | Status: DC | PRN

## 2025-01-08 MED ORDER — VANCOMYCIN HCL IN DEXTROSE 1-5 GM/200ML-% IV SOLN
1000.0000 mg | Freq: Once | INTRAVENOUS | Status: AC
  Administered 2025-01-08: 1000 mg via INTRAVENOUS
  Filled 2025-01-08: qty 200

## 2025-01-08 NOTE — H&P (Signed)
 "  NAME:  TESLA KEELER, MRN:  [id number], DOB:  [birth year], LOS: 0 ADMISSION DATE:  01/08/2025, CONSULTATION DATE:  01/08/2025 REFERRING MD:  Dr. Franky Gaul, CHIEF COMPLAINT:  Sepsis   History of Present Illness:  Ms. Helene Bernstein is a 74 year old female with a past medical history of atrial fibrillation on pradaxa , colon cancer s/p resection and radiation, chronic diarrhea (of 1 year), hypertension, hypothyroidism, DM type II on long term insulin , OSA not on CPAP, and prior basal ganglia stroke without residual symptoms who presented to [facility] with acute hypotension and weakness.  EMS called to home by family after Ms. Mihalic reported dizziness and lightheadedness.  On arrival EMS noted systolic BP in the 50s, she was given 500ccs of fluids with no response and started on Epi gtt.  Reports chronic history of diarrhea for at least one year which has remained stable.    In ED she received 4L LR, started on Levophed  12mcg/min (started on Epi gtt on EMS), Cefepime  2g, Vanc 1000mg , Flagyl  500mg  once.   On assessment, Ms. Dosch is awake, alert and interactive.  She is able to participate in history, she has felt unwell for a few days with decreased appetite.  No recent illness, fever, headache, chest pain or cough.  Family at bedside reports at least 3 weeks of intermittent confusion and speech difficulty.   Pertinent  Medical History   Past Medical History:  Diagnosis Date   Acute metabolic acidosis 11/09/2024   Arthritis    osteoarthritis- knees.   Atrial fibrillation (HCC)    Dr. Debby status post cardioversion chronic anticoagulation   Basal ganglia stroke (HCC) 07/24/2023   Carotid artery occlusion    Cellulitis of right lower extremity 12/30/2020   Chicken pox    Colon cancer (HCC) 2001   Radiation, chemo.  Adenocarcinoma of the colon.  Followed by Dr. Dallie   Complication of anesthesia    sensitive to narcoticsextra sleepy. Versed - no memory recall for 4 days  postop   Diabetes mellitus without complication (HCC)    oral and Insulin    Diarrhea 07/09/2023   Diverticulosis 07/23/2023   Hepatitis    sub clinical case of Hepatitis in late 70'shas immunities   Hypertension    Hypothyroidism    Laceration of head 08/04/2019   8 cm laceration forehead/scalp after tripping when mowing her lawn.   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   Ovarian teratoma, right 09/2000   Oophorectomy   Sleep apnea    unable to tolreate cpap   Wisdom teeth extracted    Word finding difficulty 07/09/2023     Significant Hospital Events: Including procedures, antibiotic start and stop dates in addition to other pertinent events   Admit to ICU on levophed  12mcg/min  Interim History / Subjective:    Objective    Blood pressure 118/62, pulse 76, temperature (!) 97.4 F (36.3 C), temperature source Oral, resp. rate 17, height 5' 8 (1.727 m), weight 99 kg, SpO2 100%.        Intake/Output Summary (Last 24 hours) at 01/08/2025 1900 Last data filed at 01/08/2025 1739 Gross per 24 hour  Intake 4835 ml  Output --  Net 4835 ml   Filed Weights   01/08/25 1422  Weight: 99 kg    Examination: General: Adult female lying in stretcher, no acute distress HENT:  atraumatic, mucous membranes moist Lungs: clear to auscultation  Cardiovascular: irregular rate and rhythm  Abdomen: rounded, soft Extremities: no open wounds, no edema  Neuro: Awake, alert following commands and moving all extremities x4; decreased range of motion in RUE    Resolved problem list   Assessment and Plan  # Hypotension due to shock  # Lactic Acidosis  # Leukocytosis  # Hypothermia  - continue levophed  for MAP goal >65 - s/p 4L LR in ED, continue LR @ 125cc/hr  - Respiratory panel negative  - Blood cultures and UA ordered  - will add Vanc/Zosyn for abx coverage  -  repeat lactic  - Unclear source of shock: infection vs dehydration  # Diarrhea, chronic -worsening  - GI panel and cdiff ordered   # Atrial Fibrillation, Paroxysmal  # Coagulopathy due to medication  - Holding: Pradaxa , Benicar , and metoprolol  - will add heparin  gtt while in hospital  - TTE ordered   # DM type II with Hyperglycemia with long term insulin  use  # Hypothyroidism - Continue SSI insulin , BG goal 100-180 - Continue Synthroid  50mcg  # AKI  # Anion Gap Metabolic Acidosis # Mild Hypokalemia  - baseline cr ~1; currently 5.82 - start bicarb gtt  - check bhb  - Recent admission 11/2024 for AKI due to ATN, dehydration, and diarrhea - Trend renal panel daily    # Limited Movement of RUE - history of falls, will order XR   # History of Stroke, basal ganglia - speech/aphasia difficulty ?recrudescence of symptoms in setting of sepsis  - Social/Ethics: Discussed at bedside with patient and family (sisters Jenkins and Deland) current plan of care and in the event of cardiac arrest Ms. Vanhorne is a DNR with PRN intubation if needed.  Further discussed that current levophed  dosing meets criteria for central line placement and Ms. Emry does not wish to have one place at this time and allow for down-titration of levophed .    Labs   CBC: Recent Labs  Lab 01/08/25 1425  WBC 15.3*  NEUTROABS 13.3*  HGB 12.4  HCT 36.7  MCV 92.0  PLT 185    Basic Metabolic Panel: Recent Labs  Lab 01/08/25 1425  NA 136  K 3.4*  CL 104  CO2 9*  GLUCOSE 230*  BUN 108*  CREATININE 5.82*  CALCIUM 8.3*   GFR: Estimated Creatinine Clearance: 10.3 mL/min (A) (by C-G formula based on SCr of 5.82 mg/dL (H)). Recent Labs  Lab 01/08/25 1425 01/08/25 1446 01/08/25 1740  WBC 15.3*  --   --   LATICACIDVEN  --  2.8* 2.2*    Liver Function Tests: Recent Labs  Lab 01/08/25 1425  AST 18  ALT 8  ALKPHOS 107  BILITOT 0.5  PROT 6.6  ALBUMIN 3.5   No results for input(s): LIPASE,  AMYLASE in the last 168 hours. No results for input(s): AMMONIA in the last 168 hours.  ABG    Component Value Date/Time   TCO2 15 (L) 11/08/2024 1312     Coagulation Profile: Recent Labs  Lab 01/08/25 1425  INR 2.8*    Cardiac Enzymes: Recent Labs  Lab 01/08/25 1425  CKTOTAL 28*    HbA1C: Hemoglobin A1C  Date/Time Value Ref Range Status  09/03/2024 09:01 AM 6.9 (A) 4.0 - 5.6 % Final  05/02/2024 09:57 AM 7.0 (A) 4.0 - 5.6 % Final  09/26/2019 12:00 AM 8.0  Final  06/27/2019 12:00 AM 8.2  Final   HbA1c, POC (prediabetic range)  Date/Time Value Ref Range Status  04/20/2020 10:24 AM 8.1 (A) 5.7 - 6.4 % Final   HbA1c, POC (controlled diabetic range)  Date/Time Value Ref Range Status  04/20/2020 10:24 AM 8.1 (A) 0.0 - 7.0 % Final   HbA1c POC (<> result, manual entry)  Date/Time Value Ref Range Status  04/20/2020 10:24 AM 8.1 4.0 - 5.6 % Final   Hgb A1c MFr Bld  Date/Time Value Ref Range Status  11/10/2024 12:07 PM 8.4 (H) 4.8 - 5.6 % Final    Comment:    (NOTE)         Prediabetes: 5.7 - 6.4         Diabetes: >6.4         Glycemic control for adults with diabetes: <7.0   07/18/2023 03:30 PM 9.5 (H) 4.8 - 5.6 % Final    Comment:    (NOTE) Pre diabetes:          5.7%-6.4%  Diabetes:              >6.4%  Glycemic control for   <7.0% adults with diabetes     CBG: No results for input(s): GLUCAP in the last 168 hours.  Review of Systems:   Review of Systems  Constitutional:  Positive for malaise/fatigue.  Eyes: Negative.   Respiratory: Negative.    Cardiovascular: Negative.   Gastrointestinal: Negative.   Skin: Negative.    Past Medical History:  She,  has a past medical history of Acute metabolic acidosis (11/09/2024), Arthritis, Atrial fibrillation (HCC), Basal ganglia stroke (HCC) (07/24/2023), Carotid artery occlusion, Cellulitis of right lower extremity (12/30/2020), Chicken pox, Colon cancer (HCC) (2001), Complication of anesthesia,  Diabetes mellitus without complication (HCC), Diarrhea (07/09/2023), Diverticulosis (07/23/2023), Hepatitis, Hypertension, Hypothyroidism, Laceration of head (08/04/2019), Melanoma (HCC), Mixed hyperlipidemia, Nose anomaly, Ovarian teratoma, right (09/2000), Sleep apnea, Wisdom teeth extracted, and Word finding difficulty (07/09/2023).   Surgical History:   Past Surgical History:  Procedure Laterality Date   ABDOMINAL HYSTERECTOMY  09/2000    Total hysterectomy and oophorectomy   ABDOMINAL WOUND DEHISCENCE Bilateral    multiple times / mesh use with multiple abdominal hernia repair   CARDIOVERSION  2014   Unsuccessful.  Patient reports cardiac ablation was not felt to be appropriate next step by cardiology.   CHOLECYSTECTOMY  04/1998   '98 -Lapraroscopic   COLON RESECTION  2001   colon cancer   FOOT SURGERY Bilateral    multiple hammer toes and bunionectomies   KNEE ARTHROSCOPY Left 08/2006   meniscectomy   MELANOMA EXCISION     near scapula   TOTAL KNEE ARTHROPLASTY Right 01/24/2016   Procedure: RIGHT TOTAL KNEE ARTHROPLASTY;  Surgeon: Dempsey Moan, MD;  Location: [facility];  Service: Orthopedics;  Laterality: Right;     Social History:   reports that she quit smoking about 27 years ago. Her smoking use included cigarettes. She started smoking about 53 years ago. She has never used smokeless tobacco. She reports that she does not drink alcohol and does not use drugs.   Family History:  Her family history includes Arthritis in her mother; Atrial fibrillation in her mother; Brain cancer in her brother; Colon cancer in her mother; Diabetes in her brother, father, mother, and paternal grandfather; Heart attack in her father; Heart disease in her brother and father; Hypertension in her brother, maternal grandmother, paternal grandfather, and paternal grandmother; Stroke in her brother, father, maternal grandfather, maternal grandmother, paternal grandfather, and paternal grandmother.    Allergies Allergies[1]  Home Medications  Prior to Admission medications  Medication Sig Start Date End Date Taking? Authorizing Provider  allopurinol  (ZYLOPRIM ) 100 MG tablet Take 1 tablet (100 mg total) by mouth daily. 08/05/24   Kuneff, Renee A, DO  ALPRAZolam  (XANAX ) 0.25 MG tablet Take 1 tablet (0.25 mg total) by mouth daily as needed for anxiety. 02/19/24   Kuneff, Renee A, DO  amLODipine  (NORVASC ) 2.5 MG tablet Take 1 tablet (2.5 mg total) by mouth daily. 11/20/24   Kuneff, Renee A, DO  colchicine  0.6 MG tablet 2 tabs at onset of flare, followed by 1 tab 2 hours later. Then continue 1 tab daily until flare is resolved. 08/05/24   Kuneff, Renee A, DO  Continuous Glucose Sensor (DEXCOM G7 SENSOR) MISC 1 DEVICE BY DOES NOT APPLY ROUTE AS DIRECTED. 01/21/24   Shamleffer, Donell Cardinal, MD  cyanocobalamin  (VITAMIN B12) 1000 MCG/ML injection Inject 1 mL (1,000 mcg total) into the muscle every 14 (fourteen) days. 08/05/24   Kuneff, Renee A, DO  dabigatran  (PRADAXA ) 150 MG CAPS capsule Take 1 capsule (150 mg total) by mouth 2 (two) times daily. 12/16/24   Ladona Heinz, MD  EPIPEN 2-PAK 0.3 MG/0.3ML SOAJ injection Inject 0.3 mg into the skin as needed. Bee stings 11/11/15   [provider]  insulin  aspart (NOVOLOG  FLEXPEN) 100 UNIT/ML FlexPen Max daily 60 units 09/03/24   Shamleffer, Ibtehal Jaralla, MD  Insulin  Glargine (BASAGLAR  KWIKPEN) 100 UNIT/ML Inject 46 Units into the skin every morning. 09/03/24   Shamleffer, Ibtehal Jaralla, MD  Insulin  Pen Needle 30G X 6 MM MISC 1 Device by Does not apply route in the morning, at noon, in the evening, and at bedtime. 09/03/24   Shamleffer, Ibtehal Jaralla, MD  levothyroxine  (SYNTHROID ) 50 MCG tablet Take 1 tablet (50 mcg total) by mouth daily before breakfast. 08/05/24   Kuneff, Renee A, DO  metoprolol  tartrate (LOPRESSOR ) 50 MG tablet Take 1 tablet (50 mg total) by mouth 2 (two) times daily. 11/20/24   Kuneff, Renee A, DO  olmesartan  (BENICAR ) 40 MG  tablet Take 1 tablet (40 mg total) by mouth daily. 11/21/24   Kuneff, Renee A, DO  olmesartan  (BENICAR ) 40 MG tablet Take 1 tablet (40 mg total) by mouth daily. 11/21/24   Catherine, Renee A, DO  ONETOUCH ULTRA test strip SMARTSIG:Via Meter 10/18/20   [provider]  PRESCRIPTION MEDICATION Inject 50,000 mcg as directed every 14 (fourteen) days. Vitamin B12 injections    [provider]  triamcinolone  ointment (KENALOG ) 0.5 % Apply topically 2 (two) times daily as needed. 02/07/21   [provider]  Vitamin D , Ergocalciferol , (DRISDOL ) 1.25 MG (50000 UNIT) CAPS capsule TAKE 1 CAPSULE BY MOUTH ON MONDAY AND THURSDAY WITH FOOD 05/14/24   Kuneff, Renee A, DO  zolpidem  (AMBIEN ) 5 MG tablet Take 1 tablet (5 mg total) by mouth at bedtime as needed. 08/05/24   [name], DO     Critical care time:  - This patient is critically ill due to Sepsis and Hypertensive emergency with numerous comorbidities and at significant risk of hemodynamic worsening, respiratory failure, bleeding, infection, and Hemodynamic instability. This patient's care requires constant monitoring of vital signs, hemodynamics, respiratory and cardiac monitoring, review of multiple databases, neurological assessment, discussion with family, other specialists and medical decision making of high complexity.  Total Critical Care time spent: 55 minutes.  This time includes review of data, examination of the patient, formulation of plan.  This time is separate from any billable procedures.             [  1]  Allergies Allergen Reactions   Amoxicillin Anaphylaxis, Shortness Of Breath and Swelling    Has patient had a PCN reaction causing immediate rash, facial/tongue/throat swelling, SOB or lightheadedness with hypotension: Yes Has patient had a PCN reaction causing severe rash involving mucus membranes or skin necrosis: No Has patient had a PCN reaction that required hospitalization No Has patient had a PCN  reaction occurring within the last 10 years: Yes   Ampicillin    Codeine Itching    Tolerates hydrocodone    Ezetimibe    Livalo [Pitavastatin]    Statins Other (See Comments)    Muscle skeletal side effects   "

## 2025-01-08 NOTE — Progress Notes (Signed)
 PHARMACY - ANTICOAGULATION CONSULT NOTE  Pharmacy Consult for IV heparin  Indication: atrial fibrillation  Allergies[1]  Patient Measurements: Height: 5' 8 (172.7 cm) Weight: 99 kg (218 lb 3.2 oz) IBW/kg (Calculated) : 63.9 HEPARIN  DW (KG): 85.6  Vital Signs: Temp: 97.4 F (36.3 C) (01/29 1545) Temp Source: Oral (01/29 1545) BP: 116/65 (01/29 1745) Pulse Rate: 76 (01/29 1745)  Labs: Recent Labs    01/08/25 1425  HGB 12.4  HCT 36.7  PLT 185  LABPROT 30.8*  INR 2.8*  CREATININE 5.82*    Estimated Creatinine Clearance: 10.3 mL/min (A) (by C-G formula based on SCr of 5.82 mg/dL (H)).   Medical History: Past Medical History:  Diagnosis Date   Acute metabolic acidosis 11/09/2024   Arthritis    osteoarthritis- knees.   Atrial fibrillation (HCC)    Dr. Debby status post cardioversion chronic anticoagulation   Basal ganglia stroke (HCC) 07/24/2023   Carotid artery occlusion    Cellulitis of right lower extremity 12/30/2020   Chicken pox    Colon cancer (HCC) 2001   Radiation, chemo.  Adenocarcinoma of the colon.  Followed by Dr. Dallie   Complication of anesthesia    sensitive to narcoticsextra sleepy. Versed - no memory recall for 4 days postop   Diabetes mellitus without complication (HCC)    oral and Insulin    Diarrhea 07/09/2023   Diverticulosis 07/23/2023   Hepatitis    sub clinical case of Hepatitis in late 70'shas immunities   Hypertension    Hypothyroidism    Laceration of head 08/04/2019   8 cm laceration forehead/scalp after tripping when mowing her lawn.   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   Ovarian teratoma, right 09/2000   Oophorectomy   Sleep apnea    unable to tolreate cpap   Wisdom teeth extracted    Word finding difficulty 07/09/2023    Medications:  (Not in a hospital admission)   Assessment: 74yo F presenting  with hypotension. Pt on dabigatran  PTA (LD unknown). Pharmacy consulted for heparin . Baseline CBC stable.Will monitor aPTT and HL until correlating given potential DOAC exposure.    Goal of Therapy:  Heparin  level 0.3-0.7 units/ml Monitor platelets by anticoagulation protocol: Yes   Plan:  Start heparin  infusion at 1250 units/hr at 2000 Check anti-Xa level in 8 hours and daily while on heparin  Continue to monitor H&H and platelets  Elma Fail, PharmD PGY1 Clinical Pharmacist [facility]  01/08/2025 6:32 PM       [1]  Allergies Allergen Reactions   Amoxicillin Anaphylaxis, Shortness Of Breath and Swelling    Has patient had a PCN reaction causing immediate rash, facial/tongue/throat swelling, SOB or lightheadedness with hypotension: Yes Has patient had a PCN reaction causing severe rash involving mucus membranes or skin necrosis: No Has patient had a PCN reaction that required hospitalization No Has patient had a PCN reaction occurring within the last 10 years: Yes   Ampicillin    Codeine Itching    Tolerates hydrocodone    Ezetimibe    Livalo [Pitavastatin]    Statins Other (See Comments)    Muscle skeletal side effects

## 2025-01-08 NOTE — ED Notes (Addendum)
 Resting comfortably, alert, NAD, calm, interactive, skin W&D. BP and temp improved. VSS. Family x2 at [facility]. Pt to CT with RN, on monitor. No changes.

## 2025-01-08 NOTE — ED Provider Notes (Signed)
 Signed out to admit patient.   RN indicates bp trending lower after 3 liters ivf.   Recheck pt, no new/acute complaints or pain. Is breathing comfortably. Good sats. Bp in 80s. Low dose levophed  started and additional ivf ordered.  PCCM consulted for admission. CT imaging remains pending.    Bernard Drivers, MD 01/08/25 1723

## 2025-01-08 NOTE — ED Notes (Signed)
 BP low, trending down, MAPs 50s-70s, EDP notified. No change in pt status. Remains alert, NAD, calm, interactive, skin W&D, resps e/u, speaking clearly.

## 2025-01-08 NOTE — ED Notes (Signed)
 EDP at [facility] speaking with pt and family x2

## 2025-01-08 NOTE — ED Provider Notes (Signed)
 "  EMERGENCY DEPARTMENT AT [facility] Provider Note   CSN: [id number] Arrival date & time: 01/08/25  1407     Patient presents with: No chief complaint on file.   Ann [name] is a 74 y.o. female.   HPI 74 year old female presents with hypotension and weakness.  History is from EMS and somewhat from the patient.  She nearly passed out today and was lightheaded so family called EMS.  Patient's initial blood pressures were in the 50s.  Got 500 cc of fluids with minimal response.  EMS states they put her on an epi drip and monitored her over the next hour and her blood pressure came up and they were titrating and then eventually transported to the ED.  When they were coming down off the epinephrine her blood pressure would go back down.  The patient has chronic diarrhea that is unchanged over the last several months.  She denies any blood in the stools, abdominal pain, fever, headache, chest pain, cough, etc.  She feels overall lightheaded but no other symptoms.  Prior to Admission medications  Medication Sig Start Date End Date Taking? Authorizing Provider  allopurinol  (ZYLOPRIM ) 100 MG tablet Take 1 tablet (100 mg total) by mouth daily. 08/05/24   Kuneff, Renee A, DO  ALPRAZolam  (XANAX ) 0.25 MG tablet Take 1 tablet (0.25 mg total) by mouth daily as needed for anxiety. 02/19/24   Kuneff, Renee A, DO  amLODipine  (NORVASC ) 2.5 MG tablet Take 1 tablet (2.5 mg total) by mouth daily. 11/20/24   Kuneff, Renee A, DO  colchicine  0.6 MG tablet 2 tabs at onset of flare, followed by 1 tab 2 hours later. Then continue 1 tab daily until flare is resolved. 08/05/24   Kuneff, Renee A, DO  Continuous Glucose Sensor (DEXCOM G7 SENSOR) MISC 1 DEVICE BY DOES NOT APPLY ROUTE AS DIRECTED. 01/21/24   Shamleffer, Donell Cardinal, MD  cyanocobalamin  (VITAMIN B12) 1000 MCG/ML injection Inject 1 mL (1,000 mcg total) into the muscle every 14 (fourteen) days. 08/05/24   Kuneff, Renee A, DO  dabigatran   (PRADAXA ) 150 MG CAPS capsule Take 1 capsule (150 mg total) by mouth 2 (two) times daily. 12/16/24   Ladona Heinz, MD  EPIPEN 2-PAK 0.3 MG/0.3ML SOAJ injection Inject 0.3 mg into the skin as needed. Bee stings 11/11/15   [provider]  insulin  aspart (NOVOLOG  FLEXPEN) 100 UNIT/ML FlexPen Max daily 60 units 09/03/24   Shamleffer, Ibtehal Jaralla, MD  Insulin  Glargine (BASAGLAR  KWIKPEN) 100 UNIT/ML Inject 46 Units into the skin every morning. 09/03/24   Shamleffer, Ibtehal Jaralla, MD  Insulin  Pen Needle 30G X 6 MM MISC 1 Device by Does not apply route in the morning, at noon, in the evening, and at bedtime. 09/03/24   Shamleffer, Ibtehal Jaralla, MD  levothyroxine  (SYNTHROID ) 50 MCG tablet Take 1 tablet (50 mcg total) by mouth daily before breakfast. 08/05/24   Kuneff, Renee A, DO  metoprolol  tartrate (LOPRESSOR ) 50 MG tablet Take 1 tablet (50 mg total) by mouth 2 (two) times daily. 11/20/24   Kuneff, Renee A, DO  olmesartan  (BENICAR ) 40 MG tablet Take 1 tablet (40 mg total) by mouth daily. 11/21/24   Kuneff, Renee A, DO  olmesartan  (BENICAR ) 40 MG tablet Take 1 tablet (40 mg total) by mouth daily. 11/21/24   Catherine, Renee A, DO  ONETOUCH ULTRA test strip SMARTSIG:Via Meter 10/18/20   [provider]  PRESCRIPTION MEDICATION Inject 50,000 mcg as directed every 14 (fourteen) days. Vitamin B12 injections  [provider]  triamcinolone  ointment (KENALOG ) 0.5 % Apply topically 2 (two) times daily as needed. 02/07/21   [provider]  Vitamin D , Ergocalciferol , (DRISDOL ) 1.25 MG (50000 UNIT) CAPS capsule TAKE 1 CAPSULE BY MOUTH ON MONDAY AND THURSDAY WITH FOOD 05/14/24   Kuneff, Renee A, DO  zolpidem  (AMBIEN ) 5 MG tablet Take 1 tablet (5 mg total) by mouth at bedtime as needed. 08/05/24   Kuneff, Renee A, DO    Allergies: Amoxicillin, Ampicillin, Codeine, Ezetimibe, Livalo [pitavastatin], and Statins    Review of Systems  Constitutional:  Negative for fever.  Respiratory:   Negative for shortness of breath.   Cardiovascular:  Negative for chest pain.  Gastrointestinal:  Positive for diarrhea. Negative for abdominal pain and vomiting.  Neurological:  Positive for weakness.    Updated Vital Signs Ht 5' 8 (1.727 m)   Wt 99 kg   BMI 33.18 kg/m   Physical Exam Vitals and nursing note reviewed.  Constitutional:      Appearance: She is well-developed. She is ill-appearing. She is not diaphoretic.  HENT:     Head: Normocephalic and atraumatic.  Cardiovascular:     Rate and Rhythm: Normal rate and regular rhythm.     Heart sounds: Normal heart sounds.  Pulmonary:     Effort: Pulmonary effort is normal.     Breath sounds: Normal breath sounds. No wheezing.  Abdominal:     General: There is no distension.     Palpations: Abdomen is soft.     Tenderness: There is no abdominal tenderness.  Musculoskeletal:     Cervical back: No rigidity.  Skin:    General: Skin is warm and dry.     Capillary Refill: Capillary refill takes more than 3 seconds.  Neurological:     Mental Status: She is alert.     Comments: Awake, alert, oriented to person, place, time. Equal strength in all 4 extremities.     (all labs ordered are listed, but only abnormal results are displayed) Labs Reviewed  RESP PANEL BY RT-PCR (RSV, FLU A&B, COVID)  [id number]  CULTURE, BLOOD (ROUTINE X 2)  CULTURE, BLOOD (ROUTINE X 2)  C DIFFICILE QUICK SCREEN W PCR REFLEX    GASTROINTESTINAL PANEL BY PCR, STOOL (REPLACES STOOL CULTURE)  COMPREHENSIVE METABOLIC PANEL WITH GFR  CBC WITH DIFFERENTIAL/PLATELET  PROTIME-INR  URINALYSIS, W/ REFLEX TO CULTURE (INFECTION SUSPECTED)  I-STAT CG4 LACTIC ACID, ED    EKG: EKG Interpretation Date/Time:  Thursday January 08 2025 14:57:06 EST Ventricular Rate:  84 PR Interval:    QRS Duration:  96 QT Interval:  357 QTC Calculation: 422 R Axis:   82  Text Interpretation: Atrial fibrillation Borderline right axis deviation Repol abnrm suggests ischemia,  diffuse leads Confirmed by Freddi Hamilton [phone]) on 01/08/2025 3:12:59 PM  Radiology: [facility] Chest Port 1 View Result Date: 01/08/2025 CLINICAL DATA:  Hypotension. EXAM: PORTABLE CHEST 1 VIEW COMPARISON:  05/07/2015 FINDINGS: Midline trachea. Normal heart size. Atherosclerosis in the transverse aorta. No pleural effusion or pneumothorax. Clear lungs. IMPRESSION: No acute cardiopulmonary disease. Aortic Atherosclerosis (ICD10-I70.0). Electronically Signed   By: Rockey Kilts M.D.   On: 01/08/2025 15:18     .Critical Care  Performed by: Freddi Hamilton, MD Authorized by: Freddi Hamilton, MD   Critical care provider statement:    Critical care time (minutes):  40   Critical care time was exclusive of:  Separately billable procedures and treating other patients   Critical care was necessary to treat or prevent  imminent or life-threatening deterioration of the following conditions:  Shock, sepsis and renal failure   Critical care was time spent personally by me on the following activities:  Development of treatment plan with patient or surrogate, discussions with consultants, evaluation of patient's response to treatment, examination of patient, ordering and review of laboratory studies, ordering and review of radiographic studies, ordering and performing treatments and interventions, pulse oximetry, re-evaluation of patient's condition and review of old charts    Medications Ordered in the ED  lactated ringers  infusion (has no administration in time range)  lactated ringers  bolus 1,000 mL (has no administration in time range)    And  lactated ringers  bolus 1,000 mL (has no administration in time range)    And  lactated ringers  bolus 1,000 mL (has no administration in time range)  ceFEPIme  (MAXIPIME ) 2 g in sodium chloride  0.9 % 100 mL IVPB (has no administration in time range)  metroNIDAZOLE  (FLAGYL ) IVPB 500 mg (has no administration in time range)  vancomycin  (VANCOCIN ) IVPB 1000 mg/200 mL  premix (has no administration in time range)                                    Medical Decision Making Amount and/or Complexity of Data Reviewed External Data Reviewed: notes. Labs: ordered.    Details: Lactic acidosis Radiology: ordered and independent interpretation performed.    Details: No pneumonia ECG/medicine tests: ordered and independent interpretation performed.    Details: A-fib  Risk Prescription drug management.   Patient presents with significant hypotension.  Resuscitated with IV fluids with good response to her blood pressure.  Shortly after initial workup, sisters arrived and stated that she has actually been in bed for several days and not answering the phone and likely not eating and drinking.  Similar to when she was admitted in November with a kidney injury.  Unclear cause.  Has chronic diarrhea.  Patient denies any falls.  No focal weakness on exam.  Treated broadly for possible sepsis with fluids and antibiotics.  Workup still pending.  Care transferred to Dr. Bernard.     Final diagnoses:  Shock [facility])    ED Discharge Orders     None          Freddi Hamilton, MD 01/08/25 1605  "

## 2025-01-08 NOTE — Progress Notes (Addendum)
 Critical care attending attestation note:  Patient seen and examined and relevant ancillary tests reviewed.  I agree with the assessment and plan of care as outlined by Cogar, Bianca C, NP   Synopsis of assessment and plan:  58y F with history of A-fib-on Pradaxa , colon CA s/p resection & radiation, chronic diarrhea>1year presents with AMS, and undifferentiated shock requiring vasopressors.  After resuscitation, feels much better on levo 10 MAP> 80s, sats well on nasal cannula affecting airway and conversational Chest clear to auscultation, abdomen soft nontender nondistended, moving all extremities, warm no edema  Acute metabolic encephalopathy-improving with resuscitation and vasopressors Undifferentiated shock Septic shock Leukocytosis WBC 15 AKI on CKD Metabolic acidosis likely due to above Lactic acidosis A-fib on Pradaxa  See s/p resection and radiation, Insulin -dependent DM   Plan - ICU monitoring and management - S/p 30cc/kg fluid - S/p 1 dose of Vanco cefepime  and Flagyl  - Continue Vanco and Zosyn(due to GFR to avoid neurotoxicity from cefepime ) - Infectious workup - Heparin  GTT for A-fib - Wean off levo as tolerated to maintain MAP> 65 - Complete echo - Continuous pulse ox and cardiac monitoring - Bicarb gtt. for AKI and metabolic acidosis - Glucose management - Monitor replace electrolyte as needed - Trend labs - Updated 2 sisters at the bedside  CRITICAL CARE Performed by: Lenny Drought, MD  [facility] Prefer epic messenger for cross cover needs   My critical care time: 35 minutes  Critical care time was exclusive of separately billable procedures and treating other patients.  Critical care was necessary to treat or prevent imminent or life-threatening deterioration.  Critical care was time spent personally by me on the following activities: development of treatment plan with patient and/or surrogate as well as nursing,  discussions with consultants, evaluation of patient's response to treatment, examination of patient, obtaining history from patient or surrogate, ordering and performing treatments and interventions, ordering and review of laboratory studies, ordering and review of radiographic studies, pulse oximetry, re-evaluation of patient's condition and participation in multidisciplinary rounds.

## 2025-01-08 NOTE — ED Notes (Signed)
 Pt has raynauds, poor O2 signal.

## 2025-01-08 NOTE — Progress Notes (Signed)
 eLink Physician-Brief Progress Note Patient Name: CHASMINE LENDER DOB: 03-27-1951 MRN: [id number]   Date of Service  01/08/2025  HPI/Events of Note  74 year old female with a history of chronic diarrhea who presented to the emergency department with altered mental status.  Found to be hypotensive with AKI.  Also with lactic acidosis.  Fluid resuscitated in the ED. Medical history significant for A-fib on Pradaxa  for secondary stroke prevention.  eICU Interventions  Patient's chart reviewed.  Pertinent labs and imaging studies reviewed.  Video assessment of patient done.  Appears comfortable, sitting up in a chair.  Says she generally feels unwell but cannot pinpoint what exactly is wrong. Impression: Shock, most likely septic Lactic acidosis Chronic diarrhea  Has been volume resuscitated with fluid boluses.  Continue with maintenance fluids Currently on norepinephrine  with decreasing doses Broaden antibiotics pending culture data finalization Anticoagulation has been switched from Pradaxa  to heparin  for secondary stroke prevention. Check cortisol stim test in the morning Plan of care discussed with bedside      Intervention Category Evaluation Type: New Patient Evaluation  Jerilynn Berg 01/08/2025, 9:56 PM

## 2025-01-08 NOTE — ED Triage Notes (Signed)
 Pt bib [facility] from home. Ems called out for near syncope, weakness, and dizzy. Initial BP of 50/30. Ems gave 500ml of NS. BP 58/30 after 500 bolus. Ems started epi drip and stopped after consistent Bps around 130/100. BP tanked so Epi restarted giving 4mcg/min. Total fluids given 700ml. No O2 at home. Diarrhea for past year. No cp.   18 LAC Last vitals: 78/30, HR 90-120 in afib

## 2025-01-09 ENCOUNTER — Inpatient Hospital Stay (HOSPITAL_COMMUNITY)

## 2025-01-09 DIAGNOSIS — I480 Paroxysmal atrial fibrillation: Secondary | ICD-10-CM

## 2025-01-09 DIAGNOSIS — D72829 Elevated white blood cell count, unspecified: Secondary | ICD-10-CM

## 2025-01-09 DIAGNOSIS — R578 Other shock: Secondary | ICD-10-CM

## 2025-01-09 DIAGNOSIS — R579 Shock, unspecified: Secondary | ICD-10-CM

## 2025-01-09 DIAGNOSIS — E1165 Type 2 diabetes mellitus with hyperglycemia: Secondary | ICD-10-CM

## 2025-01-09 DIAGNOSIS — E876 Hypokalemia: Secondary | ICD-10-CM | POA: Diagnosis not present

## 2025-01-09 DIAGNOSIS — R680 Hypothermia, not associated with low environmental temperature: Secondary | ICD-10-CM | POA: Diagnosis not present

## 2025-01-09 DIAGNOSIS — R197 Diarrhea, unspecified: Secondary | ICD-10-CM

## 2025-01-09 DIAGNOSIS — N179 Acute kidney failure, unspecified: Secondary | ICD-10-CM | POA: Diagnosis not present

## 2025-01-09 LAB — PHOSPHORUS: Phosphorus: 3.2 mg/dL (ref 2.5–4.6)

## 2025-01-09 LAB — APTT
aPTT: 154 s — ABNORMAL HIGH (ref 24–36)
aPTT: >200 s (ref 24–36)
aPTT: 57 s — ABNORMAL HIGH (ref 24–36)
aPTT: 64 s — ABNORMAL HIGH (ref 24–36)

## 2025-01-09 LAB — CBC
HCT: 32.4 % — ABNORMAL LOW (ref 36.0–46.0)
Hemoglobin: 11.3 g/dL — ABNORMAL LOW (ref 12.0–15.0)
MCH: 31.2 pg (ref 26.0–34.0)
MCHC: 34.9 g/dL (ref 30.0–36.0)
MCV: 89.5 fL (ref 80.0–100.0)
Platelets: 147 10*3/uL — ABNORMAL LOW (ref 150–400)
RBC: 3.62 MIL/uL — ABNORMAL LOW (ref 3.87–5.11)
RDW: 14.5 % (ref 11.5–15.5)
WBC: 12 10*3/uL — ABNORMAL HIGH (ref 4.0–10.5)
nRBC: 0 % (ref 0.0–0.2)

## 2025-01-09 LAB — ECHOCARDIOGRAM COMPLETE
AR max vel: 1.52 cm2
AV Area VTI: 1.86 cm2
AV Area mean vel: 1.62 cm2
AV Mean grad: 14 mmHg
AV Peak grad: 26.2 mmHg
Ao pk vel: 2.56 m/s
Area-P 1/2: 4.44 cm2
Calc EF: 79.3 %
Height: 68 in
MV VTI: 2.65 cm2
S' Lateral: 2.5 cm
Single Plane A2C EF: 80.2 %
Single Plane A4C EF: 80.3 %
Weight: 3361.57 [oz_av]

## 2025-01-09 LAB — BASIC METABOLIC PANEL WITH GFR
Anion gap: 16 — ABNORMAL HIGH (ref 5–15)
Anion gap: 15 (ref 5–15)
BUN: 94 mg/dL — ABNORMAL HIGH (ref 8–23)
BUN: 78 mg/dL — ABNORMAL HIGH (ref 8–23)
CO2: 13 mmol/L — ABNORMAL LOW (ref 22–32)
CO2: 14 mmol/L — ABNORMAL LOW (ref 22–32)
Calcium: 8.3 mg/dL — ABNORMAL LOW (ref 8.9–10.3)
Calcium: 8 mg/dL — ABNORMAL LOW (ref 8.9–10.3)
Chloride: 108 mmol/L (ref 98–111)
Chloride: 109 mmol/L (ref 98–111)
Creatinine, Ser: 3.96 mg/dL — ABNORMAL HIGH (ref 0.44–1.00)
Creatinine, Ser: 2.55 mg/dL — ABNORMAL HIGH (ref 0.44–1.00)
GFR, Estimated: 11 mL/min — ABNORMAL LOW
GFR, Estimated: 19 mL/min — ABNORMAL LOW
Glucose, Bld: 137 mg/dL — ABNORMAL HIGH (ref 70–99)
Glucose, Bld: 137 mg/dL — ABNORMAL HIGH (ref 70–99)
Potassium: 3 mmol/L — ABNORMAL LOW (ref 3.5–5.1)
Potassium: 2.9 mmol/L — ABNORMAL LOW (ref 3.5–5.1)
Sodium: 136 mmol/L (ref 135–145)
Sodium: 139 mmol/L (ref 135–145)

## 2025-01-09 LAB — HEPARIN LEVEL (UNFRACTIONATED)
Heparin Unfractionated: 0.33 [IU]/mL (ref 0.30–0.70)
Heparin Unfractionated: 0.47 [IU]/mL (ref 0.30–0.70)

## 2025-01-09 LAB — GLUCOSE, CAPILLARY
Glucose-Capillary: 131 mg/dL — ABNORMAL HIGH (ref 70–99)
Glucose-Capillary: 137 mg/dL — ABNORMAL HIGH (ref 70–99)
Glucose-Capillary: 207 mg/dL — ABNORMAL HIGH (ref 70–99)
Glucose-Capillary: 266 mg/dL — ABNORMAL HIGH (ref 70–99)
Glucose-Capillary: 136 mg/dL — ABNORMAL HIGH (ref 70–99)

## 2025-01-09 LAB — MAGNESIUM: Magnesium: 1.2 mg/dL — ABNORMAL LOW (ref 1.7–2.4)

## 2025-01-09 LAB — CORTISOL: Cortisol, Plasma: 10.8 ug/dL

## 2025-01-09 MED ORDER — METRONIDAZOLE 500 MG/100ML IV SOLN
500.0000 mg | Freq: Two times a day (BID) | INTRAVENOUS | Status: DC
  Administered 2025-01-09 – 2025-01-10 (×2): 500 mg via INTRAVENOUS
  Filled 2025-01-09 (×2): qty 100

## 2025-01-09 MED ORDER — POTASSIUM CHLORIDE 10 MEQ/100ML IV SOLN: 10.0000 meq | INTRAVENOUS | Status: DC

## 2025-01-09 MED ORDER — SODIUM BICARBONATE 8.4 % IV SOLN
50.0000 meq | Freq: Once | INTRAVENOUS | Status: AC
  Administered 2025-01-09: 50 meq via INTRAVENOUS
  Filled 2025-01-09: qty 50

## 2025-01-09 MED ORDER — POTASSIUM CHLORIDE 10 MEQ/100ML IV SOLN
10.0000 meq | INTRAVENOUS | Status: AC
  Administered 2025-01-09 (×3): 10 meq via INTRAVENOUS
  Filled 2025-01-09 (×3): qty 100

## 2025-01-09 MED ORDER — NOREPINEPHRINE 4 MG/250ML-% IV SOLN
0.0000 ug/min | INTRAVENOUS | Status: DC
  Administered 2025-01-09: 2 ug/min via INTRAVENOUS

## 2025-01-09 MED ORDER — SODIUM CHLORIDE 0.9 % IV SOLN
500.0000 mg | Freq: Two times a day (BID) | INTRAVENOUS | Status: DC
  Administered 2025-01-09: 500 mg via INTRAVENOUS
  Filled 2025-01-09 (×2): qty 10

## 2025-01-09 MED ORDER — SODIUM CHLORIDE 0.9 % IV SOLN
2.0000 g | INTRAVENOUS | Status: DC
  Administered 2025-01-09: 2 g via INTRAVENOUS
  Filled 2025-01-09: qty 12.5

## 2025-01-09 MED ORDER — POTASSIUM CHLORIDE 10 MEQ/100ML IV SOLN
10.0000 meq | INTRAVENOUS | Status: AC
  Administered 2025-01-10 (×3): 10 meq via INTRAVENOUS
  Filled 2025-01-09 (×2): qty 100

## 2025-01-09 MED ORDER — POTASSIUM CHLORIDE 2 MEQ/ML IV SOLN
INTRAVENOUS | Status: AC
  Filled 2025-01-09: qty 1000

## 2025-01-09 MED ORDER — VANCOMYCIN VARIABLE DOSE PER UNSTABLE RENAL FUNCTION (PHARMACIST DOSING): Status: DC

## 2025-01-09 MED ORDER — PERFLUTREN LIPID MICROSPHERE
1.0000 mL | INTRAVENOUS | Status: AC | PRN
  Administered 2025-01-09: 4 mL via INTRAVENOUS

## 2025-01-09 MED ORDER — HEPARIN (PORCINE) 25000 UT/250ML-% IV SOLN
1250.0000 [IU]/h | INTRAVENOUS | Status: DC
  Administered 2025-01-10: 1200 [IU]/h via INTRAVENOUS
  Filled 2025-01-09: qty 250

## 2025-01-09 MED ORDER — MAGNESIUM SULFATE 4 GM/100ML IV SOLN
4.0000 g | Freq: Once | INTRAVENOUS | Status: AC
  Administered 2025-01-09: 4 g via INTRAVENOUS
  Filled 2025-01-09: qty 100

## 2025-01-09 MED ORDER — POTASSIUM CHLORIDE CRYS ER 20 MEQ PO TBCR: 20.0000 meq | EXTENDED_RELEASE_TABLET | Freq: Once | ORAL | Status: DC

## 2025-01-09 MED ORDER — POTASSIUM CHLORIDE CRYS ER 20 MEQ PO TBCR
40.0000 meq | EXTENDED_RELEASE_TABLET | Freq: Once | ORAL | Status: DC
  Filled 2025-01-09: qty 2

## 2025-01-09 MED ORDER — LACTATED RINGERS IV BOLUS: 500.0000 mL | Freq: Once | INTRAVENOUS | Status: DC

## 2025-01-09 MED ORDER — LACTATED RINGERS IV BOLUS: 1000.0000 mL | Freq: Once | INTRAVENOUS | Status: AC

## 2025-01-09 MED ORDER — LACTATED RINGERS IV BOLUS: 1000.0000 mL | Freq: Once | INTRAVENOUS | Status: DC

## 2025-01-09 MED ORDER — VANCOMYCIN HCL IN DEXTROSE 1-5 GM/200ML-% IV SOLN
1000.0000 mg | Freq: Once | INTRAVENOUS | Status: AC
  Administered 2025-01-09: 1000 mg via INTRAVENOUS
  Filled 2025-01-09: qty 200

## 2025-01-09 NOTE — Progress Notes (Signed)
 eLink Physician-Brief Progress Note Patient Name: Ann [name] DOB: Jul 26, 1951 MRN: [id number]   Date of Service  01/09/2025  HPI/Events of Note  Patient has an amoxicillin allergy with the reaction being hives.  DC Zosyn.  eICU Interventions  Will use meropenem  for broad coverage.  Discussed with pharmacist.     Intervention Category Minor Interventions: Routine modifications to care plan (e.g. PRN medications for pain, fever)  Jerilynn Berg 01/09/2025, 12:41 AM

## 2025-01-09 NOTE — Progress Notes (Addendum)
 eLink Physician-Brief Progress Note Patient Name: Ann [name] DOB: 29-Oct-1951 MRN: [id number]   Date of Service  01/09/2025  HPI/Events of Note  pt BP low, 87/43 map 56, nurse asking if you want to restart Levo  r give fluids  Heart rate in the low 50's  eICU Interventions  Looks nontoxic Amp of bicarb Recheck BMP Will resume levo but notify if goes above 5mcg/min   12 lead for some brady: nonischemic; f/u BMP whenever it results  BMP --> K/Mg ordered  Intervention Category Intermediate Interventions: Other:  Toribio JAYSON Sharps 01/09/2025, 8:40 PM

## 2025-01-09 NOTE — Progress Notes (Deleted)
 PHARMACY - ANTICOAGULATION CONSULT NOTE  Pharmacy Consult for IV heparin  Indication: atrial fibrillation  Allergies[1]  Patient Measurements: Height: 5' 8 (172.7 cm) Weight: 95.3 kg (210 lb 1.6 oz) IBW/kg (Calculated) : 63.9 HEPARIN  DW (KG): 85.6  Vital Signs: Temp: 97.9 F (36.6 C) (01/30 1115) Temp Source: Oral (01/30 1115) BP: 136/68 (01/30 0830) Pulse Rate: 69 (01/30 0830)  Labs: Recent Labs    01/08/25 1425 01/08/25 1957 01/09/25 0544 01/09/25 0819  HGB 12.4  --   --  11.3*  HCT 36.7  --   --  32.4*  PLT 185  --   --  147*  APTT  --   --  154* >200*  LABPROT 30.8*  --   --   --   INR 2.8*  --   --   --   HEPARINUNFRC  --   --  0.33 0.47  CREATININE 5.82* 4.81* 3.96*  --   CKTOTAL 28*  --   --   --     Estimated Creatinine Clearance: 14.8 mL/min (A) (by C-G formula based on SCr of 3.96 mg/dL (H)).   Assessment: 74yo F presenting with hypotension. Pt on dabigatran  PTA (LD per patient was 1/29 AM) for atrial fibrillation. Pharmacy consulted for heparin  gtt while PO anticoagulation is held.    HL 0.33>0.47 this AM on repeat both therapeutic  aPTT 154>200 this AM on repeat both elevated  Given anti-XA level is therapeutic will monitor using HL  aPTT is going to be prolonged w reported recent pradaxa  usage  Goal of Therapy:  Heparin  level 0.3-0.7 units/ml Monitor platelets by anticoagulation protocol: Yes   Plan:  Decrease heparin  infusion slightly to 1200 units/hr to maintain therapeutic range, noted increase in level this AM Check anti-Xa level in 8 hours and daily while on heparin  Continue to monitor H&H and platelets  Sharyne Glatter, PharmD, BCCCP Critical Care Clinical Pharmacist 01/09/2025 11:36 AM     [1]  Allergies Allergen Reactions   Amoxicillin Anaphylaxis, Shortness Of Breath and Swelling    Has patient had a PCN reaction causing immediate rash, facial/tongue/throat swelling, SOB or lightheadedness with hypotension: Yes Has patient  had a PCN reaction causing severe rash involving mucus membranes or skin necrosis: No Has patient had a PCN reaction that required hospitalization No Has patient had a PCN reaction occurring within the last 10 years: Yes   Ampicillin    Codeine Itching    Tolerates hydrocodone    Ezetimibe    Livalo [Pitavastatin]    Statins Other (See Comments)    Muscle skeletal side effects

## 2025-01-09 NOTE — Progress Notes (Signed)
 Pharmacy Antibiotic Note  Ann [name] is a 74 y.o. female admitted on 01/08/2025 with sepsis.  Pharmacy has been consulted for vancomycin  and meropenem  dosing.  Pt w/ AKI; baseline SCr ~1, now 5.  Plan: Rec'd vanc 1g in ED; will give additional vanc 1g to complete load then monitor SCr +/- vanc levels prior to redosing. Rec'd cefepime  and metronidazole  in ED; start meropenem  500mg  IV Q12H.  Height: 5' 8 (172.7 cm) Weight: 99 kg (218 lb 3.2 oz) IBW/kg (Calculated) : 63.9  Temp (24hrs), Avg:97.3 F (36.3 C), Min:95.9 F (35.5 C), Max:98 F (36.7 C)  Recent Labs  Lab 01/08/25 1425 01/08/25 1446 01/08/25 1740 01/08/25 1957 01/08/25 2123  WBC 15.3*  --   --   --   --   CREATININE 5.82*  --   --  4.81*  --   LATICACIDVEN  --  2.8* 2.2*  --  1.7    Estimated Creatinine Clearance: 12.4 mL/min (A) (by C-G formula based on SCr of 4.81 mg/dL (H)).    Allergies[1]  Thank you for allowing pharmacy to be a part of this patients care.  Marvetta Dauphin, PharmD, BCPS  01/09/2025 12:46 AM     [1]  Allergies Allergen Reactions   Amoxicillin Anaphylaxis, Shortness Of Breath and Swelling    Has patient had a PCN reaction causing immediate rash, facial/tongue/throat swelling, SOB or lightheadedness with hypotension: Yes Has patient had a PCN reaction causing severe rash involving mucus membranes or skin necrosis: No Has patient had a PCN reaction that required hospitalization No Has patient had a PCN reaction occurring within the last 10 years: Yes   Ampicillin    Codeine Itching    Tolerates hydrocodone    Ezetimibe    Livalo [Pitavastatin]    Statins Other (See Comments)    Muscle skeletal side effects

## 2025-01-09 NOTE — Progress Notes (Signed)
 PHARMACY - ANTICOAGULATION CONSULT NOTE  Pharmacy Consult for IV heparin  Indication: atrial fibrillation  Allergies[1]  Patient Measurements: Height: 5' 8 (172.7 cm) Weight: 95.3 kg (210 lb 1.6 oz) IBW/kg (Calculated) : 63.9 HEPARIN  DW (KG): 85.6  Vital Signs: Temp: 97.6 F (36.4 C) (01/30 1521) Temp Source: Oral (01/30 1521) BP: 131/66 (01/30 1900) Pulse Rate: 65 (01/30 1900)  Labs: Recent Labs    01/08/25 1425 01/08/25 1957 01/09/25 0544 01/09/25 0819 01/09/25 1647  HGB 12.4  --   --  11.3*  --   HCT 36.7  --   --  32.4*  --   PLT 185  --   --  147*  --   APTT  --   --  154* >200* 57*  LABPROT 30.8*  --   --   --   --   INR 2.8*  --   --   --   --   HEPARINUNFRC  --   --  0.33 0.47  --   CREATININE 5.82* 4.81* 3.96*  --   --   CKTOTAL 28*  --   --   --   --     Estimated Creatinine Clearance: 14.8 mL/min (A) (by C-G formula based on SCr of 3.96 mg/dL (H)).   Assessment: 74yo F presenting with hypotension. Pt on dabigatran  PTA (LD unknown). Pharmacy consulted for heparin . Baseline CBC stable.  **Dabigatran  does not affect heparin  (anti-Xa) levels so will be fine to use these for monitoring without aPTT**  Of note, pt with therapeutic heparin  level x 2 on heparin  1200 units/hr. Held this a.m. due to aPTT >200 (?effects of dabigatran ). No bleeding noted. aPTT now down to 57 sec - Dr. Claudene ok to restart heparin  gtt.  Goal of Therapy:  Heparin  level 0.3-0.7 units/ml Monitor platelets by anticoagulation protocol: Yes   Plan:  Restart heparin  infusion at 1200 units/hr Check anti-Xa level in 8 hours and daily while on heparin  Continue to monitor H&H and platelets  Vito Ralph, PharmD, BCPS Please see amion for complete clinical pharmacist phone list 01/09/2025 8:28 PM        [1]  Allergies Allergen Reactions   Amoxicillin Anaphylaxis, Shortness Of Breath and Swelling    Has patient had a PCN reaction causing immediate rash, facial/tongue/throat  swelling, SOB or lightheadedness with hypotension: Yes Has patient had a PCN reaction causing severe rash involving mucus membranes or skin necrosis: No Has patient had a PCN reaction that required hospitalization No Has patient had a PCN reaction occurring within the last 10 years: Yes   Ampicillin    Codeine Itching    Tolerates hydrocodone    Ezetimibe    Livalo [Pitavastatin]    Statins Other (See Comments)    Muscle skeletal side effects

## 2025-01-09 NOTE — H&P (Signed)
 "  NAME:  Ann Ann [name], MRN:  [id number], DOB:  [birth year], LOS: 1 ADMISSION DATE:  01/08/2025, CONSULTATION DATE:  01/08/2025 REFERRING MD:  Dr. Franky Gaul, CHIEF COMPLAINT:  Sepsis   History of Present Illness:  Ann Ann [name] is a 74 year old female with a past medical history of atrial fibrillation on pradaxa , colon cancer s/p resection and radiation, chronic diarrhea (of 1 year), hypertension, hypothyroidism, DM type II on long term insulin , OSA not on CPAP, and prior basal ganglia stroke without residual symptoms who presented to [facility] with acute hypotension and weakness.  EMS called to home by family after Ann Ann [name] reported dizziness and lightheadedness.  On arrival EMS noted systolic BP in Ann 50s, Ann Ann [name] was given 500ccs of fluids with no response and started on Epi gtt.  Reports chronic history of diarrhea for at least one year which has remained stable.    In ED Ann Ann [name] received 4L LR, started on Levophed  12mcg/min (started on Epi gtt on EMS), Cefepime  2g, Vanc 1000mg , Flagyl  500mg  once.   On assessment, Ann Ann [name] is awake, alert and interactive.  Ann Ann [name] is able to participate in history, Ann Ann [name] has felt unwell for a few days with decreased appetite.  No recent illness, fever, headache, chest pain or cough.  Family at bedside reports at least 3 weeks of intermittent confusion and speech difficulty.   Pertinent  Medical History   Past Medical History:  Diagnosis Date   Acute metabolic acidosis 11/09/2024   Arthritis    osteoarthritis- knees.   Atrial fibrillation (HCC)    Dr. Debby status post cardioversion chronic anticoagulation   Basal ganglia stroke (HCC) 07/24/2023   Carotid artery occlusion    Cellulitis of right lower extremity 12/30/2020   Chicken pox    Colon cancer (HCC) 2001   Radiation, chemo.  Adenocarcinoma of Ann colon.  Followed by Dr. Dallie   Complication of anesthesia    sensitive to narcoticsextra sleepy. Versed - no memory recall for 4 days  postop   Diabetes mellitus without complication (HCC)    oral and Insulin    Diarrhea 07/09/2023   Diverticulosis 07/23/2023   Hepatitis    sub clinical case of Hepatitis in late 70'shas immunities   Hypertension    Hypothyroidism    Laceration of head 08/04/2019   8 cm laceration forehead/scalp after tripping when mowing her lawn.   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   Ovarian teratoma, right 09/2000   Oophorectomy   Sleep apnea    unable to tolreate cpap   Wisdom teeth extracted    Word finding difficulty 07/09/2023     Significant Hospital Events: Including procedures, antibiotic start and stop dates in addition to other pertinent events   1/29 admit to ICU on levophed  12mcg/min 1/30 still on low-dose levo.  Interim History / Subjective:  Ann [name] was slightly drowsy today but appropriate.  Alert and oriented x 3.  Still on low-dose Levophed  better than before.  However not able to provide much history.  Objective    Blood pressure (!) 131/51, pulse (!) 55, temperature 97.6 F (36.4 C), temperature source Oral, resp. rate 17, height 5' 8 (1.727 m), weight 95.3 kg, SpO2 99%.        Intake/Output Summary (Last 24 hours) at 01/09/2025 1622 Last data filed at 01/09/2025 1300 Gross per 24 hour  Intake  6660.93 ml  Output 945 ml  Net 5715.93 ml   Filed Weights   01/08/25 1422 01/09/25 0500  Weight: 99 kg 95.3 kg    Examination: General: Elderly female not in distress. Dry mucous membrane and reduced skin turgor Lungs: clear to auscultation bilaterally.  Heart: regular rate rhythm, no murmur appreciated.  Abdomen: non tender, non distended. Normal BS.  Neuro: axox 3.  Moving all extremities but unable to give good history   Resolved problem list   Assessment and Plan  # Shock likely due to hypovolemia and dehydration # Lactic Acidosis-resolved #  Leukocytosis  # Hypothermia  - Levophed  requirements improving. - Status post 4 L boluses in Ann ER and I gave her additional 1 L bolus.  In addition was on continuous fluids.  Levophed  requirement improving.  Clinically appears dehydrated. -Cover with antibiotics for now.  Penicillin allergic.  On vancomycin  cefepime  and Flagyl . -Less likely infectious.  If infectious workup is negative will consider discontinuing antibiotics. -Chest x-ray negative and CT chest abdomen and pelvis has been negative as well.  # Diarrhea, chronic -worsening  - GI panel and cdiff ordered and pending  # Atrial Fibrillation, Paroxysmal  # Coagulopathy due to medication  - Holding: Pradaxa , Benicar , and metoprolol  - Heparin  drip was started which was discontinued as APTT was greater than 200. -With AKI Pradaxa  effect will be longer.  Will resume heparin  drip when APTT is less than 200. - TTE ordered   # DM type II with Hyperglycemia with long term insulin  use  # Hypothyroidism - Continue SSI insulin , BG goal 100-180 - Continue Synthroid  50mcg  # AKI  # Anion Gap Metabolic Acidosis # Mild Hypokalemia  - baseline cr ~1; presented with creatinine of 5.82. - Improving creatinine with IV fluids.  # Limited Movement of RUE - history of falls, chronic changes.  # History of Stroke, basal ganglia - speech/aphasia difficulty ?recrudescence of symptoms in setting of sepsis  Hypomagnesemia: Hypokalemia: - Replace as needed  Continue ICU level of care today.  Will transfer out when Ann Ann [name] is off Levophed .  Labs   CBC: Recent Labs  Lab 01/08/25 1425 01/09/25 0819  WBC 15.3* 12.0*  NEUTROABS 13.3*  --   HGB 12.4 11.3*  HCT 36.7 32.4*  MCV 92.0 89.5  PLT 185 147*    Basic Metabolic Panel: Recent Labs  Lab 01/08/25 1425 01/08/25 1957 01/09/25 0544  NA 136 134* 136  K 3.4* 3.2* 3.0*  CL 104 105 108  CO2 9* 9* 13*  GLUCOSE 230* 171* 137*  BUN 108* 95* 94*  CREATININE 5.82* 4.81* 3.96*   CALCIUM  8.3* 8.2* 8.3*  MG  --   --  1.2*  PHOS  --   --  3.2   GFR: Estimated Creatinine Clearance: 14.8 mL/min (A) (by C-G formula based on SCr of 3.96 mg/dL (H)). Recent Labs  Lab 01/08/25 1425 01/08/25 1446 01/08/25 1740 01/08/25 2123 01/09/25 0819  WBC 15.3*  --   --   --  12.0*  LATICACIDVEN  --  2.8* 2.2* 1.7  --     Liver Function Tests: Recent Labs  Lab 01/08/25 1425  AST 18  ALT 8  ALKPHOS 107  BILITOT 0.5  PROT 6.6  ALBUMIN 3.5   No results for input(s): LIPASE, AMYLASE in Ann last 168 hours. No results for input(s): AMMONIA in Ann last 168 hours.  ABG    Component Value Date/Time   TCO2 15 (L) 11/08/2024 1312  Coagulation Profile: Recent Labs  Lab 01/08/25 1425  INR 2.8*    Cardiac Enzymes: Recent Labs  Lab 01/08/25 1425  CKTOTAL 28*    HbA1C: Hemoglobin A1C  Date/Time Value Ref Range Status  09/03/2024 09:01 AM 6.9 (A) 4.0 - 5.6 % Final  05/02/2024 09:57 AM 7.0 (A) 4.0 - 5.6 % Final  09/26/2019 12:00 AM 8.0  Final  06/27/2019 12:00 AM 8.2  Final   HbA1c, POC (prediabetic range)  Date/Time Value Ref Range Status  04/20/2020 10:24 AM 8.1 (A) 5.7 - 6.4 % Final   HbA1c, POC (controlled diabetic range)  Date/Time Value Ref Range Status  04/20/2020 10:24 AM 8.1 (A) 0.0 - 7.0 % Final   HbA1c POC (<> result, manual entry)  Date/Time Value Ref Range Status  04/20/2020 10:24 AM 8.1 4.0 - 5.6 % Final   Hgb A1c MFr Bld  Date/Time Value Ref Range Status  11/10/2024 12:07 PM 8.4 (H) 4.8 - 5.6 % Final    Comment:    (NOTE)         Prediabetes: 5.7 - 6.4         Diabetes: >6.4         Glycemic control for adults with diabetes: <7.0   07/18/2023 03:30 PM 9.5 (H) 4.8 - 5.6 % Final    Comment:    (NOTE) Pre diabetes:          5.7%-6.4%  Diabetes:              >6.4%  Glycemic control for   <7.0% adults with diabetes     CBG: Recent Labs  Lab 01/08/25 2350 01/09/25 0401 01/09/25 0744 01/09/25 1114 01/09/25 1518   GLUCAP 127* 131* 137* 207* 266*   Past Medical History:  Ann Ann [name],  has a past medical history of Acute metabolic acidosis (11/09/2024), Arthritis, Atrial fibrillation (HCC), Basal ganglia stroke (HCC) (07/24/2023), Carotid artery occlusion, Cellulitis of right lower extremity (12/30/2020), Chicken pox, Colon cancer (HCC) (2001), Complication of anesthesia, Diabetes mellitus without complication (HCC), Diarrhea (07/09/2023), Diverticulosis (07/23/2023), Hepatitis, Hypertension, Hypothyroidism, Laceration of head (08/04/2019), Melanoma (HCC), Mixed hyperlipidemia, Nose anomaly, Ovarian teratoma, right (09/2000), Sleep apnea, Wisdom teeth extracted, and Word finding difficulty (07/09/2023).   Surgical History:   Past Surgical History:  Procedure Laterality Date   ABDOMINAL HYSTERECTOMY  09/2000    Total hysterectomy and oophorectomy   ABDOMINAL WOUND DEHISCENCE Bilateral    multiple times / mesh use with multiple abdominal hernia repair   CARDIOVERSION  2014   Unsuccessful.  Ann [name] reports cardiac ablation was not felt to be appropriate next step by cardiology.   CHOLECYSTECTOMY  04/1998   '98 -Lapraroscopic   COLON RESECTION  2001   colon cancer   FOOT SURGERY Bilateral    multiple hammer toes and bunionectomies   KNEE ARTHROSCOPY Left 08/2006   meniscectomy   MELANOMA EXCISION     near scapula   TOTAL KNEE ARTHROPLASTY Right 01/24/2016   Procedure: RIGHT TOTAL KNEE ARTHROPLASTY;  Surgeon: Dempsey Moan, MD;  Location: [facility];  Service: Orthopedics;  Laterality: Right;     Social History:   reports that Ann Ann [name] quit smoking about 27 years ago. Her smoking use included cigarettes. Ann Ann [name] started smoking about 53 years ago. Ann Ann [name] has never used smokeless tobacco. Ann Ann [name] reports that Ann Ann [name] does not drink alcohol and does not use drugs.   Family History:  Her family history includes Arthritis in her mother; Atrial fibrillation in her mother; Brain cancer in her brother; Colon cancer in  her mother;  Diabetes in her brother, father, mother, and paternal grandfather; Heart attack in her father; Heart disease in her brother and father; Hypertension in her brother, maternal grandmother, paternal grandfather, and paternal grandmother; Stroke in her brother, father, maternal grandfather, maternal grandmother, paternal grandfather, and paternal grandmother.   Allergies Allergies[1]   Home Medications  Prior to Admission medications  Medication Sig Start Date End Date Taking? Authorizing Provider  allopurinol  (ZYLOPRIM ) 100 MG tablet Take 1 tablet (100 mg total) by mouth daily. 08/05/24   Kuneff, Renee A, DO  ALPRAZolam  (XANAX ) 0.25 MG tablet Take 1 tablet (0.25 mg total) by mouth daily as needed for anxiety. 02/19/24   Kuneff, Renee A, DO  amLODipine  (NORVASC ) 2.5 MG tablet Take 1 tablet (2.5 mg total) by mouth daily. 11/20/24   Kuneff, Renee A, DO  colchicine  0.6 MG tablet 2 tabs at onset of flare, followed by 1 tab 2 hours later. Then continue 1 tab daily until flare is resolved. 08/05/24   Kuneff, Renee A, DO  Continuous Glucose Sensor (DEXCOM G7 SENSOR) MISC 1 DEVICE BY DOES NOT APPLY ROUTE AS DIRECTED. 01/21/24   Shamleffer, Donell Cardinal, MD  cyanocobalamin  (VITAMIN B12) 1000 MCG/ML injection Inject 1 mL (1,000 mcg total) into Ann muscle every 14 (fourteen) days. 08/05/24   Kuneff, Renee A, DO  dabigatran  (PRADAXA ) 150 MG CAPS capsule Take 1 capsule (150 mg total) by mouth 2 (two) times daily. 12/16/24   Ladona Heinz, MD  EPIPEN 2-PAK 0.3 MG/0.3ML SOAJ injection Inject 0.3 mg into Ann skin as needed. Bee stings 11/11/15   [provider]  insulin  aspart (NOVOLOG  FLEXPEN) 100 UNIT/ML FlexPen Max daily 60 units 09/03/24   Shamleffer, Ibtehal Jaralla, MD  Insulin  Glargine (BASAGLAR  KWIKPEN) 100 UNIT/ML Inject 46 Units into Ann skin every morning. 09/03/24   Shamleffer, Ibtehal Jaralla, MD  Insulin  Pen Needle 30G X 6 MM MISC 1 Device by Does not apply route in Ann morning, at noon, in Ann evening,  and at bedtime. 09/03/24   Shamleffer, Ibtehal Jaralla, MD  levothyroxine  (SYNTHROID ) 50 MCG tablet Take 1 tablet (50 mcg total) by mouth daily before breakfast. 08/05/24   Kuneff, Renee A, DO  metoprolol  tartrate (LOPRESSOR ) 50 MG tablet Take 1 tablet (50 mg total) by mouth 2 (two) times daily. 11/20/24   Kuneff, Renee A, DO  olmesartan  (BENICAR ) 40 MG tablet Take 1 tablet (40 mg total) by mouth daily. 11/21/24   Kuneff, Renee A, DO  olmesartan  (BENICAR ) 40 MG tablet Take 1 tablet (40 mg total) by mouth daily. 11/21/24   Catherine, Renee A, DO  ONETOUCH ULTRA test strip SMARTSIG:Via Meter 10/18/20   [provider]  PRESCRIPTION MEDICATION Inject 50,000 mcg as directed every 14 (fourteen) days. Vitamin B12 injections    [provider]  triamcinolone  ointment (KENALOG ) 0.5 % Apply topically 2 (two) times daily as needed. 02/07/21   [provider]  Vitamin D , Ergocalciferol , (DRISDOL ) 1.25 MG (50000 UNIT) CAPS capsule TAKE 1 CAPSULE BY MOUTH ON MONDAY AND THURSDAY WITH FOOD 05/14/24   Kuneff, Renee A, DO  zolpidem  (AMBIEN ) 5 MG tablet Take 1 tablet (5 mg total) by mouth at bedtime as needed. 08/05/24   [name], DO    CRITICAL CARE Performed by: Sammi JONETTA Fredericks.     Total critical care time: 45 minutes   Critical care time was exclusive of separately billable procedures and treating other patients.   Critical care was necessary to treat or prevent imminent or  life-threatening deterioration.   Critical care was time spent personally by me on Ann following activities: development of treatment plan with Ann [name] and/or surrogate as well as nursing, discussions with consultants, evaluation of Ann [name]'s response to treatment, examination of Ann [name], obtaining history from Ann [name] or surrogate, ordering and performing treatments and interventions, ordering and review of laboratory studies, ordering and review of radiographic studies, pulse oximetry, re-evaluation of Ann [name]'s  condition and participation in multidisciplinary rounds.  Sammi JONETTA Fredericks, MD Pulmonary, Critical Care and Sleep Attending.   01/09/2025, 4:34 PM          [1]  Allergies Allergen Reactions   Amoxicillin Anaphylaxis, Shortness Of Breath and Swelling    Has Ann [name] had a PCN reaction causing immediate rash, facial/tongue/throat swelling, SOB or lightheadedness with hypotension: Yes Has Ann [name] had a PCN reaction causing severe rash involving mucus membranes or skin necrosis: No Has Ann [name] had a PCN reaction that required hospitalization No Has Ann [name] had a PCN reaction occurring within Ann last 10 years: Yes   Ampicillin    Codeine Itching    Tolerates hydrocodone    Ezetimibe    Livalo [Pitavastatin]    Statins Other (See Comments)    Muscle skeletal side effects   "

## 2025-01-10 DIAGNOSIS — E1165 Type 2 diabetes mellitus with hyperglycemia: Secondary | ICD-10-CM | POA: Diagnosis not present

## 2025-01-10 DIAGNOSIS — R571 Hypovolemic shock: Secondary | ICD-10-CM

## 2025-01-10 DIAGNOSIS — E8721 Acute metabolic acidosis: Secondary | ICD-10-CM | POA: Diagnosis not present

## 2025-01-10 DIAGNOSIS — I480 Paroxysmal atrial fibrillation: Secondary | ICD-10-CM | POA: Diagnosis not present

## 2025-01-10 DIAGNOSIS — R197 Diarrhea, unspecified: Secondary | ICD-10-CM | POA: Diagnosis not present

## 2025-01-10 DIAGNOSIS — E860 Dehydration: Secondary | ICD-10-CM

## 2025-01-10 DIAGNOSIS — N179 Acute kidney failure, unspecified: Secondary | ICD-10-CM | POA: Diagnosis not present

## 2025-01-10 DIAGNOSIS — E876 Hypokalemia: Secondary | ICD-10-CM | POA: Diagnosis not present

## 2025-01-10 LAB — GLUCOSE, CAPILLARY
Glucose-Capillary: 120 mg/dL — ABNORMAL HIGH (ref 70–99)
Glucose-Capillary: 124 mg/dL — ABNORMAL HIGH (ref 70–99)
Glucose-Capillary: 142 mg/dL — ABNORMAL HIGH (ref 70–99)
Glucose-Capillary: 153 mg/dL — ABNORMAL HIGH (ref 70–99)
Glucose-Capillary: 190 mg/dL — ABNORMAL HIGH (ref 70–99)
Glucose-Capillary: 209 mg/dL — ABNORMAL HIGH (ref 70–99)
Glucose-Capillary: 220 mg/dL — ABNORMAL HIGH (ref 70–99)

## 2025-01-10 LAB — BASIC METABOLIC PANEL WITH GFR
Anion gap: 13 (ref 5–15)
BUN: 72 mg/dL — ABNORMAL HIGH (ref 8–23)
CO2: 16 mmol/L — ABNORMAL LOW (ref 22–32)
Calcium: 7.9 mg/dL — ABNORMAL LOW (ref 8.9–10.3)
Chloride: 110 mmol/L (ref 98–111)
Creatinine, Ser: 2.07 mg/dL — ABNORMAL HIGH (ref 0.44–1.00)
GFR, Estimated: 24 mL/min — ABNORMAL LOW
Glucose, Bld: 162 mg/dL — ABNORMAL HIGH (ref 70–99)
Potassium: 3.2 mmol/L — ABNORMAL LOW (ref 3.5–5.1)
Sodium: 139 mmol/L (ref 135–145)

## 2025-01-10 LAB — APTT
aPTT: 191 s (ref 24–36)
aPTT: >200 s (ref 24–36)
aPTT: 69 s — ABNORMAL HIGH (ref 24–36)

## 2025-01-10 LAB — CBC
HCT: 30 % — ABNORMAL LOW (ref 36.0–46.0)
Hemoglobin: 10.4 g/dL — ABNORMAL LOW (ref 12.0–15.0)
MCH: 31.3 pg (ref 26.0–34.0)
MCHC: 34.7 g/dL (ref 30.0–36.0)
MCV: 90.4 fL (ref 80.0–100.0)
Platelets: 108 10*3/uL — ABNORMAL LOW (ref 150–400)
RBC: 3.32 MIL/uL — ABNORMAL LOW (ref 3.87–5.11)
RDW: 14.5 % (ref 11.5–15.5)
WBC: 8.4 10*3/uL (ref 4.0–10.5)
nRBC: 0 % (ref 0.0–0.2)

## 2025-01-10 LAB — VANCOMYCIN, RANDOM: Vancomycin Rm: 15 ug/mL

## 2025-01-10 LAB — HEPARIN LEVEL (UNFRACTIONATED): Heparin Unfractionated: 0.32 [IU]/mL (ref 0.30–0.70)

## 2025-01-10 LAB — LIPASE, BLOOD: Lipase: 70 U/L — ABNORMAL HIGH (ref 11–51)

## 2025-01-10 MED ORDER — SODIUM BICARBONATE 650 MG PO TABS
650.0000 mg | ORAL_TABLET | Freq: Three times a day (TID) | ORAL | Status: DC
  Administered 2025-01-10 – 2025-01-12 (×7): 650 mg via ORAL
  Filled 2025-01-10 (×6): qty 1

## 2025-01-10 MED ORDER — LACTATED RINGERS IV SOLN: INTRAVENOUS | Status: AC

## 2025-01-10 MED ORDER — SODIUM BICARBONATE 8.4 % IV SOLN
100.0000 meq | Freq: Once | INTRAVENOUS | Status: AC
  Administered 2025-01-10: 100 meq via INTRAVENOUS

## 2025-01-10 MED ORDER — POTASSIUM CHLORIDE 10 MEQ/100ML IV SOLN
10.0000 meq | INTRAVENOUS | Status: AC
  Administered 2025-01-10 (×4): 10 meq via INTRAVENOUS
  Filled 2025-01-10 (×4): qty 100

## 2025-01-10 MED ORDER — CALCIUM GLUCONATE-NACL 2-0.675 GM/100ML-% IV SOLN
2.0000 g | Freq: Once | INTRAVENOUS | Status: AC
  Administered 2025-01-10: 2000 mg via INTRAVENOUS
  Filled 2025-01-10: qty 100

## 2025-01-10 NOTE — Progress Notes (Signed)
 "  NAME:  Ann [name], MRN:  [id number], DOB:  Aug 07, 1951, LOS: 2 ADMISSION DATE:  01/08/2025, CONSULTATION DATE:  01/08/2025 REFERRING MD:  Dr. Franky Gaul, CHIEF COMPLAINT:  Sepsis   History of Present Illness:  [name] is a 74 year old female with a past medical history of atrial fibrillation on pradaxa , colon cancer s/p resection and radiation, chronic diarrhea (of 1 year), hypertension, hypothyroidism, DM type II on long term insulin , OSA not on CPAP, and prior basal ganglia stroke without residual symptoms who presented to [facility] with acute hypotension and weakness.  EMS called to home by family after Ms. [name] reported dizziness and lightheadedness.  On arrival EMS noted systolic BP in the 50s, she was given 500ccs of fluids with no response and started on Epi gtt.  Reports chronic history of diarrhea for at least one year which has remained stable.    In ED she received 4L LR, started on Levophed  12mcg/min (started on Epi gtt on EMS), Cefepime  2g, Vanc 1000mg , Flagyl  500mg  once.   On assessment, [name] is awake, alert and interactive.  She is able to participate in history, she has felt unwell for a few days with decreased appetite.  No recent illness, fever, headache, chest pain or cough.  Family at bedside reports at least 3 weeks of intermittent confusion and speech difficulty.   Pertinent  Medical History   Past Medical History:  Diagnosis Date   Acute metabolic acidosis 11/09/2024   Arthritis    osteoarthritis- knees.   Atrial fibrillation (HCC)    Dr. Debby status post cardioversion chronic anticoagulation   Basal ganglia stroke (HCC) 07/24/2023   Carotid artery occlusion    Cellulitis of right lower extremity 12/30/2020   Chicken pox    Colon cancer (HCC) 2001   Radiation, chemo.  Adenocarcinoma of the colon.  Followed by Dr. Dallie   Complication of anesthesia    sensitive to narcoticsextra sleepy. Versed - no memory recall for 4 days  postop   Diabetes mellitus without complication (HCC)    oral and Insulin    Diarrhea 07/09/2023   Diverticulosis 07/23/2023   Hepatitis    sub clinical case of Hepatitis in late 70'shas immunities   Hypertension    Hypothyroidism    Laceration of head 08/04/2019   8 cm laceration forehead/scalp after tripping when mowing her lawn.   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   Ovarian teratoma, right 09/2000   Oophorectomy   Sleep apnea    unable to tolreate cpap   Wisdom teeth extracted    Word finding difficulty 07/09/2023     Significant Hospital Events: Including procedures, antibiotic start and stop dates in addition to other pertinent events   1/29 admit to ICU on levophed  63mcg/min 1/30 still on low-dose levo.  Interim History / Subjective:  Patient is complaining of feeling generalized weak, denies any particular complaint like chest pain, shortness of breath, fever or chills Remains off Levophed , blood pressure has improved Afebrile No more diarrhea  Objective    Blood pressure (!) 127/56, pulse (!) 56, temperature (P) 97.6 F (36.4 C), temperature source (P) Oral, resp. rate 12, height 5' 8 (1.727 m), weight 96.6 kg, SpO2 98%.        Intake/Output Summary (Last 24 hours) at 01/10/2025 1043 Last data filed at 01/10/2025 1000 Gross per  24 hour  Intake 6097.64 ml  Output 2600 ml  Net 3497.64 ml   Filed Weights   01/08/25 1422 01/09/25 0500 01/10/25 0500  Weight: 99 kg 95.3 kg 96.6 kg    Examination: General: Chronically ill-appearing female, lying on the bed HEENT: [place]/AT, eyes anicteric.  moist mucus membranes Neuro: Alert, awake following commands Chest: Coarse breath sounds, no wheezes or rhonchi Heart: Irregularly irregular, no murmurs or gallops Abdomen: Soft, nontender, nondistended, bowel sounds present  Labs and images  reviewed  Patient Lines/Drains/Airways Status     Active Line/Drains/Airways     Name Placement date Placement time Site Days   Peripheral IV 01/08/25 Left Hand 01/08/25  2033  Hand  2   Peripheral IV 01/08/25 22 G 1 Distal;Left;Posterior Forearm 01/08/25  2220  Forearm  2   Urethral Catheter jennifer, RN Straight-tip 16 Fr. 01/08/25  2055  Straight-tip  2   Wound 01/08/25 2109 Irritant Contact Dermatitis Groin Bilateral 01/08/25  2109  Groin  2   Wound 01/08/25 2114 Irritant Contact Dermatitis Buttocks Bilateral 01/08/25  2114  Buttocks  2   Wound 01/08/25 2115 Other (Comment) Toe (Comment  which one) Right 01/08/25  2115  Toe (Comment  which one)  2            Resolved problem list   Assessment and Plan  Hypovolemic shock due to dehydration in the setting of chronic diarrhea  Lactic acidosis/acute metabolic acidosis  Chronic diarrhea, improved  Paroxysmal A-fib  Acute kidney injury, prerenal due to severe dehydration  Hypokalemia/hypomagnesemia Diabetes type 2 with hyperglycemia  Prior history of stroke   Shock is resolved Patient is off vasopressor support Continue maintenance IV fluid She does not have diarrhea anymore Serum bicarbonate is improving Will give her 2 A of bicarbonate Continue sodium bicarbonate  p.o. tablet 3 times daily Closely monitor and supplement electrolytes Serum creatinine is trending down, upon admission it was 5.8, now down to 2.0 Remained in A-fib with controlled rate Continue insulin  with CBG goal 140-180 Continue to hold antihypertensive for now She is on Pradaxa  for stroke prophylaxis at home, currently on heparin  infusion, switch back to oral anticoagulation prior to discharge I do not think patient has active infection, will stop antibiotics   Labs   CBC: Recent Labs  Lab 01/08/25 1425 01/09/25 0819 01/10/25 0436  WBC 15.3* 12.0* 8.4  NEUTROABS 13.3*  --   --   HGB 12.4 11.3* 10.4*  HCT 36.7 32.4* 30.0*  MCV 92.0 89.5 90.4   PLT 185 147* 108*    Basic Metabolic Panel: Recent Labs  Lab 01/08/25 1425 01/08/25 1957 01/09/25 0544 01/09/25 2100 01/10/25 0436  NA 136 134* 136 139 139  K 3.4* 3.2* 3.0* 2.9* 3.2*  CL 104 105 108 109 110  CO2 9* 9* 13* 14* 16*  GLUCOSE 230* 171* 137* 137* 162*  BUN 108* 95* 94* 78* 72*  CREATININE 5.82* 4.81* 3.96* 2.55* 2.07*  CALCIUM  8.3* 8.2* 8.3* 8.0* 7.9*  MG  --   --  1.2*  --   --   PHOS  --   --  3.2  --   --    GFR: Estimated Creatinine Clearance: 28.5 mL/min (A) (by C-G formula based on SCr of 2.07 mg/dL (H)). Recent Labs  Lab 01/08/25 1425 01/08/25 1446 01/08/25 1740 01/08/25 2123 01/09/25 0819 01/10/25 0436  WBC 15.3*  --   --   --  12.0* 8.4  LATICACIDVEN  --  2.8* 2.2* 1.7  --   --  Liver Function Tests: Recent Labs  Lab 01/08/25 1425  AST 18  ALT 8  ALKPHOS 107  BILITOT 0.5  PROT 6.6  ALBUMIN 3.5   Recent Labs  Lab 01/10/25 0436  LIPASE 70*   No results for input(s): AMMONIA in the last 168 hours.  ABG    Component Value Date/Time   TCO2 15 (L) 11/08/2024 1312     Coagulation Profile: Recent Labs  Lab 01/08/25 1425  INR 2.8*    Cardiac Enzymes: Recent Labs  Lab 01/08/25 1425  CKTOTAL 28*    HbA1C: Hemoglobin A1C  Date/Time Value Ref Range Status  09/03/2024 09:01 AM 6.9 (A) 4.0 - 5.6 % Final  05/02/2024 09:57 AM 7.0 (A) 4.0 - 5.6 % Final  09/26/2019 12:00 AM 8.0  Final  06/27/2019 12:00 AM 8.2  Final   HbA1c, POC (prediabetic range)  Date/Time Value Ref Range Status  04/20/2020 10:24 AM 8.1 (A) 5.7 - 6.4 % Final   HbA1c, POC (controlled diabetic range)  Date/Time Value Ref Range Status  04/20/2020 10:24 AM 8.1 (A) 0.0 - 7.0 % Final   HbA1c POC (<> result, manual entry)  Date/Time Value Ref Range Status  04/20/2020 10:24 AM 8.1 4.0 - 5.6 % Final   Hgb A1c MFr Bld  Date/Time Value Ref Range Status  11/10/2024 12:07 PM 8.4 (H) 4.8 - 5.6 % Final    Comment:    (NOTE)         Prediabetes: 5.7 -  6.4         Diabetes: >6.4         Glycemic control for adults with diabetes: <7.0   07/18/2023 03:30 PM 9.5 (H) 4.8 - 5.6 % Final    Comment:    (NOTE) Pre diabetes:          5.7%-6.4%  Diabetes:              >6.4%  Glycemic control for   <7.0% adults with diabetes     CBG: Recent Labs  Lab 01/09/25 1518 01/09/25 2003 01/10/25 0012 01/10/25 0358 01/10/25 0847  GLUCAP 266* 136* 124* 153* 142*      Valinda Novas, MD [facility] [facility] See Amion for pager If no response to pager, please call [phone] until 7pm After 7pm, Please call E-link [phone]  "

## 2025-01-10 NOTE — Progress Notes (Signed)
 PHARMACY - ANTICOAGULATION CONSULT NOTE  Pharmacy Consult for IV heparin  Indication: atrial fibrillation  Allergies[1]  Patient Measurements: Height: 5' 8 (172.7 cm) Weight: 95.3 kg (210 lb 1.6 oz) IBW/kg (Calculated) : 63.9 HEPARIN  DW (KG): 85.6  Vital Signs: Temp: 98.3 F (36.8 C) (01/31 0400) Temp Source: Oral (01/31 0400) BP: 120/62 (01/31 0500) Pulse Rate: 62 (01/31 0500)  Labs: Recent Labs    01/08/25 1425 01/08/25 1425 01/08/25 1957 01/09/25 0544 01/09/25 0819 01/09/25 1647 01/09/25 2100 01/10/25 0436  HGB 12.4  --   --   --  11.3*  --   --  10.4*  HCT 36.7  --   --   --  32.4*  --   --  30.0*  PLT 185  --   --   --  147*  --   --  108*  APTT  --    < >  --  154* >200* 57* 64* 191*  LABPROT 30.8*  --   --   --   --   --   --   --   INR 2.8*  --   --   --   --   --   --   --   HEPARINUNFRC  --   --   --  0.33 0.47  --   --  0.32  CREATININE 5.82*  --  4.81* 3.96*  --   --  2.55*  --   CKTOTAL 28*  --   --   --   --   --   --   --    < > = values in this interval not displayed.    Estimated Creatinine Clearance: 23 mL/min (A) (by C-G formula based on SCr of 2.55 mg/dL (H)).   Assessment: 74yo F presenting with hypotension. Pt on dabigatran  PTA (LD unknown). Pharmacy consulted for heparin . Baseline CBC stable.  **Dabigatran  does not affect heparin  (anti-Xa) levels so will be fine to use these for monitoring without aPTT**  AM: heparin  level therapeutic on heparin  1200 units/hr. Slight bleeding around IV site, no other signs per RN and CBC stable. Many factors contributing to falsely elevating the aPTT-not accurate  Goal of Therapy:  Heparin  level 0.3-0.7 units/ml Monitor platelets by anticoagulation protocol: Yes   Plan:  Continue heparin  infusion at 1200 units/hr Check anti-Xa level daily while on heparin  Continue to monitor H&H and platelets  Rutha Poplar, PharmD, BCPS Clinical Pharmacist 01/10/2025 5:40 AM     [1]  Allergies Allergen  Reactions   Amoxicillin Anaphylaxis, Shortness Of Breath and Swelling    Has patient had a PCN reaction causing immediate rash, facial/tongue/throat swelling, SOB or lightheadedness with hypotension: Yes Has patient had a PCN reaction causing severe rash involving mucus membranes or skin necrosis: No Has patient had a PCN reaction that required hospitalization No Has patient had a PCN reaction occurring within the last 10 years: Yes   Ampicillin    Codeine Itching    Tolerates hydrocodone    Ezetimibe    Livalo [Pitavastatin]    Statins Other (See Comments)    Muscle skeletal side effects

## 2025-01-10 NOTE — Progress Notes (Signed)
 Critical value APTT over 200.  Dr Harold notified and VO to stop heparin  1021.  No signs of bleeding and VSS.

## 2025-01-10 NOTE — Progress Notes (Signed)
 PHARMACY - ANTICOAGULATION CONSULT NOTE  Pharmacy Consult for IV heparin  Indication: atrial fibrillation  Allergies[1]  Patient Measurements: Height: 5' 8 (172.7 cm) Weight: 96.6 kg (212 lb 15.4 oz) IBW/kg (Calculated) : 63.9 HEPARIN  DW (KG): 85.6  Vital Signs: Temp: 98.3 F (36.8 C) (01/31 0400) Temp Source: Oral (01/31 0400) BP: 103/50 (01/31 0700) Pulse Rate: 54 (01/31 0700)  Labs: Recent Labs    01/08/25 1425 01/08/25 1957 01/09/25 0544 01/09/25 0819 01/09/25 1647 01/09/25 2100 01/10/25 0436  HGB 12.4  --   --  11.3*  --   --  10.4*  HCT 36.7  --   --  32.4*  --   --  30.0*  PLT 185  --   --  147*  --   --  108*  APTT  --    < > 154* >200* 57* 64* 191*  LABPROT 30.8*  --   --   --   --   --   --   INR 2.8*  --   --   --   --   --   --   HEPARINUNFRC  --   --  0.33 0.47  --   --  0.32  CREATININE 5.82*   < > 3.96*  --   --  2.55* 2.07*  CKTOTAL 28*  --   --   --   --   --   --    < > = values in this interval not displayed.    Estimated Creatinine Clearance: 28.5 mL/min (A) (by C-G formula based on SCr of 2.07 mg/dL (H)).   Assessment: 74yo F presenting with hypotension. Pt on dabigatran  PTA (LD unknown). Pharmacy consulted for heparin . Baseline CBC stable.  Dabigatran  >> Heparin  infusion  Heparin  level = 0.32, third therapeutic level on this dose   Goal of Therapy:  Heparin  level 0.3-0.7 units/ml Monitor platelets by anticoagulation protocol: Yes   Plan:  Continue heparin  infusion at 1250 units/hr Check anti-Xa level daily while on heparin  Continue to monitor H&H and platelets  Rankin Sams, PharmD, BCCCP Clinical Pharmacist    [1]  Allergies Allergen Reactions   Amoxicillin Anaphylaxis, Shortness Of Breath and Swelling    Has patient had a PCN reaction causing immediate rash, facial/tongue/throat swelling, SOB or lightheadedness with hypotension: Yes Has patient had a PCN reaction causing severe rash involving mucus membranes or skin necrosis:  No Has patient had a PCN reaction that required hospitalization No Has patient had a PCN reaction occurring within the last 10 years: Yes   Ampicillin    Codeine Itching    Tolerates hydrocodone    Ezetimibe    Livalo [Pitavastatin]    Statins Other (See Comments)    Muscle skeletal side effects

## 2025-01-11 LAB — GLUCOSE, CAPILLARY
Glucose-Capillary: 104 mg/dL — ABNORMAL HIGH (ref 70–99)
Glucose-Capillary: 79 mg/dL (ref 70–99)
Glucose-Capillary: 132 mg/dL — ABNORMAL HIGH (ref 70–99)
Glucose-Capillary: 150 mg/dL — ABNORMAL HIGH (ref 70–99)
Glucose-Capillary: 309 mg/dL — ABNORMAL HIGH (ref 70–99)
Glucose-Capillary: 235 mg/dL — ABNORMAL HIGH (ref 70–99)

## 2025-01-11 LAB — CBC
HCT: 32.3 % — ABNORMAL LOW (ref 36.0–46.0)
Hemoglobin: 10.8 g/dL — ABNORMAL LOW (ref 12.0–15.0)
MCH: 30.9 pg (ref 26.0–34.0)
MCHC: 33.4 g/dL (ref 30.0–36.0)
MCV: 92.3 fL (ref 80.0–100.0)
Platelets: 92 10*3/uL — ABNORMAL LOW (ref 150–400)
RBC: 3.5 MIL/uL — ABNORMAL LOW (ref 3.87–5.11)
RDW: 14.7 % (ref 11.5–15.5)
WBC: 9.2 10*3/uL (ref 4.0–10.5)
nRBC: 0 % (ref 0.0–0.2)

## 2025-01-11 LAB — HEPARIN LEVEL (UNFRACTIONATED)
Heparin Unfractionated: <0.1 [IU]/mL — ABNORMAL LOW (ref 0.30–0.70)
Heparin Unfractionated: 0.21 [IU]/mL — ABNORMAL LOW (ref 0.30–0.70)

## 2025-01-11 MED ORDER — HEPARIN (PORCINE) 25000 UT/250ML-% IV SOLN
1600.0000 [IU]/h | INTRAVENOUS | Status: DC
  Administered 2025-01-11: 1250 [IU]/h via INTRAVENOUS
  Administered 2025-01-12: 1400 [IU]/h via INTRAVENOUS
  Filled 2025-01-11 (×2): qty 250

## 2025-01-11 MED ORDER — INSULIN ASPART 100 UNIT/ML IJ SOLN
0.0000 [IU] | Freq: Three times a day (TID) | INTRAMUSCULAR | Status: DC
  Administered 2025-01-11: 5 [IU] via SUBCUTANEOUS
  Administered 2025-01-11 – 2025-01-12 (×4): 2 [IU] via SUBCUTANEOUS
  Administered 2025-01-13: 3 [IU] via SUBCUTANEOUS
  Administered 2025-01-13: 2 [IU] via SUBCUTANEOUS
  Filled 2025-01-11 (×2): qty 5
  Filled 2025-01-11 (×2): qty 2
  Filled 2025-01-11: qty 3
  Filled 2025-01-11 (×2): qty 2

## 2025-01-11 NOTE — Plan of Care (Signed)
  Problem: Coping: Goal: Ability to adjust to condition or change in health will improve Outcome: Progressing   Problem: Education: Goal: Knowledge of General Education information will improve Description: Including pain rating scale, medication(s)/side effects and non-pharmacologic comfort measures Outcome: Progressing   

## 2025-01-11 NOTE — Progress Notes (Signed)
 PHARMACY - ANTICOAGULATION CONSULT NOTE  Pharmacy Consult for IV heparin  Indication: atrial fibrillation  Allergies[1]  Patient Measurements: Height: 5' 8 (172.7 cm) Weight: 95.9 kg (211 lb 6.7 oz) IBW/kg (Calculated) : 63.9 HEPARIN  DW (KG): 85.6  Vital Signs: Temp: 97.9 F (36.6 C) (02/01 0349) Temp Source: Oral (02/01 0349) BP: 111/54 (02/01 0349) Pulse Rate: 70 (02/01 0349)  Labs: Recent Labs    01/08/25 1425 01/08/25 1957 01/09/25 0544 01/09/25 0819 01/09/25 1647 01/09/25 2100 01/10/25 0436 01/10/25 1021 01/10/25 1610 01/11/25 0150  HGB 12.4  --   --  11.3*  --   --  10.4*  --   --  10.8*  HCT 36.7  --   --  32.4*  --   --  30.0*  --   --  32.3*  PLT 185  --   --  147*  --   --  108*  --   --  92*  APTT  --    < > 154* >200*   < > 64* 191* >200* 69*  --   LABPROT 30.8*  --   --   --   --   --   --   --   --   --   INR 2.8*  --   --   --   --   --   --   --   --   --   HEPARINUNFRC  --    < > 0.33 0.47  --   --  0.32  --   --  <0.10*  CREATININE 5.82*   < > 3.96*  --   --  2.55* 2.07*  --   --   --   CKTOTAL 28*  --   --   --   --   --   --   --   --   --    < > = values in this interval not displayed.    Estimated Creatinine Clearance: 28.4 mL/min (A) (by C-G formula based on SCr of 2.07 mg/dL (H)).   Assessment: 74yo F presenting with hypotension. Pt on dabigatran  PTA (LD unknown). Pharmacy consulted for heparin . Baseline CBC stable. **Dabigatran  does not require the use of aPTT monitoring with heparin  because it has no  effect on the anti-Xa assay.**  - Appears heparin  infusion was stopped yesterday (01/10/25) at 1021 per MD due to aPTT >200. Per RN documentation, no signs of bleeding and vitals were stable at that time.  - Today, heparin  level is subtherapeutic <0.10 due to infusion being stopped. After discussing with MD that the dabigatran  could potentially cause falsely elevate aPTT and explaining that we will be using heparin  levels to adjust heparin   infusion rate, will resume heparin  infusion at previous rate without a bolus for now.  - CBC appears numerically stable (hemoglobin 108, platelets 92) >> monitor platelet trend closely given ~50% drop in platelets, but calculated 4T score is 2, which represents low probability of HIT (<5%)    Goal of Therapy:  Heparin  level 0.3-0.7 units/ml Monitor platelets by anticoagulation protocol: Yes   Plan:  Resume heparin  infusion at 1250 units/hr Check heparin  level in 8 hours and daily while on heparin  Continue to monitor H&H and platelets  Feliciano Close, PharmD PGY2 Infectious Diseases Pharmacy Resident      [1]  Allergies Allergen Reactions   Amoxicillin Anaphylaxis, Shortness Of Breath and Swelling    Has patient had a PCN reaction causing immediate rash, facial/tongue/throat swelling, SOB  or lightheadedness with hypotension: Yes Has patient had a PCN reaction causing severe rash involving mucus membranes or skin necrosis: No Has patient had a PCN reaction that required hospitalization No Has patient had a PCN reaction occurring within the last 10 years: Yes   Ampicillin    Codeine Itching    Tolerates hydrocodone    Ezetimibe    Livalo [Pitavastatin]    Statins Other (See Comments)    Muscle skeletal side effects

## 2025-01-11 NOTE — TOC CM/SW Note (Signed)
 Transition of Care [facility]) - Inpatient Brief Assessment   Patient Details  Name: Ann [name] MRN: [id number] Date of Birth: [birth year]  Transition of Care [facility]) CM/SW Contact:    Ann [name], LCSWA Phone Number: 01/11/2025, 11:56 AM   Clinical Narrative: Patient reports she is from home alone but has family support. Patient has PCP and insurance on file. Patient reported [facility]. Patient stated she has the following DME: walker, cane. Patient reports no prior SNF hx, but has HH hx with [facility]. [name] is unsure of her transportation needs for when she is medically stable to dc.   ICM will continue to follow.    Transition of Care Asessment: Insurance and Status: Insurance coverage has been reviewed Patient has primary care physician: Yes Home environment has been reviewed: Home alone Prior level of function:: Independent Prior/Current Home Services: No current home services Social Drivers of Health Review: SDOH reviewed no interventions necessary Readmission risk has been reviewed: Yes Transition of care needs: transition of care needs identified, TOC will continue to follow

## 2025-01-11 NOTE — Evaluation (Signed)
 Physical Therapy Evaluation Patient Details Name: Ann [name] MRN: [id number] DOB: Jun 27, 1951 Today's Date: 01/11/2025  History of Present Illness  Patient is 74 yo female admitted on 01/08/25 for . PMH significant for hx colon CA with partial colectomy , hypokalemia, hyonatremia, expressive aphasia, CVA 08/24/23, cognitive impairments, DM2, anxiety, sleep disturbance, atrial fib, HTN, OA, mitral valve disease.  Clinical Impression  PTA, patient lives alone in two level home with 3 STE, stays on main level. Ambulates with rollator however reports she has been having increased difficulty completing household tasks and ADLs secondary to fatigue and SOB. She has experienced 3 recent falls, having to call EMS for assistance. Patient presents today with generalized weakness, impaired activity tolerance, and impaired functional mobility. Patient's BP EOB 174/94 mmHg, RN notified. Patient also limited in tolerance at EOB secondary to dizziness, not improving. Patient returned to supine. Patient will continue to benefit from skilled acute PT services. Recommend post-acute rehab < 3 hours at this time.       If plan is discharge home, recommend the following: A lot of help with bathing/dressing/bathroom;A lot of help with walking and/or transfers;Assistance with cooking/housework;Help with stairs or ramp for entrance   Can travel by private vehicle   No    Equipment Recommendations Other (comment) (to be determined)  Recommendations for Other Services       Functional Status Assessment Patient has had a recent decline in their functional status and demonstrates the ability to make significant improvements in function in a reasonable and predictable amount of time.     Precautions / Restrictions Precautions Precautions: Fall Recall of Precautions/Restrictions: Intact Restrictions Weight Bearing Restrictions Per Provider Order: No      Mobility  Bed Mobility Overal bed mobility: Needs  Assistance Bed Mobility: Supine to Sit, Sit to Supine     Supine to sit: Min assist, Used rails, HOB elevated Sit to supine: Min assist   General bed mobility comments: minA for trunk righting; assist for LE when returning to bed    Transfers                   General transfer comment: unable, dizzy and feeling unwell    Ambulation/Gait                  Stairs            Wheelchair Mobility     Tilt Bed    Modified Rankin (Stroke Patients Only)       Balance Overall balance assessment: Needs assistance Sitting-balance support: Bilateral upper extremity supported, Feet supported Sitting balance-Leahy Scale: Good Sitting balance - Comments: seated EOB for 7 minutes with SBA                                     Pertinent Vitals/Pain Pain Assessment Pain Assessment: No/denies pain    Home Living Family/patient expects to be discharged to:: Private residence Living Arrangements: Alone Available Help at Discharge: (P) Available PRN/intermittently (reports sister can assist but works mon-thurs. Lives in [place].) Type of Home: House Home Access: Stairs to enter Entrance Stairs-Rails: Right Entrance Stairs-Number of Steps: 3   Home Layout: Two level;Able to live on main level with bedroom/bathroom Home Equipment: Cane - single point;Rollator (4 wheels);Grab bars - tub/shower;Shower seat - built in      Prior Function Prior Level of Function : Independent/Modified Independent;Driving;History of Falls (last six months)  Mobility Comments: Patient ambulates with rollator inside home and [facility] for community distances. Reports 3 falls recently and each one having to call for EMS. ADLs Comments: reports mod I, drives, cooks, etc. Reports poor tolerance for cooking and showering.     Extremity/Trunk Assessment        Lower Extremity Assessment Lower Extremity Assessment: Generalized weakness (R LE deficits > LLE (hx of  stroke))    Cervical / Trunk Assessment Cervical / Trunk Assessment: Normal  Communication   Communication Communication: No apparent difficulties    Cognition Arousal: Alert Behavior During Therapy: Flat affect   PT - Cognitive impairments: No apparent impairments                         Following commands: Intact       Cueing Cueing Techniques: Verbal cues     General Comments      Exercises     Assessment/Plan    PT Assessment Patient needs continued PT services  PT Problem List Decreased strength;Decreased knowledge of use of DME;Decreased activity tolerance;Decreased safety awareness;Decreased balance;Decreased knowledge of precautions;Decreased mobility       PT Treatment Interventions DME instruction;Gait training;Functional mobility training;Stair training;Therapeutic activities;Therapeutic exercise;Balance training;Patient/family education;Neuromuscular re-education    PT Goals (Current goals can be found in the Care Plan section)  Acute Rehab PT Goals Patient Stated Goal: Patient's ultimate goal is to improve strength and return home. PT Goal Formulation: With patient Time For Goal Achievement: 01/25/25 Potential to Achieve Goals: Good    Frequency Min 2X/week     Co-evaluation               AM-PAC PT 6 Clicks Mobility  Outcome Measure Help needed turning from your back to your side while in a flat bed without using bedrails?: A Little Help needed moving from lying on your back to sitting on the side of a flat bed without using bedrails?: A Little Help needed moving to and from a bed to a chair (including a wheelchair)?: Total Help needed standing up from a chair using your arms (e.g., wheelchair or bedside chair)?: Total Help needed to walk in hospital room?: Total Help needed climbing 3-5 steps with a railing? : Total 6 Click Score: 10    End of Session Equipment Utilized During Treatment: Gait belt Activity Tolerance:  Patient limited by fatigue;Other (comment) (dizziness at EOB) Patient left: in bed;with call bell/phone within reach;with bed alarm set Nurse Communication: Mobility status;Precautions (hypertension) PT Visit Diagnosis: Unsteadiness on feet (R26.81);Difficulty in walking, not elsewhere classified (R26.2);Repeated falls (R29.6);Other abnormalities of gait and mobility (R26.89);Muscle weakness (generalized) (M62.81);History of falling (Z91.81)    Time: 8357-8284 PT Time Calculation (min) (ACUTE ONLY): 33 min   Charges:   PT Evaluation $PT Eval Low Complexity: 1 Low PT Treatments $Therapeutic Activity: 8-22 mins PT General Charges $$ ACUTE PT VISIT: 1 Visit       Sherryle Whittingham, PT, DPT [facility] Acute Rehabilitation Office: [phone]   Sherryle VEAR [place] 01/11/2025, 5:30 PM

## 2025-01-11 NOTE — Progress Notes (Signed)
 PHARMACY - ANTICOAGULATION CONSULT NOTE  Pharmacy Consult for IV heparin  Indication: atrial fibrillation  Allergies[1]  Patient Measurements: Height: 5' 8 (172.7 cm) Weight: 95.9 kg (211 lb 6.7 oz) IBW/kg (Calculated) : 63.9 HEPARIN  DW (KG): 85.6  Vital Signs: Temp: 97.2 F (36.2 C) (02/01 1926) Temp Source: Oral (02/01 1926) BP: 109/80 (02/01 1926) Pulse Rate: 79 (02/01 1926)  Labs: Recent Labs    01/09/25 0544 01/09/25 0544 01/09/25 0819 01/09/25 1647 01/09/25 2100 01/10/25 0436 01/10/25 1021 01/10/25 1610 01/11/25 0150 01/11/25 1840  HGB  --    < > 11.3*  --   --  10.4*  --   --  10.8*  --   HCT  --   --  32.4*  --   --  30.0*  --   --  32.3*  --   PLT  --   --  147*  --   --  108*  --   --  92*  --   APTT 154*  --  >200*   < > 64* 191* >200* 69*  --   --   HEPARINUNFRC 0.33  --  0.47  --   --  0.32  --   --  <0.10* 0.21*  CREATININE 3.96*  --   --   --  2.55* 2.07*  --   --   --   --    < > = values in this interval not displayed.    Estimated Creatinine Clearance: 28.4 mL/min (A) (by C-G formula based on SCr of 2.07 mg/dL (H)).   Assessment: 74yo F presenting with hypotension. Pt on dabigatran  PTA (LD unknown). Pharmacy consulted for heparin . Baseline CBC stable. **Dabigatran  does not require the use of aPTT monitoring with heparin  because it has no  effect on the anti-Xa assay.**  - Appears heparin  infusion was stopped yesterday (01/10/25) at 1021 per MD due to aPTT >200. Per RN documentation, no signs of bleeding and vitals were stable at that time.  - Today, heparin  level is subtherapeutic <0.10 due to infusion being stopped. After discussing with MD that the dabigatran  could potentially cause falsely elevate aPTT and explaining that we will be using heparin  levels to adjust heparin  infusion rate, will resume heparin  infusion at previous rate without a bolus for now.  - CBC appears numerically stable (hemoglobin 108, platelets 92) >> monitor platelet trend  closely given ~50% drop in platelets, but calculated 4T score is 2, which represents low probability of HIT (<5%)   PM: heparin  level 0.21 is subtherapeutic on 1250 units/hr. No issues with the infusion or bleeding reported.    Goal of Therapy:  Heparin  level 0.3-0.7 units/ml Monitor platelets by anticoagulation protocol: Yes   Plan:  Increase heparin  infusion to 1400 units/hr Check heparin  level in 8 hours and daily while on heparin  Continue to monitor H&H and platelets  Rocky Slade, PharmD, BCPS 01/11/2025 7:58 PM  Please check AMION for all [facility] Pharmacy phone numbers After 10:00 PM, call Main Pharmacy [phone]     [1]  Allergies Allergen Reactions   Amoxicillin Anaphylaxis, Shortness Of Breath and Swelling    Has patient had a PCN reaction causing immediate rash, facial/tongue/throat swelling, SOB or lightheadedness with hypotension: Yes Has patient had a PCN reaction causing severe rash involving mucus membranes or skin necrosis: No Has patient had a PCN reaction that required hospitalization No Has patient had a PCN reaction occurring within the last 10 years: Yes   Ampicillin    Codeine Itching  Tolerates hydrocodone    Ezetimibe    Livalo [Pitavastatin]    Statins Other (See Comments)    Muscle skeletal side effects

## 2025-01-12 DIAGNOSIS — R579 Shock, unspecified: Secondary | ICD-10-CM | POA: Diagnosis not present

## 2025-01-12 LAB — CBC
HCT: 30.7 % — ABNORMAL LOW (ref 36.0–46.0)
Hemoglobin: 10.4 g/dL — ABNORMAL LOW (ref 12.0–15.0)
MCH: 31 pg (ref 26.0–34.0)
MCHC: 33.9 g/dL (ref 30.0–36.0)
MCV: 91.6 fL (ref 80.0–100.0)
Platelets: 96 10*3/uL — ABNORMAL LOW (ref 150–400)
RBC: 3.35 MIL/uL — ABNORMAL LOW (ref 3.87–5.11)
RDW: 14.6 % (ref 11.5–15.5)
WBC: 8.4 10*3/uL (ref 4.0–10.5)
nRBC: 0 % (ref 0.0–0.2)

## 2025-01-12 LAB — BASIC METABOLIC PANEL WITH GFR
Anion gap: 8 (ref 5–15)
BUN: 33 mg/dL — ABNORMAL HIGH (ref 8–23)
CO2: 25 mmol/L (ref 22–32)
Calcium: 8 mg/dL — ABNORMAL LOW (ref 8.9–10.3)
Chloride: 105 mmol/L (ref 98–111)
Creatinine, Ser: 0.99 mg/dL (ref 0.44–1.00)
GFR, Estimated: 59 mL/min — ABNORMAL LOW
Glucose, Bld: 140 mg/dL — ABNORMAL HIGH (ref 70–99)
Potassium: 3.3 mmol/L — ABNORMAL LOW (ref 3.5–5.1)
Sodium: 138 mmol/L (ref 135–145)

## 2025-01-12 LAB — GLUCOSE, CAPILLARY
Glucose-Capillary: 135 mg/dL — ABNORMAL HIGH (ref 70–99)
Glucose-Capillary: 128 mg/dL — ABNORMAL HIGH (ref 70–99)
Glucose-Capillary: 146 mg/dL — ABNORMAL HIGH (ref 70–99)
Glucose-Capillary: 120 mg/dL — ABNORMAL HIGH (ref 70–99)

## 2025-01-12 LAB — HEPARIN LEVEL (UNFRACTIONATED): Heparin Unfractionated: 0.26 [IU]/mL — ABNORMAL LOW (ref 0.30–0.70)

## 2025-01-12 LAB — MAGNESIUM: Magnesium: 1.4 mg/dL — ABNORMAL LOW (ref 1.7–2.4)

## 2025-01-12 MED ORDER — MAGNESIUM SULFATE 4 GM/100ML IV SOLN
4.0000 g | Freq: Once | INTRAVENOUS | Status: AC
  Administered 2025-01-12: 4 g via INTRAVENOUS
  Filled 2025-01-12: qty 100

## 2025-01-12 MED ORDER — DABIGATRAN ETEXILATE MESYLATE 150 MG PO CAPS
150.0000 mg | ORAL_CAPSULE | Freq: Two times a day (BID) | ORAL | Status: DC
  Administered 2025-01-12 – 2025-01-13 (×3): 150 mg via ORAL
  Filled 2025-01-12 (×5): qty 1

## 2025-01-12 MED ORDER — ZOLPIDEM TARTRATE 5 MG PO TABS
5.0000 mg | ORAL_TABLET | Freq: Every day | ORAL | Status: DC
  Administered 2025-01-12: 5 mg via ORAL
  Filled 2025-01-12: qty 1

## 2025-01-12 MED ORDER — POTASSIUM CHLORIDE CRYS ER 20 MEQ PO TBCR
40.0000 meq | EXTENDED_RELEASE_TABLET | Freq: Once | ORAL | Status: AC
  Administered 2025-01-12: 40 meq via ORAL
  Filled 2025-01-12: qty 2

## 2025-01-12 NOTE — Progress Notes (Signed)
 PHARMACY - ANTICOAGULATION CONSULT NOTE  Pharmacy Consult for IV heparin  Indication: atrial fibrillation  Allergies[1]  Patient Measurements: Height: 5' 8 (172.7 cm) Weight: 97.6 kg (215 lb 2.7 oz) IBW/kg (Calculated) : 63.9 HEPARIN  DW (KG): 85.6  Vital Signs: Temp: 97.7 F (36.5 C) (02/02 0256) Temp Source: Oral (02/02 0256) BP: 113/78 (02/02 0256) Pulse Rate: 79 (02/02 0256)  Labs: Recent Labs    01/09/25 2100 01/10/25 0436 01/10/25 1021 01/10/25 1610 01/11/25 0150 01/11/25 1840 01/12/25 0514  HGB  --  10.4*  --   --  10.8*  --  10.4*  HCT  --  30.0*  --   --  32.3*  --  30.7*  PLT  --  108*  --   --  92*  --  96*  APTT 64* 191* >200* 69*  --   --   --   HEPARINUNFRC  --  0.32  --   --  <0.10* 0.21* 0.26*  CREATININE 2.55* 2.07*  --   --   --   --   --     Estimated Creatinine Clearance: 28.7 mL/min (A) (by C-G formula based on SCr of 2.07 mg/dL (H)).   Assessment: 74yo F presenting with hypotension. Pt on dabigatran  PTA (LD unknown). Pharmacy consulted for heparin . Baseline CBC stable. **Dabigatran  does not require the use of aPTT monitoring with heparin  because it has no  effect on the anti-Xa assay.**  AM: heparin  level 0.26 is subtherapeutic on 1400 units/hr. No issues with the infusion or bleeding reported. CBC stable.  Goal of Therapy:  Heparin  level 0.3-0.7 units/ml Monitor platelets by anticoagulation protocol: Yes   Plan:  Increase heparin  infusion to 1600 units/hr Check heparin  level in 8 hours and daily while on heparin  Continue to monitor H&H and platelets  Rutha Poplar, PharmD, BCPS Clinical Pharmacist 01/12/2025 5:46 AM        [1]  Allergies Allergen Reactions   Amoxicillin Anaphylaxis, Shortness Of Breath and Swelling    Has patient had a PCN reaction causing immediate rash, facial/tongue/throat swelling, SOB or lightheadedness with hypotension: Yes Has patient had a PCN reaction causing severe rash involving mucus membranes or skin  necrosis: No Has patient had a PCN reaction that required hospitalization No Has patient had a PCN reaction occurring within the last 10 years: Yes   Ampicillin    Codeine Itching    Tolerates hydrocodone    Ezetimibe    Livalo [Pitavastatin]    Statins Other (See Comments)    Muscle skeletal side effects

## 2025-01-12 NOTE — Plan of Care (Signed)
  Problem: Coping: Goal: Ability to adjust to condition or change in health will improve Outcome: Progressing   Problem: Education: Goal: Knowledge of General Education information will improve Description: Including pain rating scale, medication(s)/side effects and non-pharmacologic comfort measures Outcome: Progressing   

## 2025-01-13 DIAGNOSIS — R579 Shock, unspecified: Secondary | ICD-10-CM | POA: Diagnosis not present

## 2025-01-13 LAB — CULTURE, BLOOD (ROUTINE X 2)
Culture: NO GROWTH
Culture: NO GROWTH
Special Requests: ADEQUATE

## 2025-01-13 LAB — CBC
HCT: 31.5 % — ABNORMAL LOW (ref 36.0–46.0)
Hemoglobin: 10.5 g/dL — ABNORMAL LOW (ref 12.0–15.0)
MCH: 31.1 pg (ref 26.0–34.0)
MCHC: 33.3 g/dL (ref 30.0–36.0)
MCV: 93.2 fL (ref 80.0–100.0)
Platelets: 101 10*3/uL — ABNORMAL LOW (ref 150–400)
RBC: 3.38 MIL/uL — ABNORMAL LOW (ref 3.87–5.11)
RDW: 14.5 % (ref 11.5–15.5)
WBC: 7.4 10*3/uL (ref 4.0–10.5)
nRBC: 0 % (ref 0.0–0.2)

## 2025-01-13 LAB — BASIC METABOLIC PANEL WITH GFR
Anion gap: 9 (ref 5–15)
BUN: 22 mg/dL (ref 8–23)
CO2: 26 mmol/L (ref 22–32)
Calcium: 8.1 mg/dL — ABNORMAL LOW (ref 8.9–10.3)
Chloride: 103 mmol/L (ref 98–111)
Creatinine, Ser: 0.82 mg/dL (ref 0.44–1.00)
GFR, Estimated: >60 mL/min
Glucose, Bld: 136 mg/dL — ABNORMAL HIGH (ref 70–99)
Potassium: 3.6 mmol/L (ref 3.5–5.1)
Sodium: 138 mmol/L (ref 135–145)

## 2025-01-13 LAB — GLUCOSE, CAPILLARY
Glucose-Capillary: 144 mg/dL — ABNORMAL HIGH (ref 70–99)
Glucose-Capillary: 166 mg/dL — ABNORMAL HIGH (ref 70–99)

## 2025-01-13 LAB — MAGNESIUM: Magnesium: 1.7 mg/dL (ref 1.7–2.4)

## 2025-01-13 MED ORDER — ADULT MULTIVITAMIN W/MINERALS CH
1.0000 | ORAL_TABLET | Freq: Every day | ORAL | Status: DC
  Administered 2025-01-13: 1 via ORAL
  Filled 2025-01-13: qty 1

## 2025-01-13 MED ORDER — ZOLPIDEM TARTRATE 5 MG PO TABS: 5.0000 mg | ORAL_TABLET | Freq: Every day | ORAL | 0 refills | Status: AC

## 2025-01-13 MED ORDER — BASAGLAR KWIKPEN 100 UNIT/ML ~~LOC~~ SOPN: 7.0000 [IU] | PEN_INJECTOR | SUBCUTANEOUS | Status: AC

## 2025-01-13 MED ORDER — ADULT MULTIVITAMIN W/MINERALS CH: 1.0000 | ORAL_TABLET | Freq: Every day | ORAL | Status: AC

## 2025-01-13 NOTE — Progress Notes (Signed)
 AVS printed to floor  Charge nurse to put into packet for transport

## 2025-01-13 NOTE — Progress Notes (Signed)
 Physical Therapy Treatment Patient Details Name: Ann [name] MRN: [id number] DOB: [birth year] Today's Date: 01/13/2025   History of Present Illness Pt is a 74 y.o. female presenting 1/29 with near syncope, weakness, dizziness. Found to be hypotensive with hypovolemic shock, AKI, lactic acidosis, persistent afib, hypokalemia, hypomagnesemia. Ann [name] PMH significant for hx colon CA with partial colectomy , hypokalemia, hyonatremia, expressive aphasia, CVA 08/24/23, cognitive impairments, DM2, anxiety, sleep disturbance, atrial fib, HTN, OA, mitral valve disease.    PT Comments  Pt tolerates treatment well despite hypertension and tachycardia when mobilizing. Pt progresses to ambulating for household distances with support of a RW, reports dizziness near completion of ambulation, associated with tachycardia. BP in sitting immediately after ambulation is 180/93. Pt continues to require physical assistance to perform bed mobility and transfers. Due to lack of caregiver support PT continues to recommend short term inpatient PT services. Patient will benefit from continued inpatient follow up therapy, <3 hours/day.   If plan is discharge home, recommend the following: A lot of help with bathing/dressing/bathroom;A lot of help with walking and/or transfers;Assistance with cooking/housework;Help with stairs or ramp for entrance   Can travel by private vehicle     No  Equipment Recommendations  BSC/3in1    Recommendations for Other Services       Precautions / Restrictions Precautions Precautions: Fall Recall of Precautions/Restrictions: Intact Restrictions Weight Bearing Restrictions Per Provider Order: No     Mobility  Bed Mobility Overal bed mobility: Needs Assistance Bed Mobility: Supine to Sit     Supine to sit: Min assist, HOB elevated          Transfers Overall transfer level: Needs assistance Equipment used: Rolling walker (2 wheels) Transfers: Sit to/from Stand, Bed to  chair/wheelchair/BSC Sit to Stand: Min assist   Step pivot transfers: Contact guard assist            Ambulation/Gait Ambulation/Gait assistance: Contact guard assist Gait Distance (Feet): 120 Feet Assistive device: Rolling walker (2 wheels) Gait Pattern/deviations: Step-to pattern Gait velocity: reduced Gait velocity interpretation: <1.8 ft/sec, indicate of risk for recurrent falls   General Gait Details: slowed step-to gait, increased time   Stairs             Wheelchair Mobility     Tilt Bed    Modified Rankin (Stroke Patients Only)       Balance Overall balance assessment: Needs assistance Sitting-balance support: No upper extremity supported, Feet supported Sitting balance-Leahy Scale: Fair     Standing balance support: Bilateral upper extremity supported, Reliant on assistive device for balance Standing balance-Leahy Scale: Poor                              Communication Communication Communication: Impaired Factors Affecting Communication: Difficulty expressing self (some word finding deficits noted)  Cognition Arousal: Alert Behavior During Therapy: WFL for tasks assessed/performed   PT - Cognitive impairments: Problem solving                         Following commands: Intact      Cueing Cueing Techniques: Verbal cues  Exercises      General Comments General comments (skin integrity, edema, etc.): pt with hypertension and tachycardia when mobilizing, BP 181/91 after transfer to recliner and 180/93 after ambulation. Pt is intermittently tachycardic up to 157 observed by PT with ambulation. Pt reports dizziness near completion of ambulation bout  Pertinent Vitals/Pain Pain Assessment Pain Assessment: No/denies pain    Home Living                          Prior Function            PT Goals (current goals can now be found in the care plan section) Acute Rehab PT Goals Patient Stated Goal:  Patient's ultimate goal is to improve strength and return home. Progress towards PT goals: Progressing toward goals    Frequency    Min 2X/week      PT Plan      Co-evaluation              AM-PAC PT 6 Clicks Mobility   Outcome Measure  Help needed turning from your back to your side while in a flat bed without using bedrails?: A Little Help needed moving from lying on your back to sitting on the side of a flat bed without using bedrails?: A Little Help needed moving to and from a bed to a chair (including a wheelchair)?: A Little Help needed standing up from a chair using your arms (e.g., wheelchair or bedside chair)?: A Little Help needed to walk in hospital room?: A Little Help needed climbing 3-5 steps with a railing? : Total 6 Click Score: 16    End of Session Equipment Utilized During Treatment: Gait belt Activity Tolerance: Patient tolerated treatment well Patient left: in chair;with call bell/phone within reach;with chair alarm set Nurse Communication: Mobility status PT Visit Diagnosis: Unsteadiness on feet (R26.81);Difficulty in walking, not elsewhere classified (R26.2);Repeated falls (R29.6);Other abnormalities of gait and mobility (R26.89);Muscle weakness (generalized) (M62.81);History of falling (Z91.81)     Time: 1200-1230 PT Time Calculation (min) (ACUTE ONLY): 30 min  Charges:    $Gait Training: 8-22 mins $Therapeutic Activity: 8-22 mins PT General Charges $$ ACUTE PT VISIT: 1 Visit                     Ann [name], PT, DPT Acute Rehabilitation Office [phone]    Ann [name] 01/13/2025, 1:00 PM

## 2025-01-15 ENCOUNTER — Emergency Department (HOSPITAL_COMMUNITY)

## 2025-01-15 ENCOUNTER — Inpatient Hospital Stay (HOSPITAL_COMMUNITY)
Admission: EM | Admit: 2025-01-15 | Source: Skilled Nursing Facility | Attending: Family Medicine | Admitting: Family Medicine

## 2025-01-15 ENCOUNTER — Other Ambulatory Visit: Payer: Self-pay

## 2025-01-15 ENCOUNTER — Encounter (HOSPITAL_COMMUNITY): Payer: Self-pay | Admitting: Family Medicine

## 2025-01-15 DIAGNOSIS — R079 Chest pain, unspecified: Secondary | ICD-10-CM | POA: Diagnosis present

## 2025-01-15 DIAGNOSIS — R509 Fever, unspecified: Secondary | ICD-10-CM

## 2025-01-15 DIAGNOSIS — Z794 Long term (current) use of insulin: Secondary | ICD-10-CM

## 2025-01-15 DIAGNOSIS — I4821 Permanent atrial fibrillation: Secondary | ICD-10-CM | POA: Diagnosis not present

## 2025-01-15 DIAGNOSIS — E039 Hypothyroidism, unspecified: Secondary | ICD-10-CM | POA: Diagnosis present

## 2025-01-15 DIAGNOSIS — I5033 Acute on chronic diastolic (congestive) heart failure: Secondary | ICD-10-CM | POA: Diagnosis not present

## 2025-01-15 DIAGNOSIS — R071 Chest pain on breathing: Secondary | ICD-10-CM

## 2025-01-15 DIAGNOSIS — J9859 Other diseases of mediastinum, not elsewhere classified: Secondary | ICD-10-CM | POA: Diagnosis present

## 2025-01-15 DIAGNOSIS — R4189 Other symptoms and signs involving cognitive functions and awareness: Secondary | ICD-10-CM | POA: Diagnosis not present

## 2025-01-15 DIAGNOSIS — E063 Autoimmune thyroiditis: Secondary | ICD-10-CM | POA: Diagnosis not present

## 2025-01-15 DIAGNOSIS — L02415 Cutaneous abscess of right lower limb: Secondary | ICD-10-CM | POA: Diagnosis not present

## 2025-01-15 DIAGNOSIS — J90 Pleural effusion, not elsewhere classified: Principal | ICD-10-CM

## 2025-01-15 DIAGNOSIS — Z8673 Personal history of transient ischemic attack (TIA), and cerebral infarction without residual deficits: Secondary | ICD-10-CM

## 2025-01-15 DIAGNOSIS — R0602 Shortness of breath: Secondary | ICD-10-CM

## 2025-01-15 DIAGNOSIS — I2510 Atherosclerotic heart disease of native coronary artery without angina pectoris: Secondary | ICD-10-CM | POA: Diagnosis present

## 2025-01-15 DIAGNOSIS — D696 Thrombocytopenia, unspecified: Secondary | ICD-10-CM | POA: Diagnosis present

## 2025-01-15 DIAGNOSIS — E1165 Type 2 diabetes mellitus with hyperglycemia: Secondary | ICD-10-CM

## 2025-01-15 DIAGNOSIS — I4891 Unspecified atrial fibrillation: Secondary | ICD-10-CM | POA: Diagnosis present

## 2025-01-15 LAB — BASIC METABOLIC PANEL WITH GFR
Anion gap: 12 (ref 5–15)
BUN: 9 mg/dL (ref 8–23)
CO2: 26 mmol/L (ref 22–32)
Calcium: 7.7 mg/dL — ABNORMAL LOW (ref 8.9–10.3)
Chloride: 98 mmol/L (ref 98–111)
Creatinine, Ser: 0.86 mg/dL (ref 0.44–1.00)
GFR, Estimated: >60 mL/min
Glucose, Bld: 187 mg/dL — ABNORMAL HIGH (ref 70–99)
Potassium: 3.5 mmol/L (ref 3.5–5.1)
Sodium: 135 mmol/L (ref 135–145)

## 2025-01-15 LAB — RESP PANEL BY RT-PCR (RSV, FLU A&B, COVID)  RVPGX2
Influenza A by PCR: NEGATIVE
Influenza B by PCR: NEGATIVE
Resp Syncytial Virus by PCR: NEGATIVE
SARS Coronavirus 2 by RT PCR: NEGATIVE

## 2025-01-15 LAB — I-STAT CHEM 8, ED
BUN: 9 mg/dL (ref 8–23)
Calcium, Ion: 0.96 mmol/L — ABNORMAL LOW (ref 1.15–1.40)
Chloride: 95 mmol/L — ABNORMAL LOW (ref 98–111)
Creatinine, Ser: 0.8 mg/dL (ref 0.44–1.00)
Glucose, Bld: 170 mg/dL — ABNORMAL HIGH (ref 70–99)
HCT: 35 % — ABNORMAL LOW (ref 36.0–46.0)
Hemoglobin: 11.9 g/dL — ABNORMAL LOW (ref 12.0–15.0)
Potassium: 3.6 mmol/L (ref 3.5–5.1)
Sodium: 137 mmol/L (ref 135–145)
TCO2: 28 mmol/L (ref 22–32)

## 2025-01-15 LAB — CBC
HCT: 33.7 % — ABNORMAL LOW (ref 36.0–46.0)
Hemoglobin: 10.8 g/dL — ABNORMAL LOW (ref 12.0–15.0)
MCH: 30.4 pg (ref 26.0–34.0)
MCHC: 32 g/dL (ref 30.0–36.0)
MCV: 94.9 fL (ref 80.0–100.0)
Platelets: 108 10*3/uL — ABNORMAL LOW (ref 150–400)
RBC: 3.55 MIL/uL — ABNORMAL LOW (ref 3.87–5.11)
RDW: 13.8 % (ref 11.5–15.5)
WBC: 8.5 10*3/uL (ref 4.0–10.5)
nRBC: 0 % (ref 0.0–0.2)

## 2025-01-15 LAB — HEPATIC FUNCTION PANEL
ALT: 7 U/L (ref 0–44)
AST: 17 U/L (ref 15–41)
Albumin: 3 g/dL — ABNORMAL LOW (ref 3.5–5.0)
Alkaline Phosphatase: 114 U/L (ref 38–126)
Bilirubin, Direct: 0.4 mg/dL — ABNORMAL HIGH (ref 0.0–0.2)
Indirect Bilirubin: 0.5 mg/dL (ref 0.3–0.9)
Total Bilirubin: 0.9 mg/dL (ref 0.0–1.2)
Total Protein: 6.1 g/dL — ABNORMAL LOW (ref 6.5–8.1)

## 2025-01-15 LAB — TROPONIN T, HIGH SENSITIVITY
Troponin T High Sensitivity: 32 ng/L — ABNORMAL HIGH (ref 0–19)
Troponin T High Sensitivity: 34 ng/L — ABNORMAL HIGH (ref 0–19)

## 2025-01-15 LAB — PRO BRAIN NATRIURETIC PEPTIDE: Pro Brain Natriuretic Peptide: 2753 pg/mL — ABNORMAL HIGH

## 2025-01-15 LAB — I-STAT CG4 LACTIC ACID, ED: Lactic Acid, Venous: 1.1 mmol/L (ref 0.5–1.9)

## 2025-01-15 LAB — PROTIME-INR
INR: 1.6 — ABNORMAL HIGH (ref 0.8–1.2)
Prothrombin Time: 20.2 s — ABNORMAL HIGH (ref 11.4–15.2)

## 2025-01-15 LAB — D-DIMER, QUANTITATIVE: D-Dimer, Quant: 1.29 ug{FEU}/mL — ABNORMAL HIGH (ref 0.00–0.50)

## 2025-01-15 MED ORDER — OXYCODONE HCL 5 MG PO TABS: 2.5000 mg | ORAL_TABLET | ORAL | Status: DC | PRN

## 2025-01-15 MED ORDER — SODIUM CHLORIDE 0.9 % IV SOLN
2.0000 g | Freq: Once | INTRAVENOUS | Status: AC
  Administered 2025-01-15: 2 g via INTRAVENOUS
  Filled 2025-01-15: qty 20

## 2025-01-15 MED ORDER — SODIUM CHLORIDE 0.9% FLUSH
3.0000 mL | Freq: Two times a day (BID) | INTRAVENOUS | Status: AC
  Administered 2025-01-16 (×2): 3 mL via INTRAVENOUS

## 2025-01-15 MED ORDER — ACETAMINOPHEN 325 MG PO TABS: 650.0000 mg | ORAL_TABLET | Freq: Four times a day (QID) | ORAL | Status: AC | PRN

## 2025-01-15 MED ORDER — SODIUM CHLORIDE 0.9 % IV SOLN: 500.0000 mg | Freq: Once | INTRAVENOUS | Status: DC

## 2025-01-15 MED ORDER — ACETAMINOPHEN 500 MG PO TABS
1000.0000 mg | ORAL_TABLET | Freq: Once | ORAL | Status: AC
  Administered 2025-01-15: 1000 mg via ORAL
  Filled 2025-01-15: qty 2

## 2025-01-15 MED ORDER — LACTATED RINGERS IV BOLUS (SEPSIS): 1000.0000 mL | Freq: Once | INTRAVENOUS | Status: DC

## 2025-01-15 MED ORDER — IOHEXOL 350 MG/ML SOLN
75.0000 mL | Freq: Once | INTRAVENOUS | Status: AC | PRN
  Administered 2025-01-15: 75 mL via INTRAVENOUS

## 2025-01-15 MED ORDER — FUROSEMIDE 10 MG/ML IJ SOLN
40.0000 mg | Freq: Once | INTRAMUSCULAR | Status: AC
  Administered 2025-01-15: 40 mg via INTRAVENOUS
  Filled 2025-01-15: qty 4

## 2025-01-15 MED ORDER — LEVOTHYROXINE SODIUM 50 MCG PO TABS
50.0000 ug | ORAL_TABLET | Freq: Every day | ORAL | Status: AC
  Administered 2025-01-16: 50 ug via ORAL
  Filled 2025-01-15: qty 2

## 2025-01-15 MED ORDER — INSULIN ASPART 100 UNIT/ML IJ SOLN
0.0000 [IU] | INTRAMUSCULAR | Status: AC
  Administered 2025-01-16: 3 [IU] via SUBCUTANEOUS
  Filled 2025-01-15: qty 1

## 2025-01-15 MED ORDER — ONDANSETRON HCL 4 MG PO TABS: 4.0000 mg | ORAL_TABLET | Freq: Four times a day (QID) | ORAL | Status: AC | PRN

## 2025-01-15 MED ORDER — SODIUM CHLORIDE 0.9 % IV SOLN
100.0000 mg | Freq: Once | INTRAVENOUS | Status: AC
  Administered 2025-01-15: 100 mg via INTRAVENOUS
  Filled 2025-01-15: qty 100

## 2025-01-15 MED ORDER — ZOLPIDEM TARTRATE 5 MG PO TABS
5.0000 mg | ORAL_TABLET | Freq: Every day | ORAL | Status: AC
  Administered 2025-01-16 (×2): 5 mg via ORAL
  Filled 2025-01-15 (×2): qty 1

## 2025-01-15 MED ORDER — PANTOPRAZOLE SODIUM 40 MG PO TBEC
40.0000 mg | DELAYED_RELEASE_TABLET | Freq: Every morning | ORAL | Status: AC
  Administered 2025-01-16: 40 mg via ORAL
  Filled 2025-01-15: qty 1

## 2025-01-15 MED ORDER — ALLOPURINOL 100 MG PO TABS
100.0000 mg | ORAL_TABLET | Freq: Every day | ORAL | Status: AC
  Administered 2025-01-16: 100 mg via ORAL
  Filled 2025-01-15: qty 1

## 2025-01-15 MED ORDER — ONDANSETRON HCL 4 MG/2ML IJ SOLN: 4.0000 mg | Freq: Four times a day (QID) | INTRAMUSCULAR | Status: AC | PRN

## 2025-01-15 MED ORDER — SENNOSIDES-DOCUSATE SODIUM 8.6-50 MG PO TABS: 1.0000 | ORAL_TABLET | Freq: Every evening | ORAL | Status: AC | PRN

## 2025-01-15 MED ORDER — VANCOMYCIN HCL 1500 MG/300ML IV SOLN
1500.0000 mg | Freq: Once | INTRAVENOUS | Status: AC
  Administered 2025-01-16: 1500 mg via INTRAVENOUS
  Filled 2025-01-15: qty 300

## 2025-01-15 MED ORDER — ACETAMINOPHEN 650 MG RE SUPP: 650.0000 mg | Freq: Four times a day (QID) | RECTAL | Status: AC | PRN

## 2025-01-15 MED ORDER — LACTATED RINGERS IV SOLN: INTRAVENOUS | Status: DC

## 2025-01-15 MED ORDER — FUROSEMIDE 10 MG/ML IJ SOLN
40.0000 mg | Freq: Two times a day (BID) | INTRAMUSCULAR | Status: DC
  Administered 2025-01-16: 40 mg via INTRAVENOUS
  Filled 2025-01-15: qty 4

## 2025-01-15 NOTE — ED Provider Notes (Signed)
 " [place] [facility] Provider Note   CSN: [id number] Arrival date & time: 01/15/25  8042     Patient presents with: Chest Pain   KINA SHIFFMAN is a 74 y.o. female with a past medical history notable for atrial fibrillation on dabigatran , colon cancer status post resection and radiation, chronic diarrhea, hypothyroidism, type 2 diabetes, prior CVA who presents today for evaluation of chest pain.  Patient reports that approximately an hour and a half prior to arrival she developed sudden onset left-sided chest pain with radiation to the left arm.  Denies any associated cough, fevers, chills, lightheadedness or dizziness.  Has never had pain like this before.  On my chart review patient was just discharged 2 days ago for evaluation of hypovolemic shock, AKI as well as lactic acidosis.   Chest Pain      Prior to Admission medications  Medication Sig Start Date End Date Taking? Authorizing Provider  allopurinol  (ZYLOPRIM ) 100 MG tablet Take 1 tablet (100 mg total) by mouth daily. 08/05/24   Kuneff, Renee A, DO  colestipol (COLESTID) 1 g tablet Take 1 g by mouth 2 (two) times daily. 12/18/24   [provider]  dabigatran  (PRADAXA ) 150 MG CAPS capsule Take 1 capsule (150 mg total) by mouth 2 (two) times daily. 12/16/24   Ladona Heinz, MD  EPIPEN 2-PAK 0.3 MG/0.3ML SOAJ injection Inject 0.3 mg into the skin as needed. Bee stings 11/11/15   [provider]  Insulin  Glargine (BASAGLAR  KWIKPEN) 100 UNIT/ML Inject 7 Units into the skin every morning. 01/13/25   Mdala-Gausi, Masiku Agatha, MD  levothyroxine  (SYNTHROID ) 50 MCG tablet Take 1 tablet (50 mcg total) by mouth daily before breakfast. 08/05/24   Kuneff, Renee A, DO  [Paused] metoprolol  tartrate (LOPRESSOR ) 50 MG tablet Take 1 tablet (50 mg total) by mouth 2 (two) times daily. Wait to take this until your doctor or other care provider tells you to start again. 11/20/24   Kuneff, Renee A, DO   Multiple Vitamin (MULTIVITAMIN WITH MINERALS) TABS tablet Take 1 tablet by mouth daily. 01/14/25   Mdala-Gausi, Masiku Agatha, MD  [Paused] olmesartan  (BENICAR ) 40 MG tablet Take 1 tablet (40 mg total) by mouth daily. Wait to take this until your doctor or other care provider tells you to start again. 11/21/24   Kuneff, Renee A, DO  pantoprazole  (PROTONIX ) 40 MG tablet Take 40 mg by mouth every morning. 12/17/24   [provider]  zolpidem  (AMBIEN ) 5 MG tablet Take 1 tablet (5 mg total) by mouth at bedtime. 01/13/25   Mdala-Gausi, Golden Pillow, MD    Allergies: Amoxicillin, Ampicillin, Codeine, Ezetimibe, Livalo [pitavastatin], and Statins    Review of Systems  Cardiovascular:  Positive for chest pain.    Updated Vital Signs BP (!) 158/94 (BP Location: Left Arm)   Pulse (!) 104   Temp 99.2 F (37.3 C) (Oral)   Resp (!) 22   Ht 5' 8 (1.727 m)   Wt 90.7 kg   SpO2 94%   BMI 30.41 kg/m   Physical Exam  (all labs ordered are listed, but only abnormal results are displayed) Labs Reviewed  BASIC METABOLIC PANEL WITH GFR  CBC  PROTIME-INR  URINALYSIS, ROUTINE W REFLEX MICROSCOPIC  PRO BRAIN NATRIURETIC PEPTIDE  TROPONIN T, HIGH SENSITIVITY    EKG: EKG Interpretation Date/Time:  Thursday January 15 2025 20:02:08 EST Ventricular Rate:  94 PR Interval:    QRS Duration:  84 QT Interval:  349  QTC Calculation: 437 R Axis:   109  Text Interpretation: Atrial fibrillation Right axis deviation Abnormal T, consider ischemia, diffuse leads Confirmed by Ruthe Cornet [phone]) on 01/15/2025 8:05:19 PM  Radiology: No results found.  Procedures   Medications Ordered in the ED - No data to display                                 Medical Decision Making Amount and/or Complexity of Data Reviewed Labs: ordered. Radiology: ordered.  Risk OTC drugs. Prescription drug management. Decision regarding hospitalization.   Patient is a 74 year old female who presents today for  evaluation of sudden onset left-sided chest pain.  On initial assessment patient was noted to be mildly hypertensive.  Heart rates in the low 100s in A-fib at this point in time.  Let bedside assessment patient was noted to be tired appearing however otherwise without any acute distress.  Physical examination without any acute abnormalities.  Patient does have tenderness that is reproducible with palpation to the left anterior chest with no overlying signs of trauma or obvious rashes.  During patient's time here in the emergency department we rechecked her temperature via rectal thermometer that showed evidence of a fever of 101.    With patient's associated tachycardia, sepsis protocol was initiated and she was provided Rocephin , doxycycline , Tylenol .  Review of patient's laboratory evaluation with unremarkable CBC, metabolic panel without evidence of AKI, normal interval lactic acid.  Patient does have an elevated BNP of 2700 as well as an elevated troponin of 32..  Patient's imaging with evidence of volume overload.,  There was also finding concerning for potential abscess around the right hip with a rim-enhancing fluid collection measuring 4 x 5 cm.   Final diagnoses:  Pleural effusion  Fever, unspecified fever cause  Chest pain, unspecified type  SOB (shortness of breath)    ED Discharge Orders     None          Laurita Sieving, MD 01/15/25 2355    Ruthe Cornet, DO 01/15/25 2356  "

## 2025-01-15 NOTE — ED Notes (Signed)
 Patient transported to CT

## 2025-01-15 NOTE — ED Triage Notes (Signed)
 Patient BIB [facility] from [facility] due chest pain. Patient reports pain x2 hours 8/10. Patient states worse with deep breath. Recently seen for afib per EMS. Received tylenol  at facility, 324 aspirin  and 700 mL LR in route via EMS. VSS.

## 2025-01-15 NOTE — H&P (Incomplete)
 SABRA

## 2025-01-15 NOTE — Progress Notes (Signed)
 Elink monitoring for the code sepsis protocol.

## 2025-01-15 NOTE — ED Notes (Signed)
 Jenkins Rein (sister) would like a update [phone]

## 2025-01-15 NOTE — ED Notes (Signed)
 When you have a chance, Jenkins Ladd (sister) [phone] would like an update on pt. Status. Thank you

## 2025-01-16 ENCOUNTER — Inpatient Hospital Stay (HOSPITAL_COMMUNITY)

## 2025-01-16 ENCOUNTER — Encounter (HOSPITAL_COMMUNITY): Payer: Self-pay | Admitting: Family Medicine

## 2025-01-16 ENCOUNTER — Encounter (HOSPITAL_COMMUNITY): Admission: EM | Payer: Self-pay | Source: Skilled Nursing Facility | Attending: Family Medicine

## 2025-01-16 LAB — URINALYSIS, ROUTINE W REFLEX MICROSCOPIC
Bilirubin Urine: NEGATIVE
Glucose, UA: NEGATIVE mg/dL
Hgb urine dipstick: NEGATIVE
Ketones, ur: NEGATIVE mg/dL
Leukocytes,Ua: NEGATIVE
Nitrite: NEGATIVE
Protein, ur: NEGATIVE mg/dL
Specific Gravity, Urine: 1.013 (ref 1.005–1.030)
pH: 5 (ref 5.0–8.0)

## 2025-01-16 LAB — RENAL FUNCTION PANEL
Albumin: 2.8 g/dL — ABNORMAL LOW (ref 3.5–5.0)
Anion gap: 13 (ref 5–15)
BUN: 11 mg/dL (ref 8–23)
CO2: 25 mmol/L (ref 22–32)
Calcium: 7.4 mg/dL — ABNORMAL LOW (ref 8.9–10.3)
Chloride: 95 mmol/L — ABNORMAL LOW (ref 98–111)
Creatinine, Ser: 0.82 mg/dL (ref 0.44–1.00)
GFR, Estimated: >60 mL/min
Glucose, Bld: 209 mg/dL — ABNORMAL HIGH (ref 70–99)
Phosphorus: 3.1 mg/dL (ref 2.5–4.6)
Potassium: 4.2 mmol/L (ref 3.5–5.1)
Sodium: 133 mmol/L — ABNORMAL LOW (ref 135–145)

## 2025-01-16 LAB — GLUCOSE, CAPILLARY
Glucose-Capillary: 147 mg/dL — ABNORMAL HIGH (ref 70–99)
Glucose-Capillary: 172 mg/dL — ABNORMAL HIGH (ref 70–99)
Glucose-Capillary: 254 mg/dL — ABNORMAL HIGH (ref 70–99)
Glucose-Capillary: 263 mg/dL — ABNORMAL HIGH (ref 70–99)

## 2025-01-16 LAB — CBC
HCT: 34.3 % — ABNORMAL LOW (ref 36.0–46.0)
HCT: 33.1 % — ABNORMAL LOW (ref 36.0–46.0)
Hemoglobin: 10.9 g/dL — ABNORMAL LOW (ref 12.0–15.0)
Hemoglobin: 10.8 g/dL — ABNORMAL LOW (ref 12.0–15.0)
MCH: 30.1 pg (ref 26.0–34.0)
MCH: 30.1 pg (ref 26.0–34.0)
MCHC: 31.8 g/dL (ref 30.0–36.0)
MCHC: 32.6 g/dL (ref 30.0–36.0)
MCV: 94.8 fL (ref 80.0–100.0)
MCV: 92.2 fL (ref 80.0–100.0)
Platelets: 107 10*3/uL — ABNORMAL LOW (ref 150–400)
Platelets: 109 10*3/uL — ABNORMAL LOW (ref 150–400)
RBC: 3.62 MIL/uL — ABNORMAL LOW (ref 3.87–5.11)
RBC: 3.59 MIL/uL — ABNORMAL LOW (ref 3.87–5.11)
RDW: 13.8 % (ref 11.5–15.5)
RDW: 14 % (ref 11.5–15.5)
WBC: 7.6 10*3/uL (ref 4.0–10.5)
WBC: 9 10*3/uL (ref 4.0–10.5)
nRBC: 0 % (ref 0.0–0.2)
nRBC: 0 % (ref 0.0–0.2)

## 2025-01-16 LAB — CULTURE, BLOOD (ROUTINE X 2)
Culture: NO GROWTH
Culture: NO GROWTH

## 2025-01-16 LAB — POCT ACTIVATED CLOTTING TIME
Activated Clotting Time: 353 s
Activated Clotting Time: 286 s
Activated Clotting Time: 209 s

## 2025-01-16 LAB — BASIC METABOLIC PANEL WITH GFR
Anion gap: 9 (ref 5–15)
BUN: 9 mg/dL (ref 8–23)
CO2: 28 mmol/L (ref 22–32)
Calcium: 7.3 mg/dL — ABNORMAL LOW (ref 8.9–10.3)
Chloride: 99 mmol/L (ref 98–111)
Creatinine, Ser: 0.82 mg/dL (ref 0.44–1.00)
GFR, Estimated: >60 mL/min
Glucose, Bld: 130 mg/dL — ABNORMAL HIGH (ref 70–99)
Potassium: 3.1 mmol/L — ABNORMAL LOW (ref 3.5–5.1)
Sodium: 136 mmol/L (ref 135–145)

## 2025-01-16 LAB — TYPE AND SCREEN
ABO/RH(D): O POS
Antibody Screen: NEGATIVE

## 2025-01-16 LAB — MAGNESIUM
Magnesium: 0.9 mg/dL — CL (ref 1.7–2.4)
Magnesium: 1.3 mg/dL — ABNORMAL LOW (ref 1.7–2.4)

## 2025-01-16 LAB — CBG MONITORING, ED
Glucose-Capillary: 121 mg/dL — ABNORMAL HIGH (ref 70–99)
Glucose-Capillary: 138 mg/dL — ABNORMAL HIGH (ref 70–99)
Glucose-Capillary: 139 mg/dL — ABNORMAL HIGH (ref 70–99)

## 2025-01-16 MED ORDER — SODIUM CHLORIDE 0.9 % IV SOLN: INTRAVENOUS | Status: AC

## 2025-01-16 MED ORDER — LABETALOL HCL 5 MG/ML IV SOLN: 10.0000 mg | INTRAVENOUS | Status: AC | PRN

## 2025-01-16 MED ORDER — PROTAMINE SULFATE 10 MG/ML IV SOLN
INTRAVENOUS | Status: AC
  Filled 2025-01-16: qty 10

## 2025-01-16 MED ORDER — ROCURONIUM BROMIDE 10 MG/ML (PF) SYRINGE
PREFILLED_SYRINGE | INTRAVENOUS | Status: AC
  Filled 2025-01-16: qty 10

## 2025-01-16 MED ORDER — HEPARIN 6000 UNIT IRRIGATION SOLUTION
Status: AC
  Filled 2025-01-16: qty 500

## 2025-01-16 MED ORDER — OXYCODONE HCL 5 MG/5ML PO SOLN: 5.0000 mg | Freq: Once | ORAL | Status: DC | PRN

## 2025-01-16 MED ORDER — INSULIN ASPART 100 UNIT/ML IJ SOLN
0.0000 [IU] | INTRAMUSCULAR | Status: DC | PRN
  Filled 2025-01-16: qty 0.07

## 2025-01-16 MED ORDER — OXYCODONE HCL 5 MG PO TABS
2.5000 mg | ORAL_TABLET | ORAL | Status: DC | PRN
  Administered 2025-01-16: 5 mg via ORAL
  Filled 2025-01-16: qty 1

## 2025-01-16 MED ORDER — LIDOCAINE 2% (20 MG/ML) 5 ML SYRINGE
INTRAMUSCULAR | Status: AC
  Filled 2025-01-16: qty 5

## 2025-01-16 MED ORDER — ONDANSETRON HCL 4 MG/2ML IJ SOLN
INTRAMUSCULAR | Status: AC
  Filled 2025-01-16: qty 2

## 2025-01-16 MED ORDER — IOHEXOL 350 MG/ML SOLN
75.0000 mL | Freq: Once | INTRAVENOUS | Status: AC | PRN
  Administered 2025-01-16: 75 mL via INTRAVENOUS

## 2025-01-16 MED ORDER — DEXAMETHASONE SOD PHOSPHATE PF 10 MG/ML IJ SOLN
INTRAMUSCULAR | Status: DC | PRN
  Administered 2025-01-16: 10 mg via INTRAVENOUS

## 2025-01-16 MED ORDER — MAGNESIUM SULFATE 4 GM/100ML IV SOLN
4.0000 g | Freq: Once | INTRAVENOUS | Status: AC
  Administered 2025-01-16: 4 g via INTRAVENOUS
  Filled 2025-01-16: qty 100

## 2025-01-16 MED ORDER — LACTATED RINGERS IV SOLN: INTRAVENOUS | Status: DC

## 2025-01-16 MED ORDER — HEPARIN SODIUM (PORCINE) 1000 UNIT/ML IJ SOLN
INTRAMUSCULAR | Status: DC | PRN
  Administered 2025-01-16: 8000 [IU] via INTRAVENOUS

## 2025-01-16 MED ORDER — 0.9 % SODIUM CHLORIDE (POUR BTL) OPTIME
TOPICAL | Status: DC | PRN
  Administered 2025-01-16: 1000 mL

## 2025-01-16 MED ORDER — ACETAMINOPHEN 10 MG/ML IV SOLN: 1000.0000 mg | Freq: Once | INTRAVENOUS | Status: DC | PRN

## 2025-01-16 MED ORDER — POTASSIUM CHLORIDE CRYS ER 20 MEQ PO TBCR
40.0000 meq | EXTENDED_RELEASE_TABLET | Freq: Once | ORAL | Status: AC
  Administered 2025-01-16: 40 meq via ORAL
  Filled 2025-01-16: qty 2

## 2025-01-16 MED ORDER — FENTANYL CITRATE (PF) 100 MCG/2ML IJ SOLN
INTRAMUSCULAR | Status: AC
  Filled 2025-01-16: qty 2

## 2025-01-16 MED ORDER — HEPARIN SODIUM (PORCINE) 1000 UNIT/ML IJ SOLN
INTRAMUSCULAR | Status: AC
  Filled 2025-01-16: qty 30

## 2025-01-16 MED ORDER — LIDOCAINE 2% (20 MG/ML) 5 ML SYRINGE
INTRAMUSCULAR | Status: DC | PRN
  Administered 2025-01-16: 60 mg via INTRAVENOUS

## 2025-01-16 MED ORDER — METOPROLOL TARTRATE 5 MG/5ML IV SOLN
INTRAVENOUS | Status: AC
  Filled 2025-01-16: qty 5

## 2025-01-16 MED ORDER — SODIUM CHLORIDE 0.9 % IV SOLN: 500.0000 mL | Freq: Once | INTRAVENOUS | Status: AC | PRN

## 2025-01-16 MED ORDER — PHENYLEPHRINE 80 MCG/ML (10ML) SYRINGE FOR IV PUSH (FOR BLOOD PRESSURE SUPPORT)
PREFILLED_SYRINGE | INTRAVENOUS | Status: DC | PRN
  Administered 2025-01-16: 80 ug via INTRAVENOUS
  Administered 2025-01-16 (×3): 160 ug via INTRAVENOUS
  Administered 2025-01-16 (×2): 80 ug via INTRAVENOUS
  Administered 2025-01-16: 160 ug via INTRAVENOUS

## 2025-01-16 MED ORDER — PROPOFOL 10 MG/ML IV BOLUS
INTRAVENOUS | Status: AC
  Filled 2025-01-16: qty 20

## 2025-01-16 MED ORDER — FENTANYL CITRATE (PF) 100 MCG/2ML IJ SOLN: 25.0000 ug | INTRAMUSCULAR | Status: DC | PRN

## 2025-01-16 MED ORDER — OXYCODONE HCL 5 MG PO TABS: 5.0000 mg | ORAL_TABLET | ORAL | Status: AC | PRN

## 2025-01-16 MED ORDER — METOPROLOL TARTRATE 5 MG/5ML IV SOLN: 2.5000 mg | INTRAVENOUS | Status: AC | PRN

## 2025-01-16 MED ORDER — PROPOFOL 10 MG/ML IV BOLUS
INTRAVENOUS | Status: DC | PRN
  Administered 2025-01-16: 90 mg via INTRAVENOUS
  Administered 2025-01-16 (×2): 20 mg via INTRAVENOUS

## 2025-01-16 MED ORDER — ONDANSETRON HCL 4 MG/2ML IJ SOLN
INTRAMUSCULAR | Status: DC | PRN
  Administered 2025-01-16: 4 mg via INTRAVENOUS

## 2025-01-16 MED ORDER — PHENYLEPHRINE 80 MCG/ML (10ML) SYRINGE FOR IV PUSH (FOR BLOOD PRESSURE SUPPORT)
PREFILLED_SYRINGE | INTRAVENOUS | Status: AC
  Filled 2025-01-16: qty 10

## 2025-01-16 MED ORDER — FENTANYL CITRATE (PF) 50 MCG/ML IJ SOSY: 25.0000 ug | PREFILLED_SYRINGE | INTRAMUSCULAR | Status: AC | PRN

## 2025-01-16 MED ORDER — FENTANYL CITRATE (PF) 250 MCG/5ML IJ SOLN
INTRAMUSCULAR | Status: DC | PRN
  Administered 2025-01-16: 100 ug via INTRAVENOUS
  Administered 2025-01-16: 50 ug via INTRAVENOUS

## 2025-01-16 MED ORDER — VANCOMYCIN HCL 1000 MG IV SOLR
1000.0000 mg | Freq: Two times a day (BID) | INTRAVENOUS | Status: AC
  Administered 2025-01-16: 1000 mg via INTRAVENOUS
  Filled 2025-01-16: qty 20

## 2025-01-16 MED ORDER — ROCURONIUM BROMIDE 10 MG/ML (PF) SYRINGE
PREFILLED_SYRINGE | INTRAVENOUS | Status: AC
  Filled 2025-01-16: qty 20

## 2025-01-16 MED ORDER — OXYCODONE HCL 5 MG PO TABS: 5.0000 mg | ORAL_TABLET | Freq: Once | ORAL | Status: DC | PRN

## 2025-01-16 MED ORDER — LORAZEPAM 2 MG/ML IJ SOLN: 1.0000 mg | Freq: Once | INTRAMUSCULAR | Status: AC | PRN

## 2025-01-16 MED ORDER — ASPIRIN 81 MG PO TBEC: 81.0000 mg | DELAYED_RELEASE_TABLET | Freq: Every day | ORAL | Status: AC

## 2025-01-16 MED ORDER — IODIXANOL 320 MG/ML IV SOLN
INTRAVENOUS | Status: DC | PRN
  Administered 2025-01-16: 156 mL via INTRA_ARTERIAL

## 2025-01-16 MED ORDER — CHLORHEXIDINE GLUCONATE 0.12 % MT SOLN
15.0000 mL | Freq: Once | OROMUCOSAL | Status: AC
  Administered 2025-01-16: 15 mL via OROMUCOSAL
  Filled 2025-01-16: qty 15

## 2025-01-16 MED ORDER — ORAL CARE MOUTH RINSE: 15.0000 mL | Freq: Once | OROMUCOSAL | Status: AC

## 2025-01-16 MED ORDER — LACTATED RINGERS IV SOLN: INTRAVENOUS | Status: DC | PRN

## 2025-01-16 MED ORDER — HEPARIN SODIUM (PORCINE) 5000 UNIT/ML IJ SOLN: 5000.0000 [IU] | Freq: Three times a day (TID) | INTRAMUSCULAR | Status: AC

## 2025-01-16 MED ORDER — DEXAMETHASONE SOD PHOSPHATE PF 10 MG/ML IJ SOLN
INTRAMUSCULAR | Status: AC
  Filled 2025-01-16: qty 1

## 2025-01-16 MED ORDER — SUGAMMADEX SODIUM 200 MG/2ML IV SOLN
INTRAVENOUS | Status: DC | PRN
  Administered 2025-01-16: 200 mg via INTRAVENOUS

## 2025-01-16 MED ORDER — ARTIFICIAL TEARS OPHTHALMIC OINT
TOPICAL_OINTMENT | OPHTHALMIC | Status: AC
  Filled 2025-01-16: qty 3.5

## 2025-01-16 MED ORDER — PROTAMINE SULFATE 10 MG/ML IV SOLN
INTRAVENOUS | Status: DC | PRN
  Administered 2025-01-16: 25 mg via INTRAVENOUS

## 2025-01-16 MED ORDER — VANCOMYCIN HCL 1500 MG/300ML IV SOLN
1500.0000 mg | INTRAVENOUS | Status: AC
  Filled 2025-01-16: qty 300

## 2025-01-16 MED ORDER — HYDRALAZINE HCL 20 MG/ML IJ SOLN: 5.0000 mg | INTRAMUSCULAR | Status: AC | PRN

## 2025-01-16 MED ORDER — GADOBUTROL 1 MMOL/ML IV SOLN
10.0000 mL | Freq: Once | INTRAVENOUS | Status: AC | PRN
  Administered 2025-01-16: 10 mL via INTRAVENOUS

## 2025-01-16 MED ORDER — HEPARIN 6000 UNIT IRRIGATION SOLUTION
Status: DC | PRN
  Administered 2025-01-16: 1

## 2025-01-16 MED ORDER — MIDAZOLAM HCL 2 MG/2ML IJ SOLN
INTRAMUSCULAR | Status: AC
  Filled 2025-01-16: qty 2

## 2025-01-16 MED ORDER — ROCURONIUM BROMIDE 10 MG/ML (PF) SYRINGE
PREFILLED_SYRINGE | INTRAVENOUS | Status: DC | PRN
  Administered 2025-01-16: 60 mg via INTRAVENOUS
  Administered 2025-01-16 (×2): 20 mg via INTRAVENOUS

## 2025-01-16 MED ORDER — ONDANSETRON HCL 4 MG/2ML IJ SOLN: 4.0000 mg | Freq: Once | INTRAMUSCULAR | Status: DC | PRN

## 2025-01-16 MED ORDER — PHENYLEPHRINE HCL-NACL 20-0.9 MG/250ML-% IV SOLN
INTRAVENOUS | Status: DC | PRN
  Administered 2025-01-16: 80 ug/min via INTRAVENOUS

## 2025-01-16 NOTE — Progress Notes (Signed)
 Patient received from PACU, she is alert and oriented, denies any pain, right groin site is soft without any hematoma, CCMD notified, V/S obtained, all needs met, call bell in reach.   01/16/25 1816  Vitals  Temp 98.2 F (36.8 C)  Temp Source Oral  BP 135/80  MAP (mmHg) 97  BP Location Right Arm  BP Method Automatic  Patient Position (if appropriate) Lying  Pulse Rate 97  Pulse Rate Source Monitor  ECG Heart Rate (!) 105  Resp 17  Level of Consciousness  Level of Consciousness Alert  MEWS COLOR  MEWS Score Color Green  Oxygen Therapy  SpO2 93 %  O2 Device Nasal Cannula  O2 Flow Rate (L/min) 2 L/min  Art Line  Arterial Line BP 122/84  Arterial Line MAP (mmHg) 100 mmHg  Pain Assessment  Pain Scale 0-10  Pain Score 0  MEWS Score  MEWS Temp 0  MEWS Systolic 0  MEWS Pulse 1  MEWS RR 0  MEWS LOC 0  MEWS Score 1

## 2025-01-16 NOTE — Progress Notes (Signed)
 Chaplain responded to unit page - pt Ann [name] requesting priest before surgical procedure. Sister Ann bedside. Procedure was to occur in the next 10 minutes, so securing a priest for [employer] (as was Ann [name]'s request) was unfortunately not possible.   Ann [name] was agreeable to prayer and blessing instead. She requests a priest once she is in recovery. Sharlet and Jenkins both expressed gratitude for chaplain presence, and we will follow up and continue to remain available as needs arise.

## 2025-01-16 NOTE — ED Notes (Signed)
Bedpan removed

## 2025-01-16 NOTE — Progress Notes (Signed)
 Heart Failure Navigator Progress Note  Assessed for Heart & Vascular TOC clinic readiness.  Patient does not meet criteria due to per MD note patient with Cognitive impairment. No HF TOC. .   Navigator will sign off at this time.   Stephane Haddock, BSN, Scientist, Clinical (histocompatibility And Immunogenetics) Only

## 2025-01-16 NOTE — ED Notes (Signed)
 Patient transported to MRI

## 2025-01-16 NOTE — Consult Note (Signed)
 " Hospital Consult    Reason for Consult: Innominate pseudoaneurysm  MRN #:  [id number]  History of Present Illness: This is a 74 y.o. female who was recently admitted for hypovolemic shock towards the end of January and discharged just a few days ago.  She has a history of A-fib on Pradaxa  as well as chronic diarrhea.  She lives alone and reports she is able to perform her own ADLs.  She has had chest pain for the last 2 days which is why she presented to the ED this morning.  She denies any previous vascular or cardiac surgery.  She denies having any episodes of chest pain like this in the past.  She is a former smoker and quit in 1998.  Past Medical History:  Diagnosis Date   Acute metabolic acidosis 11/09/2024   Arthritis    osteoarthritis- knees.   Atrial fibrillation (HCC)    Dr. Debby status post cardioversion chronic anticoagulation   Basal ganglia stroke (HCC) 07/24/2023   Carotid artery occlusion    Cellulitis of right lower extremity 12/30/2020   Chicken pox    Colon cancer (HCC) 2001   Radiation, chemo.  Adenocarcinoma of the colon.  Followed by Dr. Dallie   Complication of anesthesia    sensitive to narcoticsextra sleepy. Versed - no memory recall for 4 days postop   Diabetes mellitus without complication (HCC)    oral and Insulin    Diarrhea 07/09/2023   Diverticulosis 07/23/2023   Hepatitis    sub clinical case of Hepatitis in late 70'shas immunities   Hypertension    Hypothyroidism    Laceration of head 08/04/2019   8 cm laceration forehead/scalp after tripping when mowing her lawn.   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   Ovarian teratoma, right 09/2000   Oophorectomy   Sleep apnea    unable to tolreate cpap   Wisdom teeth extracted    Word finding difficulty 07/09/2023    Past Surgical History:  Procedure Laterality Date   ABDOMINAL  HYSTERECTOMY  09/2000    Total hysterectomy and oophorectomy   ABDOMINAL WOUND DEHISCENCE Bilateral    multiple times / mesh use with multiple abdominal hernia repair   CARDIOVERSION  2014   Unsuccessful.  Patient reports cardiac ablation was not felt to be appropriate next step by cardiology.   CHOLECYSTECTOMY  04/1998   '98 -Lapraroscopic   COLON RESECTION  2001   colon cancer   FOOT SURGERY Bilateral    multiple hammer toes and bunionectomies   KNEE ARTHROSCOPY Left 08/2006   meniscectomy   MELANOMA EXCISION     near scapula   TOTAL KNEE ARTHROPLASTY Right 01/24/2016   Procedure: RIGHT TOTAL KNEE ARTHROPLASTY;  Surgeon: Dempsey Moan, MD;  Location: [facility];  Service: Orthopedics;  Laterality: Right;    Allergies[1]  Prior to Admission medications  Medication Sig Start Date End Date Taking? Authorizing Provider  allopurinol  (ZYLOPRIM ) 100 MG tablet Take 1 tablet (100 mg total) by mouth daily. 08/05/24  Yes Kuneff, Renee A, DO  colestipol (COLESTID) 1 g tablet Take 1 g by mouth 2 (two) times daily. 12/18/24  Yes [provider]  dabigatran  (PRADAXA ) 150 MG CAPS capsule Take 1 capsule (150 mg total) by mouth 2 (two) times daily. 12/16/24  Yes Ladona Heinz, MD  EPIPEN 2-PAK 0.3 MG/0.3ML SOAJ injection Inject 0.3 mg into the  skin as needed. Bee stings 11/11/15  Yes [provider]  Insulin  Glargine (BASAGLAR  KWIKPEN) 100 UNIT/ML Inject 7 Units into the skin every morning. Patient taking differently: Inject 7 Units into the skin daily. 01/13/25  Yes Mdala-Gausi, Masiku Agatha, MD  levothyroxine  (SYNTHROID ) 50 MCG tablet Take 1 tablet (50 mcg total) by mouth daily before breakfast. 08/05/24  Yes Kuneff, Renee A, DO  Multiple Vitamin (MULTIVITAMIN WITH MINERALS) TABS tablet Take 1 tablet by mouth daily. 01/14/25  Yes Mdala-Gausi, Masiku Agatha, MD  pantoprazole  (PROTONIX ) 40 MG tablet Take 40 mg by mouth every morning. 12/17/24  Yes [provider]  zolpidem  (AMBIEN ) 5 MG  tablet Take 1 tablet (5 mg total) by mouth at bedtime. 01/13/25  Yes Mdala-Gausi, Masiku Agatha, MD  [Paused] metoprolol  tartrate (LOPRESSOR ) 50 MG tablet Take 1 tablet (50 mg total) by mouth 2 (two) times daily. Patient not taking: Reported on 01/16/2025 Wait to take this until your doctor or other care provider tells you to start again. 11/20/24   Kuneff, Renee A, DO  [Paused] olmesartan  (BENICAR ) 40 MG tablet Take 1 tablet (40 mg total) by mouth daily. Patient not taking: Reported on 01/16/2025 Wait to take this until your doctor or other care provider tells you to start again. 11/21/24   Catherine Charlies LABOR, DO    Social History   Socioeconomic History   Marital status: Single    Spouse name: Not on file   Number of children: 0   Years of education: 12   Highest education level: Not on file  Occupational History   Not on file  Tobacco Use   Smoking status: Former    Current packs/day: 0.00    Types: Cigarettes    Start date: 01/18/1972    Quit date: 01/17/1997    Years since quitting: 28.0   Smokeless tobacco: Never  Vaping Use   Vaping status: Never Used  Substance and Sexual Activity   Alcohol use: No   Drug use: No   Sexual activity: Not Currently  Other Topics Concern   Not on file  Social History Narrative   Marital status/children/pets: Single. G0P0.   Education/employment: Retired Charity Fundraiser:      -smoke alarm in the home:Yes     - wears seatbelt: Yes     - Feels safe in their relationships: Yes   Right handed   Drinks caffeine prn   Lives alone   Two story home   retired   Social Drivers of Health   Tobacco Use: Medium Risk (01/15/2025)   Patient History    Smoking Tobacco Use: Former    Smokeless Tobacco Use: Never    Passive Exposure: Not on Actuary Strain: Low Risk (07/16/2024)   Overall Financial Resource Strain (CARDIA)    Difficulty of Paying Living Expenses: Not hard at all  Food Insecurity: No Food  Insecurity (01/10/2025)   Epic    Worried About Programme Researcher, Broadcasting/film/video in the Last Year: Never true    Ran Out of Food in the Last Year: Never true  Transportation Needs: No Transportation Needs (01/10/2025)   Epic    Lack of Transportation (Medical): No    Lack of Transportation (Non-Medical): No  Physical Activity: Inactive (07/16/2024)   Exercise Vital Sign    Days of Exercise per Week: 0 days    Minutes of Exercise per Session: 0 min  Stress: No Stress Concern Present (07/16/2024)   [employer] of Occupational  Health - Occupational Stress Questionnaire    Feeling of Stress: Not at all  Social Connections: Moderately Isolated (01/10/2025)   Social Connection and Isolation Panel    Frequency of Communication with Friends and Family: Three times a week    Frequency of Social Gatherings with Friends and Family: Three times a week    Attends Religious Services: More than 4 times per year    Active Member of Clubs or Organizations: No    Attends Banker Meetings: Never    Marital Status: Never married  Intimate Partner Violence: Not At Risk (01/10/2025)   Epic    Fear of Current or Ex-Partner: No    Emotionally Abused: No    Physically Abused: No    Sexually Abused: No  Depression (PHQ2-9): Low Risk (11/20/2024)   Depression (PHQ2-9)    PHQ-2 Score: 4  Alcohol Screen: Low Risk (07/16/2024)   Alcohol Screen    Last Alcohol Screening Score (AUDIT): 0  Housing: Low Risk (01/10/2025)   Epic    Unable to Pay for Housing in the Last Year: No    Number of Times Moved in the Last Year: 0    Homeless in the Last Year: No  Utilities: Not At Risk (01/10/2025)   Epic    Threatened with loss of utilities: No  Health Literacy: Adequate Health Literacy (07/16/2024)   B1300 Health Literacy    Frequency of need for help with medical instructions: Never    Family History  Problem Relation Age of Onset   Arthritis Mother    Colon cancer Mother    Diabetes Mother    Atrial  fibrillation Mother    Diabetes Father    Heart attack Father    Heart disease Father    Stroke Father    Hypertension Maternal Grandmother    Stroke Maternal Grandmother    Stroke Maternal Grandfather    Hypertension Paternal Grandmother    Stroke Paternal Grandmother    Diabetes Paternal Grandfather    Hypertension Paternal Grandfather    Stroke Paternal Grandfather    Diabetes Brother    Brain cancer Brother    Heart disease Brother    Hypertension Brother    Stroke Brother     ROS: Otherwise negative unless mentioned in HPI  Physical Examination  Vitals:   01/16/25 0615 01/16/25 0748  BP: (!) 133/99 (!) 177/118  Pulse: 87 98  Resp: 15 18  Temp:  97.7 F (36.5 C)  SpO2: 98% 100%   Body mass index is 30.41 kg/m.  General: no acute distress Cardiac: hemodynamically stable, tachycardic to low 100s, hypertensive 150s over 110s Neuro: alert, no focal deficit Extremities: No cyanosis Vascular:   Right: Palpable radial, femoral  Left: Palpable radial, femoral   Data:   CT and MRI reviewed.  There appears to be an innominate irregularity, possible pseudoaneurysm.  This was present on the CT chest abdomen pelvis from 01/08/2025 but was not present on the last time this area was imaged on 07/18/2023 with a CTA head and neck. The maximal diameter appears to be 3.8 measured on the MRI on the coronal images.  And involves the ostium of the innominate and tracks up fairly close to the bifurcation.  Echo reviewed from 01/09/2025, no evidence of vegetations.    ASSESSMENT/PLAN: This is a 74 y.o. female and innominate irregularity, possible pseudoaneurysm.  She has been having chest pain for the last 2 days.  She does not have any history of dissection, chest  injury or surgery.  I worried that this could be infectious related, blood cultures are pending.  Her echo from a few days ago did not note any vegetations.  Given the appearance on imaging and her continued chest pain I  recommend impulse control with a heart rate less than 80 and a systolic blood pressure less than 120. Also recommend consult to [facility], appreciate their assistance and recommendations.   Norman GORMAN Serve MD [facility] [phone] 01/16/2025  8:20 AM     [1]  Allergies Allergen Reactions   Amoxicillin Anaphylaxis, Shortness Of Breath and Swelling    Has patient had a PCN reaction causing immediate rash, facial/tongue/throat swelling, SOB or lightheadedness with hypotension: Yes Has patient had a PCN reaction causing severe rash involving mucus membranes or skin necrosis: No Has patient had a PCN reaction that required hospitalization No Has patient had a PCN reaction occurring within the last 10 years: Yes   Statins Other (See Comments)    Muscle skeletal side effects   Ampicillin Other (See Comments)    Reaction unknown   Codeine Itching    Tolerates hydrocodone    Ezetimibe Other (See Comments)    Reaction unknown   Livalo [Pitavastatin] Other (See Comments)    Reaction unknown   Penicillins Other (See Comments)    Reaction unknown   "

## 2025-01-16 NOTE — Progress Notes (Signed)
" °  IR BRIEF PROGRESS NOTE:  IR was requested for fluoroscopically guided aspiration of right hip fluid collection noted on CT A/P w/ cm performed yesterday. The request was reviewed by Dr. Karalee, who instead recommends an ultrasound aspiration of subcutaneous fluid collection in IR with possible drain placement, as the collection does not involve the hip joint. IR has seen and consented patient, and will attempt procedure today under local anesthetic only , schedule allowing. Care team is aware.  Patient is noted with large body habitus, precluding palpation of any fluctuance at site of fluid collection. Patient is non-tender to palpation, and endorses no pain. She was unaware this collection was otherwise present. VSS for procedure. Patient is NPO per Care Team, and not required for aspiration vs. drain placement with IR. No contraindications noted with medications. Patient is noted with a severe Amoxicillin allergy, as well as an allergy to Codeine (itching - tolerates hydrocodone ).   Electronically Signed: Carlin DELENA Griffon, PA-C 01/16/2025, 11:05 AM      "

## 2025-01-16 NOTE — Op Note (Addendum)
 "   Patient name: Ann [name] MRN: [id number] DOB: [birth year] Sex: female  01/16/2025 Pre-operative Diagnosis:  Innominate artery pseudoaneurysm Post-operative diagnosis:  Same Surgeon:  Penne C. [name], MD Assistant: Adina Sender, PA Procedure Performed: 1.  Percutaneous ultrasound-guided cannulation and Pro-glide device closure right common femoral artery 2.  Catheter selection of aortic arch and arch aortogram and catheter selection of right innominate and common carotid arteries 3.  Stent innominate artery with 11 x 39 mm VBX x 2, proximally postdilated with 16 mm balloon  Indications: 74 year old female found to have large innominate artery pseudoaneurysm.  After discussions with the patient's family and cardiothoracic surgery we have elected to attempt stenting.  Risk benefits and alternatives were discussed with the patient including the risk of stroke, the risk of not achieving adequate seal of the pseudoaneurysm, the risk of future infection if this has underlying infectious etiology.  All questions were answered they demonstrate good understanding and consent was signed.  Assistant was necessary to facilitate percutaneous access with passage of catheters wires and stents and for percutaneous closure at completion.  Findings: The aortic arch was type II with heavily calcified arch vessels including the left subclavian artery which is subtotally occluded.  The right vertebral artery is dominant.  There appears to be bilateral common carotid artery stenosis leading into the ICA stenosis.  After placing a single 11 x 39 mm VBX I postdilated with 14 mm balloon and the pseudoaneurysm had persistent filling on the outer curve of the aorta and the patient right side of the innominate.  The second VBX was placed into the aorta and postdilated with a 16 mm balloon and this appeared to have mostly sealed at completion with may be minimal flow into the pseudoaneurysm which will hopefully thrombose  with reversal of heparinization.  Patient was neurologically intact upon completion.   Procedure:  The patient was identified in the holding area and taken to the operating room where she was put supine upper table and general anesthesia was induced.  She was sterilely prepped and draped in the bilateral groins in usual fashion, antibiotics were administered and a timeout was called.  We began using ultrasound to identify the right common femoral artery which was heavily calcified particularly posteriorly.  Highly common femoral artery near the extrailiac artery border we were able to cannulate with a micropuncture needle followed by wire and sheath.  We then placed a Bentson wire followed by a 5 French sheath.  The patient was fully heparinized and ACT returned greater than 300.  We then placed the Bentson wire into the arch of the aorta and placed the pigtail catheter and performed aortogram.  This demonstrated a type II arch.  I attempted to cannulate the innominate artery using vert catheter initially and then used an H2 catheter.  We were able to cannulate the innominate artery easily and placed a wire into the common carotid artery.  I did attempt to get a Glidewire into the subclavian artery but there was significant stenosis there which made selecting the subclavian artery difficult.  Ultimately I elected to place an Amplatz wire into the common carotid artery and then placed a long 8 French sheath.  We then placed the first stent which was 11 x 39 mm VBX expanded to nominal pressure.  Upon removing the balloon the sheath kinked and I had difficulty and ultimately I placed a V18 wire through the balloon remove the balloon and sheath together placed a micropuncture sheath followed by  Bentson wire and then a 23 cm 8 French sheath.  I then recannulated the innominate artery stent and placed a wire again into the common carotid artery and ballooned with a 14 mm balloon.  Unfortunately there was a persistent  filling of the pseudoaneurysm on the patient's right innominate artery and the outer curve of the aorta.  We then elected for placing a second stent.  Ultimately I was able to select the subclavian artery but only into the right mammary artery where I placed a versa core wire.  I then placed a second VBX and dilated this to 15 atm which was almost 12 mm.  I then placed a 16 mm balloon and postdilated.  This appeared to have mostly sealed the pseudoaneurysm at completion.  Satisfied with this I removed the wire from the right mammary artery.  The 8 French sheath was retracted down to the common femoral artery and retrograde angiography demonstrated patency.  I then deployed a Pro-glide device and again there was patency after this was deployed and it was fully cinched there was a strong signal at the ankle and the wire was removed.  Protamine  was administered and pressure was held until hemostasis was obtained.  She was then awakened from anesthesia having tolerated the procedure well without Ameeth complication and noted to be neurologically intact and was transferred to the PACU in stable condition.  All counts were correct at completion.   EBL: 250cc  Contrast: 156cc  Jewelle Whitner C. [name], MD [facility] Office: [phone] Pager: [phone]   "

## 2025-01-16 NOTE — Anesthesia Procedure Notes (Signed)
 Procedure Name: Intubation Date/Time: 01/16/2025 2:27 PM  Performed by: Alen Motto D, CRNAPre-anesthesia Checklist: Patient identified, Emergency Drugs available, Suction available and Patient being monitored Patient Re-evaluated:Patient Re-evaluated prior to induction Oxygen Delivery Method: Circle System Utilized Preoxygenation: Pre-oxygenation with 100% oxygen Induction Type: IV induction Ventilation: Mask ventilation without difficulty Laryngoscope Size: Mac and 2 Grade View: Grade II Tube type: Oral Tube size: 7.0 mm Number of attempts: 1 Airway Equipment and Method: Stylet and Oral airway Placement Confirmation: ETT inserted through vocal cords under direct vision, positive ETCO2 and breath sounds checked- equal and bilateral Secured at: 21 cm Tube secured with: Tape Dental Injury: Teeth and Oropharynx as per pre-operative assessment

## 2025-01-16 NOTE — Anesthesia Preprocedure Evaluation (Signed)
"                                    Anesthesia Evaluation  Patient identified by MRN, date of birth, ID band Patient awake    History of Anesthesia Complications (+) history of anesthetic complications  Airway        Dental   Pulmonary sleep apnea , former smoker          Cardiovascular hypertension, + CAD, + Peripheral Vascular Disease and +CHF  + dysrhythmias Atrial Fibrillation   innominate irregularity, possible pseudoaneurysm  IMPRESSIONS     1. Left ventricular ejection fraction, by estimation, is 65 to 70%. Left  ventricular ejection fraction by 3D volume is 77 %. The left ventricle has  normal function. The left ventricle has no regional wall motion  abnormalities. There is mild concentric  left ventricular hypertrophy. Diastology is indeterminate due to atrial  fibrillation.   2. Right ventricular systolic function is normal. The right ventricular  size is normal. There is mildly elevated pulmonary artery systolic  pressure.   3. Left atrial size was severely dilated.   4. Right atrial size was mildly dilated.   5. The mitral valve is normal in structure. Mild mitral valve  regurgitation. No evidence of mitral stenosis.   6. Tricuspid valve regurgitation is mild to moderate.   7. The aortic valve is tricuspid. Aortic valve regurgitation is not  visualized. Mild aortic valve stenosis. Aortic valve area, by VTI measures  1.86 cm. Aortic valve mean gradient measures 14.0 mmHg. Aortic valve Vmax  measures 2.56 m/s.   8. The inferior vena cava is dilated in size with <50% respiratory  variability, suggesting right atrial pressure of 15 mmHg.      Neuro/Psych   Anxiety     CVA    GI/Hepatic ,,,(+) Hepatitis -  Endo/Other  diabetesHypothyroidism    Renal/GU ARFRenal disease     Musculoskeletal  (+) Arthritis ,    Abdominal   Peds  Hematology   Anesthesia Other Findings   Reproductive/Obstetrics                               Anesthesia Physical Anesthesia Plan  ASA: 3  Anesthesia Plan: General   Post-op Pain Management:    Induction: Intravenous  PONV Risk Score and Plan: 2 and Ondansetron  and Dexamethasone   Airway Management Planned: Oral ETT  Additional Equipment: Arterial line  Intra-op Plan:   Post-operative Plan: Extubation in OR  Informed Consent: I have reviewed the patients History and Physical, chart, labs and discussed the procedure including the risks, benefits and alternatives for the proposed anesthesia with the patient or authorized representative who has indicated his/her understanding and acceptance.     Dental advisory given  Plan Discussed with: CRNA  Anesthesia Plan Comments:          Anesthesia Quick Evaluation  "

## 2025-01-16 NOTE — Progress Notes (Signed)
" °  °  I have reviewed the patient's CT scan discussed the results with the patient and her daughter as well as with Dr. Lucas with cardiothoracic surgery.  Briefly, it appears she has a large pseudoaneurysm off of the proximal innominate artery and given her deconditioned state she would be high risk for open repair.  Given the radiographic appearance of this pseudoaneurysm we will plan for attempted stenting today in the OR.  We discussed the risks primarily of stroke, risk of not achieving a seal given the location and risk of stent graft infection given concern for underlying infectious etiology.  Patient and her sister who is her healthcare power of attorney demonstrate good understanding of the situation as well as need for repair.  Will plan for OR today with innominate artery stenting, femoral approach.  All questions were answered and they demonstrate good understanding.   [name] [name], MD [facility] Office: [phone] Pager: [phone]   "

## 2025-01-16 NOTE — ED Notes (Signed)
 Patient transported to CT

## 2025-01-16 NOTE — ED Notes (Signed)
 Patient remains on bedpan states she isn't ready to come off

## 2025-01-16 NOTE — Consult Note (Cosign Needed)
 Reason for Consult:Right hip abscess Referring Physician: Leatrice Chapel Time called: 0730 Time at bedside: 0910   Ann [name] is an 74 y.o. female.  HPI: Dennys was admitted last night with chest pain. She was found incidentally to have a right hip fluid collection that is likely abscess. She denies any pain in the area. She denies fevers, chills, sweats, N/V but has had a lot of fatigue.  Past Medical History:  Diagnosis Date   Acute metabolic acidosis 11/09/2024   Arthritis    osteoarthritis- knees.   Atrial fibrillation (HCC)    Dr. Debby status post cardioversion chronic anticoagulation   Basal ganglia stroke (HCC) 07/24/2023   Carotid artery occlusion    Cellulitis of right lower extremity 12/30/2020   Chicken pox    Colon cancer (HCC) 2001   Radiation, chemo.  Adenocarcinoma of the colon.  Followed by Dr. Dallie   Complication of anesthesia    sensitive to narcoticsextra sleepy. Versed - no memory recall for 4 days postop   Diabetes mellitus without complication (HCC)    oral and Insulin    Diarrhea 07/09/2023   Diverticulosis 07/23/2023   Hepatitis    sub clinical case of Hepatitis in late 70'shas immunities   Hypertension    Hypothyroidism    Laceration of head 08/04/2019   8 cm laceration forehead/scalp after tripping when mowing her lawn.   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   Ovarian teratoma, right 09/2000   Oophorectomy   Sleep apnea    unable to tolreate cpap   Wisdom teeth extracted    Word finding difficulty 07/09/2023    Past Surgical History:  Procedure Laterality Date   ABDOMINAL HYSTERECTOMY  09/2000    Total hysterectomy and oophorectomy   ABDOMINAL WOUND DEHISCENCE Bilateral    multiple times / mesh use with multiple abdominal hernia repair   CARDIOVERSION  2014   Unsuccessful.  Patient reports cardiac ablation was  not felt to be appropriate next step by cardiology.   CHOLECYSTECTOMY  04/1998   '98 -Lapraroscopic   COLON RESECTION  2001   colon cancer   FOOT SURGERY Bilateral    multiple hammer toes and bunionectomies   KNEE ARTHROSCOPY Left 08/2006   meniscectomy   MELANOMA EXCISION     near scapula   TOTAL KNEE ARTHROPLASTY Right 01/24/2016   Procedure: RIGHT TOTAL KNEE ARTHROPLASTY;  Surgeon: Dempsey Moan, MD;  Location: [facility];  Service: Orthopedics;  Laterality: Right;    Family History  Problem Relation Age of Onset   Arthritis Mother    Colon cancer Mother    Diabetes Mother    Atrial fibrillation Mother    Diabetes Father    Heart attack Father    Heart disease Father    Stroke Father    Hypertension Maternal Grandmother    Stroke Maternal Grandmother    Stroke Maternal Grandfather    Hypertension Paternal Grandmother    Stroke Paternal Grandmother    Diabetes Paternal Grandfather    Hypertension Paternal Grandfather    Stroke Paternal Grandfather    Diabetes Brother    Brain cancer Brother    Heart disease Brother    Hypertension Brother    Stroke Brother     Social History:  reports that she quit smoking about 28 years ago. Her smoking use included cigarettes. She started smoking about 53 years  ago. She has never used smokeless tobacco. She reports that she does not drink alcohol and does not use drugs.  Allergies: Allergies[1]  Medications: I have reviewed the patient's current medications.  Results for orders placed or performed during the hospital encounter of 01/15/25 (from the past 48 hours)  CBC     Status: Abnormal   Collection Time: 01/15/25  8:09 PM  Result Value Ref Range   WBC 8.5 4.0 - 10.5 K/uL   RBC 3.55 (L) 3.87 - 5.11 MIL/uL   Hemoglobin 10.8 (L) 12.0 - 15.0 g/dL   HCT 66.2 (L) 63.9 - 53.9 %   MCV 94.9 80.0 - 100.0 fL   MCH 30.4 26.0 - 34.0 pg   MCHC 32.0 30.0 - 36.0 g/dL   RDW 86.1 88.4 - 84.4 %   Platelets 108 (L) 150 - 400 K/uL   nRBC  0.0 0.0 - 0.2 %    Comment: Performed at [facility], [address], [place], [place] [address]  Troponin T, High Sensitivity     Status: Abnormal   Collection Time: 01/15/25  8:09 PM  Result Value Ref Range   Troponin T High Sensitivity 32 (H) 0 - 19 ng/L    Comment: (NOTE) Biotin concentrations > 1000 ng/mL falsely decrease TnT results.  Serial cardiac troponin measurements are suggested.  Refer to the Links section for chest pain algorithms and additional  guidance. Performed at [facility], [address] [place], [place] [address]   Protime-INR (order if Patient is taking Coumadin / Warfarin)     Status: Abnormal   Collection Time: 01/15/25  8:09 PM  Result Value Ref Range   Prothrombin Time 20.2 (H) 11.4 - 15.2 seconds   INR 1.6 (H) 0.8 - 1.2    Comment: (NOTE) INR goal varies based on device and disease states. Performed at [facility], [address] [place], [place] [address]   Pro Brain natriuretic peptide     Status: Abnormal   Collection Time: 01/15/25  8:27 PM  Result Value Ref Range   Pro Brain Natriuretic Peptide 2,753.0 (H) <300.0 pg/mL    Comment: (NOTE) Age Group        Cut-Points    Interpretation  < 50 years     450 pg/mL       NT-proBNP > 450 pg/mL indicates                                ADHF is likely              50 to 75 years  900 pg/mL      NT-proBNP > 900 pg/mL indicates          ADHF is likely  > 75 years      1800 pg/mL     NT-proBNP > 1800 pg/mL indicates          ADHF is likely                           All ages    Results between       Indeterminate. Further clinical             300 and the cut-   information is needed to determine            point for age group   if ADHF is  present.                                                             Elecsys proBNP II/ Elecsys proBNP II STAT           Cut-Point                       Interpretation  300 pg/mL                    NT-proBNP <300pg/mL indicates                              ADHF is not likely  Performed at [facility], [address] [place], [place] [address]   D-dimer, quantitative     Status: Abnormal   Collection Time: 01/15/25  8:27 PM  Result Value Ref Range   D-Dimer, Quant 1.29 (H) 0.00 - 0.50 ug/mL-FEU    Comment: (NOTE) At the manufacturer cut-off value of 0.5 g/mL FEU, this assay has a negative predictive value of 95-100%.This assay is intended for use in conjunction with a clinical pretest probability (PTP) assessment model to exclude pulmonary embolism (PE) and deep venous thrombosis (DVT) in outpatients suspected of PE or DVT. Results should be correlated with clinical presentation. Performed at [facility], [address] [place], [place] [address]   Basic metabolic panel     Status: Abnormal   Collection Time: 01/15/25  8:27 PM  Result Value Ref Range   Sodium 135 135 - 145 mmol/L   Potassium 3.5 3.5 - 5.1 mmol/L   Chloride 98 98 - 111 mmol/L   CO2 26 22 - 32 mmol/L   Glucose, Bld 187 (H) 70 - 99 mg/dL    Comment: Glucose reference range applies only to samples taken after fasting for at least 8 hours.   BUN 9 8 - 23 mg/dL   Creatinine, Ser 9.13 0.44 - 1.00 mg/dL   Calcium  7.7 (L) 8.9 - 10.3 mg/dL   GFR, Estimated >39 >39 mL/min    Comment: (NOTE) Calculated using the CKD-EPI Creatinine Equation (2021)    Anion gap 12 5 - 15    Comment: Performed at [facility], [address] [place], [place] [address]  Resp panel by RT-PCR (RSV, Flu A&B, Covid) Anterior Nasal Swab     Status: None   Collection Time: 01/15/25  8:45 PM   Specimen: Anterior Nasal Swab  Result Value Ref Range   SARS Coronavirus 2 by RT PCR NEGATIVE NEGATIVE   Influenza A by PCR NEGATIVE NEGATIVE   Influenza B by PCR NEGATIVE NEGATIVE    Comment: (NOTE) The Xpert Xpress SARS-CoV-2/FLU/RSV plus assay is intended as an aid in the diagnosis of influenza from Nasopharyngeal swab specimens and should not be used as a sole  basis for treatment. Nasal washings and aspirates are unacceptable for Xpert Xpress SARS-CoV-2/FLU/RSV testing.  Fact Sheet for Patients: bloggercourse.com  Fact Sheet for Healthcare Providers: seriousbroker.it  This test is not yet approved or cleared by the [place]  FDA and has been authorized for detection and/or diagnosis of SARS-CoV-2 by FDA under an Emergency Use Authorization (EUA). This EUA will remain in effect (  meaning this test can be used) for the duration of the COVID-19 declaration under Section 564(b)(1) of the Act, 21 U.S.C. section 360bbb-3(b)(1), unless the authorization is terminated or revoked.     Resp Syncytial Virus by PCR NEGATIVE NEGATIVE    Comment: (NOTE) Fact Sheet for Patients: bloggercourse.com  Fact Sheet for Healthcare Providers: seriousbroker.it  This test is not yet approved or cleared by the [place]  FDA and has been authorized for detection and/or diagnosis of SARS-CoV-2 by FDA under an Emergency Use Authorization (EUA). This EUA will remain in effect (meaning this test can be used) for the duration of the COVID-19 declaration under Section 564(b)(1) of the Act, 21 U.S.C. section 360bbb-3(b)(1), unless the authorization is terminated or revoked.  Performed at [facility], [address] [place], [place] [address]   Blood Culture (routine x 2)     Status: None (Preliminary result)   Collection Time: 01/15/25  9:22 PM   Specimen: BLOOD  Result Value Ref Range   Specimen Description BLOOD BLOOD RIGHT HAND    Special Requests      BOTTLES DRAWN AEROBIC ONLY Blood Culture results may not be optimal due to an inadequate volume of blood received in culture bottles   Culture      NO GROWTH < 12 HOURS Performed at [facility], [address] [place], [place] [address]    Report Status PENDING   Blood Culture (routine x  2)     Status: None (Preliminary result)   Collection Time: 01/15/25  9:27 PM   Specimen: BLOOD  Result Value Ref Range   Specimen Description BLOOD RIGHT ANTECUBITAL    Special Requests      BOTTLES DRAWN AEROBIC AND ANAEROBIC Blood Culture results may not be optimal due to an inadequate volume of blood received in culture bottles   Culture      NO GROWTH < 12 HOURS Performed at [facility], [address] [place], [place] [address]    Report Status PENDING   I-stat chem 8, ED (not at [facility], [facility] or [facility])     Status: Abnormal   Collection Time: 01/15/25  9:52 PM  Result Value Ref Range   Sodium 137 135 - 145 mmol/L   Potassium 3.6 3.5 - 5.1 mmol/L   Chloride 95 (L) 98 - 111 mmol/L   BUN 9 8 - 23 mg/dL   Creatinine, Ser 9.19 0.44 - 1.00 mg/dL   Glucose, Bld 829 (H) 70 - 99 mg/dL    Comment: Glucose reference range applies only to samples taken after fasting for at least 8 hours.   Calcium , Ion 0.96 (L) 1.15 - 1.40 mmol/L   TCO2 28 22 - 32 mmol/L   Hemoglobin 11.9 (L) 12.0 - 15.0 g/dL   HCT 64.9 (L) 63.9 - 53.9 %  I-Stat Lactic Acid, ED     Status: None   Collection Time: 01/15/25  9:52 PM  Result Value Ref Range   Lactic Acid, Venous 1.1 0.5 - 1.9 mmol/L  Hepatic function panel     Status: Abnormal   Collection Time: 01/15/25 10:06 PM  Result Value Ref Range   Total Protein 6.1 (L) 6.5 - 8.1 g/dL   Albumin 3.0 (L) 3.5 - 5.0 g/dL   AST 17 15 - 41 U/L   ALT 7 0 - 44 U/L   Alkaline Phosphatase 114 38 - 126 U/L   Total Bilirubin 0.9 0.0 - 1.2 mg/dL   Bilirubin, Direct  0.4 (H) 0.0 - 0.2 mg/dL   Indirect Bilirubin 0.5 0.3 - 0.9 mg/dL    Comment: Performed at [facility], [address], [place], [place] [address]  Troponin T, High Sensitivity     Status: Abnormal   Collection Time: 01/15/25 10:06 PM  Result Value Ref Range   Troponin T High Sensitivity 34 (H) 0 - 19 ng/L    Comment: (NOTE) Biotin concentrations > 1000 ng/mL falsely decrease TnT results.  Serial  cardiac troponin measurements are suggested.  Refer to the Links section for chest pain algorithms and additional  guidance. Performed at [facility], [address], [place], [place] [address]   CBG monitoring, ED     Status: Abnormal   Collection Time: 01/16/25 12:29 AM  Result Value Ref Range   Glucose-Capillary 138 (H) 70 - 99 mg/dL    Comment: Glucose reference range applies only to samples taken after fasting for at least 8 hours.  Urinalysis, Routine w reflex microscopic -Urine, Clean Catch     Status: Abnormal   Collection Time: 01/16/25 12:38 AM  Result Value Ref Range   Color, Urine YELLOW YELLOW   APPearance CLEAR CLEAR   Specific Gravity, Urine 1.013 1.005 - 1.030   pH 5.0 5.0 - 8.0   Glucose, UA NEGATIVE NEGATIVE mg/dL   Hgb urine dipstick NEGATIVE NEGATIVE   Bilirubin Urine NEGATIVE NEGATIVE   Ketones, ur NEGATIVE NEGATIVE mg/dL   Protein, ur NEGATIVE NEGATIVE mg/dL   Nitrite NEGATIVE NEGATIVE   Leukocytes,Ua NEGATIVE NEGATIVE   RBC / HPF 0-5 0 - 5 RBC/hpf   WBC, UA 0-5 0 - 5 WBC/hpf   Bacteria, UA RARE (A) NONE SEEN   Squamous Epithelial / HPF 0-5 0 - 5 /HPF    Comment: Performed at [facility], [address] [place], [place] [address]  Basic metabolic panel     Status: Abnormal   Collection Time: 01/16/25  2:38 AM  Result Value Ref Range   Sodium 136 135 - 145 mmol/L   Potassium 3.1 (L) 3.5 - 5.1 mmol/L   Chloride 99 98 - 111 mmol/L   CO2 28 22 - 32 mmol/L   Glucose, Bld 130 (H) 70 - 99 mg/dL    Comment: Glucose reference range applies only to samples taken after fasting for at least 8 hours.   BUN 9 8 - 23 mg/dL   Creatinine, Ser 9.17 0.44 - 1.00 mg/dL   Calcium  7.3 (L) 8.9 - 10.3 mg/dL   GFR, Estimated >39 >39 mL/min    Comment: (NOTE) Calculated using the CKD-EPI Creatinine Equation (2021)    Anion gap 9 5 - 15    Comment: Performed at [facility], [address] [place], [place] [address]  Magnesium      Status: Abnormal    Collection Time: 01/16/25  2:38 AM  Result Value Ref Range   Magnesium  0.9 (LL) 1.7 - 2.4 mg/dL    Comment: Critical Value, Read Back and verified with CHANETA Hammersmith, RN at [phone] 01/16/25 [place] Performed at [facility], [address], [place], [place] [address]   CBC     Status: Abnormal   Collection Time: 01/16/25  2:38 AM  Result Value Ref Range   WBC 7.6 4.0 - 10.5 K/uL   RBC 3.62 (L) 3.87 - 5.11 MIL/uL   Hemoglobin 10.9 (L) 12.0 - 15.0 g/dL   HCT 65.6 (L) 63.9 - 53.9 %   MCV 94.8 80.0 -  100.0 fL   MCH 30.1 26.0 - 34.0 pg   MCHC 31.8 30.0 - 36.0 g/dL   RDW 86.1 88.4 - 84.4 %   Platelets 107 (L) 150 - 400 K/uL   nRBC 0.0 0.0 - 0.2 %    Comment: Performed at [facility], [address], [place], [place] [address]  CBG monitoring, ED     Status: Abnormal   Collection Time: 01/16/25  3:57 AM  Result Value Ref Range   Glucose-Capillary 121 (H) 70 - 99 mg/dL    Comment: Glucose reference range applies only to samples taken after fasting for at least 8 hours.  CBG monitoring, ED     Status: Abnormal   Collection Time: 01/16/25  8:40 AM  Result Value Ref Range   Glucose-Capillary 139 (H) 70 - 99 mg/dL    Comment: Glucose reference range applies only to samples taken after fasting for at least 8 hours.    MR CHEST W WO CONTRAST Result Date: 01/16/2025 CLINICAL DATA:  Soft tissue mass on CT scan earlier today. EXAM: MR CHEST WITH AND WITHOUT CONTRAST TECHNIQUE: Multiplanar, multisequence MR imaging of the chest was performed before and after the administration of intravenous contrast. CONTRAST:  10mL GADAVIST  GADOBUTROL  1 MMOL/ML IV SOLN COMPARISON:  Chest CTA from 1 day prior. FINDINGS: This is a markedly motion degraded study which significantly limits interpretive quality. Cardiovascular: Heart size is enlarged. No pericardial effusion. Ascending aorta measures up to 3.8 cm diameter. Anterior mediastinal soft tissue mass anterior to the brachiocephalic and left common carotid  arteries measures approximately 4.3 x 3.1 x 4.0 cm and demonstrates central nodular enhancement that mimics blood pool on all pulse sequences after IV contrast administration. Axial T2 haste imaging shows this to be intimately associated with the anterior wall of the proximal brachiocephalic artery at the origin from the aorta and than tracking anterior to the brachiocephalic artery distally up behind the sternal notch. Pre contrast imaging shows intermediate to minimally increased signal in the lesion. After contrast, there appears to be a thin extension of contrast from the anterior wall of the brachiocephalic origin to the nodular enhancement in the central aspect of the lesion (see axial postcontrast images 18-24 series 12). Review of the CTA from yesterday shows small bulge in contour along the anterior surface of the proximal brachiocephalic artery. This lesion was present but smaller on the study from 01/08/2025, but is new since a CTA neck of 07/18/2023. Imaging features raise distinct concern for a large penetrating ulcer or pseudoaneurysm. Mediastinal/Lymph Nodes:Limited assessment due to motion artifact. No definite lymphadenopathy. Lungs: Dependent collapse/consolidation noted in the lung bases with small to moderate bilateral pleural effusions. MRI is unreliable for assessment of pulmonary parenchyma. Bones: No focal suspicious marrow enhancement within the visualized bony anatomy. IMPRESSION: 1. Markedly motion degraded study which significantly limits interpretive quality. 2. Soft tissue mass described anterior to the mediastinum on the prior imaging is anterior to the brachiocephalic and left common carotid arteries and shows central nodular enhancement that mimics blood pool on all pulse sequences after IV contrast administration. There appears to be a thin extension of contrast in the region of the brachiocephalic artery origin that extends up into the nodular enhancement in the central lesion as  described above. Imaging features raise distinct concern for a large penetrating ulcer or pseudoaneurysm. Cardiothoracic/vascular surgery consult recommended. Repeat dedicated CTA of the chest with and without contrast timed for the aorta (aortic dissection protocol) would likely prove helpful to  further evaluate. Further, I would recommend IV contrast be administered through the right upper extremity to limit potential artifact from contrast in the crossing left brachiocephalic vein at the time of scanning that could be present if the left upper extremity is used for contrast injection. 3. Dependent collapse/consolidation in the lung bases with small to moderate bilateral pleural effusions. Critical Value/emergent results were called by telephone at the time of interpretation on 01/16/2025 at 5:38 am to provider TIMOTHY OPYD , who verbally acknowledged these results. Electronically Signed   By: Camellia Candle M.D.   On: 01/16/2025 05:38   CT Angio Chest PE W and/or Wo Contrast Result Date: 01/15/2025 EXAM: CTA CHEST PE WITHOUT AND WITH CONTRAST CT ABDOMEN AND PELVIS WITHOUT AND WITH CONTRAST 01/15/2025 11:15:20 PM TECHNIQUE: CTA of the chest was performed after the administration of intravenous contrast. Multiplanar reformatted images are provided for review. MIP images are provided for review. CT of the abdomen and pelvis was performed without and with the administration of intravenous contrast. Automated exposure control, iterative reconstruction, and/or weight based adjustment of the mA/kV was utilized to reduce the radiation dose to as low as reasonably achievable. COMPARISON: CT chest abdomen and pelvis 01/08/2025. CLINICAL HISTORY: Pulmonary embolism (PE) suspected, high probability. FINDINGS: CHEST: PULMONARY ARTERIES: Pulmonary arteries are adequately opacified for evaluation. No intraluminal filling defect to suggest pulmonary embolism. Main pulmonary artery is normal in caliber. MEDIASTINUM: Coronary and  aortic atherosclerotic calcifications are noted. The heart is enlarged. There are enlarged subcarinal lymph nodes measuring up to 1 cm. Enlarged right hilar lymph nodes measure up to 1 cm. There is indeterminate density in the anterior mediastinum at the level of the distal ascending measuring 2.0 x 4.1 x 1.7 cm. This was not definitely seen on the prior study. Evaluation of this region is limited secondary to adjacent dense contrast causing streak artifact in the brachiocephalic vein. LUNGS AND PLEURA: There are small to moderate bilateral pleural effusions. There is smooth intralabular septal thickening in the bilateral lower lobes. There is perihilar airspace opacity in the bilateral lower lobes with peribronchial wall thickening bilaterally centrally. No pneumothorax. SOFT TISSUES AND BONES: Degenerative changes affect the spine. No acute soft tissue abnormality. ABDOMEN AND PELVIS: LIVER: The liver is unremarkable. GALLBLADDER AND BILE DUCTS: Gallbladder is surgically absent. No biliary ductal dilatation. SPLEEN: Spleen demonstrates no acute abnormality. PANCREAS: Pancreas demonstrates no acute abnormality. ADRENAL GLANDS: Adrenal glands demonstrate no acute abnormality. KIDNEYS, URETERS AND BLADDER: No stones in the kidneys or ureters. No hydronephrosis. No perinephric or periureteral stranding. There is a small amount of air in the bladder. GI AND BOWEL: Stomach and duodenal sweep demonstrate no acute abnormality. There is sigmoid colon diverticulosis. The appendix is surgically absent. There is no bowel obstruction. No abnormal bowel wall thickening or distension. REPRODUCTIVE: Uterus is surgically absent. PERITONEUM AND RETROPERITONEUM: There is new mild presacral edema and mild body wall edema. There is no ascites. No free air. LYMPH NODES: No lymphadenopathy. BONES AND SOFT TISSUES: There are atherosclerotic calcifications of the aorta and iliac arteries. Rim enhancing fluid collection is partially imaged  in the subcutaneous tissues lateral to the right hip measuring 4.4 x 5.4 cm on image 2/109. No acute abnormality of the visualized bones. IMPRESSION: 1. No evidence of pulmonary embolism. 2. Findings most consistent with volume overload/pulmonary edema with small to moderate bilateral pleural effusions. 3. Indeterminate anterior mediastinal soft tissue density at the level of the distal ascending aorta measuring 2.0 x 4.1 x 1.7 cm, not  definitely seen previously, with evaluation limited by streak artifact from adjacent dense contrast in the brachiocephalic vein. 4. Mildly enlarged subcarinal and right hilar lymph nodes measuring up to 1.0 cm. 5. Rim-enhancing fluid collection in the subcutaneous tissues lateral to the right hip measuring 4.4 x 5.4 cm, suspicious for abscess. 6. Small amount of air in the urinary bladder, which may be related to recent instrumentation or infection. Electronically signed by: Greig Pique MD 01/15/2025 11:30 PM EST RP Workstation: [id number]   CT ABDOMEN PELVIS W CONTRAST Result Date: 01/15/2025 EXAM: CTA CHEST PE WITHOUT AND WITH CONTRAST CT ABDOMEN AND PELVIS WITHOUT AND WITH CONTRAST 01/15/2025 11:15:20 PM TECHNIQUE: CTA of the chest was performed after the administration of intravenous contrast. Multiplanar reformatted images are provided for review. MIP images are provided for review. CT of the abdomen and pelvis was performed without and with the administration of intravenous contrast. Automated exposure control, iterative reconstruction, and/or weight based adjustment of the mA/kV was utilized to reduce the radiation dose to as low as reasonably achievable. COMPARISON: CT chest abdomen and pelvis 01/08/2025. CLINICAL HISTORY: Pulmonary embolism (PE) suspected, high probability. FINDINGS: CHEST: PULMONARY ARTERIES: Pulmonary arteries are adequately opacified for evaluation. No intraluminal filling defect to suggest pulmonary embolism. Main pulmonary artery is normal in caliber.  MEDIASTINUM: Coronary and aortic atherosclerotic calcifications are noted. The heart is enlarged. There are enlarged subcarinal lymph nodes measuring up to 1 cm. Enlarged right hilar lymph nodes measure up to 1 cm. There is indeterminate density in the anterior mediastinum at the level of the distal ascending measuring 2.0 x 4.1 x 1.7 cm. This was not definitely seen on the prior study. Evaluation of this region is limited secondary to adjacent dense contrast causing streak artifact in the brachiocephalic vein. LUNGS AND PLEURA: There are small to moderate bilateral pleural effusions. There is smooth intralabular septal thickening in the bilateral lower lobes. There is perihilar airspace opacity in the bilateral lower lobes with peribronchial wall thickening bilaterally centrally. No pneumothorax. SOFT TISSUES AND BONES: Degenerative changes affect the spine. No acute soft tissue abnormality. ABDOMEN AND PELVIS: LIVER: The liver is unremarkable. GALLBLADDER AND BILE DUCTS: Gallbladder is surgically absent. No biliary ductal dilatation. SPLEEN: Spleen demonstrates no acute abnormality. PANCREAS: Pancreas demonstrates no acute abnormality. ADRENAL GLANDS: Adrenal glands demonstrate no acute abnormality. KIDNEYS, URETERS AND BLADDER: No stones in the kidneys or ureters. No hydronephrosis. No perinephric or periureteral stranding. There is a small amount of air in the bladder. GI AND BOWEL: Stomach and duodenal sweep demonstrate no acute abnormality. There is sigmoid colon diverticulosis. The appendix is surgically absent. There is no bowel obstruction. No abnormal bowel wall thickening or distension. REPRODUCTIVE: Uterus is surgically absent. PERITONEUM AND RETROPERITONEUM: There is new mild presacral edema and mild body wall edema. There is no ascites. No free air. LYMPH NODES: No lymphadenopathy. BONES AND SOFT TISSUES: There are atherosclerotic calcifications of the aorta and iliac arteries. Rim enhancing fluid  collection is partially imaged in the subcutaneous tissues lateral to the right hip measuring 4.4 x 5.4 cm on image 2/109. No acute abnormality of the visualized bones. IMPRESSION: 1. No evidence of pulmonary embolism. 2. Findings most consistent with volume overload/pulmonary edema with small to moderate bilateral pleural effusions. 3. Indeterminate anterior mediastinal soft tissue density at the level of the distal ascending aorta measuring 2.0 x 4.1 x 1.7 cm, not definitely seen previously, with evaluation limited by streak artifact from adjacent dense contrast in the brachiocephalic vein. 4. Mildly enlarged subcarinal  and right hilar lymph nodes measuring up to 1.0 cm. 5. Rim-enhancing fluid collection in the subcutaneous tissues lateral to the right hip measuring 4.4 x 5.4 cm, suspicious for abscess. 6. Small amount of air in the urinary bladder, which may be related to recent instrumentation or infection. Electronically signed by: Greig Pique MD 01/15/2025 11:30 PM EST RP Workstation: [id number]   DG Chest Portable 1 View Result Date: 01/15/2025 EXAM: 1 VIEW(S) XRAY OF THE CHEST 01/15/2025 08:20:00 PM COMPARISON: 01/08/2025 CLINICAL HISTORY: Chest pain. FINDINGS: LUNGS AND PLEURA: Very mild perihilar/infrahilar edema is suspected. Probable trace bilateral pleural effusions. No pneumothorax. HEART AND MEDIASTINUM: Cardiomegaly. BONES AND SOFT TISSUES: No acute osseous abnormality. IMPRESSION: 1. Cardiomegaly with very mild perihilar pulmonary edema. 2. Probable trace bilateral pleural effusions. Electronically signed by: Pinkie Pebbles MD 01/15/2025 08:24 PM EST RP Workstation: [id number]    Review of Systems  Constitutional:  Positive for fatigue. Negative for chills, diaphoresis and fever.  HENT:  Negative for ear discharge, ear pain, hearing loss and tinnitus.   Eyes:  Negative for photophobia and pain.  Respiratory:  Negative for cough and shortness of breath.   Cardiovascular:  Negative for  chest pain.  Gastrointestinal:  Negative for abdominal pain, nausea and vomiting.  Genitourinary:  Negative for dysuria, flank pain, frequency and urgency.  Musculoskeletal:  Negative for arthralgias, back pain, myalgias and neck pain.  Neurological:  Negative for dizziness and headaches.  Hematological:  Does not bruise/bleed easily.  Psychiatric/Behavioral:  The patient is not nervous/anxious.    Blood pressure (!) 177/118, pulse 98, temperature 97.7 F (36.5 C), temperature source Oral, resp. rate 18, height 5' 8 (1.727 m), weight 90.7 kg, SpO2 100%. Physical Exam Constitutional:      General: She is not in acute distress.    Appearance: She is well-developed. She is not diaphoretic.  HENT:     Head: Normocephalic and atraumatic.  Eyes:     General: No scleral icterus.       Right eye: No discharge.        Left eye: No discharge.     Conjunctiva/sclera: Conjunctivae normal.  Cardiovascular:     Rate and Rhythm: Normal rate and regular rhythm.  Pulmonary:     Effort: Pulmonary effort is normal. No respiratory distress.  Musculoskeletal:     Cervical back: Normal range of motion.     Comments: RLE No traumatic wounds, ecchymosis, or rash  Mild TTP right hip, no palpable mass or fluctuance  No knee or ankle effusion  Knee stable to varus/ valgus and anterior/posterior stress  Sens DPN, SPN, TN intact  Motor EHL, ext, flex, evers 5/5  DP 2+, PT 2+, No significant edema  Skin:    General: Skin is warm and dry.  Neurological:     Mental Status: She is alert.  Psychiatric:        Mood and Affect: Mood normal.        Behavior: Behavior normal.     Assessment/Plan: Right hip abscess -- Will ask IR to drain given how poor a surgical candidate she is and my inability to palpate location at bedside.    Ozell DOROTHA Ned, PA-C Orthopedic Surgery [phone] 01/16/2025, 9:14 AM     [1]  Allergies Allergen Reactions   Amoxicillin Anaphylaxis, Shortness Of Breath and  Swelling    Has patient had a PCN reaction causing immediate rash, facial/tongue/throat swelling, SOB or lightheadedness with hypotension: Yes Has patient had a PCN reaction causing severe rash involving  mucus membranes or skin necrosis: No Has patient had a PCN reaction that required hospitalization No Has patient had a PCN reaction occurring within the last 10 years: Yes   Statins Other (See Comments)    Muscle skeletal side effects   Ampicillin Other (See Comments)    Reaction unknown   Codeine Itching    Tolerates hydrocodone    Ezetimibe Other (See Comments)    Reaction unknown   Livalo [Pitavastatin] Other (See Comments)    Reaction unknown   Penicillins Other (See Comments)    Reaction unknown

## 2025-01-16 NOTE — Progress Notes (Signed)
 Pharmacy Antibiotic Note  Ann [name] is a 74 y.o. female admitted on 01/15/2025 with cellulitis.  Pharmacy has been consulted for Vancomycin  dosing. WBC WNL. Renal function ok. Right hip abscess.   Plan: Vancomycin  1500 mg IV q24h >>>Estimated AUC: 416 Trend WBC, temp, renal function  F/U infectious work-up Drug levels as indicated   Height: 5' 8 (172.7 cm) Weight: 90.7 kg (200 lb) IBW/kg (Calculated) : 63.9  Temp (24hrs), Avg:99.7 F (37.6 C), Min:98.7 F (37.1 C), Max:101.3 F (38.5 C)  Recent Labs  Lab 01/10/25 0436 01/11/25 0150 01/12/25 0514 01/13/25 0247 01/15/25 2009 01/15/25 2027 01/15/25 2152  WBC 8.4 9.2 8.4 7.4 8.5  --   --   CREATININE 2.07*  --  0.99 0.82  --  0.86 0.80  LATICACIDVEN  --   --   --   --   --   --  1.1  VANCORANDOM 15  --   --   --   --   --   --     Estimated Creatinine Clearance: 71.6 mL/min (by C-G formula based on SCr of 0.8 mg/dL).    Allergies[1]  Lynwood Mckusick, PharmD, BCPS Clinical Pharmacist Phone: [phone]     [1]  Allergies Allergen Reactions   Amoxicillin Anaphylaxis, Shortness Of Breath and Swelling    Has patient had a PCN reaction causing immediate rash, facial/tongue/throat swelling, SOB or lightheadedness with hypotension: Yes Has patient had a PCN reaction causing severe rash involving mucus membranes or skin necrosis: No Has patient had a PCN reaction that required hospitalization No Has patient had a PCN reaction occurring within the last 10 years: Yes   Ampicillin    Codeine Itching    Tolerates hydrocodone    Ezetimibe    Livalo [Pitavastatin]    Statins Other (See Comments)    Muscle skeletal side effects

## 2025-01-16 NOTE — Progress Notes (Signed)
 " PROGRESS NOTE    Ann [name]  FMW:[id number] DOB: 02-10-1951 DOA: 01/15/2025 PCP: Catherine Charlies LABOR, DO  Outpatient Specialists:     Brief Narrative:  As per H&P done on presentation:  Ann [name] is a 74 y.o. female with medical history significant for diabetes mellitus, hypothyroidism, cognitive impairment, atrial fibrillation on Pradaxa , colon cancer status postresection and radiation, OSA with CPAP intolerance, chronic diarrhea, and recent admission for hypovolemic shock with AKI who now returns with pleuritic chest pain.   Patient was discharged from the hospital to an SNF on 01/13/2025 after treatment for hypovolemic shock and AKI.  She was doing fairly well until developing chest pain shortly after dinner.  She describes pain in the central chest that radiates to the left arm which has been constant, worse with deep breath or cough, and slowly improving since arrival in the ED.  She denies any associated shortness of breath or cough and did not realize she had a fever.  She denies any hip pain, abdominal pain, or dysuria.   ED Course: Upon arrival to the ED, patient is found to be febrile to 38.5 and saturating mid 90s on room air with tachypnea, mild tachycardia, and stable blood pressure.  Labs are most notable for normal creatinine, normal WBC, platelets 108,000, normal lactate, troponin 32, proBNP 2753, and D-dimer 1.29.  CTA chest is negative for PE but concerning for pulmonary edema with small to moderate bilateral pleural effusions and soft tissue density in the anterior mediastinum.  CT of the abdomen and pelvis reveals a rim-enhancing fluid collection in the subcutaneous tissues lateral to the right hip which is suspicious for abscess.   Blood cultures were collected in the ED, patient was treated with acetaminophen , Rocephin , doxycycline , and IV Lasix , and orthopedic surgery (Dr. Reyne) was consulted by the ED physician.  01/16/2025: Patient seen alongside patient's 2  sisters.  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  Other findings include heavily calcified artery vessels, including the left subclavian artery which is subtotally occluded.  IR plans to attempt aspirating the fluid collection in the subcutaneous tissue lateral today right hip.  According to the patient's sisters, patient has had chronic diarrhea for over 1 year.  Patient is known to the GI team, [facility].  Magnesium  of 0.9 and potassium of 3.1 noted, likely secondary to chronic diarrhea.  Will continue to monitor and replete abnormal electrolytes.  Continue antibiotics for now.   Assessment & Plan:   Principal Problem:   Cutaneous abscess of right hip Active Problems:   Atrial fibrillation (HCC)   Hypothyroidism   Type 2 diabetes mellitus with hyperglycemia, with long-term current use of insulin  (HCC)   Cognitive impairment   Acute on chronic heart failure with preserved ejection fraction (HFpEF) (HCC)   Thrombocytopenia   Lesion of mediastinum   Chest pain   1. Abscess  - Febrile on arrival and has CT findings concerning for subcutaneous abscess lateral to right hip  - Continue empiric vancomycin , hold Pradaxa  and keep NPO pending surgical consultation, follow cultures and clinical course   01/16/2025: The plan is for IR drainage of the right hip collection.  Continue antibiotics for now.   2. chronic HFpEF; pleural effusions   - EF was 65-70% on echo from 01/09/25  - Seems compensated.  3. Chest pain; anterior mediastinal lesion   - Initial troponin is 32; no acute ischemic changes noted on EKG; CTA chest negative for PE but reveals indeterminate soft tissue  density in anterior mediastinum     - Continue cardiac monitoring, trend troponin, check MRI chest for further characterization of anterior mediastinum density     Addendum: Called by radiology regarding the MRI which is concerning for large penetrating ulcer or pseudoaneurysm involving origin of  brachiocephalic artery. CTA dissection study ordered as recommended, discussed with RN that contrast should be injected into RUE IV. Discussed the case with Dr. Pearline of vascular surgery who will plan to see the patient this morning.  01/16/2025: Likely secondary to large innominate artery pseudoaneurysm.  Patient has undergone stenting.  Chest pain has resolved.     4. Atrial fibrillation  - Metoprolol  has been on hold since recent admission for shock, will continue to hold for now and also hold Pradaxa  pending surgical consultation   5. Type II DM  - A1c was 8.4% in December 2025  - Check CBGs and use low-intensity SSI for now    6. Thrombocytopenia  - Appears stable    7. Hypothyroidism  - Continue Synthroid      8. Cognitive impairment  - Use delirium precautions     9. Physical debility  - Anticipate return to SNF on hospital discharge       DVT prophylaxis:  Code Status: DO NOT RESUSCITATE Family Communication: 2 sisters by bedside Disposition Plan:    Consultants:  Vascular surgery team. Orthopedic surgery for right hip abscess. IR for right hip abscess  Procedures:  Stenting of large innominate artery pseudoaneurysm  Antimicrobials:  IV vancomycin    Subjective: No new complaints. No chest pain  Objective: Vitals:   01/16/25 0615 01/16/25 0748 01/16/25 1030 01/16/25 1217  BP: (!) 133/99 (!) 177/118 (!) 150/104 (!) 134/94  Pulse: 87 98 87 93  Resp: 15 18 17 20   Temp:  97.7 F (36.5 C)  97.9 F (36.6 C)  TempSrc:  Oral  Oral  SpO2: 98% 100% 96% 94%  Weight:    90.7 kg  Height:    5' 8 (1.727 m)    Intake/Output Summary (Last 24 hours) at 01/16/2025 1659 Last data filed at 01/16/2025 1658 Gross per 24 hour  Intake 3039.71 ml  Output 2500 ml  Net 539.71 ml   Filed Weights   01/15/25 2004 01/16/25 1217  Weight: 90.7 kg 90.7 kg    Examination:  General exam: Appears calm and comfortable.  Patient is obese. Respiratory system: Clear to  auscultation. Respiratory effort normal. Cardiovascular system: S1 & S2 heard,  Gastrointestinal system: Abdomen is obese, soft and nontender.  Central nervous system: Awake and alert.   Extremities: Mild edema noted.  Data Reviewed: I have personally reviewed following labs and imaging studies  CBC: Recent Labs  Lab 01/11/25 0150 01/12/25 0514 01/13/25 0247 01/15/25 2009 01/15/25 2152 01/16/25 0238  WBC 9.2 8.4 7.4 8.5  --  7.6  HGB 10.8* 10.4* 10.5* 10.8* 11.9* 10.9*  HCT 32.3* 30.7* 31.5* 33.7* 35.0* 34.3*  MCV 92.3 91.6 93.2 94.9  --  94.8  PLT 92* 96* 101* 108*  --  107*   Basic Metabolic Panel: Recent Labs  Lab 01/10/25 0436 01/12/25 0514 01/13/25 0247 01/15/25 2027 01/15/25 2152 01/16/25 0238  NA 139 138 138 135 137 136  K 3.2* 3.3* 3.6 3.5 3.6 3.1*  CL 110 105 103 98 95* 99  CO2 16* 25 26 26   --  28  GLUCOSE 162* 140* 136* 187* 170* 130*  BUN 72* 33* 22 9 9 9   CREATININE 2.07* 0.99 0.82 0.86 0.80  0.82  CALCIUM  7.9* 8.0* 8.1* 7.7*  --  7.3*  MG  --  1.4* 1.7  --   --  0.9*   GFR: Estimated Creatinine Clearance: 69.8 mL/min (by C-G formula based on SCr of 0.82 mg/dL). Liver Function Tests: Recent Labs  Lab 01/15/25 2206  AST 17  ALT 7  ALKPHOS 114  BILITOT 0.9  PROT 6.1*  ALBUMIN 3.0*   Recent Labs  Lab 01/10/25 0436  LIPASE 70*   No results for input(s): AMMONIA in the last 168 hours. Coagulation Profile: Recent Labs  Lab 01/15/25 2009  INR 1.6*   Cardiac Enzymes: No results for input(s): CKTOTAL, CKMB, CKMBINDEX, TROPONINI in the last 168 hours. BNP (last 3 results) Recent Labs    01/15/25 2027  PROBNP 2,753.0*   HbA1C: No results for input(s): HGBA1C in the last 72 hours. CBG: Recent Labs  Lab 01/13/25 1100 01/16/25 0029 01/16/25 0357 01/16/25 0840 01/16/25 1223  GLUCAP 166* 138* 121* 139* 147*   Lipid Profile: No results for input(s): CHOL, HDL, LDLCALC, TRIG, CHOLHDL, LDLDIRECT in the last 72  hours. Thyroid  Function Tests: No results for input(s): TSH, T4TOTAL, FREET4, T3FREE, THYROIDAB in the last 72 hours. Anemia Panel: No results for input(s): VITAMINB12, FOLATE, FERRITIN, TIBC, IRON, RETICCTPCT in the last 72 hours. Urine analysis:    Component Value Date/Time   COLORURINE YELLOW 01/16/2025 0038   APPEARANCEUR CLEAR 01/16/2025 0038   LABSPEC 1.013 01/16/2025 0038   PHURINE 5.0 01/16/2025 0038   GLUCOSEU NEGATIVE 01/16/2025 0038   HGBUR NEGATIVE 01/16/2025 0038   BILIRUBINUR NEGATIVE 01/16/2025 0038   BILIRUBINUR negative 03/18/2021 1105   KETONESUR NEGATIVE 01/16/2025 0038   PROTEINUR NEGATIVE 01/16/2025 0038   UROBILINOGEN 1.0 03/18/2021 1105   NITRITE NEGATIVE 01/16/2025 0038   LEUKOCYTESUR NEGATIVE 01/16/2025 0038   Sepsis Labs: @LABRCNTIP (procalcitonin:4,lacticidven:4)  ) Recent Results (from the past 240 hours)  Blood Culture (routine x 2)     Status: None   Collection Time: 01/08/25  2:20 PM   Specimen: BLOOD  Result Value Ref Range Status   Specimen Description BLOOD RIGHT ANTECUBITAL  Final   Special Requests   Final    BOTTLES DRAWN AEROBIC AND ANAEROBIC Blood Culture adequate volume   Culture   Final    NO GROWTH 5 DAYS Performed at [facility], [address] [place], [place] [address]    Report Status 01/13/2025 FINAL  Final  Blood Culture (routine x 2)     Status: None   Collection Time: 01/08/25  2:25 PM   Specimen: BLOOD  Result Value Ref Range Status   Specimen Description BLOOD SITE NOT SPECIFIED  Final   Special Requests   Final    BOTTLES DRAWN AEROBIC ONLY Blood Culture results may not be optimal due to an inadequate volume of blood received in culture bottles   Culture   Final    NO GROWTH 5 DAYS Performed at [facility], [address], [place], [place] [address]    Report Status 01/13/2025 FINAL  Final  Resp panel by RT-PCR (RSV, Flu A&B, Covid) Anterior Nasal Swab     Status: None    Collection Time: 01/08/25  3:08 PM   Specimen: Anterior Nasal Swab  Result Value Ref Range Status   SARS Coronavirus 2 by RT PCR NEGATIVE NEGATIVE Final   Influenza A by PCR NEGATIVE NEGATIVE Final   Influenza B by PCR NEGATIVE NEGATIVE Final    Comment: (NOTE) The Xpert Xpress SARS-CoV-2/FLU/RSV  plus assay is intended as an aid in the diagnosis of influenza from Nasopharyngeal swab specimens and should not be used as a sole basis for treatment. Nasal washings and aspirates are unacceptable for Xpert Xpress SARS-CoV-2/FLU/RSV testing.  Fact Sheet for Patients: bloggercourse.com  Fact Sheet for Healthcare Providers: seriousbroker.it  This test is not yet approved or cleared by the [place]  FDA and has been authorized for detection and/or diagnosis of SARS-CoV-2 by FDA under an Emergency Use Authorization (EUA). This EUA will remain in effect (meaning this test can be used) for the duration of the COVID-19 declaration under Section 564(b)(1) of the Act, 21 U.S.C. section 360bbb-3(b)(1), unless the authorization is terminated or revoked.     Resp Syncytial Virus by PCR NEGATIVE NEGATIVE Final    Comment: (NOTE) Fact Sheet for Patients: bloggercourse.com  Fact Sheet for Healthcare Providers: seriousbroker.it  This test is not yet approved or cleared by the [place]  FDA and has been authorized for detection and/or diagnosis of SARS-CoV-2 by FDA under an Emergency Use Authorization (EUA). This EUA will remain in effect (meaning this test can be used) for the duration of the COVID-19 declaration under Section 564(b)(1) of the Act, 21 U.S.C. section 360bbb-3(b)(1), unless the authorization is terminated or revoked.  Performed at [facility], [address] [place], [place] [address]   MRSA Next Gen by PCR, Nasal     Status: None   Collection Time: 01/08/25  9:18  PM   Specimen: Nasal Mucosa; Nasal Swab  Result Value Ref Range Status   MRSA by PCR Next Gen NOT DETECTED NOT DETECTED Final    Comment: (NOTE) The GeneXpert MRSA Assay (FDA approved for NASAL specimens only), is one component of a comprehensive MRSA colonization surveillance program. It is not intended to diagnose MRSA infection nor to guide or monitor treatment for MRSA infections. Test performance is not FDA approved in patients less than 65 years old. Performed at [facility], [address], [place], [place] [address]   Resp panel by RT-PCR (RSV, Flu A&B, Covid) Anterior Nasal Swab     Status: None   Collection Time: 01/15/25  8:45 PM   Specimen: Anterior Nasal Swab  Result Value Ref Range Status   SARS Coronavirus 2 by RT PCR NEGATIVE NEGATIVE Final   Influenza A by PCR NEGATIVE NEGATIVE Final   Influenza B by PCR NEGATIVE NEGATIVE Final    Comment: (NOTE) The Xpert Xpress SARS-CoV-2/FLU/RSV plus assay is intended as an aid in the diagnosis of influenza from Nasopharyngeal swab specimens and should not be used as a sole basis for treatment. Nasal washings and aspirates are unacceptable for Xpert Xpress SARS-CoV-2/FLU/RSV testing.  Fact Sheet for Patients: bloggercourse.com  Fact Sheet for Healthcare Providers: seriousbroker.it  This test is not yet approved or cleared by the [place]  FDA and has been authorized for detection and/or diagnosis of SARS-CoV-2 by FDA under an Emergency Use Authorization (EUA). This EUA will remain in effect (meaning this test can be used) for the duration of the COVID-19 declaration under Section 564(b)(1) of the Act, 21 U.S.C. section 360bbb-3(b)(1), unless the authorization is terminated or revoked.     Resp Syncytial Virus by PCR NEGATIVE NEGATIVE Final    Comment: (NOTE) Fact Sheet for Patients: bloggercourse.com  Fact Sheet for Healthcare  Providers: seriousbroker.it  This test is not yet approved or cleared by the [place]  FDA and has been authorized for detection and/or diagnosis of SARS-CoV-2 by FDA under an  Emergency Use Authorization (EUA). This EUA will remain in effect (meaning this test can be used) for the duration of the COVID-19 declaration under Section 564(b)(1) of the Act, 21 U.S.C. section 360bbb-3(b)(1), unless the authorization is terminated or revoked.  Performed at [facility], [address], [place], [place] [address]   Blood Culture (routine x 2)     Status: None (Preliminary result)   Collection Time: 01/15/25  9:22 PM   Specimen: BLOOD  Result Value Ref Range Status   Specimen Description BLOOD BLOOD RIGHT HAND  Final   Special Requests   Final    BOTTLES DRAWN AEROBIC ONLY Blood Culture results may not be optimal due to an inadequate volume of blood received in culture bottles   Culture   Final    NO GROWTH < 12 HOURS Performed at [facility], [address], [place], [place] [address]    Report Status PENDING  Incomplete  Blood Culture (routine x 2)     Status: None (Preliminary result)   Collection Time: 01/15/25  9:27 PM   Specimen: BLOOD  Result Value Ref Range Status   Specimen Description BLOOD RIGHT ANTECUBITAL  Final   Special Requests   Final    BOTTLES DRAWN AEROBIC AND ANAEROBIC Blood Culture results may not be optimal due to an inadequate volume of blood received in culture bottles   Culture   Final    NO GROWTH < 12 HOURS Performed at [facility], [address] [place], [place] [address]    Report Status PENDING  Incomplete         Radiology Studies: HYBRID OR IMAGING ([facility] ONLY) Result Date: 01/16/2025 There is no interpretation for this exam.  This order is for images obtained during a surgical procedure.  Please See Surgeries Tab for more information regarding the procedure.   CT ANGIO CHEST AORTA W/CM & OR  WO/CM Result Date: 01/16/2025 CLINICAL DATA:  Chest pain, nonspecific concern for penetrating ulcer of pseudoaneurysm on MRI chest, Dr. Minus recommended CTA dissection protocol with contrast injected through RUE. EXAM: CT ANGIOGRAPHY CHEST WITH CONTRAST TECHNIQUE: Multidetector CT imaging of the chest was performed using the standard protocol during bolus administration of intravenous contrast. Multiplanar CT image reconstructions and MIPs were obtained to evaluate the vascular anatomy. RADIATION DOSE REDUCTION: This exam was performed according to the departmental dose-optimization program which includes automated exposure control, adjustment of the mA and/or kV according to patient size and/or use of iterative reconstruction technique. CONTRAST:  75mL OMNIPAQUE  IOHEXOL  350 MG/ML SOLN COMPARISON:  CT angiography chest from 04/2025 and MRI chest from 01/16/2025 FINDINGS: Cardiovascular: The ascending aorta is nondilated. However, redemonstration of irregular hyperdense collection in the anterosuperior mediastinal surrounding the origin of the innominate artery. There are approximately 3 small outpouchings arising at the level of origin of innominate artery. The largest of the outpouching measures up to 1.5 x 1.9 cm orthogonally on axial plane (series 6, image 34), which has significantly increased since the prior study from yesterday when the largest approaching measured up to 6 x 8 mm. This outpouching enhances slightly less than the aorta. There are other 2 adjacent smaller outpouchings which measure less than 1 cm in size (series 10, images 83 and 87). All the outpouchings exhibit relatively smooth saccular shapes. No discrete intimal calcifications noted. Findings favor pseudoaneurysms arising from the proximal innominate artery. Surrounding hyperattenuating area represent hematoma. Rest of the thoracic arteries are within normal limits, exhibiting mild-to-moderate peripheral  atherosclerotic vascular plaques.  No dissection. No imaging evidence of vasculitis. Mild cardiomegaly. No pericardial effusion. There are coronary artery calcifications, in keeping with coronary artery disease. Mediastinum/Nodes: Visualized thyroid  gland appears grossly unremarkable. There is anterosuperior mediastinal hematoma, as described above. The esophagus is nondistended precluding optimal assessment. Redemonstration of mildly enlarged mediastinal and bilateral hilar lymph nodes, nonspecific but favored benign/reactive. No axillary lymphadenopathy by size criteria. Lungs/Pleura: The central tracheo-bronchial tree is patent. There is mild, smooth, circumferential thickening of the segmental and subsegmental bronchial walls, throughout bilateral lungs, which is nonspecific. Findings are most commonly seen with pulmonary edema, bronchitis or reactive airway disease, such as asthma. There is minimal residual interlobular and intra lobular septal thickening, significantly improved since the prior study from yesterday. There are persistent bilateral small-to-moderate pleural effusions, right more than left with associated compressive atelectatic changes of the lung bases. Findings favor improving congestive heart failure/pulmonary edema. No suspicious mass or consolidation. No suspicious lung nodule. Upper Abdomen: Visualized upper abdominal viscera within normal limits. Surgically absent gallbladder. Musculoskeletal: The visualized soft tissues of the chest wall are grossly unremarkable. No suspicious osseous lesions. There are mild to moderate multilevel degenerative changes in the visualized spine. IMPRESSION: 1. Redemonstration of irregular hyperdense collection in the anterosuperior mediastinal surrounding the origin of the innominate artery. There are approximately 3 small out-pouchings arising at the level of origin of innominate artery. The largest of the outpouching measures up to 1.5 x 1.9 cm orthogonally on axial plane , which has  significantly increased since the prior study from yesterday. Findings favor pseudoaneurysms arising from the proximal innominate artery. Surrounding hyperattenuating area represent hematoma. Emergent cardiothoracic surgical consultation is recommended. 2. Findings favoring improving congestive heart failure/pulmonary edema. Aortic Atherosclerosis (ICD10-I70.0). Critical Value/emergent results were called by telephone at the time of interpretation on 01/16/2025 at 10:20 am to Dr. [place], Who verbally acknowledged these results. Electronically Signed   By: Ree Molt M.D.   On: 01/16/2025 10:21   MR CHEST W WO CONTRAST Result Date: 01/16/2025 CLINICAL DATA:  Soft tissue mass on CT scan earlier today. EXAM: MR CHEST WITH AND WITHOUT CONTRAST TECHNIQUE: Multiplanar, multisequence MR imaging of the chest was performed before and after the administration of intravenous contrast. CONTRAST:  10mL GADAVIST  GADOBUTROL  1 MMOL/ML IV SOLN COMPARISON:  Chest CTA from 1 day prior. FINDINGS: This is a markedly motion degraded study which significantly limits interpretive quality. Cardiovascular: Heart size is enlarged. No pericardial effusion. Ascending aorta measures up to 3.8 cm diameter. Anterior mediastinal soft tissue mass anterior to the brachiocephalic and left common carotid arteries measures approximately 4.3 x 3.1 x 4.0 cm and demonstrates central nodular enhancement that mimics blood pool on all pulse sequences after IV contrast administration. Axial T2 haste imaging shows this to be intimately associated with the anterior wall of the proximal brachiocephalic artery at the origin from the aorta and than tracking anterior to the brachiocephalic artery distally up behind the sternal notch. Pre contrast imaging shows intermediate to minimally increased signal in the lesion. After contrast, there appears to be a thin extension of contrast from the anterior wall of the brachiocephalic origin to the nodular enhancement  in the central aspect of the lesion (see axial postcontrast images 18-24 series 12). Review of the CTA from yesterday shows small bulge in contour along the anterior surface of the proximal brachiocephalic artery. This lesion was present but smaller on the study from 01/08/2025, but is new since a CTA neck of 07/18/2023. Imaging features raise distinct  concern for a large penetrating ulcer or pseudoaneurysm. Mediastinal/Lymph Nodes:Limited assessment due to motion artifact. No definite lymphadenopathy. Lungs: Dependent collapse/consolidation noted in the lung bases with small to moderate bilateral pleural effusions. MRI is unreliable for assessment of pulmonary parenchyma. Bones: No focal suspicious marrow enhancement within the visualized bony anatomy. IMPRESSION: 1. Markedly motion degraded study which significantly limits interpretive quality. 2. Soft tissue mass described anterior to the mediastinum on the prior imaging is anterior to the brachiocephalic and left common carotid arteries and shows central nodular enhancement that mimics blood pool on all pulse sequences after IV contrast administration. There appears to be a thin extension of contrast in the region of the brachiocephalic artery origin that extends up into the nodular enhancement in the central lesion as described above. Imaging features raise distinct concern for a large penetrating ulcer or pseudoaneurysm. Cardiothoracic/vascular surgery consult recommended. Repeat dedicated CTA of the chest with and without contrast timed for the aorta (aortic dissection protocol) would likely prove helpful to further evaluate. Further, I would recommend IV contrast be administered through the right upper extremity to limit potential artifact from contrast in the crossing left brachiocephalic vein at the time of scanning that could be present if the left upper extremity is used for contrast injection. 3. Dependent collapse/consolidation in the lung bases with  small to moderate bilateral pleural effusions. Critical Value/emergent results were called by telephone at the time of interpretation on 01/16/2025 at 5:38 am to provider TIMOTHY OPYD , who verbally acknowledged these results. Electronically Signed   By: Camellia Candle M.D.   On: 01/16/2025 05:38   CT Angio Chest PE W and/or Wo Contrast Result Date: 01/15/2025 EXAM: CTA CHEST PE WITHOUT AND WITH CONTRAST CT ABDOMEN AND PELVIS WITHOUT AND WITH CONTRAST 01/15/2025 11:15:20 PM TECHNIQUE: CTA of the chest was performed after the administration of intravenous contrast. Multiplanar reformatted images are provided for review. MIP images are provided for review. CT of the abdomen and pelvis was performed without and with the administration of intravenous contrast. Automated exposure control, iterative reconstruction, and/or weight based adjustment of the mA/kV was utilized to reduce the radiation dose to as low as reasonably achievable. COMPARISON: CT chest abdomen and pelvis 01/08/2025. CLINICAL HISTORY: Pulmonary embolism (PE) suspected, high probability. FINDINGS: CHEST: PULMONARY ARTERIES: Pulmonary arteries are adequately opacified for evaluation. No intraluminal filling defect to suggest pulmonary embolism. Main pulmonary artery is normal in caliber. MEDIASTINUM: Coronary and aortic atherosclerotic calcifications are noted. The heart is enlarged. There are enlarged subcarinal lymph nodes measuring up to 1 cm. Enlarged right hilar lymph nodes measure up to 1 cm. There is indeterminate density in the anterior mediastinum at the level of the distal ascending measuring 2.0 x 4.1 x 1.7 cm. This was not definitely seen on the prior study. Evaluation of this region is limited secondary to adjacent dense contrast causing streak artifact in the brachiocephalic vein. LUNGS AND PLEURA: There are small to moderate bilateral pleural effusions. There is smooth intralabular septal thickening in the bilateral lower lobes. There is  perihilar airspace opacity in the bilateral lower lobes with peribronchial wall thickening bilaterally centrally. No pneumothorax. SOFT TISSUES AND BONES: Degenerative changes affect the spine. No acute soft tissue abnormality. ABDOMEN AND PELVIS: LIVER: The liver is unremarkable. GALLBLADDER AND BILE DUCTS: Gallbladder is surgically absent. No biliary ductal dilatation. SPLEEN: Spleen demonstrates no acute abnormality. PANCREAS: Pancreas demonstrates no acute abnormality. ADRENAL GLANDS: Adrenal glands demonstrate no acute abnormality. KIDNEYS, URETERS AND BLADDER: No stones in the kidneys  or ureters. No hydronephrosis. No perinephric or periureteral stranding. There is a small amount of air in the bladder. GI AND BOWEL: Stomach and duodenal sweep demonstrate no acute abnormality. There is sigmoid colon diverticulosis. The appendix is surgically absent. There is no bowel obstruction. No abnormal bowel wall thickening or distension. REPRODUCTIVE: Uterus is surgically absent. PERITONEUM AND RETROPERITONEUM: There is new mild presacral edema and mild body wall edema. There is no ascites. No free air. LYMPH NODES: No lymphadenopathy. BONES AND SOFT TISSUES: There are atherosclerotic calcifications of the aorta and iliac arteries. Rim enhancing fluid collection is partially imaged in the subcutaneous tissues lateral to the right hip measuring 4.4 x 5.4 cm on image 2/109. No acute abnormality of the visualized bones. IMPRESSION: 1. No evidence of pulmonary embolism. 2. Findings most consistent with volume overload/pulmonary edema with small to moderate bilateral pleural effusions. 3. Indeterminate anterior mediastinal soft tissue density at the level of the distal ascending aorta measuring 2.0 x 4.1 x 1.7 cm, not definitely seen previously, with evaluation limited by streak artifact from adjacent dense contrast in the brachiocephalic vein. 4. Mildly enlarged subcarinal and right hilar lymph nodes measuring up to 1.0 cm.  5. Rim-enhancing fluid collection in the subcutaneous tissues lateral to the right hip measuring 4.4 x 5.4 cm, suspicious for abscess. 6. Small amount of air in the urinary bladder, which may be related to recent instrumentation or infection. Electronically signed by: Greig Pique MD 01/15/2025 11:30 PM EST RP Workstation: [id number]   CT ABDOMEN PELVIS W CONTRAST Result Date: 01/15/2025 EXAM: CTA CHEST PE WITHOUT AND WITH CONTRAST CT ABDOMEN AND PELVIS WITHOUT AND WITH CONTRAST 01/15/2025 11:15:20 PM TECHNIQUE: CTA of the chest was performed after the administration of intravenous contrast. Multiplanar reformatted images are provided for review. MIP images are provided for review. CT of the abdomen and pelvis was performed without and with the administration of intravenous contrast. Automated exposure control, iterative reconstruction, and/or weight based adjustment of the mA/kV was utilized to reduce the radiation dose to as low as reasonably achievable. COMPARISON: CT chest abdomen and pelvis 01/08/2025. CLINICAL HISTORY: Pulmonary embolism (PE) suspected, high probability. FINDINGS: CHEST: PULMONARY ARTERIES: Pulmonary arteries are adequately opacified for evaluation. No intraluminal filling defect to suggest pulmonary embolism. Main pulmonary artery is normal in caliber. MEDIASTINUM: Coronary and aortic atherosclerotic calcifications are noted. The heart is enlarged. There are enlarged subcarinal lymph nodes measuring up to 1 cm. Enlarged right hilar lymph nodes measure up to 1 cm. There is indeterminate density in the anterior mediastinum at the level of the distal ascending measuring 2.0 x 4.1 x 1.7 cm. This was not definitely seen on the prior study. Evaluation of this region is limited secondary to adjacent dense contrast causing streak artifact in the brachiocephalic vein. LUNGS AND PLEURA: There are small to moderate bilateral pleural effusions. There is smooth intralabular septal thickening in the  bilateral lower lobes. There is perihilar airspace opacity in the bilateral lower lobes with peribronchial wall thickening bilaterally centrally. No pneumothorax. SOFT TISSUES AND BONES: Degenerative changes affect the spine. No acute soft tissue abnormality. ABDOMEN AND PELVIS: LIVER: The liver is unremarkable. GALLBLADDER AND BILE DUCTS: Gallbladder is surgically absent. No biliary ductal dilatation. SPLEEN: Spleen demonstrates no acute abnormality. PANCREAS: Pancreas demonstrates no acute abnormality. ADRENAL GLANDS: Adrenal glands demonstrate no acute abnormality. KIDNEYS, URETERS AND BLADDER: No stones in the kidneys or ureters. No hydronephrosis. No perinephric or periureteral stranding. There is a small amount of air in the bladder. GI AND  BOWEL: Stomach and duodenal sweep demonstrate no acute abnormality. There is sigmoid colon diverticulosis. The appendix is surgically absent. There is no bowel obstruction. No abnormal bowel wall thickening or distension. REPRODUCTIVE: Uterus is surgically absent. PERITONEUM AND RETROPERITONEUM: There is new mild presacral edema and mild body wall edema. There is no ascites. No free air. LYMPH NODES: No lymphadenopathy. BONES AND SOFT TISSUES: There are atherosclerotic calcifications of the aorta and iliac arteries. Rim enhancing fluid collection is partially imaged in the subcutaneous tissues lateral to the right hip measuring 4.4 x 5.4 cm on image 2/109. No acute abnormality of the visualized bones. IMPRESSION: 1. No evidence of pulmonary embolism. 2. Findings most consistent with volume overload/pulmonary edema with small to moderate bilateral pleural effusions. 3. Indeterminate anterior mediastinal soft tissue density at the level of the distal ascending aorta measuring 2.0 x 4.1 x 1.7 cm, not definitely seen previously, with evaluation limited by streak artifact from adjacent dense contrast in the brachiocephalic vein. 4. Mildly enlarged subcarinal and right hilar  lymph nodes measuring up to 1.0 cm. 5. Rim-enhancing fluid collection in the subcutaneous tissues lateral to the right hip measuring 4.4 x 5.4 cm, suspicious for abscess. 6. Small amount of air in the urinary bladder, which may be related to recent instrumentation or infection. Electronically signed by: Greig Pique MD 01/15/2025 11:30 PM EST RP Workstation: [id number]   DG Chest Portable 1 View Result Date: 01/15/2025 EXAM: 1 VIEW(S) XRAY OF THE CHEST 01/15/2025 08:20:00 PM COMPARISON: 01/08/2025 CLINICAL HISTORY: Chest pain. FINDINGS: LUNGS AND PLEURA: Very mild perihilar/infrahilar edema is suspected. Probable trace bilateral pleural effusions. No pneumothorax. HEART AND MEDIASTINUM: Cardiomegaly. BONES AND SOFT TISSUES: No acute osseous abnormality. IMPRESSION: 1. Cardiomegaly with very mild perihilar pulmonary edema. 2. Probable trace bilateral pleural effusions. Electronically signed by: Pinkie Pebbles MD 01/15/2025 08:24 PM EST RP Workstation: [id number]        Scheduled Meds:  [FJM Hold] allopurinol   100 mg Oral Daily   [MAR Hold] furosemide   40 mg Intravenous BID   [MAR Hold] insulin  aspart  0-6 Units Subcutaneous Q4H   [MAR Hold] levothyroxine   50 mcg Oral QAC breakfast   [MAR Hold] pantoprazole   40 mg Oral q morning   [MAR Hold] sodium chloride  flush  3 mL Intravenous Q12H   [MAR Hold] zolpidem   5 mg Oral QHS   Continuous Infusions:  lactated ringers  10 mL/hr at 01/16/25 1232   [MAR Hold] vancomycin        LOS: 1 day    Time spent: 55 minutes.    Leatrice Chapel, MD  [facility] 7PM-7AM contact night coverage as above    "

## 2025-01-16 NOTE — Progress Notes (Signed)
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 IR will defer hip aspiration for today. Unfortunately, IR will not be able to accommodate over the weekend either, as IR operates on an on-call basis for emergent cases only. IR will re-attempt aspiration at top of the week next week. Care team advised and aware.   Electronically Signed: Carlin DELENA Griffon, PA-C 01/16/2025, 3:20 PM      "

## 2025-01-16 NOTE — Transfer of Care (Signed)
 Immediate Anesthesia Transfer of Care Note  Patient: LINSIE LUPO  Procedure(s) Performed: INSERTION OF INNOMINATE ARTERY STENT (Chest)  Patient Location: PACU  Anesthesia Type:General  Level of Consciousness: awake and alert   Airway & Oxygen Therapy: Patient Spontanous Breathing and Patient connected to nasal cannula oxygen  Post-op Assessment: Report given to RN, Post -op Vital signs reviewed and stable, Patient moving all extremities X 4, and Patient able to stick tongue midline  Post vital signs: Reviewed and stable  Last Vitals:  Vitals Value Taken Time  BP 128/68 01/16/25 17:24  Temp 37.1 C 01/16/25 17:28  Pulse 109 01/16/25 17:28  Resp 19 01/16/25 17:28  SpO2 96 % 01/16/25 17:28  Vitals shown include unfiled device data.  Last Pain:  Vitals:   01/16/25 1217  TempSrc: Oral  PainSc:          Complications: There were no known notable events for this encounter.

## 2025-01-16 NOTE — Anesthesia Procedure Notes (Signed)
 Arterial Line Insertion Start/End2/05/2025 1:00 PM, 01/16/2025 1:15 PM Performed by: Keneth Lynwood POUR, MD  Patient location: Pre-op. Preanesthetic checklist: patient identified, IV checked, site marked, risks and benefits discussed, surgical consent, monitors and equipment checked, pre-op evaluation, timeout performed and anesthesia consent Lidocaine  1% used for infiltration Left, radial was placed Catheter size: 20 G Hand hygiene performed  and maximum sterile barriers used   Attempts: 1 Procedure performed using ultrasound to evaluate access site. Following insertion, dressing applied. Post procedure assessment: normal and unchanged  Patient tolerated the procedure well with no immediate complications.

## 2025-02-20 ENCOUNTER — Ambulatory Visit: Admitting: Cardiology

## 2025-03-04 ENCOUNTER — Ambulatory Visit: Admitting: Internal Medicine

## 2025-06-19 ENCOUNTER — Ambulatory Visit

## 2025-07-22 ENCOUNTER — Encounter
